# Patient Record
Sex: Female | Born: 1935 | Race: White | Hispanic: No | Smoking: Former smoker
Health system: Southern US, Community
[De-identification: ages and names within clinical notes are randomized; demographics above are authoritative.]

## PROBLEM LIST (undated history)

## (undated) DIAGNOSIS — I82409 Acute embolism and thrombosis of unspecified deep veins of unspecified lower extremity: Secondary | ICD-10-CM

## (undated) DIAGNOSIS — I10 Essential (primary) hypertension: Secondary | ICD-10-CM

## (undated) DIAGNOSIS — J449 Chronic obstructive pulmonary disease, unspecified: Secondary | ICD-10-CM

## (undated) DIAGNOSIS — G2581 Restless legs syndrome: Secondary | ICD-10-CM

## (undated) DIAGNOSIS — G5600 Carpal tunnel syndrome, unspecified upper limb: Secondary | ICD-10-CM

## (undated) DIAGNOSIS — K219 Gastro-esophageal reflux disease without esophagitis: Secondary | ICD-10-CM

## (undated) DIAGNOSIS — E785 Hyperlipidemia, unspecified: Secondary | ICD-10-CM

## (undated) HISTORY — PX: ABDOMINAL HYSTERECTOMY: SHX81

## (undated) HISTORY — PX: GALLBLADDER SURGERY: SHX652

## (undated) HISTORY — DX: Essential (primary) hypertension: I10

## (undated) HISTORY — DX: Hyperlipidemia, unspecified: E78.5

---

## 2005-01-26 ENCOUNTER — Encounter (INDEPENDENT_AMBULATORY_CARE_PROVIDER_SITE_OTHER): Payer: Self-pay | Admitting: Specialist

## 2005-01-26 ENCOUNTER — Ambulatory Visit (HOSPITAL_COMMUNITY): Admission: RE | Admit: 2005-01-26 | Discharge: 2005-01-27 | Payer: Self-pay | Admitting: General Surgery

## 2005-10-06 ENCOUNTER — Ambulatory Visit (HOSPITAL_COMMUNITY): Admission: RE | Admit: 2005-10-06 | Discharge: 2005-10-06 | Payer: Self-pay | Admitting: Family Medicine

## 2006-03-29 ENCOUNTER — Encounter: Admission: RE | Admit: 2006-03-29 | Discharge: 2006-04-18 | Payer: Self-pay | Admitting: *Deleted

## 2006-10-07 ENCOUNTER — Other Ambulatory Visit: Admission: RE | Admit: 2006-10-07 | Discharge: 2006-10-07 | Payer: Self-pay | Admitting: *Deleted

## 2006-10-14 ENCOUNTER — Encounter: Admission: RE | Admit: 2006-10-14 | Discharge: 2006-10-14 | Payer: Self-pay | Admitting: *Deleted

## 2007-08-22 ENCOUNTER — Encounter: Admission: RE | Admit: 2007-08-22 | Discharge: 2007-08-22 | Payer: Self-pay | Admitting: Family Medicine

## 2007-10-31 ENCOUNTER — Encounter: Admission: RE | Admit: 2007-10-31 | Discharge: 2007-10-31 | Payer: Self-pay | Admitting: Family Medicine

## 2007-12-15 ENCOUNTER — Encounter: Admission: RE | Admit: 2007-12-15 | Discharge: 2007-12-15 | Payer: Self-pay | Admitting: Family Medicine

## 2008-02-06 ENCOUNTER — Encounter: Admission: RE | Admit: 2008-02-06 | Discharge: 2008-02-06 | Payer: Self-pay | Admitting: Family Medicine

## 2008-03-28 ENCOUNTER — Encounter: Admission: RE | Admit: 2008-03-28 | Discharge: 2008-03-28 | Payer: Self-pay | Admitting: Family Medicine

## 2008-08-16 ENCOUNTER — Encounter: Admission: RE | Admit: 2008-08-16 | Discharge: 2008-08-16 | Payer: Self-pay | Admitting: Family Medicine

## 2008-12-06 ENCOUNTER — Encounter: Admission: RE | Admit: 2008-12-06 | Discharge: 2008-12-06 | Payer: Self-pay | Admitting: Internal Medicine

## 2009-01-09 ENCOUNTER — Encounter: Admission: RE | Admit: 2009-01-09 | Discharge: 2009-01-09 | Payer: Self-pay | Admitting: Family Medicine

## 2010-01-21 ENCOUNTER — Encounter: Admission: RE | Admit: 2010-01-21 | Discharge: 2010-01-21 | Payer: Self-pay | Admitting: *Deleted

## 2010-03-06 ENCOUNTER — Encounter
Admission: RE | Admit: 2010-03-06 | Discharge: 2010-04-10 | Payer: Self-pay | Source: Home / Self Care | Admitting: Family Medicine

## 2010-09-25 NOTE — Op Note (Signed)
NAMEJODYE, SCALI                   ACCOUNT NO.:  [id number]   MEDICAL RECORD NO.:  [id number]          PATIENT TYPE:  OIB   LOCATION:  1604                         FACILITY:  [facility]   PHYSICIAN:  Anselm Pancoast. Weatherly, M.D.DATE OF BIRTH:  07/02/1921   DATE OF PROCEDURE:  01/27/2005  DATE OF DISCHARGE:                                 OPERATIVE REPORT   PREOPERATIVE DIAGNOSIS:  Chronic cholecystitis.   POSTOPERATIVE DIAGNOSIS:  Chronic cholecystitis with stones and also  intermittent volvulus of her gallbladder.   OPERATIONS:  Laparoscopic cholecystectomy with cholangiogram.   SURGEON:  Anselm Pancoast. Zachery Dakins, M.D.   ASSISTANT:  Gabrielle Dare. Janee Morn, M.D.   ANESTHESIA:  General anesthesia.   HISTORY OF PRESENT ILLNESS:  Cassidy Ellison is an 77 year old female, retired  Diplomatic Services operational officer from [employer], she used to be the Sport and exercise psychologist.  I  saw her in the office recently with the following history. She has had  intermittent episodes of what she describes significant right upper quadrant  pain, something would kind of flop over according to her symptoms and these  will then kind of gradually subside.  She has had a hysterectomy years ago,  and she has had recurrent episodes but over the last couple of weeks the  pain has been much more frequent and the worst attack was approximately two  weeks ago when approximately at 1 a.m. she had significant pain.  She was  seen by the physicians and thought possibly to have a kidney stone.  She has  had a kidney stone in the past but the ureterogram showed no evidence of any  abnormalities, but it did show multiple stones within her gallbladder.  There was no ductal dilatation, and for this reason she was sent to my  office.  I saw on January 19, 2005, and at that time she describes the  pain as king of a bloating, kind of a flopping of something in the right  upper quadrant, and then the symptoms would kind of gradually subside, and I  was  thinking that she was having gallbladder though she describes this as  kind of a flopping over, twisting sensation.  Preoperatively her liver  function studies were normal, and I scheduled her for surgery and they had  her scheduled for today.  She has had significant problems with nausea but  she has not actually lost any significant amount of weight.  Her laboratory  studies showed white count of 7700.  Her hematocrit was 39, and her liver  function studies were normal.  The patient was taken to the operative suite.  She was scheduled earlier today because of acute appendicitis so I postponed  her surgery slightly.   DESCRIPTION OF PROCEDURE:  She was given 3 g of Unasyn approximately two  hours earlier when she was originally in the OR schedule time.  She has PAS  stockings, position OR table. Induction general anesthesia and the abdomen  prepped with Betadine surgical scrub solution and draped in a sterile  manner.  A small incision was made below the umbilicus and sharp  dissection  down through the skin and subcutaneous tissue.  She is a little thicker than  she actually grossly appears, and the fascia was picked up between two  Kochers and a small opening made.  I used a Tresa Endo go into the peritoneal  cavity.  The Hassan cannula was introduced after a pursestring suture was  placed.  On looking up in the upper abdomen, there was  real white area of  the liver and you really could not see the gallbladder, but the omentum was  adherent over where the gallbladder should be.  There were adhesions in the  right lateral area where she had previous surgery, and I first placed the  upper 10 mm trocar in the subxiphoid area and then we placed one 5 mm  trocar.  Then with these I dropped down the omentum that was very adherent  to the right upper quadrant area so we could get the lateral 5 mL port in.  We then basically were kind of lifting up the liver edge looking for the  gallbladder and  could see this large, fluid-filled something with the  omentum adherent to it, but it did not look like was actually attached to  the liver edge.  Then after were stripping the omentum off of this, we saw  that this definitely is the gallbladder, but appears that actually she has  been getting kind of a volvulus of the gallbladder where it is extremely  distended and is kind of stretched out over the cystic duct and everything.  After this was all straightened out so we could see the anatomy, we could  see the cystic duct and encompassed it with a right angle and then placed a  clip across the distal portion junction of the gallbladder and then a Cook  catheter introduced,  x-ray obtained.  It had a good prompt fill in the  extrahepatic biliary system and good flow into the duodenum.  The catheters  were then removed, and the cystic duct was doubly clipped proximally and  then divided, and then the cystic artery was likewise doubly clipped  proximally, singly distally divided.  It looked like a little mesentery to  the gallbladder.  This was all kind of carefully dissected free, anything  that looked like it could have blood vessels and these were clipped, and  then it was freed up completely, freeing the gallbladder.  The gallbladder  was placed in the EndoCatch bag and then we brought the gallbladder out  through the fascia at the umbilicus.  I then reinserted the Hasson cannula,  and we thoroughly irrigated, aspirated and checked it.  There was no  evidence of any bleeding.  The irrigating fluid was aspirated, and then we  switched the camera to the upper 10 mL port and took two figure-of-eight  sutures in the fascia at the umbilicus, and then anesthetized fascia with  Marcaine.  The 5 mm ports were withdrawn under direct vision, and then the  subcutaneous wounds were closed with 4-0 Vicryl and Benzoin and Steri-Strips  on skin.  We had released the carbon dioxide and withdrew the camera  and withdrew the upper 10 liters port and direct vision.  The patient tolerated  procedure nicely, and we kept her overnight and then released in the a.m.  Hopefully she will have no further episodes of this pain which I think was  probably more of a volvulus type thing of the gallbladder than actually the  typical biliary  colic symptoms.           ______________________________  Anselm Pancoast. Zachery Dakins, M.D.     WJW/MEDQ  D:  01/26/2005  T:  01/27/2005  Job:  [id number]   cc:   Clent Jacks, M.D.  [facility]

## 2011-01-15 ENCOUNTER — Other Ambulatory Visit: Payer: Self-pay | Admitting: Family Medicine

## 2011-01-15 DIAGNOSIS — Z1231 Encounter for screening mammogram for malignant neoplasm of breast: Secondary | ICD-10-CM

## 2011-01-26 ENCOUNTER — Ambulatory Visit: Payer: Self-pay

## 2011-02-01 ENCOUNTER — Ambulatory Visit
Admission: RE | Admit: 2011-02-01 | Discharge: 2011-02-01 | Disposition: A | Discharge status: Home / Self Care | Payer: Medicare Other | Source: Ambulatory Visit | Attending: Family Medicine | Admitting: Family Medicine

## 2011-02-01 DIAGNOSIS — Z1231 Encounter for screening mammogram for malignant neoplasm of breast: Secondary | ICD-10-CM

## 2011-05-26 DIAGNOSIS — E559 Vitamin D deficiency, unspecified: Secondary | ICD-10-CM | POA: Diagnosis not present

## 2011-05-26 DIAGNOSIS — K589 Irritable bowel syndrome without diarrhea: Secondary | ICD-10-CM | POA: Diagnosis not present

## 2011-05-26 DIAGNOSIS — E782 Mixed hyperlipidemia: Secondary | ICD-10-CM | POA: Diagnosis not present

## 2011-05-26 DIAGNOSIS — I10 Essential (primary) hypertension: Secondary | ICD-10-CM | POA: Diagnosis not present

## 2011-05-26 DIAGNOSIS — Z79899 Other long term (current) drug therapy: Secondary | ICD-10-CM | POA: Diagnosis not present

## 2011-05-26 DIAGNOSIS — K219 Gastro-esophageal reflux disease without esophagitis: Secondary | ICD-10-CM | POA: Diagnosis not present

## 2011-06-30 DIAGNOSIS — Q828 Other specified congenital malformations of skin: Secondary | ICD-10-CM | POA: Diagnosis not present

## 2011-06-30 DIAGNOSIS — B351 Tinea unguium: Secondary | ICD-10-CM | POA: Diagnosis not present

## 2011-06-30 DIAGNOSIS — M79609 Pain in unspecified limb: Secondary | ICD-10-CM | POA: Diagnosis not present

## 2011-09-07 DIAGNOSIS — B351 Tinea unguium: Secondary | ICD-10-CM | POA: Diagnosis not present

## 2011-09-07 DIAGNOSIS — M79609 Pain in unspecified limb: Secondary | ICD-10-CM | POA: Diagnosis not present

## 2011-09-30 DIAGNOSIS — H01029 Squamous blepharitis unspecified eye, unspecified eyelid: Secondary | ICD-10-CM | POA: Diagnosis not present

## 2011-11-24 DIAGNOSIS — I10 Essential (primary) hypertension: Secondary | ICD-10-CM | POA: Diagnosis not present

## 2011-11-24 DIAGNOSIS — R197 Diarrhea, unspecified: Secondary | ICD-10-CM | POA: Diagnosis not present

## 2011-11-24 DIAGNOSIS — E782 Mixed hyperlipidemia: Secondary | ICD-10-CM | POA: Diagnosis not present

## 2011-11-24 DIAGNOSIS — R319 Hematuria, unspecified: Secondary | ICD-10-CM | POA: Diagnosis not present

## 2011-12-02 DIAGNOSIS — B351 Tinea unguium: Secondary | ICD-10-CM | POA: Diagnosis not present

## 2011-12-02 DIAGNOSIS — M79609 Pain in unspecified limb: Secondary | ICD-10-CM | POA: Diagnosis not present

## 2011-12-28 DIAGNOSIS — T8189XA Other complications of procedures, not elsewhere classified, initial encounter: Secondary | ICD-10-CM | POA: Diagnosis not present

## 2012-02-09 ENCOUNTER — Other Ambulatory Visit (HOSPITAL_COMMUNITY): Payer: Self-pay | Admitting: Family Medicine

## 2012-02-09 DIAGNOSIS — Z1231 Encounter for screening mammogram for malignant neoplasm of breast: Secondary | ICD-10-CM

## 2012-02-22 ENCOUNTER — Ambulatory Visit (HOSPITAL_COMMUNITY): Payer: Medicare Other

## 2012-03-08 ENCOUNTER — Ambulatory Visit (HOSPITAL_COMMUNITY)
Admission: RE | Admit: 2012-03-08 | Discharge: 2012-03-08 | Disposition: A | Discharge status: Home / Self Care | Payer: Medicare Other | Source: Ambulatory Visit | Attending: Family Medicine | Admitting: Family Medicine

## 2012-03-08 DIAGNOSIS — Z1231 Encounter for screening mammogram for malignant neoplasm of breast: Secondary | ICD-10-CM | POA: Insufficient documentation

## 2012-03-17 DIAGNOSIS — B351 Tinea unguium: Secondary | ICD-10-CM | POA: Diagnosis not present

## 2012-03-17 DIAGNOSIS — M79609 Pain in unspecified limb: Secondary | ICD-10-CM | POA: Diagnosis not present

## 2012-03-21 DIAGNOSIS — Z23 Encounter for immunization: Secondary | ICD-10-CM | POA: Diagnosis not present

## 2012-04-03 DIAGNOSIS — L408 Other psoriasis: Secondary | ICD-10-CM | POA: Diagnosis not present

## 2012-04-03 DIAGNOSIS — L821 Other seborrheic keratosis: Secondary | ICD-10-CM | POA: Diagnosis not present

## 2012-04-25 DIAGNOSIS — Z79899 Other long term (current) drug therapy: Secondary | ICD-10-CM | POA: Diagnosis not present

## 2012-04-25 DIAGNOSIS — I10 Essential (primary) hypertension: Secondary | ICD-10-CM | POA: Diagnosis not present

## 2012-04-25 DIAGNOSIS — E559 Vitamin D deficiency, unspecified: Secondary | ICD-10-CM | POA: Diagnosis not present

## 2012-04-25 DIAGNOSIS — L659 Nonscarring hair loss, unspecified: Secondary | ICD-10-CM | POA: Diagnosis not present

## 2012-04-25 DIAGNOSIS — K589 Irritable bowel syndrome without diarrhea: Secondary | ICD-10-CM | POA: Diagnosis not present

## 2012-04-25 DIAGNOSIS — Z23 Encounter for immunization: Secondary | ICD-10-CM | POA: Diagnosis not present

## 2012-04-25 DIAGNOSIS — E785 Hyperlipidemia, unspecified: Secondary | ICD-10-CM | POA: Diagnosis not present

## 2012-08-09 DIAGNOSIS — M79609 Pain in unspecified limb: Secondary | ICD-10-CM | POA: Diagnosis not present

## 2012-08-09 DIAGNOSIS — B351 Tinea unguium: Secondary | ICD-10-CM | POA: Diagnosis not present

## 2012-10-04 DIAGNOSIS — T8189XA Other complications of procedures, not elsewhere classified, initial encounter: Secondary | ICD-10-CM | POA: Diagnosis not present

## 2012-10-04 DIAGNOSIS — E559 Vitamin D deficiency, unspecified: Secondary | ICD-10-CM | POA: Diagnosis not present

## 2012-10-04 DIAGNOSIS — G459 Transient cerebral ischemic attack, unspecified: Secondary | ICD-10-CM | POA: Diagnosis not present

## 2012-10-04 DIAGNOSIS — E782 Mixed hyperlipidemia: Secondary | ICD-10-CM | POA: Diagnosis not present

## 2012-10-04 DIAGNOSIS — I10 Essential (primary) hypertension: Secondary | ICD-10-CM | POA: Diagnosis not present

## 2012-10-04 DIAGNOSIS — Z79899 Other long term (current) drug therapy: Secondary | ICD-10-CM | POA: Diagnosis not present

## 2012-10-04 DIAGNOSIS — R82998 Other abnormal findings in urine: Secondary | ICD-10-CM | POA: Diagnosis not present

## 2012-10-09 DIAGNOSIS — I789 Disease of capillaries, unspecified: Secondary | ICD-10-CM | POA: Diagnosis not present

## 2012-10-09 DIAGNOSIS — L408 Other psoriasis: Secondary | ICD-10-CM | POA: Diagnosis not present

## 2012-10-09 DIAGNOSIS — L821 Other seborrheic keratosis: Secondary | ICD-10-CM | POA: Diagnosis not present

## 2012-10-09 DIAGNOSIS — L820 Inflamed seborrheic keratosis: Secondary | ICD-10-CM | POA: Diagnosis not present

## 2012-10-17 DIAGNOSIS — N390 Urinary tract infection, site not specified: Secondary | ICD-10-CM | POA: Diagnosis not present

## 2012-11-02 DIAGNOSIS — H04129 Dry eye syndrome of unspecified lacrimal gland: Secondary | ICD-10-CM | POA: Diagnosis not present

## 2012-11-22 DIAGNOSIS — M79609 Pain in unspecified limb: Secondary | ICD-10-CM | POA: Diagnosis not present

## 2012-11-22 DIAGNOSIS — B351 Tinea unguium: Secondary | ICD-10-CM | POA: Diagnosis not present

## 2013-02-07 ENCOUNTER — Other Ambulatory Visit: Payer: Self-pay | Admitting: Family Medicine

## 2013-02-07 DIAGNOSIS — Z1231 Encounter for screening mammogram for malignant neoplasm of breast: Secondary | ICD-10-CM

## 2013-02-07 DIAGNOSIS — E782 Mixed hyperlipidemia: Secondary | ICD-10-CM | POA: Diagnosis not present

## 2013-02-07 DIAGNOSIS — Z23 Encounter for immunization: Secondary | ICD-10-CM | POA: Diagnosis not present

## 2013-02-07 DIAGNOSIS — Z79899 Other long term (current) drug therapy: Secondary | ICD-10-CM | POA: Diagnosis not present

## 2013-02-07 DIAGNOSIS — M8580 Other specified disorders of bone density and structure, unspecified site: Secondary | ICD-10-CM

## 2013-02-07 DIAGNOSIS — I10 Essential (primary) hypertension: Secondary | ICD-10-CM | POA: Diagnosis not present

## 2013-02-07 DIAGNOSIS — M79609 Pain in unspecified limb: Secondary | ICD-10-CM | POA: Diagnosis not present

## 2013-02-16 DIAGNOSIS — M205X9 Other deformities of toe(s) (acquired), unspecified foot: Secondary | ICD-10-CM | POA: Diagnosis not present

## 2013-03-02 ENCOUNTER — Encounter: Payer: Self-pay | Admitting: Podiatrist

## 2013-03-02 ENCOUNTER — Ambulatory Visit (INDEPENDENT_AMBULATORY_CARE_PROVIDER_SITE_OTHER): Payer: Medicare Other | Admitting: Podiatrist

## 2013-03-02 VITALS — BP 107/63 | HR 69 | Resp 12 | Ht 65.0 in | Wt 140.0 lb

## 2013-03-02 DIAGNOSIS — M2040 Other hammer toe(s) (acquired), unspecified foot: Secondary | ICD-10-CM

## 2013-03-02 DIAGNOSIS — M79609 Pain in unspecified limb: Secondary | ICD-10-CM

## 2013-03-02 DIAGNOSIS — R52 Pain, unspecified: Secondary | ICD-10-CM

## 2013-03-02 DIAGNOSIS — B351 Tinea unguium: Secondary | ICD-10-CM | POA: Diagnosis not present

## 2013-03-02 DIAGNOSIS — M2042 Other hammer toe(s) (acquired), left foot: Secondary | ICD-10-CM

## 2013-03-02 NOTE — Progress Notes (Signed)
HPI:  Patient presents today for follow up of foot and nail care. Denies any new complaints today.  Objective:  Patients chart is reviewed.  Neurovascular status unchanged.  Patients nails are thickened, discolored, distrophic, friable and brittle with yellow-brown discoloration. Patient subjectively relates they are painful with shoes and with ambulation.both feet.  Patient has rigidly contracted hammertoe 2nd digit with bunion deformity causing hallux to underlap the 2nd toe  Assessment:  Symptomatic onychomycosis, hammertoe 2nd right   Plan:  Discussed treatment options and alternatives.  Discussed surgery for the second toe on the left foot. In the past we did discuss amputation of the second toe as to completely realign the toe would require a large surgery versus a simple amputation. Also discussed her trying to get some shoes with some flexible,cushioning material on the upper and recommended that she see the folks at the shoe market to see if they can find her shoes are comfortable. May discuss surgical options at next visit if she is interested   The symptomatic toenails were debrided through manual an mechanical means without complication.  Return appointment recommended at routine intervals of 3 months  Marlowe Aschoff DPM

## 2013-03-02 NOTE — Patient Instructions (Signed)
GENERAL FOOT HEALTH INFORMATION:  Moisturize your feet regularly with a cream based lotion such as Cetaphil (Cream) or Eucerin (Cream)- usually available in a tub or crock type of container.  Avoid applying the cream to the toe interspaces themselves to reduce the risk of a fungal infection between the toes.  After showering or bathing be sure to dry well between your toes.    Wear a good fitting shoe-  Running shoe brands I recommend are Brooks, New Balance and Asiics.  Always have a shoe fit specialist help you choose your shoes as there are many "varieties" of shoes and they can find you the best fit.  [employer] sports/ [employer] ([place], [place], [place]), [employer], [employer] ([place]) have trained staff to help you in this process.  The Shoe Market in [place] has a great selection of euro-comfort casual shoes with good comfort and support as well.  Avoid prolonged use of thin ballet type flats, or flip flops.  Everyday use of these shoes can actually cause foot problems or injuries.  Watch your toenails for any signs of infection including drainage, pus redness or swelling along the sides of the toenails.  Soak in epsom salt water and use antibiotic ointment (OTC) if you notice this start to occur.  If the redness does not resolve within 2-3 days, call for an appointment to be seen.  

## 2013-03-22 ENCOUNTER — Ambulatory Visit
Admission: RE | Admit: 2013-03-22 | Discharge: 2013-03-22 | Disposition: A | Discharge status: Home / Self Care | Payer: Medicare Other | Source: Ambulatory Visit | Attending: Family Medicine | Admitting: Family Medicine

## 2013-03-22 DIAGNOSIS — M8580 Other specified disorders of bone density and structure, unspecified site: Secondary | ICD-10-CM

## 2013-03-22 DIAGNOSIS — Z1231 Encounter for screening mammogram for malignant neoplasm of breast: Secondary | ICD-10-CM | POA: Diagnosis not present

## 2013-03-22 DIAGNOSIS — M899 Disorder of bone, unspecified: Secondary | ICD-10-CM | POA: Diagnosis not present

## 2013-05-11 DIAGNOSIS — I10 Essential (primary) hypertension: Secondary | ICD-10-CM | POA: Diagnosis not present

## 2013-05-11 DIAGNOSIS — M2550 Pain in unspecified joint: Secondary | ICD-10-CM | POA: Diagnosis not present

## 2013-05-11 DIAGNOSIS — Z79899 Other long term (current) drug therapy: Secondary | ICD-10-CM | POA: Diagnosis not present

## 2013-05-11 DIAGNOSIS — E782 Mixed hyperlipidemia: Secondary | ICD-10-CM | POA: Diagnosis not present

## 2013-05-11 DIAGNOSIS — M899 Disorder of bone, unspecified: Secondary | ICD-10-CM | POA: Diagnosis not present

## 2013-05-11 DIAGNOSIS — M949 Disorder of cartilage, unspecified: Secondary | ICD-10-CM | POA: Diagnosis not present

## 2013-05-11 DIAGNOSIS — E559 Vitamin D deficiency, unspecified: Secondary | ICD-10-CM | POA: Diagnosis not present

## 2013-06-06 ENCOUNTER — Ambulatory Visit (INDEPENDENT_AMBULATORY_CARE_PROVIDER_SITE_OTHER): Payer: Medicare Other | Admitting: Podiatrist

## 2013-06-06 ENCOUNTER — Encounter: Payer: Self-pay | Admitting: Podiatrist

## 2013-06-06 VITALS — BP 103/57 | HR 76 | Resp 16

## 2013-06-06 DIAGNOSIS — M79609 Pain in unspecified limb: Secondary | ICD-10-CM

## 2013-06-06 DIAGNOSIS — B351 Tinea unguium: Secondary | ICD-10-CM | POA: Diagnosis not present

## 2013-06-06 NOTE — Patient Instructions (Signed)
[employer] -- the website and catalog with lots of good foot care products and shoes-- look for shoes with mesh on the upper for the left 2nd tod

## 2013-06-09 NOTE — Progress Notes (Signed)
HPI: Cassidy Ellison presents today for follow up of foot and nail care. Denies any new complaints today.   Objective: Patients chart is reviewed. Neurovascular status unchanged- pedal pulses are palbable at 2/4 dp and pt bilateral.  Patients nails are thickened, discolored, distrophic, friable and brittle with yellow-brown discoloration. Patient subjectively relates they are painful with shoes and with ambulation.both feet. Patient has rigidly contracted hammertoe 2nd digit with bunion deformity causing hallux to underlap the 2nd toe which continues to be painful   Assessment: Symptomatic onychomycosis, hammertoe 2nd right, severe clinical bunion deformity right.  Plan: Discussed treatment options and alternatives. The symptomatic toenails were debrided through manual an mechanical means without complication. Return appointment recommended at routine intervals of 3 months Reiterated the need for cushioning and accomdative shoes for her bunion and 2nd toe.  At this time these issues are stable.

## 2013-08-22 DIAGNOSIS — G479 Sleep disorder, unspecified: Secondary | ICD-10-CM | POA: Diagnosis not present

## 2013-08-22 DIAGNOSIS — I10 Essential (primary) hypertension: Secondary | ICD-10-CM | POA: Diagnosis not present

## 2013-08-22 DIAGNOSIS — R32 Unspecified urinary incontinence: Secondary | ICD-10-CM | POA: Diagnosis not present

## 2013-08-22 DIAGNOSIS — E782 Mixed hyperlipidemia: Secondary | ICD-10-CM | POA: Diagnosis not present

## 2013-08-22 DIAGNOSIS — Z79899 Other long term (current) drug therapy: Secondary | ICD-10-CM | POA: Diagnosis not present

## 2013-08-22 DIAGNOSIS — G459 Transient cerebral ischemic attack, unspecified: Secondary | ICD-10-CM | POA: Diagnosis not present

## 2013-08-22 DIAGNOSIS — N390 Urinary tract infection, site not specified: Secondary | ICD-10-CM | POA: Diagnosis not present

## 2013-09-04 DIAGNOSIS — N390 Urinary tract infection, site not specified: Secondary | ICD-10-CM | POA: Diagnosis not present

## 2013-09-06 ENCOUNTER — Ambulatory Visit: Payer: Medicare Other | Admitting: Podiatrist

## 2013-09-14 ENCOUNTER — Ambulatory Visit (INDEPENDENT_AMBULATORY_CARE_PROVIDER_SITE_OTHER): Payer: Medicare Other | Admitting: Podiatrist

## 2013-09-14 ENCOUNTER — Encounter: Payer: Self-pay | Admitting: Podiatrist

## 2013-09-14 VITALS — BP 132/67 | HR 66 | Resp 18

## 2013-09-14 DIAGNOSIS — B351 Tinea unguium: Secondary | ICD-10-CM | POA: Diagnosis not present

## 2013-09-14 DIAGNOSIS — M79609 Pain in unspecified limb: Secondary | ICD-10-CM | POA: Diagnosis not present

## 2013-09-14 NOTE — Progress Notes (Signed)
I am here to get my toenails trimmed up and I need my calluses trimmed  HPI:  Patient presents today for follow up of foot and nail care. Denies any new complaints today.  Objective:  Patients chart is reviewed.  Vascular status reveals pedal pulses noted at 2 out of 4 dp and pt bilateral .  Neurological sensation is Normal to [employer] monofilament bilateral.  Patients nails are thickened, discolored, distrophic, friable and brittle with yellow-brown discoloration. Patient subjectively relates they are painful with shoes and with ambulation of bilateral feet.  Assessment:  Symptomatic onychomycosis  Plan:  Discussed treatment options and alternatives.  The symptomatic toenails were debrided through manual an mechanical means without complication.  Return appointment recommended at routine intervals of 3 months    Trudie Buckler, DPM

## 2013-09-21 DIAGNOSIS — N390 Urinary tract infection, site not specified: Secondary | ICD-10-CM | POA: Diagnosis not present

## 2013-12-13 ENCOUNTER — Ambulatory Visit (INDEPENDENT_AMBULATORY_CARE_PROVIDER_SITE_OTHER): Payer: Medicare Other | Admitting: Podiatrist

## 2013-12-13 DIAGNOSIS — M79609 Pain in unspecified limb: Secondary | ICD-10-CM | POA: Diagnosis not present

## 2013-12-13 DIAGNOSIS — B351 Tinea unguium: Secondary | ICD-10-CM | POA: Diagnosis not present

## 2013-12-13 DIAGNOSIS — M79673 Pain in unspecified foot: Secondary | ICD-10-CM

## 2013-12-13 NOTE — Progress Notes (Signed)

## 2014-01-04 DIAGNOSIS — I10 Essential (primary) hypertension: Secondary | ICD-10-CM | POA: Diagnosis not present

## 2014-01-04 DIAGNOSIS — R350 Frequency of micturition: Secondary | ICD-10-CM | POA: Diagnosis not present

## 2014-01-04 DIAGNOSIS — E559 Vitamin D deficiency, unspecified: Secondary | ICD-10-CM | POA: Diagnosis not present

## 2014-01-04 DIAGNOSIS — E785 Hyperlipidemia, unspecified: Secondary | ICD-10-CM | POA: Diagnosis not present

## 2014-01-04 DIAGNOSIS — Z8673 Personal history of transient ischemic attack (TIA), and cerebral infarction without residual deficits: Secondary | ICD-10-CM | POA: Diagnosis not present

## 2014-01-04 DIAGNOSIS — K219 Gastro-esophageal reflux disease without esophagitis: Secondary | ICD-10-CM | POA: Diagnosis not present

## 2014-01-04 DIAGNOSIS — M19049 Primary osteoarthritis, unspecified hand: Secondary | ICD-10-CM | POA: Diagnosis not present

## 2014-01-04 DIAGNOSIS — R209 Unspecified disturbances of skin sensation: Secondary | ICD-10-CM | POA: Diagnosis not present

## 2014-01-21 ENCOUNTER — Other Ambulatory Visit: Payer: Self-pay | Admitting: Dermatology

## 2014-01-21 DIAGNOSIS — C44319 Basal cell carcinoma of skin of other parts of face: Secondary | ICD-10-CM | POA: Diagnosis not present

## 2014-01-21 DIAGNOSIS — L408 Other psoriasis: Secondary | ICD-10-CM | POA: Diagnosis not present

## 2014-01-21 DIAGNOSIS — L218 Other seborrheic dermatitis: Secondary | ICD-10-CM | POA: Diagnosis not present

## 2014-01-21 DIAGNOSIS — L821 Other seborrheic keratosis: Secondary | ICD-10-CM | POA: Diagnosis not present

## 2014-01-21 DIAGNOSIS — L570 Actinic keratosis: Secondary | ICD-10-CM | POA: Diagnosis not present

## 2014-01-21 DIAGNOSIS — D485 Neoplasm of uncertain behavior of skin: Secondary | ICD-10-CM | POA: Diagnosis not present

## 2014-01-30 DIAGNOSIS — H43819 Vitreous degeneration, unspecified eye: Secondary | ICD-10-CM | POA: Diagnosis not present

## 2014-01-30 DIAGNOSIS — Z961 Presence of intraocular lens: Secondary | ICD-10-CM | POA: Diagnosis not present

## 2014-01-30 DIAGNOSIS — H00029 Hordeolum internum unspecified eye, unspecified eyelid: Secondary | ICD-10-CM | POA: Diagnosis not present

## 2014-02-14 ENCOUNTER — Ambulatory Visit: Payer: Medicare Other | Admitting: Podiatrist

## 2014-02-22 ENCOUNTER — Ambulatory Visit (INDEPENDENT_AMBULATORY_CARE_PROVIDER_SITE_OTHER): Payer: Medicare Other | Admitting: Podiatrist

## 2014-02-22 DIAGNOSIS — B351 Tinea unguium: Secondary | ICD-10-CM | POA: Diagnosis not present

## 2014-02-22 DIAGNOSIS — M79676 Pain in unspecified toe(s): Secondary | ICD-10-CM

## 2014-02-25 NOTE — Progress Notes (Signed)
HPI:  Patient presents today for follow up of foot and nail care. Denies any new complaints today.  Objective:  Patients chart is reviewed.  Vascular status reveals pedal pulses noted at 2 out of 4 dp and pt bilateral .  Neurological sensation is intact to [employer] monofilament bilateral.  Patients nails are thickened, discolored, distrophic, friable and brittle with yellow-brown discoloration. Patient subjectively relates they are painful with shoes and with ambulation of bilateral feet.  Assessment:  Symptomatic onychomycosis  Plan:  Discussed treatment options and alternatives.  The symptomatic toenails were debrided through manual an mechanical means without complication.  Return appointment recommended at routine intervals of 3 months

## 2014-03-29 DIAGNOSIS — Z23 Encounter for immunization: Secondary | ICD-10-CM | POA: Diagnosis not present

## 2014-04-11 ENCOUNTER — Other Ambulatory Visit: Payer: Self-pay

## 2014-04-11 DIAGNOSIS — Z1231 Encounter for screening mammogram for malignant neoplasm of breast: Secondary | ICD-10-CM

## 2014-04-25 ENCOUNTER — Ambulatory Visit
Admission: RE | Admit: 2014-04-25 | Discharge: 2014-04-25 | Disposition: A | Discharge status: Home / Self Care | Payer: Medicare Other | Source: Ambulatory Visit

## 2014-04-25 DIAGNOSIS — Z1231 Encounter for screening mammogram for malignant neoplasm of breast: Secondary | ICD-10-CM

## 2014-04-26 ENCOUNTER — Ambulatory Visit: Payer: Medicare Other | Admitting: Podiatrist

## 2014-05-15 ENCOUNTER — Ambulatory Visit (INDEPENDENT_AMBULATORY_CARE_PROVIDER_SITE_OTHER): Payer: Medicare Other | Admitting: Podiatrist

## 2014-05-15 ENCOUNTER — Encounter: Payer: Self-pay | Admitting: Podiatrist

## 2014-05-15 DIAGNOSIS — B351 Tinea unguium: Secondary | ICD-10-CM

## 2014-05-15 DIAGNOSIS — M79676 Pain in unspecified toe(s): Secondary | ICD-10-CM | POA: Diagnosis not present

## 2014-05-15 NOTE — Progress Notes (Signed)
HPI:  Patient presents today for follow up of foot and nail care. Denies any new complaints today.  Objective:  Patients chart is reviewed.  Vascular status reveals pedal pulses noted at 2 out of 4 dp and pt bilateral .  Neurological sensation is intact to [employer] monofilament bilateral.  Patients nails are thickened, discolored, distrophic, friable and brittle with yellow-brown discoloration. Patient subjectively relates they are painful with shoes and with ambulation of bilateral feet.  Assessment:  Symptomatic onychomycosis  Plan:  Discussed treatment options and alternatives.  The symptomatic toenails were debrided through manual an mechanical means without complication.  Return appointment recommended at routine intervals of 3 months

## 2014-07-09 DIAGNOSIS — E782 Mixed hyperlipidemia: Secondary | ICD-10-CM | POA: Diagnosis not present

## 2014-07-09 DIAGNOSIS — Z79899 Other long term (current) drug therapy: Secondary | ICD-10-CM | POA: Diagnosis not present

## 2014-07-09 DIAGNOSIS — Z8673 Personal history of transient ischemic attack (TIA), and cerebral infarction without residual deficits: Secondary | ICD-10-CM | POA: Diagnosis not present

## 2014-07-09 DIAGNOSIS — R200 Anesthesia of skin: Secondary | ICD-10-CM | POA: Diagnosis not present

## 2014-07-09 DIAGNOSIS — M25511 Pain in right shoulder: Secondary | ICD-10-CM | POA: Diagnosis not present

## 2014-07-09 DIAGNOSIS — I10 Essential (primary) hypertension: Secondary | ICD-10-CM | POA: Diagnosis not present

## 2014-07-09 DIAGNOSIS — M79642 Pain in left hand: Secondary | ICD-10-CM | POA: Diagnosis not present

## 2014-07-09 DIAGNOSIS — K219 Gastro-esophageal reflux disease without esophagitis: Secondary | ICD-10-CM | POA: Diagnosis not present

## 2014-07-10 ENCOUNTER — Other Ambulatory Visit: Payer: Self-pay | Admitting: Family Medicine

## 2014-07-10 DIAGNOSIS — E785 Hyperlipidemia, unspecified: Secondary | ICD-10-CM

## 2014-07-10 DIAGNOSIS — Z8673 Personal history of transient ischemic attack (TIA), and cerebral infarction without residual deficits: Secondary | ICD-10-CM

## 2014-07-12 ENCOUNTER — Ambulatory Visit
Admission: RE | Admit: 2014-07-12 | Discharge: 2014-07-12 | Disposition: A | Discharge status: Home / Self Care | Payer: Medicare Other | Source: Ambulatory Visit | Attending: Family Medicine | Admitting: Family Medicine

## 2014-07-12 DIAGNOSIS — I6523 Occlusion and stenosis of bilateral carotid arteries: Secondary | ICD-10-CM | POA: Diagnosis not present

## 2014-07-12 DIAGNOSIS — E785 Hyperlipidemia, unspecified: Secondary | ICD-10-CM

## 2014-07-12 DIAGNOSIS — Z8673 Personal history of transient ischemic attack (TIA), and cerebral infarction without residual deficits: Secondary | ICD-10-CM

## 2014-07-17 ENCOUNTER — Ambulatory Visit (INDEPENDENT_AMBULATORY_CARE_PROVIDER_SITE_OTHER): Payer: Medicare Other | Admitting: Podiatrist

## 2014-07-17 ENCOUNTER — Other Ambulatory Visit: Payer: Self-pay | Admitting: Family Medicine

## 2014-07-17 DIAGNOSIS — E041 Nontoxic single thyroid nodule: Secondary | ICD-10-CM

## 2014-07-17 DIAGNOSIS — M19042 Primary osteoarthritis, left hand: Secondary | ICD-10-CM | POA: Diagnosis not present

## 2014-07-17 DIAGNOSIS — B351 Tinea unguium: Secondary | ICD-10-CM

## 2014-07-17 DIAGNOSIS — G5602 Carpal tunnel syndrome, left upper limb: Secondary | ICD-10-CM | POA: Diagnosis not present

## 2014-07-17 DIAGNOSIS — M2042 Other hammer toe(s) (acquired), left foot: Secondary | ICD-10-CM

## 2014-07-17 DIAGNOSIS — M1812 Unilateral primary osteoarthritis of first carpometacarpal joint, left hand: Secondary | ICD-10-CM | POA: Diagnosis not present

## 2014-07-17 DIAGNOSIS — M79676 Pain in unspecified toe(s): Secondary | ICD-10-CM

## 2014-07-17 NOTE — Progress Notes (Signed)
HPI:  Patient presents today for follow up of foot and nail care. Denies any new complaints today.  Objective:  Patients chart is reviewed.  Vascular status reveals pedal pulses noted at 2 out of 4 dp and pt bilateral .  Neurological sensation is intact to [employer] monofilament bilateral.  Patients nails are thickened, discolored, distrophic, friable and brittle with yellow-brown discoloration. Patient subjectively relates they are painful with shoes and with ambulation of bilateral feet.  Assessment:  Symptomatic onychomycosis  Plan:  Discussed treatment options and alternatives.  The symptomatic toenails were debrided through manual an mechanical means without complication.  Return appointment recommended at routine intervals of 3 months

## 2014-07-22 ENCOUNTER — Ambulatory Visit
Admission: RE | Admit: 2014-07-22 | Discharge: 2014-07-22 | Disposition: A | Discharge status: Home / Self Care | Payer: Medicare Other | Source: Ambulatory Visit | Attending: Family Medicine | Admitting: Family Medicine

## 2014-07-22 DIAGNOSIS — E042 Nontoxic multinodular goiter: Secondary | ICD-10-CM | POA: Diagnosis not present

## 2014-07-22 DIAGNOSIS — E041 Nontoxic single thyroid nodule: Secondary | ICD-10-CM

## 2014-07-23 DIAGNOSIS — Z79899 Other long term (current) drug therapy: Secondary | ICD-10-CM | POA: Diagnosis not present

## 2014-07-23 DIAGNOSIS — Z8673 Personal history of transient ischemic attack (TIA), and cerebral infarction without residual deficits: Secondary | ICD-10-CM | POA: Diagnosis not present

## 2014-07-23 DIAGNOSIS — E782 Mixed hyperlipidemia: Secondary | ICD-10-CM | POA: Diagnosis not present

## 2014-07-23 DIAGNOSIS — M79642 Pain in left hand: Secondary | ICD-10-CM | POA: Diagnosis not present

## 2014-07-31 DIAGNOSIS — G5601 Carpal tunnel syndrome, right upper limb: Secondary | ICD-10-CM | POA: Diagnosis not present

## 2014-07-31 DIAGNOSIS — G5602 Carpal tunnel syndrome, left upper limb: Secondary | ICD-10-CM | POA: Diagnosis not present

## 2014-07-31 DIAGNOSIS — M1811 Unilateral primary osteoarthritis of first carpometacarpal joint, right hand: Secondary | ICD-10-CM | POA: Diagnosis not present

## 2014-07-31 DIAGNOSIS — S46011A Strain of muscle(s) and tendon(s) of the rotator cuff of right shoulder, initial encounter: Secondary | ICD-10-CM | POA: Diagnosis not present

## 2014-07-31 DIAGNOSIS — M1812 Unilateral primary osteoarthritis of first carpometacarpal joint, left hand: Secondary | ICD-10-CM | POA: Diagnosis not present

## 2014-08-07 DIAGNOSIS — M7501 Adhesive capsulitis of right shoulder: Secondary | ICD-10-CM | POA: Diagnosis not present

## 2014-08-07 DIAGNOSIS — M79621 Pain in right upper arm: Secondary | ICD-10-CM | POA: Diagnosis not present

## 2014-08-07 DIAGNOSIS — M25611 Stiffness of right shoulder, not elsewhere classified: Secondary | ICD-10-CM | POA: Diagnosis not present

## 2014-08-07 DIAGNOSIS — M25511 Pain in right shoulder: Secondary | ICD-10-CM | POA: Diagnosis not present

## 2014-08-14 DIAGNOSIS — M79621 Pain in right upper arm: Secondary | ICD-10-CM | POA: Diagnosis not present

## 2014-08-14 DIAGNOSIS — M7501 Adhesive capsulitis of right shoulder: Secondary | ICD-10-CM | POA: Diagnosis not present

## 2014-08-14 DIAGNOSIS — M25611 Stiffness of right shoulder, not elsewhere classified: Secondary | ICD-10-CM | POA: Diagnosis not present

## 2014-08-14 DIAGNOSIS — M25511 Pain in right shoulder: Secondary | ICD-10-CM | POA: Diagnosis not present

## 2014-08-21 DIAGNOSIS — M25511 Pain in right shoulder: Secondary | ICD-10-CM | POA: Diagnosis not present

## 2014-08-21 DIAGNOSIS — M79621 Pain in right upper arm: Secondary | ICD-10-CM | POA: Diagnosis not present

## 2014-08-21 DIAGNOSIS — M25611 Stiffness of right shoulder, not elsewhere classified: Secondary | ICD-10-CM | POA: Diagnosis not present

## 2014-08-21 DIAGNOSIS — M7501 Adhesive capsulitis of right shoulder: Secondary | ICD-10-CM | POA: Diagnosis not present

## 2014-09-04 DIAGNOSIS — M79621 Pain in right upper arm: Secondary | ICD-10-CM | POA: Diagnosis not present

## 2014-09-04 DIAGNOSIS — M25611 Stiffness of right shoulder, not elsewhere classified: Secondary | ICD-10-CM | POA: Diagnosis not present

## 2014-09-04 DIAGNOSIS — M25511 Pain in right shoulder: Secondary | ICD-10-CM | POA: Diagnosis not present

## 2014-09-04 DIAGNOSIS — M7501 Adhesive capsulitis of right shoulder: Secondary | ICD-10-CM | POA: Diagnosis not present

## 2014-09-11 DIAGNOSIS — M79621 Pain in right upper arm: Secondary | ICD-10-CM | POA: Diagnosis not present

## 2014-09-11 DIAGNOSIS — M25511 Pain in right shoulder: Secondary | ICD-10-CM | POA: Diagnosis not present

## 2014-09-11 DIAGNOSIS — M7501 Adhesive capsulitis of right shoulder: Secondary | ICD-10-CM | POA: Diagnosis not present

## 2014-09-11 DIAGNOSIS — M25611 Stiffness of right shoulder, not elsewhere classified: Secondary | ICD-10-CM | POA: Diagnosis not present

## 2014-09-18 DIAGNOSIS — M7501 Adhesive capsulitis of right shoulder: Secondary | ICD-10-CM | POA: Diagnosis not present

## 2014-09-18 DIAGNOSIS — M79621 Pain in right upper arm: Secondary | ICD-10-CM | POA: Diagnosis not present

## 2014-09-18 DIAGNOSIS — M25511 Pain in right shoulder: Secondary | ICD-10-CM | POA: Diagnosis not present

## 2014-09-18 DIAGNOSIS — M25611 Stiffness of right shoulder, not elsewhere classified: Secondary | ICD-10-CM | POA: Diagnosis not present

## 2014-09-19 ENCOUNTER — Ambulatory Visit: Payer: Medicare Other

## 2014-09-19 DIAGNOSIS — M79676 Pain in unspecified toe(s): Secondary | ICD-10-CM

## 2014-09-19 DIAGNOSIS — B351 Tinea unguium: Secondary | ICD-10-CM

## 2014-09-27 DIAGNOSIS — G5601 Carpal tunnel syndrome, right upper limb: Secondary | ICD-10-CM | POA: Diagnosis not present

## 2014-09-27 DIAGNOSIS — G5602 Carpal tunnel syndrome, left upper limb: Secondary | ICD-10-CM | POA: Diagnosis not present

## 2014-10-18 ENCOUNTER — Other Ambulatory Visit: Payer: Self-pay | Admitting: Orthopedic Surgery

## 2014-10-29 ENCOUNTER — Encounter (HOSPITAL_BASED_OUTPATIENT_CLINIC_OR_DEPARTMENT_OTHER): Payer: Self-pay | Admitting: *Deleted

## 2014-10-30 ENCOUNTER — Encounter (HOSPITAL_BASED_OUTPATIENT_CLINIC_OR_DEPARTMENT_OTHER)
Admission: RE | Admit: 2014-10-30 | Discharge: 2014-10-30 | Disposition: A | Discharge status: Home / Self Care | Payer: Medicare Other | Source: Ambulatory Visit | Attending: Orthopedic Surgery | Admitting: Orthopedic Surgery

## 2014-10-30 ENCOUNTER — Other Ambulatory Visit: Payer: Self-pay

## 2014-10-30 DIAGNOSIS — I10 Essential (primary) hypertension: Secondary | ICD-10-CM | POA: Diagnosis not present

## 2014-10-30 DIAGNOSIS — G5602 Carpal tunnel syndrome, left upper limb: Secondary | ICD-10-CM | POA: Diagnosis present

## 2014-10-30 DIAGNOSIS — Z87891 Personal history of nicotine dependence: Secondary | ICD-10-CM | POA: Diagnosis not present

## 2014-10-30 DIAGNOSIS — Z79899 Other long term (current) drug therapy: Secondary | ICD-10-CM | POA: Diagnosis not present

## 2014-10-30 DIAGNOSIS — Z7982 Long term (current) use of aspirin: Secondary | ICD-10-CM | POA: Diagnosis not present

## 2014-10-30 DIAGNOSIS — K219 Gastro-esophageal reflux disease without esophagitis: Secondary | ICD-10-CM | POA: Diagnosis not present

## 2014-10-30 LAB — BASIC METABOLIC PANEL
ANION GAP: 8 (ref 5–15)
BUN: 16 mg/dL (ref 6–20)
CALCIUM: 10 mg/dL (ref 8.9–10.3)
CO2: 27 mmol/L (ref 22–32)
Chloride: 97 mmol/L — ABNORMAL LOW (ref 101–111)
Creatinine, Ser: 0.88 mg/dL (ref 0.44–1.00)
GFR calc non Af Amer: 56 mL/min — ABNORMAL LOW (ref >60)
Glucose, Bld: 100 mg/dL — ABNORMAL HIGH (ref 65–99)
Potassium: 5.3 mmol/L — ABNORMAL HIGH (ref 3.5–5.1)
SODIUM: 132 mmol/L — AB (ref 135–145)

## 2014-10-31 ENCOUNTER — Encounter (HOSPITAL_BASED_OUTPATIENT_CLINIC_OR_DEPARTMENT_OTHER): Payer: Self-pay

## 2014-10-31 ENCOUNTER — Ambulatory Visit (HOSPITAL_BASED_OUTPATIENT_CLINIC_OR_DEPARTMENT_OTHER): Payer: Medicare Other | Admitting: Anesthesiology

## 2014-10-31 ENCOUNTER — Encounter (HOSPITAL_BASED_OUTPATIENT_CLINIC_OR_DEPARTMENT_OTHER)
Admission: RE | Disposition: A | Discharge status: Home / Self Care | Payer: Self-pay | Source: Ambulatory Visit | Attending: Orthopedic Surgery

## 2014-10-31 ENCOUNTER — Ambulatory Visit (HOSPITAL_BASED_OUTPATIENT_CLINIC_OR_DEPARTMENT_OTHER)
Admission: RE | Admit: 2014-10-31 | Discharge: 2014-10-31 | Disposition: A | Discharge status: Home / Self Care | Payer: Medicare Other | Source: Ambulatory Visit | Attending: Orthopedic Surgery | Admitting: Orthopedic Surgery

## 2014-10-31 DIAGNOSIS — Z7982 Long term (current) use of aspirin: Secondary | ICD-10-CM | POA: Insufficient documentation

## 2014-10-31 DIAGNOSIS — I10 Essential (primary) hypertension: Secondary | ICD-10-CM | POA: Insufficient documentation

## 2014-10-31 DIAGNOSIS — K219 Gastro-esophageal reflux disease without esophagitis: Secondary | ICD-10-CM | POA: Insufficient documentation

## 2014-10-31 DIAGNOSIS — G5602 Carpal tunnel syndrome, left upper limb: Secondary | ICD-10-CM | POA: Diagnosis not present

## 2014-10-31 DIAGNOSIS — Z87891 Personal history of nicotine dependence: Secondary | ICD-10-CM | POA: Insufficient documentation

## 2014-10-31 DIAGNOSIS — Z79899 Other long term (current) drug therapy: Secondary | ICD-10-CM | POA: Insufficient documentation

## 2014-10-31 HISTORY — DX: Gastro-esophageal reflux disease without esophagitis: K21.9

## 2014-10-31 HISTORY — DX: Carpal tunnel syndrome, unspecified upper limb: G56.00

## 2014-10-31 HISTORY — PX: CARPAL TUNNEL RELEASE: SHX101

## 2014-10-31 SURGERY — CARPAL TUNNEL RELEASE
Anesthesia: General | Site: Wrist | Laterality: Left

## 2014-10-31 MED ORDER — CEFAZOLIN SODIUM-DEXTROSE 2-3 GM-% IV SOLR: 2.0000 g | INTRAVENOUS | Status: DC

## 2014-10-31 MED ORDER — ONDANSETRON HCL 4 MG/2ML IJ SOLN
INTRAMUSCULAR | Status: DC | PRN
  Administered 2014-10-31: 4 mg via INTRAVENOUS

## 2014-10-31 MED ORDER — PROPOFOL 10 MG/ML IV BOLUS
INTRAVENOUS | Status: DC | PRN
  Administered 2014-10-31 (×3): 20 mg via INTRAVENOUS

## 2014-10-31 MED ORDER — MIDAZOLAM HCL 2 MG/2ML IJ SOLN
INTRAMUSCULAR | Status: AC
  Filled 2014-10-31: qty 2

## 2014-10-31 MED ORDER — FENTANYL CITRATE (PF) 100 MCG/2ML IJ SOLN
INTRAMUSCULAR | Status: DC | PRN
  Administered 2014-10-31: 100 ug via INTRAVENOUS

## 2014-10-31 MED ORDER — LIDOCAINE HCL (PF) 0.5 % IJ SOLN
INTRAMUSCULAR | Status: DC | PRN
  Administered 2014-10-31: 30 mL via INTRAVENOUS

## 2014-10-31 MED ORDER — SCOPOLAMINE 1 MG/3DAYS TD PT72: 1.0000 | MEDICATED_PATCH | Freq: Once | TRANSDERMAL | Status: DC | PRN

## 2014-10-31 MED ORDER — MIDAZOLAM HCL 2 MG/2ML IJ SOLN: 1.0000 mg | INTRAMUSCULAR | Status: DC | PRN

## 2014-10-31 MED ORDER — BUPIVACAINE HCL (PF) 0.25 % IJ SOLN
INTRAMUSCULAR | Status: DC | PRN
  Administered 2014-10-31: 10 mL

## 2014-10-31 MED ORDER — FENTANYL CITRATE (PF) 100 MCG/2ML IJ SOLN: 50.0000 ug | INTRAMUSCULAR | Status: DC | PRN

## 2014-10-31 MED ORDER — HYDROCODONE-ACETAMINOPHEN 5-325 MG PO TABS: 1.0000 | ORAL_TABLET | Freq: Four times a day (QID) | ORAL | Status: DC | PRN

## 2014-10-31 MED ORDER — CEFAZOLIN SODIUM-DEXTROSE 2-3 GM-% IV SOLR
INTRAVENOUS | Status: AC
  Filled 2014-10-31: qty 50

## 2014-10-31 MED ORDER — BUPIVACAINE HCL (PF) 0.25 % IJ SOLN
INTRAMUSCULAR | Status: AC
  Filled 2014-10-31: qty 30

## 2014-10-31 MED ORDER — LACTATED RINGERS IV SOLN
INTRAVENOUS | Status: DC | PRN
  Administered 2014-10-31: 09:00:00 via INTRAVENOUS

## 2014-10-31 MED ORDER — FENTANYL CITRATE (PF) 100 MCG/2ML IJ SOLN
INTRAMUSCULAR | Status: AC
  Filled 2014-10-31: qty 2

## 2014-10-31 MED ORDER — CHLORHEXIDINE GLUCONATE 4 % EX LIQD: 60.0000 mL | Freq: Once | CUTANEOUS | Status: DC

## 2014-10-31 MED ORDER — LACTATED RINGERS IV SOLN: INTRAVENOUS | Status: DC

## 2014-10-31 MED ORDER — ONDANSETRON HCL 4 MG/2ML IJ SOLN: 4.0000 mg | Freq: Once | INTRAMUSCULAR | Status: DC | PRN

## 2014-10-31 MED ORDER — FENTANYL CITRATE (PF) 100 MCG/2ML IJ SOLN: 25.0000 ug | INTRAMUSCULAR | Status: DC | PRN

## 2014-10-31 MED ORDER — GLYCOPYRROLATE 0.2 MG/ML IJ SOLN: 0.2000 mg | Freq: Once | INTRAMUSCULAR | Status: DC | PRN

## 2014-10-31 SURGICAL SUPPLY — 39 items
BANDAGE ELASTIC 3 VELCRO ST LF (GAUZE/BANDAGES/DRESSINGS) ×3 IMPLANT
BLADE MINI RND TIP GREEN BEAV (BLADE) IMPLANT
BLADE SURG 15 STRL LF DISP TIS (BLADE) ×2 IMPLANT
BLADE SURG 15 STRL SS (BLADE) ×4
BNDG ESMARK 4X9 LF (GAUZE/BANDAGES/DRESSINGS) IMPLANT
BNDG GAUZE ELAST 4 BULKY (GAUZE/BANDAGES/DRESSINGS) ×3 IMPLANT
CHLORAPREP W/TINT 26ML (MISCELLANEOUS) ×3 IMPLANT
CORDS BIPOLAR (ELECTRODE) ×3 IMPLANT
COVER BACK TABLE 60X90IN (DRAPES) ×3 IMPLANT
COVER MAYO STAND STRL (DRAPES) ×3 IMPLANT
CUFF TOURNIQUET SINGLE 18IN (TOURNIQUET CUFF) ×3 IMPLANT
DRAPE EXTREMITY T 121X128X90 (DRAPE) ×3 IMPLANT
DRAPE SURG 17X23 STRL (DRAPES) ×3 IMPLANT
DRSG PAD ABDOMINAL 8X10 ST (GAUZE/BANDAGES/DRESSINGS) ×3 IMPLANT
GAUZE SPONGE 4X4 12PLY STRL (GAUZE/BANDAGES/DRESSINGS) ×3 IMPLANT
GAUZE XEROFORM 1X8 LF (GAUZE/BANDAGES/DRESSINGS) ×3 IMPLANT
GLOVE BIO SURGEON STRL SZ7.5 (GLOVE) ×3 IMPLANT
GLOVE BIO SURGEON STRL SZ8 (GLOVE) ×3 IMPLANT
GLOVE BIOGEL PI IND STRL 7.0 (GLOVE) ×1 IMPLANT
GLOVE BIOGEL PI IND STRL 7.5 (GLOVE) ×1 IMPLANT
GLOVE BIOGEL PI IND STRL 8 (GLOVE) ×2 IMPLANT
GLOVE BIOGEL PI INDICATOR 7.0 (GLOVE) ×2
GLOVE BIOGEL PI INDICATOR 7.5 (GLOVE) ×2
GLOVE BIOGEL PI INDICATOR 8 (GLOVE) ×4
GLOVE SURG SS PI 7.5 STRL IVOR (GLOVE) ×3 IMPLANT
GOWN STRL REUS W/ TWL LRG LVL3 (GOWN DISPOSABLE) ×1 IMPLANT
GOWN STRL REUS W/TWL LRG LVL3 (GOWN DISPOSABLE) ×2
GOWN STRL REUS W/TWL XL LVL3 (GOWN DISPOSABLE) ×3 IMPLANT
NEEDLE HYPO 25X1 1.5 SAFETY (NEEDLE) IMPLANT
NS IRRIG 1000ML POUR BTL (IV SOLUTION) ×3 IMPLANT
PACK BASIN DAY SURGERY FS (CUSTOM PROCEDURE TRAY) ×3 IMPLANT
PADDING CAST ABS 4INX4YD NS (CAST SUPPLIES) ×2
PADDING CAST ABS COTTON 4X4 ST (CAST SUPPLIES) ×1 IMPLANT
STOCKINETTE 4X48 STRL (DRAPES) ×3 IMPLANT
SUT ETHILON 4 0 PS 2 18 (SUTURE) ×3 IMPLANT
SYR BULB 3OZ (MISCELLANEOUS) ×3 IMPLANT
SYR CONTROL 10ML LL (SYRINGE) IMPLANT
TOWEL OR 17X24 6PK STRL BLUE (TOWEL DISPOSABLE) ×6 IMPLANT
UNDERPAD 30X30 (UNDERPADS AND DIAPERS) ×3 IMPLANT

## 2014-10-31 NOTE — Discharge Instructions (Addendum)
[facility] Instructions Hand Surgery  Wound Care: Keep your hand elevated above the level of your heart.  Do not allow it to dangle by your side.  Keep the dressing dry and do not remove it unless your doctor advises you to do so.  He will usually change it at the time of your post-op visit.  Moving your fingers is advised to stimulate circulation but will depend on the site of your surgery.  If you have a splint applied, your doctor will advise you regarding movement.  Activity: Do not drive or operate machinery today.  Rest today and then you may return to your normal activity and work as indicated by your physician.  Diet:  Drink liquids today or eat a light diet.  You may resume a regular diet tomorrow.    General expectations: Pain for two to three days. Fingers may become slightly swollen.  Call your doctor if any of the following occur: Severe pain not relieved by pain medication. Elevated temperature. Dressing soaked with blood. Inability to move fingers. White or bluish color to fingers.    Post Anesthesia Home Care Instructions  Activity: Get plenty of rest for the remainder of the day. A responsible adult should stay with you for 24 hours following the procedure.  For the next 24 hours, DO NOT: -Drive a car -Paediatric nurse -Drink alcoholic beverages -Take any medication unless instructed by your physician -Make any legal decisions or sign important papers.  Meals: Start with liquid foods such as gelatin or soup. Progress to regular foods as tolerated. Avoid greasy, spicy, heavy foods. If nausea and/or vomiting occur, drink only clear liquids until the nausea and/or vomiting subsides. Call your physician if vomiting continues.  Special Instructions/Symptoms: Your throat may feel dry or sore from the anesthesia or the breathing tube placed in your throat during surgery. If this causes discomfort, gargle with warm salt water. The discomfort should disappear within  24 hours.  If you had a scopolamine patch placed behind your ear for the management of post- operative nausea and/or vomiting:  1. The medication in the patch is effective for 72 hours, after which it should be removed.  Wrap patch in a tissue and discard in the trash. Wash hands thoroughly with soap and water. 2. You may remove the patch earlier than 72 hours if you experience unpleasant side effects which may include dry mouth, dizziness or visual disturbances. 3. Avoid touching the patch. Wash your hands with soap and water after contact with the patch.         HAND SURGERY    HOME CARE INSTRUCTIONS    The following instructions have been prepared to help you care for yourself upon your return home today.  Wound Care:  Keep your hand elevated above the level of your heart. Do not allow it to dangle by your side. Keep the dressing dry and do not remove it unless your doctor advises you to do so. He will usually change it at the time of you post-op visit. Moving your fingers is advised to stimulate circulation but will depend on the site of your surgery. Of course, if you have a splint applied your doctor will advise you about movement.  Activity:  Do not drive or operate machinery today. Rest today and then you may return to your normal activity and work as indicated by your physician.  Diet: Drink liquids today or eat a light diet. You may resume a regular diet tomorrow.  General  expectations: Pain for two or three days. Fingers may become slightly swollen.   Unexpected Observations- Call your doctor if any of these occur: Severe pain not relieved by pain medication. Elevated temperature. Dressing soaked with blood. Inability to move fingers. White or bluish color to fingers.

## 2014-10-31 NOTE — Anesthesia Postprocedure Evaluation (Signed)
  Anesthesia Post-op Note  Patient: Cassidy Ellison  Procedure(s) Performed: Procedure(s): LEFT CARPAL TUNNEL RELEASE (Left)  Patient Location: PACU  Anesthesia Type:Bier block  Level of Consciousness: awake, alert  and oriented  Airway and Oxygen Therapy: Patient Spontanous Breathing and Patient connected to nasal cannula oxygen  Post-op Pain: none  Post-op Assessment: Post-op Vital signs reviewed, Patient's Cardiovascular Status Stable, Respiratory Function Stable, Patent Airway and Pain level controlled              Post-op Vital Signs: stable  Last Vitals:  Filed Vitals:   10/31/14 1000  BP: 122/68  Pulse: 63  Temp:   Resp: 16    Complications: No apparent anesthesia complications

## 2014-10-31 NOTE — Transfer of Care (Signed)
Immediate Anesthesia Transfer of Care Note  Patient: Cassidy Ellison  Procedure(s) Performed: Procedure(s): LEFT CARPAL TUNNEL RELEASE (Left)  Patient Location: PACU  Anesthesia Type:MAC and Bier block  Level of Consciousness: awake, alert  and oriented  Airway & Oxygen Therapy: Patient Spontanous Breathing and Patient connected to face mask oxygen  Post-op Assessment: Report given to RN and Post -op Vital signs reviewed and stable  Post vital signs: Reviewed and stable  Last Vitals:  Filed Vitals:   10/31/14 0737  BP: 141/59  Pulse: 65  Temp: 36.6 C  Resp: 18    Complications: No apparent anesthesia complications

## 2014-10-31 NOTE — H&P (Signed)
Cassidy Ellison is an 81 y.o. female.   Chief Complaint: left carpal tunnel syndrome HPI: 81 yo rhd female with 1 year of numbness and pain in left hand.  Nocturnal symptoms that wake her.  Positive nerve conduction studies.  She wishes to have a carpal tunnel release.    Past Medical History  Diagnosis Date  . Hypertension   . GERD (gastroesophageal reflux disease)   . Carpal tunnel syndrome     left    Past Surgical History  Procedure Laterality Date  . Abdominal hysterectomy    . Gallbladder surgery      History reviewed. No pertinent family history. Social History:  reports that she has quit smoking. She does not have any smokeless tobacco history on file. She reports that she does not drink alcohol or use illicit drugs.  Allergies: No Known Allergies  Medications Prior to Admission  Medication Sig Dispense Refill  . amLODipine (NORVASC) 10 MG tablet Take 10 mg by mouth daily.    Marland Kitchen aspirin 81 MG tablet Take 81 mg by mouth daily.    . Calcium Carbonate-Vit D-Min (CALCIUM 1200 PO) Take 1,200 mg by mouth daily.    . Cholecalciferol (VITAMIN D) 2000 UNITS CAPS Take 2,000 Units by mouth daily.    . fish oil-omega-3 fatty acids 1000 MG capsule Take 2,000 mg by mouth daily.    Marland Kitchen losartan (COZAAR) 100 MG tablet Take 100 mg by mouth daily.    . metoprolol (LOPRESSOR) 100 MG tablet Take 100 mg by mouth daily.    Marland Kitchen omeprazole (PRILOSEC) 20 MG capsule Take 20 mg by mouth daily.    . ONE DAILY MULTIPLE VITAMIN PO Take by mouth.    . RED YEAST RICE EXTRACT PO Take 1,200 mg by mouth daily.    . vitamin C (ASCORBIC ACID) 500 MG tablet Take 500 mg by mouth daily.      Results for orders placed or performed during the hospital encounter of 10/31/14 (from the past 48 hour(s))  Basic metabolic panel     Status: Abnormal   Collection Time: 10/30/14  9:15 AM  Result Value Ref Range   Sodium 132 (L) 135 - 145 mmol/L   Potassium 5.3 (H) 3.5 - 5.1 mmol/L   Chloride 97 (L) 101 - 111 mmol/L   CO2  27 22 - 32 mmol/L   Glucose, Bld 100 (H) 65 - 99 mg/dL   BUN 16 6 - 20 mg/dL   Creatinine, Ser 0.88 0.44 - 1.00 mg/dL   Calcium 10.0 8.9 - 10.3 mg/dL   GFR calc non Af Amer 56 (L) >60 mL/min   GFR calc Af Amer >60 >60 mL/min    Comment: (NOTE) The eGFR has been calculated using the CKD EPI equation. This calculation has not been validated in all clinical situations. eGFR's persistently <60 mL/min signify possible Chronic Kidney Disease.    Anion gap 8 5 - 15    No results found.   A comprehensive review of systems was negative except for: Eyes: positive for contacts/glasses Hematologic/lymphatic: positive for easy bruising  Blood pressure 141/59, pulse 65, temperature 97.8 F (36.6 C), temperature source Oral, resp. rate 18, height $RemoveBe'5\' 5"'jmvfkSRcy$  (1.651 m), weight 64.592 kg (142 lb 6.4 oz), SpO2 100 %.  General appearance: alert, cooperative and appears stated age Head: Normocephalic, without obvious abnormality, atraumatic Neck: supple, symmetrical, trachea midline Resp: clear to auscultation bilaterally Cardio: regular rate and rhythm GI: non tender Extremities: intact sensation, though different feeling in  median nerve distribution, and capillary refill all digits.  +epl/fpl/tio. Pulses: 2+ and symmetric Skin: Skin color, texture, turgor normal. No rashes or lesions Neurologic: Grossly normal Incision/Wound: none  Assessment/Plan Left carpal tunnel syndrome.  Non operative and operative treatment options were discussed with the patient and patient wishes to proceed with operative treatment. Risks, benefits, and alternatives of surgery were discussed and the patient agrees with the plan of care.   Damieon Armendariz R 10/31/2014, 8:23 AM

## 2014-10-31 NOTE — Anesthesia Preprocedure Evaluation (Signed)
Anesthesia Evaluation  Patient identified by MRN, date of birth, ID band Patient awake    Reviewed: Allergy & Precautions, NPO status , Patient's Chart, lab work & pertinent test results  Airway Mallampati: II  TM Distance: >3 FB Neck ROM: Full    Dental  (+) Teeth Intact, Dental Advisory Given   Pulmonary former smoker,  breath sounds clear to auscultation        Cardiovascular hypertension, Rhythm:Regular Rate:Normal     Neuro/Psych    GI/Hepatic   Endo/Other    Renal/GU      Musculoskeletal   Abdominal   Peds  Hematology   Anesthesia Other Findings   Reproductive/Obstetrics                             Anesthesia Physical Anesthesia Plan  ASA: III  Anesthesia Plan: MAC and Bier Block   Post-op Pain Management:    Induction: Intravenous  Airway Management Planned: Natural Airway and Simple Face Mask  Additional Equipment:   Intra-op Plan:   Post-operative Plan:   Informed Consent: I have reviewed the patients History and Physical, chart, labs and discussed the procedure including the risks, benefits and alternatives for the proposed anesthesia with the patient or authorized representative who has indicated his/her understanding and acceptance.     Plan Discussed with: CRNA and Anesthesiologist  Anesthesia Plan Comments:         Anesthesia Quick Evaluation

## 2014-10-31 NOTE — Op Note (Signed)
Cassidy Ellison, Cassidy Ellison                   ACCOUNT NO.:  [id number]  MEDICAL RECORD NO.:  [id number]  LOCATION:                                 FACILITY:  PHYSICIAN:  Leanora Cover, MD        DATE OF BIRTH:  [birth year]  DATE OF PROCEDURE:  10/31/2014 DATE OF DISCHARGE:                              OPERATIVE REPORT   PREOPERATIVE DIAGNOSIS:  Left carpal tunnel syndrome.  POSTOPERATIVE DIAGNOSIS:  Left carpal tunnel syndrome.  PROCEDURE:  Left carpal tunnel release.  SURGEON:  Leanora Cover, MD  ASSISTANT:  None.  ANESTHESIA:  Bier block with sedation.  IV FLUIDS:  Per anesthesia flow sheet.  ESTIMATED BLOOD LOSS:  Minimal.  COMPLICATIONS:  None.  SPECIMENS:  None.  TOURNIQUET TIME:  22 minutes.  DISPOSITION:  Stable to PACU.  INDICATIONS:  Cassidy Ellison is a 81 year old female, with pins and needles sensation and pain in the left hand.  It is bothersome to her.  She wakes up at night from it.  She has positive nerve conduction studies. She wished to have a carpal tunnel release.  Risks, benefits, and alternatives of surgery were discussed including risk of blood loss; infection; damage to nerves, vessels, tendons, ligaments, bone; failure of surgery; need for additional surgery; complications with wound healing; continued pain; continued carpal tunnel syndrome; and damage to motor branch.  She voiced understanding of these risks and elected to proceed.  OPERATIVE COURSE:  After being identified preoperatively by myself, the patient and I agreed upon procedure and site of procedure.  Surgical site was marked.  The risks, benefits, and alternatives of surgery were reviewed and she wished to proceed.  Surgical consent had been signed. She was given IV Ancef as preoperative antibiotic prophylaxis.  She was transferred to the operating room and placed on the operating room table in supine position with left upper extremity on an arm board.  Bier block anesthesia was induced by  anesthesiologist.  The left upper extremity was prepped and draped in normal sterile orthopedic fashion. Surgical pause was performed between surgeons, anesthesia, operating staff, and all were in agreement as to procedure and site of procedure. Tourniquet at the proximal aspect of the forearm had been inflated for the Bier block.  Incision was made over the transverse carpal ligament and carried into subcutaneous tissues by spreading technique.  Bipolar electrocautery was used to obtain hemostasis.  The transverse carpal ligament was sharply incised.  It was incised distally first.  Care was taken to ensure complete decompression distally.  It was then incised proximally.  Scissors were used to split the distal aspect of the volar antebrachial fascia.  Finger was placed into the wound to ensure complete decompression, which was the case.  The nerve was examined.  It was flattened and the motor branch was identified and was intact.  The wound was copiously irrigated with sterile saline and was closed with 4- 0 nylon in a horizontal mattress fashion.  It was injected with 9 mL of 0.25% plain Marcaine to aid in postoperative analgesia.  It was then dressed with sterile Xeroform, 4x4s, ABD, and wrapped with Kerlix and Ace bandage.  The tourniquet was deflated at 22 minutes.  The fingertips were pink with brisk capillary refill after deflation of tourniquet. Operative drapes were broken down.  The patient was awoken from anesthesia safely.  She was transferred back to stretcher and taken to PACU in stable condition.  I will see her back in the office in 1 week for postoperative followup.  We will give her Norco 5/325 one p.o. q.6 h. p.r.n. pain, dispensed #30.     Leanora Cover, MD     KK/MEDQ  D:  10/31/2014  T:  10/31/2014  Job:  [id number]

## 2014-10-31 NOTE — Op Note (Signed)
[id number] 

## 2014-10-31 NOTE — Brief Op Note (Signed)
10/31/2014  9:23 AM  PATIENT:  Cassidy Ellison  81 y.o. female  PRE-OPERATIVE DIAGNOSIS:  left carpal tunnel syndrome  POST-OPERATIVE DIAGNOSIS:  left carpal tunnel syndrome  PROCEDURE:  Procedure(s): LEFT CARPAL TUNNEL RELEASE (Left)  SURGEON:  Surgeon(s) and Role:    * Leanora Cover, MD - Primary  PHYSICIAN ASSISTANT:   ASSISTANTS: none   ANESTHESIA:   Bier block with sedatin  EBL:  Total I/O In: 600 [I.V.:600] Out: -   BLOOD ADMINISTERED:none  DRAINS: none   LOCAL MEDICATIONS USED:  MARCAINE     SPECIMEN:  No Specimen  DISPOSITION OF SPECIMEN:  N/A  COUNTS:  YES  TOURNIQUET:   Total Tourniquet Time Documented: Forearm (Left) - 22 minutes Total: Forearm (Left) - 22 minutes   DICTATION: .Other Dictation: Dictation Number [id number]  PLAN OF CARE: Discharge to home after PACU  PATIENT DISPOSITION:  PACU - hemodynamically stable.

## 2014-11-01 ENCOUNTER — Encounter (HOSPITAL_BASED_OUTPATIENT_CLINIC_OR_DEPARTMENT_OTHER): Payer: Self-pay | Admitting: Orthopedic Surgery

## 2014-12-05 NOTE — Progress Notes (Signed)
HPI:  Patient presents today for follow up of foot and nail care. Denies any new complaints today.  Objective:  Patients chart is reviewed.  Vascular status reveals pedal pulses noted at 2 out of 4 dp and pt bilateral .  Neurological sensation is intact to [employer] monofilament bilateral.  Patients nails are thickened, discolored, distrophic, friable and brittle with yellow-brown discoloration. Patient subjectively relates they are painful with shoes and with ambulation of bilateral feet.  Assessment:  Symptomatic onychomycosis  Plan:  Discussed treatment options and alternatives.  The symptomatic toenails were debrided through manual an mechanical means without complication.  Return appointment recommended at routine intervals of 3 months       

## 2014-12-09 DIAGNOSIS — E785 Hyperlipidemia, unspecified: Secondary | ICD-10-CM | POA: Diagnosis not present

## 2014-12-09 DIAGNOSIS — K5900 Constipation, unspecified: Secondary | ICD-10-CM | POA: Diagnosis not present

## 2014-12-09 DIAGNOSIS — R8290 Unspecified abnormal findings in urine: Secondary | ICD-10-CM | POA: Diagnosis not present

## 2014-12-09 DIAGNOSIS — Z79899 Other long term (current) drug therapy: Secondary | ICD-10-CM | POA: Diagnosis not present

## 2014-12-09 DIAGNOSIS — I10 Essential (primary) hypertension: Secondary | ICD-10-CM | POA: Diagnosis not present

## 2014-12-09 DIAGNOSIS — M859 Disorder of bone density and structure, unspecified: Secondary | ICD-10-CM | POA: Diagnosis not present

## 2014-12-17 ENCOUNTER — Encounter: Payer: Self-pay | Admitting: Podiatry

## 2014-12-17 ENCOUNTER — Ambulatory Visit (INDEPENDENT_AMBULATORY_CARE_PROVIDER_SITE_OTHER): Payer: Medicare Other | Admitting: Podiatry

## 2014-12-17 DIAGNOSIS — M79676 Pain in unspecified toe(s): Secondary | ICD-10-CM

## 2014-12-17 DIAGNOSIS — B351 Tinea unguium: Secondary | ICD-10-CM | POA: Diagnosis not present

## 2014-12-17 NOTE — Progress Notes (Signed)
HPI:  Patient presents today for follow up of foot and nail care. Denies any new complaints today.  Objective:  Patients chart is reviewed.  Vascular status reveals pedal pulses noted at 2 out of 4 dp and pt bilateral .  Neurological sensation is intact to [employer] monofilament bilateral.  Patients nails are thickened, discolored, distrophic, friable and brittle with yellow-brown discoloration. Patient subjectively relates they are painful with shoes and with ambulation of bilateral feet. HAV 1st MPJ with overlapping second toe left foot.  Assessment:  Symptomatic onychomycosis  Plan:  Discussed treatment options and alternatives.  The symptomatic toenails were debrided through manual an mechanical means without complication.  Return appointment recommended at routine intervals of 3 months

## 2014-12-18 DIAGNOSIS — M19042 Primary osteoarthritis, left hand: Secondary | ICD-10-CM | POA: Diagnosis not present

## 2014-12-18 DIAGNOSIS — G5602 Carpal tunnel syndrome, left upper limb: Secondary | ICD-10-CM | POA: Diagnosis not present

## 2015-02-19 DIAGNOSIS — Z111 Encounter for screening for respiratory tuberculosis: Secondary | ICD-10-CM | POA: Diagnosis not present

## 2015-02-19 DIAGNOSIS — R0789 Other chest pain: Secondary | ICD-10-CM | POA: Diagnosis not present

## 2015-02-19 DIAGNOSIS — L409 Psoriasis, unspecified: Secondary | ICD-10-CM | POA: Diagnosis not present

## 2015-02-19 DIAGNOSIS — Z23 Encounter for immunization: Secondary | ICD-10-CM | POA: Diagnosis not present

## 2015-02-19 DIAGNOSIS — L03116 Cellulitis of left lower limb: Secondary | ICD-10-CM | POA: Diagnosis not present

## 2015-02-21 DIAGNOSIS — L03116 Cellulitis of left lower limb: Secondary | ICD-10-CM | POA: Diagnosis not present

## 2015-02-24 DIAGNOSIS — L03116 Cellulitis of left lower limb: Secondary | ICD-10-CM | POA: Diagnosis not present

## 2015-02-28 DIAGNOSIS — L03116 Cellulitis of left lower limb: Secondary | ICD-10-CM | POA: Diagnosis not present

## 2015-04-01 ENCOUNTER — Encounter: Payer: Self-pay | Admitting: Podiatry

## 2015-04-01 ENCOUNTER — Ambulatory Visit (INDEPENDENT_AMBULATORY_CARE_PROVIDER_SITE_OTHER): Payer: Medicare Other | Admitting: Podiatry

## 2015-04-01 DIAGNOSIS — B351 Tinea unguium: Secondary | ICD-10-CM | POA: Diagnosis not present

## 2015-04-01 DIAGNOSIS — M79676 Pain in unspecified toe(s): Secondary | ICD-10-CM

## 2015-04-01 DIAGNOSIS — M2042 Other hammer toe(s) (acquired), left foot: Secondary | ICD-10-CM

## 2015-04-01 DIAGNOSIS — L84 Corns and callosities: Secondary | ICD-10-CM | POA: Diagnosis not present

## 2015-04-01 NOTE — Progress Notes (Signed)
Patient ID: Cassidy Ellison, female   DOB: [birth year], 81 y.o.   MRN: [id number] Complaint:  Visit Type: Patient returns to my office for continued preventative foot care services. Complaint: Patient states" my nails have grown long and thick and become painful to walk and wear shoes". The patient presents for preventative foot care services. No changes to ROS.  Callus under the balls of both feet.  Podiatric Exam: Vascular: dorsalis pedis and posterior tibial pulses are palpable bilateral. Capillary return is immediate. Temperature gradient is WNL. Skin turgor WNL  Sensorium: Normal Semmes Weinstein monofilament test. Normal tactile sensation bilaterally. Nail Exam: Pt has thick disfigured discolored nails with subungual debris noted bilateral entire nail hallux through fifth toenails Ulcer Exam: There is no evidence of ulcer or pre-ulcerative changes or infection. Orthopedic Exam: Muscle tone and strength are WNL. No limitations in general ROM. No crepitus or effusions noted. Foot type and digits show no abnormalities. Bony prominences are unremarkable. Skin: No Porokeratosis. No infection or ulcers.  Callus sub1 right foot.  Pinch callus hallux B/L.  Diagnosis:  Onychomycosis, , Pain in right toe, pain in left toes  Treatment & Plan Procedures and Treatment: Consent by patient was obtained for treatment procedures. The patient understood the discussion of treatment and procedures well. All questions were answered thoroughly reviewed. Debridement of mycotic and hypertrophic toenails, 1 through 5 bilateral and clearing of subungual debris. No ulceration, no infection noted.  Debride callus B/L. Return Visit-Office Procedure: Patient instructed to return to the office for a follow up visit 3 months for continued evaluation and treatment.    Gardiner Barefoot DPM

## 2015-04-11 DIAGNOSIS — Z79899 Other long term (current) drug therapy: Secondary | ICD-10-CM | POA: Diagnosis not present

## 2015-04-11 DIAGNOSIS — Z8673 Personal history of transient ischemic attack (TIA), and cerebral infarction without residual deficits: Secondary | ICD-10-CM | POA: Diagnosis not present

## 2015-04-11 DIAGNOSIS — E785 Hyperlipidemia, unspecified: Secondary | ICD-10-CM | POA: Diagnosis not present

## 2015-04-11 DIAGNOSIS — I10 Essential (primary) hypertension: Secondary | ICD-10-CM | POA: Diagnosis not present

## 2015-04-11 DIAGNOSIS — R7301 Impaired fasting glucose: Secondary | ICD-10-CM | POA: Diagnosis not present

## 2015-04-11 DIAGNOSIS — E559 Vitamin D deficiency, unspecified: Secondary | ICD-10-CM | POA: Diagnosis not present

## 2015-04-11 DIAGNOSIS — E871 Hypo-osmolality and hyponatremia: Secondary | ICD-10-CM | POA: Diagnosis not present

## 2015-04-11 DIAGNOSIS — K5900 Constipation, unspecified: Secondary | ICD-10-CM | POA: Diagnosis not present

## 2015-04-11 DIAGNOSIS — M8588 Other specified disorders of bone density and structure, other site: Secondary | ICD-10-CM | POA: Diagnosis not present

## 2015-04-30 DIAGNOSIS — H04123 Dry eye syndrome of bilateral lacrimal glands: Secondary | ICD-10-CM | POA: Diagnosis not present

## 2015-04-30 DIAGNOSIS — H524 Presbyopia: Secondary | ICD-10-CM | POA: Diagnosis not present

## 2015-05-11 ENCOUNTER — Inpatient Hospital Stay (HOSPITAL_COMMUNITY)
Admission: EM | Admit: 2015-05-11 | Discharge: 2015-05-13 | DRG: 392 | Disposition: A | Discharge status: Home Health | Payer: Medicare Other | Attending: Internal Medicine | Admitting: Internal Medicine

## 2015-05-11 ENCOUNTER — Emergency Department (HOSPITAL_COMMUNITY): Payer: Medicare Other

## 2015-05-11 ENCOUNTER — Encounter (HOSPITAL_COMMUNITY): Payer: Self-pay

## 2015-05-11 DIAGNOSIS — N390 Urinary tract infection, site not specified: Secondary | ICD-10-CM | POA: Diagnosis not present

## 2015-05-11 DIAGNOSIS — L409 Psoriasis, unspecified: Secondary | ICD-10-CM | POA: Diagnosis present

## 2015-05-11 DIAGNOSIS — Z888 Allergy status to other drugs, medicaments and biological substances status: Secondary | ICD-10-CM

## 2015-05-11 DIAGNOSIS — I10 Essential (primary) hypertension: Secondary | ICD-10-CM | POA: Diagnosis present

## 2015-05-11 DIAGNOSIS — E860 Dehydration: Secondary | ICD-10-CM | POA: Diagnosis present

## 2015-05-11 DIAGNOSIS — J209 Acute bronchitis, unspecified: Secondary | ICD-10-CM | POA: Diagnosis present

## 2015-05-11 DIAGNOSIS — A084 Viral intestinal infection, unspecified: Secondary | ICD-10-CM | POA: Diagnosis not present

## 2015-05-11 DIAGNOSIS — E871 Hypo-osmolality and hyponatremia: Secondary | ICD-10-CM | POA: Insufficient documentation

## 2015-05-11 DIAGNOSIS — K219 Gastro-esophageal reflux disease without esophagitis: Secondary | ICD-10-CM | POA: Diagnosis present

## 2015-05-11 DIAGNOSIS — Z881 Allergy status to other antibiotic agents status: Secondary | ICD-10-CM

## 2015-05-11 DIAGNOSIS — Z7982 Long term (current) use of aspirin: Secondary | ICD-10-CM

## 2015-05-11 DIAGNOSIS — R061 Stridor: Secondary | ICD-10-CM | POA: Insufficient documentation

## 2015-05-11 DIAGNOSIS — J069 Acute upper respiratory infection, unspecified: Secondary | ICD-10-CM | POA: Diagnosis present

## 2015-05-11 DIAGNOSIS — D696 Thrombocytopenia, unspecified: Secondary | ICD-10-CM | POA: Diagnosis not present

## 2015-05-11 DIAGNOSIS — Z79899 Other long term (current) drug therapy: Secondary | ICD-10-CM

## 2015-05-11 DIAGNOSIS — E876 Hypokalemia: Secondary | ICD-10-CM | POA: Diagnosis present

## 2015-05-11 DIAGNOSIS — R062 Wheezing: Secondary | ICD-10-CM | POA: Diagnosis not present

## 2015-05-11 DIAGNOSIS — G5602 Carpal tunnel syndrome, left upper limb: Secondary | ICD-10-CM | POA: Diagnosis present

## 2015-05-11 DIAGNOSIS — D72819 Decreased white blood cell count, unspecified: Secondary | ICD-10-CM | POA: Diagnosis present

## 2015-05-11 DIAGNOSIS — G2581 Restless legs syndrome: Secondary | ICD-10-CM | POA: Diagnosis present

## 2015-05-11 DIAGNOSIS — R531 Weakness: Secondary | ICD-10-CM

## 2015-05-11 DIAGNOSIS — Z87891 Personal history of nicotine dependence: Secondary | ICD-10-CM

## 2015-05-11 DIAGNOSIS — R069 Unspecified abnormalities of breathing: Secondary | ICD-10-CM | POA: Diagnosis not present

## 2015-05-11 HISTORY — DX: Acute embolism and thrombosis of unspecified deep veins of unspecified lower extremity: I82.409

## 2015-05-11 LAB — COMPREHENSIVE METABOLIC PANEL
ALT: 18 U/L (ref 14–54)
ANION GAP: 10 (ref 5–15)
AST: 30 U/L (ref 15–41)
Albumin: 3.7 g/dL (ref 3.5–5.0)
Alkaline Phosphatase: 65 U/L (ref 38–126)
BILIRUBIN TOTAL: 0.6 mg/dL (ref 0.3–1.2)
BUN: 10 mg/dL (ref 6–20)
CO2: 22 mmol/L (ref 22–32)
Calcium: 8.6 mg/dL — ABNORMAL LOW (ref 8.9–10.3)
Chloride: 87 mmol/L — ABNORMAL LOW (ref 101–111)
Creatinine, Ser: 0.64 mg/dL (ref 0.44–1.00)
GFR calc Af Amer: >60 mL/min (ref >60)
Glucose, Bld: 129 mg/dL — ABNORMAL HIGH (ref 65–99)
Potassium: 3.5 mmol/L (ref 3.5–5.1)
Sodium: 119 mmol/L — CL (ref 135–145)
TOTAL PROTEIN: 6.8 g/dL (ref 6.5–8.1)

## 2015-05-11 LAB — URINALYSIS, ROUTINE W REFLEX MICROSCOPIC
Bilirubin Urine: NEGATIVE
Glucose, UA: NEGATIVE mg/dL
KETONES UR: NEGATIVE mg/dL
NITRITE: NEGATIVE
Protein, ur: NEGATIVE mg/dL
Specific Gravity, Urine: 1.007 (ref 1.005–1.030)
pH: 6.5 (ref 5.0–8.0)

## 2015-05-11 LAB — LACTIC ACID, PLASMA
LACTIC ACID, VENOUS: 1.8 mmol/L (ref 0.5–2.0)
Lactic Acid, Venous: 2.4 mmol/L (ref 0.5–2.0)

## 2015-05-11 LAB — CBC WITH DIFFERENTIAL/PLATELET
Basophils Absolute: 0 10*3/uL (ref 0.0–0.1)
Basophils Relative: 0 %
Eosinophils Absolute: 0 10*3/uL (ref 0.0–0.7)
Eosinophils Relative: 0 %
HEMATOCRIT: 37.3 % (ref 36.0–46.0)
Hemoglobin: 13.2 g/dL (ref 12.0–15.0)
LYMPHS ABS: 1.5 10*3/uL (ref 0.7–4.0)
LYMPHS PCT: 34 %
MCH: 28.4 pg (ref 26.0–34.0)
MCHC: 35.4 g/dL (ref 30.0–36.0)
MCV: 80.4 fL (ref 78.0–100.0)
MONO ABS: 0.4 10*3/uL (ref 0.1–1.0)
MONOS PCT: 8 %
Neutro Abs: 2.6 10*3/uL (ref 1.7–7.7)
Neutrophils Relative %: 58 %
Platelets: 120 10*3/uL — ABNORMAL LOW (ref 150–400)
RBC: 4.64 MIL/uL (ref 3.87–5.11)
RDW: 12.2 % (ref 11.5–15.5)
WBC: 4.5 10*3/uL (ref 4.0–10.5)

## 2015-05-11 LAB — URINE MICROSCOPIC-ADD ON
RBC / HPF: NONE SEEN RBC/hpf (ref 0–5)
Squamous Epithelial / LPF: NONE SEEN

## 2015-05-11 LAB — MAGNESIUM: MAGNESIUM: 1.4 mg/dL — AB (ref 1.7–2.4)

## 2015-05-11 LAB — PHOSPHORUS: Phosphorus: 2.4 mg/dL — ABNORMAL LOW (ref 2.5–4.6)

## 2015-05-11 MED ORDER — SODIUM CHLORIDE 0.9 % IJ SOLN
3.0000 mL | Freq: Two times a day (BID) | INTRAMUSCULAR | Status: DC
  Administered 2015-05-13: 3 mL via INTRAVENOUS

## 2015-05-11 MED ORDER — LEVOFLOXACIN IN D5W 250 MG/50ML IV SOLN
250.0000 mg | Freq: Once | INTRAVENOUS | Status: AC
  Administered 2015-05-11: 250 mg via INTRAVENOUS
  Filled 2015-05-11: qty 50

## 2015-05-11 MED ORDER — ONDANSETRON HCL 4 MG PO TABS: 4.0000 mg | ORAL_TABLET | Freq: Four times a day (QID) | ORAL | Status: DC | PRN

## 2015-05-11 MED ORDER — METOPROLOL SUCCINATE ER 100 MG PO TB24
100.0000 mg | ORAL_TABLET | Freq: Every day | ORAL | Status: DC
Start: 2015-05-11 — End: 2015-05-13
  Administered 2015-05-11 – 2015-05-13 (×3): 100 mg via ORAL
  Filled 2015-05-11 (×3): qty 1

## 2015-05-11 MED ORDER — LEVOFLOXACIN 250 MG PO TABS
250.0000 mg | ORAL_TABLET | Freq: Every day | ORAL | Status: DC
  Administered 2015-05-12 – 2015-05-13 (×2): 250 mg via ORAL
  Filled 2015-05-11 (×2): qty 1

## 2015-05-11 MED ORDER — PANTOPRAZOLE SODIUM 40 MG PO TBEC
40.0000 mg | DELAYED_RELEASE_TABLET | Freq: Every day | ORAL | Status: DC
  Administered 2015-05-12 – 2015-05-13 (×2): 40 mg via ORAL
  Filled 2015-05-11 (×2): qty 1

## 2015-05-11 MED ORDER — VITAMIN C 500 MG PO TABS
500.0000 mg | ORAL_TABLET | Freq: Every day | ORAL | Status: DC
  Administered 2015-05-12 – 2015-05-13 (×2): 500 mg via ORAL
  Filled 2015-05-11 (×2): qty 1

## 2015-05-11 MED ORDER — GUAIFENESIN-DM 100-10 MG/5ML PO SYRP
5.0000 mL | ORAL_SOLUTION | ORAL | Status: DC | PRN
  Administered 2015-05-12: 5 mL via ORAL
  Filled 2015-05-11: qty 10

## 2015-05-11 MED ORDER — POLYETHYLENE GLYCOL 3350 17 G PO PACK: 17.0000 g | PACK | Freq: Every day | ORAL | Status: DC | PRN

## 2015-05-11 MED ORDER — SODIUM CHLORIDE 0.9 % IV BOLUS (SEPSIS)
500.0000 mL | Freq: Once | INTRAVENOUS | Status: AC
  Administered 2015-05-11: 500 mL via INTRAVENOUS

## 2015-05-11 MED ORDER — POTASSIUM CHLORIDE IN NACL 20-0.9 MEQ/L-% IV SOLN
INTRAVENOUS | Status: DC
  Administered 2015-05-11 – 2015-05-12 (×2): via INTRAVENOUS
  Filled 2015-05-11 (×4): qty 1000

## 2015-05-11 MED ORDER — IPRATROPIUM-ALBUTEROL 0.5-2.5 (3) MG/3ML IN SOLN
3.0000 mL | Freq: Four times a day (QID) | RESPIRATORY_TRACT | Status: DC
  Administered 2015-05-11 – 2015-05-12 (×3): 3 mL via RESPIRATORY_TRACT
  Filled 2015-05-11 (×2): qty 3

## 2015-05-11 MED ORDER — ASPIRIN EC 81 MG PO TBEC
81.0000 mg | DELAYED_RELEASE_TABLET | Freq: Every day | ORAL | Status: DC
Start: 2015-05-12 — End: 2015-05-13
  Administered 2015-05-12 – 2015-05-13 (×2): 81 mg via ORAL
  Filled 2015-05-11 (×2): qty 1

## 2015-05-11 MED ORDER — LOSARTAN POTASSIUM 50 MG PO TABS
100.0000 mg | ORAL_TABLET | Freq: Every day | ORAL | Status: DC
  Administered 2015-05-12 – 2015-05-13 (×2): 100 mg via ORAL
  Filled 2015-05-11 (×2): qty 2

## 2015-05-11 MED ORDER — ONDANSETRON HCL 4 MG/2ML IJ SOLN: 4.0000 mg | Freq: Four times a day (QID) | INTRAMUSCULAR | Status: DC | PRN

## 2015-05-11 MED ORDER — SODIUM CHLORIDE 0.9 % IV SOLN
Freq: Once | INTRAVENOUS | Status: AC
  Administered 2015-05-11: 20:00:00 via INTRAVENOUS

## 2015-05-11 MED ORDER — MAGNESIUM SULFATE 2 GM/50ML IV SOLN
2.0000 g | Freq: Once | INTRAVENOUS | Status: AC
  Administered 2015-05-11: 2 g via INTRAVENOUS
  Filled 2015-05-11: qty 50

## 2015-05-11 MED ORDER — AMLODIPINE BESYLATE 10 MG PO TABS
10.0000 mg | ORAL_TABLET | Freq: Every day | ORAL | Status: DC
  Administered 2015-05-12: 10 mg via ORAL
  Filled 2015-05-11 (×4): qty 1

## 2015-05-11 MED ORDER — IBUPROFEN 200 MG PO TABS: 400.0000 mg | ORAL_TABLET | Freq: Four times a day (QID) | ORAL | Status: DC | PRN

## 2015-05-11 MED ORDER — IPRATROPIUM-ALBUTEROL 0.5-2.5 (3) MG/3ML IN SOLN
RESPIRATORY_TRACT | Status: AC
  Filled 2015-05-11: qty 3

## 2015-05-11 MED ORDER — OMEGA-3-ACID ETHYL ESTERS 1 G PO CAPS
2000.0000 mg | ORAL_CAPSULE | Freq: Every day | ORAL | Status: DC
Start: 2015-05-12 — End: 2015-05-13
  Administered 2015-05-12 – 2015-05-13 (×2): 2000 mg via ORAL
  Filled 2015-05-11 (×2): qty 2

## 2015-05-11 MED ORDER — K PHOS MONO-SOD PHOS DI & MONO 155-852-130 MG PO TABS
500.0000 mg | ORAL_TABLET | Freq: Once | ORAL | Status: AC
  Administered 2015-05-11: 500 mg via ORAL
  Filled 2015-05-11: qty 2

## 2015-05-11 NOTE — ED Notes (Signed)
RN DRAWING LABS FROM IV

## 2015-05-11 NOTE — ED Provider Notes (Addendum)
CSN: [id number]     Arrival date & time 05/11/15  1458 History   First MD Initiated Contact with Patient 05/11/15 1504     Chief Complaint  Patient presents with  . Weakness    SINCE YESTERDAY W/ NVD     (Consider location/radiation/quality/duration/timing/severity/associated sxs/prior Treatment) Patient is a 82 y.o. female presenting with weakness.  Weakness This is a new problem. The current episode started 2 days ago. The problem occurs constantly. The problem has been gradually worsening. Pertinent negatives include no chest pain, no abdominal pain and no shortness of breath. Nothing aggravates the symptoms. Nothing relieves the symptoms. She has tried nothing for the symptoms. The treatment provided no relief.   82 yo F w/ a week of respiratory symptoms that have improved but now with weakness after a day of vomiting and diarrhea. No fever, abdominal pain or other associated symptoms. No exacerbating or relieiving factors. EMS states she had orthostatic changes and thus brought here on a nebulized albuterol treatment.    Past Medical History  Diagnosis Date  . Hypertension   . GERD (gastroesophageal reflux disease)   . Carpal tunnel syndrome     left  . DVT (deep venous thrombosis) [facility])    Past Surgical History  Procedure Laterality Date  . Abdominal hysterectomy    . Gallbladder surgery    . Carpal tunnel release Left 10/31/2014    Procedure: LEFT CARPAL TUNNEL RELEASE;  Surgeon: Leanora Cover, MD;  Location: [facility];  Service: Orthopedics;  Laterality: Left;   History reviewed. No pertinent family history. Social History  Substance Use Topics  . Smoking status: Former Research scientist (life sciences)  . Smokeless tobacco: None     Comment: quit 20years ago  . Alcohol Use: No   OB History    No data available     Review of Systems  Constitutional: Positive for fever and activity change.  Respiratory: Positive for cough. Negative for shortness of breath.   Cardiovascular:  Negative for chest pain and leg swelling.  Gastrointestinal: Positive for nausea, vomiting and diarrhea. Negative for abdominal pain.  Genitourinary: Negative for decreased urine volume.  Musculoskeletal: Negative for neck pain and neck stiffness.  Skin: Negative for rash and wound.  Neurological: Positive for weakness.  All other systems reviewed and are negative.     Allergies  Adhesive; Chlorthalidone; Hctz; Keflex; and Oxsoralen  Home Medications   Prior to Admission medications   Medication Sig Start Date End Date Taking? Authorizing Provider  amLODipine (NORVASC) 10 MG tablet Take 10 mg by mouth daily.   Yes Historical Provider, MD  aspirin 81 MG tablet Take 81 mg by mouth daily.   Yes Historical Provider, MD  Calcium Carbonate-Vit D-Min (CALCIUM 1200 PO) Take 1,200 mg by mouth daily.   Yes Historical Provider, MD  Cholecalciferol (VITAMIN D-3) 1000 units CAPS Take 1,000 Units by mouth daily.   Yes Historical Provider, MD  Dextromethorphan-Guaifenesin 5-100 MG/5ML LIQD Take 5 mLs by mouth every 4 (four) hours as needed (cough).   Yes Historical Provider, MD  fish oil-omega-3 fatty acids 1000 MG capsule Take 2,000 mg by mouth daily.   Yes Historical Provider, MD  ibuprofen (ADVIL,MOTRIN) 200 MG tablet Take 400 mg by mouth every 6 (six) hours as needed for headache or moderate pain.   Yes Historical Provider, MD  Ketotifen Fumarate (ITCHY EYE DROPS OP) Apply 2 drops to eye 2 (two) times daily as needed (itching eye).   Yes Historical Provider, MD  losartan (COZAAR)  100 MG tablet Take 100 mg by mouth daily.   Yes Historical Provider, MD  omeprazole (PRILOSEC) 20 MG capsule Take 20 mg by mouth daily.   Yes Historical Provider, MD  ONE DAILY MULTIPLE VITAMIN PO Take by mouth.   Yes Historical Provider, MD  polyethylene glycol (MIRALAX / GLYCOLAX) packet Take 17 g by mouth daily as needed for mild constipation.   Yes Historical Provider, MD  RED YEAST RICE EXTRACT PO Take 1,200 mg by  mouth daily.   Yes Historical Provider, MD  TOPROL XL 200 MG 24 hr tablet Take 100 mg by mouth daily.  10/23/14  Yes Historical Provider, MD  triamcinolone cream (KENALOG) 0.1 % Apply 1 application topically 2 (two) times daily as needed (itch).  02/19/15  Yes Historical Provider, MD  vitamin C (ASCORBIC ACID) 500 MG tablet Take 500 mg by mouth daily.   Yes Historical Provider, MD   BP 132/67 mmHg  Pulse 80  Temp(Src) 98.1 F (36.7 C) (Oral)  Resp 20  Ht '5\' 5"'$  (1.651 m)  Wt 137 lb 12.6 oz (62.5 kg)  BMI 22.93 kg/m2  SpO2 98% Physical Exam  Constitutional: She is oriented to person, place, and time. She appears well-developed and well-nourished.  HENT:  Head: Normocephalic and atraumatic.  Neck: Normal range of motion.  Cardiovascular: Normal rate and regular rhythm.   Pulmonary/Chest: No stridor. No respiratory distress. She has wheezes (upper lobes).  Abdominal: Soft. She exhibits no distension. There is no tenderness.  Musculoskeletal: Normal range of motion. She exhibits no edema or tenderness.  Neurological: She is alert and oriented to person, place, and time. No cranial nerve deficit.  Skin: Skin is warm and dry. No rash noted. No erythema.  Nursing note and vitals reviewed.   ED Course  Procedures (including critical care time)  CRITICAL CARE Performed by: Merrily Pew   Total critical care time: 35 minutes Critical care time was exclusive of separately billable procedures and treating other patients. Critical care was necessary to treat or prevent imminent or life-threatening deterioration. Critical care was time spent personally by me on the following activities: development of treatment plan with patient and/or surrogate as well as nursing, discussions with consultants, evaluation of patient's response to treatment, examination of patient, obtaining history from patient or surrogate, ordering and performing treatments and interventions, ordering and review of  laboratory studies, ordering and review of radiographic studies, pulse oximetry and re-evaluation of patient's condition.   Labs Review Labs Reviewed  CBC WITH DIFFERENTIAL/PLATELET - Abnormal; Notable for the following:    Platelets 120 (*)    All other components within normal limits  COMPREHENSIVE METABOLIC PANEL - Abnormal; Notable for the following:    Sodium 119 (*)    Chloride 87 (*)    Glucose, Bld 129 (*)    Calcium 8.6 (*)    All other components within normal limits  LACTIC ACID, PLASMA - Abnormal; Notable for the following:    Lactic Acid, Venous 2.4 (*)    All other components within normal limits  URINALYSIS, ROUTINE W REFLEX MICROSCOPIC (NOT AT [facility]) - Abnormal; Notable for the following:    APPearance HAZY (*)    Hgb urine dipstick TRACE (*)    Leukocytes, UA SMALL (*)    All other components within normal limits  URINE MICROSCOPIC-ADD ON - Abnormal; Notable for the following:    Bacteria, UA MANY (*)    All other components within normal limits  MAGNESIUM - Abnormal; Notable for the  following:    Magnesium 1.4 (*)    All other components within normal limits  PHOSPHORUS - Abnormal; Notable for the following:    Phosphorus 2.4 (*)    All other components within normal limits  LACTIC ACID, PLASMA  COMPREHENSIVE METABOLIC PANEL  CBC WITH DIFFERENTIAL/PLATELET  LACTIC ACID, PLASMA    Imaging Review Dg Chest 2 View  05/11/2015  CLINICAL DATA:  Wheezing. EXAM: CHEST  2 VIEW COMPARISON:  08/22/2007 FINDINGS: Heart size and pulmonary vascularity are normal. No infiltrates or effusions. The lungs are slightly hyperinflated. Diffuse degenerative changes in the thoracic spine. IMPRESSION: No active cardiopulmonary disease. Electronically Signed   By: Lorriane Shire M.D.   On: 05/11/2015 16:22   I have personally reviewed and evaluated these images and lab results as part of my medical decision-making.   EKG Interpretation   Date/Time:  Sunday May 11 2015  15:21:00 EST Ventricular Rate:  80 PR Interval:  178 QRS Duration: 97 QT Interval:  380 QTC Calculation: 438 R Axis:   -3 Text Interpretation:  Sinus rhythm Ventricular premature complex Low  voltage, precordial leads Nonspecific T abnormalities, lateral leads  Similar to June 2016 Confirmed by [facility] MD, Corene Cornea [phone]) on 05/11/2015  4:15:03 PM      MDM   Final diagnoses:  Hyponatremia  Weakness    82 yo F here with weakness likely secondary to hyponatremia of uncertain etiology. She had been recently ill with URI. Has had 1 day of vomiting and diarrhea. Certainly repletion began ED however she has significant deficit so will likely need a prolonged infusion.    Merrily Pew, MD 05/11/15 2346  Merrily Pew, MD 05/11/15 [phone]

## 2015-05-11 NOTE — ED Notes (Signed)
Bedside commode at bedside, pt at Xray

## 2015-05-11 NOTE — ED Notes (Signed)
REPORT WILL GIVEN ON THE FLOOR 1412-1. AAOX3. PT IN NO APPARENT DISTRESS OR PAIN. FAMILY AT THE BEDSIDE. IVF NS INFUSING W/O PAIN OR SWELLING. THE OPPORTUNITY TO ASK QUESTIONS WAS PROVIDED.

## 2015-05-11 NOTE — ED Notes (Signed)
PT RECEIVED VIA EMS C/O WEAKNESS W/ N/V/D SINCE YESTERDAY. PT STATES SHE HAD A URI OVER THE CHRISTMAS HOLIDAY. UPON EMS ARRIVAL PT'S BP WAS 120/76 HR 60, UPON STANDING 90/50 HR 70 WITH INABILITY TO WALK. PT ALSO DEVELOPED WHEEZING. PT ARRIVED RECEIVING A RESP TX, AS WELL AS SOLUMEDROL '125MG'$  IV AND ZOFRAN '4MG'$  IV. PT DENIES ANY PAIN AT THIS TIME.

## 2015-05-11 NOTE — H&P (Signed)
[facility] History and Physical  Cassidy Ellison VVO:[id number] DOB: [birth year] DOA: 05/11/2015  Referring physician: Merrily Pew, MD PCP: Antony Blackbird, MD   Chief Complaint: Weakness.  HPI: Cassidy Ellison is a 82 y.o. female with a past medical history of hypertension, mild psoriasis, GERD, DVT, carpal tunnel syndrome (left), who was brought via EMS to the hospital due to generalized weakness, nausea with one episode of vomiting today, diarrhea since yesterday. When EMS arrived on scene, her initial BP was 120/76 mm/Hg, HR of 60 bpm, but had orthostatic changes when standing, 90/50 mm/Hg/, HR of 70 bpm. She was unable to walk today due to weakness. She denied abdominal pain, melena or hematochezia. She denies chest pain, palpitations, diaphoresis, pitting edema of the lower extremities, PND or orthopnea. She complains of mild dyspnea and palpitations.  Per patient, she started having URI symptoms over the Christmas weekend. She had wheezing initially when seen by EMS and received bronchodilators and prednisone 125 mg IV push on route to the emergency department. She has a 30 pack year history of smoking, but quit smoking over 20 years ago. She denies ever having to use inhalers or being treated with nebulizations. He still complains of having mild nasal congestion, cough, mostly nonproductive and mild dyspnea.    When seen in the ED, the patient was in no acute distress. She stated she was feeling better after IV fluids and bronchodilators have been given.  Review of Systems:  Constitutional:  Positive for chills, fatigue.  No weight loss, night sweats, Fevers, HEENT:  Positive nasal congestion.  No headaches, Difficulty swallowing,Tooth/dental problems,Sore throat,  No sneezing, itching, ear ache, post nasal drip,  Cardio-vascular:  As above mentioned. GI:  As above mentioned.  Resp:  Positive mild dyspnea, cough, mostly nonproductive. Positive wheezing, no hemoptysis. Skin:    History of mild psoriasis. GU:  no dysuria, change in color of urine, no urgency or frequency. No flank pain.  Musculoskeletal:  No joint pain or swelling. No decreased range of motion. No back pain.  Psych:  No change in mood or affect. No depression or anxiety. No memory loss.   Past Medical History  Diagnosis Date  . Hypertension   . GERD (gastroesophageal reflux disease)   . Carpal tunnel syndrome     left  . DVT (deep venous thrombosis) [facility])    Past Surgical History  Procedure Laterality Date  . Abdominal hysterectomy    . Gallbladder surgery    . Carpal tunnel release Left 10/31/2014    Procedure: LEFT CARPAL TUNNEL RELEASE;  Surgeon: Leanora Cover, MD;  Location: [facility];  Service: Orthopedics;  Laterality: Left;   Social History:  reports that she has quit smoking. She does not have any smokeless tobacco history on file. She reports that she does not drink alcohol or use illicit drugs.  Allergies  Allergen Reactions  . Chlorthalidone Other (See Comments)    creatinine  increases  . Hctz [Hydrochlorothiazide] Other (See Comments)    CREATNINE INCREASES  . Keflex [Cephalexin]   . Oxsoralen [Methoxsalen]     History reviewed. No pertinent family history.   Prior to Admission medications   Medication Sig Start Date End Date Taking? Authorizing Provider  amLODipine (NORVASC) 10 MG tablet Take 10 mg by mouth daily.   Yes Historical Provider, MD  aspirin 81 MG tablet Take 81 mg by mouth daily.   Yes Historical Provider, MD  Calcium Carbonate-Vit D-Min (CALCIUM 1200 PO) Take 1,200 mg  by mouth daily.   Yes Historical Provider, MD  Cholecalciferol (VITAMIN D-3) 1000 units CAPS Take 1,000 Units by mouth daily.   Yes Historical Provider, MD  Dextromethorphan-Guaifenesin 5-100 MG/5ML LIQD Take 5 mLs by mouth every 4 (four) hours as needed (cough).   Yes Historical Provider, MD  fish oil-omega-3 fatty acids 1000 MG capsule Take 2,000 mg by mouth daily.   Yes  Historical Provider, MD  ibuprofen (ADVIL,MOTRIN) 200 MG tablet Take 400 mg by mouth every 6 (six) hours as needed for headache or moderate pain.   Yes Historical Provider, MD  Ketotifen Fumarate (ITCHY EYE DROPS OP) Apply 2 drops to eye 2 (two) times daily as needed (itching eye).   Yes Historical Provider, MD  losartan (COZAAR) 100 MG tablet Take 100 mg by mouth daily.   Yes Historical Provider, MD  omeprazole (PRILOSEC) 20 MG capsule Take 20 mg by mouth daily.   Yes Historical Provider, MD  ONE DAILY MULTIPLE VITAMIN PO Take by mouth.   Yes Historical Provider, MD  polyethylene glycol (MIRALAX / GLYCOLAX) packet Take 17 g by mouth daily as needed for mild constipation.   Yes Historical Provider, MD  RED YEAST RICE EXTRACT PO Take 1,200 mg by mouth daily.   Yes Historical Provider, MD  TOPROL XL 200 MG 24 hr tablet Take 100 mg by mouth daily.  10/23/14  Yes Historical Provider, MD  triamcinolone cream (KENALOG) 0.1 % Apply 1 application topically 2 (two) times daily as needed (itch).  02/19/15  Yes Historical Provider, MD  vitamin C (ASCORBIC ACID) 500 MG tablet Take 500 mg by mouth daily.   Yes Historical Provider, MD   Physical Exam: Filed Vitals:   05/11/15 1513 05/11/15 1518 05/11/15 1541 05/11/15 1732  BP: 119/61   119/62  Pulse: 75   90  Temp:   97.5 F (36.4 C)   TempSrc:   Axillary   Resp:    23  Height:   '5\' 5"'$  (1.651 m)   Weight:   58.968 kg (130 lb)   SpO2: 100% 99%  92%    Wt Readings from Last 3 Encounters:  05/11/15 58.968 kg (130 lb)  10/31/14 64.592 kg (142 lb 6.4 oz)  03/02/13 63.504 kg (140 lb)    General:  Appears calm and comfortable Eyes: PERRL, normal lids, irises & conjunctiva ENT: grossly normal hearing, lips and oral mucosa are mildly dry.  Neck: no LAD, masses or thyromegaly Cardiovascular: RRR, no m/r/g. No LE edema. Telemetry: SR, no arrhythmias  Respiratory: Bilateral wheezing. No accessory muscle use. Abdomen: soft, ntnd Skin: no rash or  induration seen on limited exam Musculoskeletal: grossly normal tone BUE/BLE Psychiatric: grossly normal mood and affect, speech fluent and appropriate Neurologic: grossly non-focal.          Labs on Admission:  Basic Metabolic Panel:  Recent Labs Lab 05/11/15 1532  NA 119*  K 3.5  CL 87*  CO2 22  GLUCOSE 129*  BUN 10  CREATININE 0.64  CALCIUM 8.6*   Liver Function Tests:  Recent Labs Lab 05/11/15 1532  AST 30  ALT 18  ALKPHOS 65  BILITOT 0.6  PROT 6.8  ALBUMIN 3.7   CBC:  Recent Labs Lab 05/11/15 1532  WBC 4.5  NEUTROABS 2.6  HGB 13.2  HCT 37.3  MCV 80.4  PLT 120*    Radiological Exams on Admission: Dg Chest 2 View  05/11/2015  CLINICAL DATA:  Wheezing. EXAM: CHEST  2 VIEW COMPARISON:  08/22/2007 FINDINGS: Heart  size and pulmonary vascularity are normal. No infiltrates or effusions. The lungs are slightly hyperinflated. Diffuse degenerative changes in the thoracic spine. IMPRESSION: No active cardiopulmonary disease. Electronically Signed   By: Lorriane Shire M.D.   On: 05/11/2015 16:22    EKG: Independently reviewed. Vent. rate 80 BPM PR interval 178 ms QRS duration 97 ms QT/QTc 380/438 ms P-R-T axes 80 -3 91 Sinus rhythm Ventricular premature complex Low voltage, precordial leads Nonspecific T abnormalities, lateral leads No significant changes since June 2016  Assessment/Plan Principal Problem:   Acute hyponatremia Previous sodium level was 132 mmol/L. Likely secondary to GI loses. Continue gentle IV hydration and check electrolytes in the morning.  Active Problems:   Dehydration Continue gentle IV hydration. Gentle potassium supplementation with fluids. Repeat electrolytes and renal function in AM.    UTI (lower urinary tract infection) Start levaquin 250 mg IVP x1, then 250 mg po daily.    Acute bronchitis with bronchospasm. Oxygen supplementation as needed. Bronchodilators as needed.    Hypertension Continue amlodipine 10 mg  po daily Continue losartan 100 mg po daily. Continue metoprolol succinate 100 mg po daily. Monitor blood pressure.     Code Status: Full code. DVT Prophylaxis: her daughter was present in the room.  Family Communication:  Disposition Plan: Admit for IV hydration, electrolyte replacement, symptoms treatment.   Time spent: Over 70 minutes were used during the process of this admission.  Reubin Milan [facility] Pager [phone].

## 2015-05-12 DIAGNOSIS — D72819 Decreased white blood cell count, unspecified: Secondary | ICD-10-CM | POA: Diagnosis present

## 2015-05-12 DIAGNOSIS — J209 Acute bronchitis, unspecified: Secondary | ICD-10-CM

## 2015-05-12 DIAGNOSIS — N390 Urinary tract infection, site not specified: Secondary | ICD-10-CM | POA: Diagnosis not present

## 2015-05-12 DIAGNOSIS — I10 Essential (primary) hypertension: Secondary | ICD-10-CM | POA: Diagnosis present

## 2015-05-12 DIAGNOSIS — D696 Thrombocytopenia, unspecified: Secondary | ICD-10-CM | POA: Diagnosis present

## 2015-05-12 DIAGNOSIS — E860 Dehydration: Secondary | ICD-10-CM

## 2015-05-12 DIAGNOSIS — R531 Weakness: Secondary | ICD-10-CM | POA: Diagnosis not present

## 2015-05-12 DIAGNOSIS — G2581 Restless legs syndrome: Secondary | ICD-10-CM | POA: Diagnosis present

## 2015-05-12 DIAGNOSIS — Z881 Allergy status to other antibiotic agents status: Secondary | ICD-10-CM | POA: Diagnosis not present

## 2015-05-12 DIAGNOSIS — Z7982 Long term (current) use of aspirin: Secondary | ICD-10-CM | POA: Diagnosis not present

## 2015-05-12 DIAGNOSIS — E876 Hypokalemia: Secondary | ICD-10-CM | POA: Diagnosis present

## 2015-05-12 DIAGNOSIS — J069 Acute upper respiratory infection, unspecified: Secondary | ICD-10-CM | POA: Diagnosis present

## 2015-05-12 DIAGNOSIS — Z888 Allergy status to other drugs, medicaments and biological substances status: Secondary | ICD-10-CM | POA: Diagnosis not present

## 2015-05-12 DIAGNOSIS — L409 Psoriasis, unspecified: Secondary | ICD-10-CM | POA: Diagnosis present

## 2015-05-12 DIAGNOSIS — E871 Hypo-osmolality and hyponatremia: Secondary | ICD-10-CM

## 2015-05-12 DIAGNOSIS — Z87891 Personal history of nicotine dependence: Secondary | ICD-10-CM | POA: Diagnosis not present

## 2015-05-12 DIAGNOSIS — G5602 Carpal tunnel syndrome, left upper limb: Secondary | ICD-10-CM | POA: Diagnosis present

## 2015-05-12 DIAGNOSIS — A084 Viral intestinal infection, unspecified: Secondary | ICD-10-CM | POA: Diagnosis present

## 2015-05-12 DIAGNOSIS — K219 Gastro-esophageal reflux disease without esophagitis: Secondary | ICD-10-CM | POA: Diagnosis present

## 2015-05-12 DIAGNOSIS — Z79899 Other long term (current) drug therapy: Secondary | ICD-10-CM | POA: Diagnosis not present

## 2015-05-12 LAB — COMPREHENSIVE METABOLIC PANEL
ALBUMIN: 3.2 g/dL — AB (ref 3.5–5.0)
ALT: 15 U/L (ref 14–54)
ANION GAP: 9 (ref 5–15)
AST: 30 U/L (ref 15–41)
Alkaline Phosphatase: 55 U/L (ref 38–126)
BUN: 8 mg/dL (ref 6–20)
CHLORIDE: 95 mmol/L — AB (ref 101–111)
CO2: 23 mmol/L (ref 22–32)
Calcium: 8.4 mg/dL — ABNORMAL LOW (ref 8.9–10.3)
Creatinine, Ser: 0.7 mg/dL (ref 0.44–1.00)
GFR calc Af Amer: >60 mL/min (ref >60)
GFR calc non Af Amer: >60 mL/min (ref >60)
GLUCOSE: 163 mg/dL — AB (ref 65–99)
POTASSIUM: 3.9 mmol/L (ref 3.5–5.1)
SODIUM: 127 mmol/L — AB (ref 135–145)
Total Bilirubin: 0.4 mg/dL (ref 0.3–1.2)
Total Protein: 6.2 g/dL — ABNORMAL LOW (ref 6.5–8.1)

## 2015-05-12 LAB — CBC WITH DIFFERENTIAL/PLATELET
BASOS ABS: 0 10*3/uL (ref 0.0–0.1)
Basophils Relative: 1 %
Eosinophils Absolute: 0 10*3/uL (ref 0.0–0.7)
Eosinophils Relative: 0 %
HEMATOCRIT: 33.8 % — AB (ref 36.0–46.0)
Hemoglobin: 11.8 g/dL — ABNORMAL LOW (ref 12.0–15.0)
LYMPHS ABS: 0.5 10*3/uL — AB (ref 0.7–4.0)
LYMPHS PCT: 30 %
MCH: 27.9 pg (ref 26.0–34.0)
MCHC: 34.9 g/dL (ref 30.0–36.0)
MCV: 79.9 fL (ref 78.0–100.0)
MONO ABS: 0.1 10*3/uL (ref 0.1–1.0)
MONOS PCT: 8 %
NEUTROS ABS: 1.1 10*3/uL — AB (ref 1.7–7.7)
Neutrophils Relative %: 61 %
Platelets: 122 10*3/uL — ABNORMAL LOW (ref 150–400)
RBC: 4.23 MIL/uL (ref 3.87–5.11)
RDW: 12.2 % (ref 11.5–15.5)
WBC: 1.7 10*3/uL — ABNORMAL LOW (ref 4.0–10.5)

## 2015-05-12 LAB — LACTIC ACID, PLASMA: Lactic Acid, Venous: 1.5 mmol/L (ref 0.5–2.0)

## 2015-05-12 MED ORDER — IPRATROPIUM-ALBUTEROL 0.5-2.5 (3) MG/3ML IN SOLN: 3.0000 mL | Freq: Four times a day (QID) | RESPIRATORY_TRACT | Status: DC | PRN

## 2015-05-12 MED ORDER — ROPINIROLE HCL 0.25 MG PO TABS
0.2500 mg | ORAL_TABLET | Freq: Every day | ORAL | Status: DC
  Administered 2015-05-12: 0.25 mg via ORAL
  Filled 2015-05-12: qty 1

## 2015-05-12 NOTE — Progress Notes (Signed)
[facility] PROGRESS NOTE  Cassidy Ellison VFI:[id number] DOB: [birth year] DOA: 05/11/2015 PCP: Antony Blackbird, MD  Assessment/Plan: 1. N/V/Diarrhea -improving, likely gastroenteritis/viral -supportive care, IVF  2. Hyponatremia -due to dehydration -improving, continue IVF today  3. UTI -continue levaquin, FU cultures  4. Hypokalemia -replaced  5. HTN -continue amlodipine, losartan, metoprolol  6. Leukopenia -baseline unknown, could be due to viral illness or could have MDS, requested labs from PCP's office -CBC in am  DVT proph: SCds due to thrombocytopenia  Code Status: Full Code Family Communication: son at bedside Disposition Plan: home tomorrow if stable   HPI/Subjective: Feels better  Objective: Filed Vitals:   05/12/15 0551 05/12/15 0930  BP: 149/69   Pulse: 79   Temp: 97.8 F (36.6 C)   Resp: 19 18    Intake/Output Summary (Last 24 hours) at 05/12/15 1403 Last data filed at 05/12/15 1000  Gross per 24 hour  Intake    715 ml  Output   2050 ml  Net  -1335 ml   Filed Weights   05/11/15 1541 05/11/15 2030  Weight: 58.968 kg (130 lb) 62.5 kg (137 lb 12.6 oz)    Exam:   General:  AAOx3  Cardiovascular: S1S2/RRR  Respiratory:CTAB  Abdomen: soft, Nt, BS present  Musculoskeletal: no edema c/c   Data Reviewed: Basic Metabolic Panel:  Recent Labs Lab 05/11/15 1532 05/12/15 0512  NA 119* 127*  K 3.5 3.9  CL 87* 95*  CO2 22 23  GLUCOSE 129* 163*  BUN 10 8  CREATININE 0.64 0.70  CALCIUM 8.6* 8.4*  MG 1.4*  --   PHOS 2.4*  --    Liver Function Tests:  Recent Labs Lab 05/11/15 1532 05/12/15 0512  AST 30 30  ALT 18 15  ALKPHOS 65 55  BILITOT 0.6 0.4  PROT 6.8 6.2*  ALBUMIN 3.7 3.2*   No results for input(s): LIPASE, AMYLASE in the last 168 hours. No results for input(s): AMMONIA in the last 168 hours. CBC:  Recent Labs Lab 05/11/15 1532 05/12/15 0512  WBC 4.5 1.7*  NEUTROABS 2.6 1.1*  HGB 13.2 11.8*  HCT 37.3 33.8*   MCV 80.4 79.9  PLT 120* 122*   Cardiac Enzymes: No results for input(s): CKTOTAL, CKMB, CKMBINDEX, TROPONINI in the last 168 hours. BNP (last 3 results) No results for input(s): BNP in the last 8760 hours.  ProBNP (last 3 results) No results for input(s): PROBNP in the last 8760 hours.  CBG: No results for input(s): GLUCAP in the last 168 hours.  No results found for this or any previous visit (from the past 240 hour(s)).   Studies: Dg Chest 2 View  05/11/2015  CLINICAL DATA:  Wheezing. EXAM: CHEST  2 VIEW COMPARISON:  08/22/2007 FINDINGS: Heart size and pulmonary vascularity are normal. No infiltrates or effusions. The lungs are slightly hyperinflated. Diffuse degenerative changes in the thoracic spine. IMPRESSION: No active cardiopulmonary disease. Electronically Signed   By: Lorriane Shire M.D.   On: 05/11/2015 16:22    Scheduled Meds: . amLODipine  10 mg Oral Daily  . aspirin EC  81 mg Oral Daily  . levofloxacin  250 mg Oral Daily  . losartan  100 mg Oral Daily  . metoprolol  100 mg Oral Daily  . omega-3 acid ethyl esters  2,000 mg Oral Daily  . pantoprazole  40 mg Oral Daily  . sodium chloride  3 mL Intravenous Q12H  . vitamin C  500 mg Oral Daily   Continuous Infusions: .  0.9 % NaCl with KCl 20 mEq / L 75 mL/hr at 05/12/15 1107   Antibiotics Given (last 72 hours)    Date/Time Action Medication Dose Rate   05/11/15 2128 Given   Levofloxacin (LEVAQUIN) IVPB 250 mg 250 mg 50 mL/hr   05/12/15 2583 Given   levofloxacin (LEVAQUIN) tablet 250 mg 250 mg       Principal Problem:   Acute hyponatremia Active Problems:   Dehydration   UTI (lower urinary tract infection)   Acute bronchitis with bronchospasm   Hypertension    Time spent: 48mn    Clista Rainford  [facility] Pager [phone] If 7PM-7AM, please contact night-coverage at [email or web address], password [email or web address] System1/06/2015, 2:03 PM

## 2015-05-12 NOTE — Evaluation (Addendum)
Physical Therapy Evaluation Patient Details Name: Cassidy Ellison MRN: [id number] DOB: 09/05/1921 Today's Date: 05/12/2015   History of Present Illness  Cassidy Ellison is a 82 y.o. female with a past medical history of hypertension, mild psoriasis, GERD, DVT, carpal tunnel syndrome (left), who was brought via EMS 05/10/14 to the hospital due to generalized weakness, nausea with one episode of vomiting ,diarrhea. Had orthostatic changes when standing when EMS arrived= 90/50 mm/Hg/, HR of 70 bpm. She was unable to walk due to weakness  Clinical Impression  Pt admitted with above diagnosis. Pt currently with functional limitations due to the deficits listed below (see PT Problem List).  Pt will benefit from skilled PT to increase their independence and safety with mobility to allow discharge to the venue listed below.   The patient is unsteady with gait, is able to steady self with holding onto rail. Recommend close supervision if return to apartment and HHPT> Will assess patient gait with 4 wheeled RW.     Follow Up Recommendations Home health PT;Supervision/Assistance - 24 hour    Equipment Recommendations   (4 wheeled RW)    Recommendations for Other Services OT consult     Precautions / Restrictions Precautions Precautions: Fall Precaution Comments: reports x 2 in recent past.      Mobility  Bed Mobility Overal bed mobility: Needs Assistance Bed Mobility: Supine to Sit     Supine to sit: Min assist     General bed mobility comments: min assist for trunk to sitting upright.  Transfers Overall transfer level: Needs assistance Equipment used: 1 person hand held assist Transfers: Sit to/from Stand Sit to Stand: Min assist         General transfer comment: steady assist  to stand and balance, arms high guard.  Ambulation/Gait Ambulation/Gait assistance: Min assist Ambulation Distance (Feet): 200 Feet Assistive device: 1 person hand held assist Gait Pattern/deviations:  Step-through pattern;Drifts right/left;Staggering right;Staggering left   Gait velocity interpretation: Below normal speed for age/gender General Gait Details: unsteady gait, reaches for wall, rail, objects for balance when not holding with 1 extremety  Stairs            Wheelchair Mobility    Modified Rankin (Stroke Patients Only)       Balance Overall balance assessment: Needs assistance;History of Falls Sitting-balance support: Feet supported;No upper extremity supported Sitting balance-Leahy Scale: Good     Standing balance support: During functional activity;No upper extremity supported Standing balance-Leahy Scale: Poor                               Pertinent Vitals/Pain Pain Assessment: No/denies pain    Home Living Family/patient expects to be discharged to:: Private residence Living Arrangements: Alone Available Help at Discharge: Family Type of Home: Apartment Home Access: Level entry     Home Layout: One level Home Equipment: None      Prior Function Level of Independence: Independent         Comments: was driving until recent illness     Hand Dominance        Extremity/Trunk Assessment   Upper Extremity Assessment: Defer to OT evaluation           Lower Extremity Assessment: Generalized weakness      Cervical / Trunk Assessment: Normal  Communication   Communication: HOH  Cognition Arousal/Alertness: Awake/alert Behavior During Therapy: WFL for tasks assessed/performed Overall Cognitive Status: Within Functional Limits for tasks  assessed                      General Comments      Exercises        Assessment/Plan    PT Assessment Patient needs continued PT services  PT Diagnosis Difficulty walking;Generalized weakness   PT Problem List Decreased strength;Decreased activity tolerance;Decreased balance;Decreased mobility;Decreased safety awareness  PT Treatment Interventions DME instruction;Gait  training;Functional mobility training;Therapeutic activities;Therapeutic exercise;Patient/family education   PT Goals (Current goals can be found in the Care Plan section) Acute Rehab PT Goals Patient Stated Goal: to get back to independence PT Goal Formulation: With patient/family Time For Goal Achievement: 05/26/15 Potential to Achieve Goals: Good    Frequency Min 3X/week   Barriers to discharge Decreased caregiver support      Co-evaluation               End of Session Equipment Utilized During Treatment: Gait belt Activity Tolerance: Patient tolerated treatment well Patient left: in chair;with call bell/phone within reach;with chair alarm set;with family/visitor present Nurse Communication: Mobility status    Functional Assessment Tool Used: clinical judgement Functional Limitation: Mobility: Walking and moving around Mobility: Walking and Moving Around Current Status [phone]): At least 1 percent but less than 20 percent impaired, limited or restricted Mobility: Walking and Moving Around Goal Status [id number]): 0 percent impaired, limited or restricted    Time: 4166-0630 PT Time Calculation (min) (ACUTE ONLY): 25 min   Charges:   PT Evaluation $Initial PT Evaluation Tier I: 1 Procedure     PT G Codes:   PT G-Codes **NOT FOR INPATIENT CLASS** Functional Assessment Tool Used: clinical judgement Functional Limitation: Mobility: Walking and moving around Mobility: Walking and Moving Around Current Status (Z6010): At least 1 percent but less than 20 percent impaired, limited or restricted Mobility: Walking and Moving Around Goal Status [phone]): 0 percent impaired, limited or restricted    Cassidy Ellison 05/12/2015, 12:55 PM Cassidy Ellison PT [phone]

## 2015-05-12 NOTE — Evaluation (Signed)
Occupational Therapy Evaluation Patient Details Name: Cassidy Ellison MRN: [id number] DOB: 1922/02/21 Today's Date: 05/12/2015    History of Present Illness Cassidy Ellison is a 82 y.o. female with a past medical history of hypertension, mild psoriasis, GERD, DVT, carpal tunnel syndrome (left), who was brought via EMS 05/10/14 to the hospital due to generalized weakness, nausea with one episode of vomiting & diarrhea. Had orthostatic changes when standing.   She was unable to walk on day of admission due to weakness   Clinical Impression   Pt was admitted for the above.  She was independent with adls prior to admission.  She will benefit from skilled OT to increase safety and independence with adls, especially with modification to minimize dropping things due to carpal tunnel.  Goals in acute are for supervision level.    Follow Up Recommendations  Home health OT    Equipment Recommendations   (HHOT to further assess shower)    Recommendations for Other Services       Precautions / Restrictions Precautions Precautions: Fall Precaution Comments: reports x 2 in recent past. Restrictions Weight Bearing Restrictions: No      Mobility Bed Mobility Overal bed mobility: Needs Assistance Bed Mobility: Supine to Sit     Supine to sit: Supervision Sit to supine: Supervision     Transfers Overall transfer level: Needs assistance Equipment used: Rolling walker (2 wheeled);4-wheeled walker Transfers: Sit to/from Stand Sit to Stand: Min guard         General transfer comment: cues for UE placement and brakes on 4 wheel walker    Balance Overall balance assessment: Needs assistance;History of Falls Sitting-balance support: Feet supported;No upper extremity supported Sitting balance-Leahy Scale: Good     Standing balance support: During functional activity;No upper extremity supported Standing balance-Leahy Scale: Poor                              ADL Overall ADL's :  Needs assistance/impaired                         Toilet Transfer: Min guard;Ambulation;Comfort height toilet (4 wheel walker)             General ADL Comments: pt needs occasional assistance with ADLs due to dropping items (set up to min A).  Provided built up foam for utensils and toothbrush.  She can cross legs for ADLs.  Pt used 4 wheel walker and RW during OT session.  Pt has a cut out in shower to step in and grab bars but no seat.  Educated to have staff assist her at first, if possible, and to sit on commode to wash feet.  She has a pan of water she uses to soak feet from commode.  Provided therapy ball to squeeze to increase bil hand strength.  Pt may be able to work with soft putty--she does have arthritic changes in her hands     Vision     Perception     Praxis      Pertinent Vitals/Pain Pain Assessment: No/denies pain     Hand Dominance     Extremity/Trunk Assessment Upper Extremity Assessment Upper Extremity Assessment: RUE deficits/detail;LUE deficits/detail RUE Deficits / Details: strength grossly 4/5 however drops things due to Carpal Tunnel; difficulty manipulating LUE Deficits / Details: s/p CTR.  No longer numb but decreased strength/manipulation 4-/5 distally      Cervical / Trunk  Assessment Cervical / Trunk Assessment: Normal   Communication Communication Communication: HOH   Cognition Arousal/Alertness: Awake/alert Behavior During Therapy: WFL for tasks assessed/performed Overall Cognitive Status: Within Functional Limits for tasks assessed                     General Comments       Exercises       Shoulder Instructions      Home Living Family/patient expects to be discharged to:: Private residence Living Arrangements: Alone Available Help at Discharge: Family Type of Home: Apartment Home Access: Level entry     Home Layout: One level     Bathroom Shower/Tub: Walk-in shower         Home Equipment: None    Additional Comments: Pt lives at [facility], independent living since Nov 2016      Prior Functioning/Environment Level of Independence: Independent        Comments: was driving until recent illness    OT Diagnosis: Generalized weakness   OT Problem List: Decreased strength;Decreased activity tolerance;Impaired balance (sitting and/or standing);Decreased knowledge of use of DME or AE;Pain;Decreased coordination   OT Treatment/Interventions: Self-care/ADL training;DME and/or AE instruction;Therapeutic activities;Balance training;Patient/family education    OT Goals(Current goals can be found in the care plan section) Acute Rehab OT Goals Patient Stated Goal: to get back to independence OT Goal Formulation: With patient Time For Goal Achievement: 05/26/15 Potential to Achieve Goals: Good ADL Goals Pt Will Transfer to Toilet: with supervision;ambulating;grab bars (comfort height) Pt Will Perform Toileting - Clothing Manipulation and hygiene: with supervision;sit to/from stand Additional ADL Goal #1: Pt will gather clothes at supervision level using AD Additional ADL Goal #2: Pt will perform level one putty strengthening program with supervision/written HEP  OT Frequency: Min 2X/week   Barriers to D/C:            Co-evaluation              End of Session    Activity Tolerance: Patient tolerated treatment well Patient left: in bed;with call bell/phone within reach;with bed alarm set;with family/visitor present   Time: 0786-7544 OT Time Calculation (min): 33 min Charges:  OT General Charges $OT Visit: 1 Procedure OT Evaluation $Initial OT Evaluation Tier I: 1 Procedure G-Codes: OT G-codes **NOT FOR INPATIENT CLASS** Functional Assessment Tool Used: clinical observation and judgment Functional Limitation: Self care Self Care Current Status (B2010): At least 20 percent but less than 40 percent impaired, limited or restricted Self Care Goal Status (O7121): At least  1 percent but less than 20 percent impaired, limited or restricted  [facility] 05/12/2015, 4:26 PM  Lesle Chris, OTR/L [phone] 05/12/2015

## 2015-05-12 NOTE — Progress Notes (Signed)
Per MD, patient does not need to be placed on neutropenic precautions at this time.

## 2015-05-12 NOTE — Progress Notes (Signed)
CSW received call from PT, Santiago Glad that patient & daughter would like to discuss discharge options. CSW explained to patient that currently she is here under observation status and Medicare would not cover for SNF stay. Patient to return to [facility] with home health PT/OT and rollator. RNCM, Kathy aware.   No further CSW needs identified - CSW signing off.   Raynaldo Opitz, [facility] Clinical Social Worker cell #: [phone]

## 2015-05-12 NOTE — Progress Notes (Signed)
This RN is taking over nursing care of the patient and agrees with the previous RN's assessment. Will continue to monitor.

## 2015-05-13 DIAGNOSIS — R061 Stridor: Secondary | ICD-10-CM | POA: Insufficient documentation

## 2015-05-13 DIAGNOSIS — E871 Hypo-osmolality and hyponatremia: Secondary | ICD-10-CM | POA: Insufficient documentation

## 2015-05-13 DIAGNOSIS — R531 Weakness: Secondary | ICD-10-CM

## 2015-05-13 LAB — CBC WITH DIFFERENTIAL/PLATELET
BASOS ABS: 0 10*3/uL (ref 0.0–0.1)
Basophils Relative: 0 %
Eosinophils Absolute: 0 10*3/uL (ref 0.0–0.7)
Eosinophils Relative: 0 %
HEMATOCRIT: 38.7 % (ref 36.0–46.0)
HEMOGLOBIN: 13 g/dL (ref 12.0–15.0)
LYMPHS PCT: 27 %
Lymphs Abs: 1.4 10*3/uL (ref 0.7–4.0)
MCH: 28.1 pg (ref 26.0–34.0)
MCHC: 33.6 g/dL (ref 30.0–36.0)
MCV: 83.8 fL (ref 78.0–100.0)
MONO ABS: 0.5 10*3/uL (ref 0.1–1.0)
MONOS PCT: 10 %
NEUTROS ABS: 3.2 10*3/uL (ref 1.7–7.7)
NEUTROS PCT: 63 %
Platelets: 153 10*3/uL (ref 150–400)
RBC: 4.62 MIL/uL (ref 3.87–5.11)
RDW: 12.8 % (ref 11.5–15.5)
WBC: 5.1 10*3/uL (ref 4.0–10.5)

## 2015-05-13 LAB — BASIC METABOLIC PANEL
ANION GAP: 6 (ref 5–15)
BUN: 11 mg/dL (ref 6–20)
CO2: 25 mmol/L (ref 22–32)
Calcium: 8.8 mg/dL — ABNORMAL LOW (ref 8.9–10.3)
Chloride: 101 mmol/L (ref 101–111)
Creatinine, Ser: 0.78 mg/dL (ref 0.44–1.00)
GFR calc non Af Amer: >60 mL/min (ref >60)
GLUCOSE: 103 mg/dL — AB (ref 65–99)
POTASSIUM: 3.8 mmol/L (ref 3.5–5.1)
Sodium: 132 mmol/L — ABNORMAL LOW (ref 135–145)

## 2015-05-13 MED ORDER — LEVOFLOXACIN 250 MG PO TABS: 250.0000 mg | ORAL_TABLET | Freq: Every day | ORAL | Status: DC

## 2015-05-13 MED ORDER — ROPINIROLE HCL 0.25 MG PO TABS: 0.2500 mg | ORAL_TABLET | Freq: Every day | ORAL | Status: DC

## 2015-05-13 MED ORDER — ONDANSETRON 4 MG PO TBDP: 4.0000 mg | ORAL_TABLET | Freq: Three times a day (TID) | ORAL | Status: DC | PRN

## 2015-05-13 NOTE — Discharge Summary (Addendum)
Physician Discharge Summary  Cassidy Ellison IRW:[id number] DOB: 07/21/1921 DOA: 05/11/2015  PCP: Antony Blackbird, MD  Admit date: 05/11/2015 Discharge date: 05/13/2015  Time spent: 45 minutes  Recommendations for Outpatient Follow-up:  1. PCP Dr.Fulp in 1 week   Discharge Diagnoses:  Principal Problem:   Acute hyponatremia   Viral gastroenteritis   Dehydration   UTI (lower urinary tract infection)   URI   Hypertension   Hyponatremia   Weakness   Discharge Condition: stable  Diet recommendation: heart healthy  Filed Weights   05/11/15 1541 05/11/15 2030  Weight: 58.968 kg (130 lb) 62.5 kg (137 lb 12.6 oz)    History of present illness:  Chief Complaint: Weakness.  HPI: Cassidy Ellison is a 82 y.o. female with a past medical history of hypertension, mild psoriasis, GERD, DVT, carpal tunnel syndrome (left), who was brought via EMS to the hospital due to generalized weakness, nausea with one episode of vomiting 1/1, diarrhea since 12/31. When EMS arrived on scene, her initial BP was 120/76 mm/Hg, HR of 60 bpm, but had orthostatic changes when standing, 90/50 mm/Hg/, HR of 70 bpm. She was unable to walk due to weakness. She denied abdominal pain, melena or hematochezia.  Per patient, she started having URI symptoms over the Christmas weekend. She had wheezing initially when seen by EMS and received bronchodilators and prednisone 125 mg IV push on route to the emergency department. She has a 30 pack year history of smoking, but quit smoking over 20 years ago  Hospital Course:  1. N/V/Diarrhea -improving, likely gastroenteritis/viral -treated with supportive care, IVF, anti-emetics  2. Hyponatremia -due to dehydration -improved, Na 119 on admission and 132 at discharge today  3. UTI -continue levaquin, Urine cultures pending at discharge  4. Hypokalemia -replaced  5. HTN -continue amlodipine, losartan, metoprolol  6. Restless legs syndrome -started on low dose  requip   Discharge Exam: Filed Vitals:   05/12/15 2031 05/13/15 0419  BP: 127/71 114/66  Pulse: 70 74  Temp: 98.1 F (36.7 C) 97.9 F (36.6 C)  Resp: 18 16    General: AAOx3 Cardiovascular: S1S2/RRR Respiratory: CTAB  Discharge Instructions   Discharge Instructions    Diet - low sodium heart healthy    Complete by:  As directed      Increase activity slowly    Complete by:  As directed           Current Discharge Medication List    START taking these medications   Details  levofloxacin (LEVAQUIN) 250 MG tablet Take 1 tablet (250 mg total) by mouth daily. For 2days Qty: 2 tablet, Refills: 0    ondansetron (ZOFRAN ODT) 4 MG disintegrating tablet Take 1 tablet (4 mg total) by mouth every 8 (eight) hours as needed for nausea or vomiting. Qty: 20 tablet, Refills: 0    rOPINIRole (REQUIP) 0.25 MG tablet Take 1 tablet (0.25 mg total) by mouth at bedtime. Qty: 30 tablet, Refills: 0      CONTINUE these medications which have NOT CHANGED   Details  amLODipine (NORVASC) 10 MG tablet Take 10 mg by mouth daily.    aspirin 81 MG tablet Take 81 mg by mouth daily.    Calcium Carbonate-Vit D-Min (CALCIUM 1200 PO) Take 1,200 mg by mouth daily.    Cholecalciferol (VITAMIN D-3) 1000 units CAPS Take 1,000 Units by mouth daily.    Dextromethorphan-Guaifenesin 5-100 MG/5ML LIQD Take 5 mLs by mouth every 4 (four) hours as needed (cough).  fish oil-omega-3 fatty acids 1000 MG capsule Take 2,000 mg by mouth daily.    Ketotifen Fumarate (ITCHY EYE DROPS OP) Apply 2 drops to eye 2 (two) times daily as needed (itching eye).    losartan (COZAAR) 100 MG tablet Take 100 mg by mouth daily.    omeprazole (PRILOSEC) 20 MG capsule Take 20 mg by mouth daily.    ONE DAILY MULTIPLE VITAMIN PO Take by mouth.    polyethylene glycol (MIRALAX / GLYCOLAX) packet Take 17 g by mouth daily as needed for mild constipation.    RED YEAST RICE EXTRACT PO Take 1,200 mg by mouth daily.    TOPROL XL  200 MG 24 hr tablet Take 100 mg by mouth daily.     triamcinolone cream (KENALOG) 0.1 % Apply 1 application topically 2 (two) times daily as needed (itch).  Refills: 1    vitamin C (ASCORBIC ACID) 500 MG tablet Take 500 mg by mouth daily.      STOP taking these medications     ibuprofen (ADVIL,MOTRIN) 200 MG tablet        Allergies  Allergen Reactions  . Adhesive [Tape] Itching  . Chlorthalidone Other (See Comments)    creatinine  increases  . Hctz [Hydrochlorothiazide] Other (See Comments)    CREATNINE INCREASES  . Keflex [Cephalexin]   . Oxsoralen [Methoxsalen]    Follow-up Information    Follow up with FULP, CAMMIE, MD. Schedule an appointment as soon as possible for a visit in 1 week.   Specialty:  Family Medicine   Contact information:   [address] [place] [address] [phone]        The results of significant diagnostics from this hospitalization (including imaging, microbiology, ancillary and laboratory) are listed below for reference.    Significant Diagnostic Studies: Dg Chest 2 View  05/11/2015  CLINICAL DATA:  Wheezing. EXAM: CHEST  2 VIEW COMPARISON:  08/22/2007 FINDINGS: Heart size and pulmonary vascularity are normal. No infiltrates or effusions. The lungs are slightly hyperinflated. Diffuse degenerative changes in the thoracic spine. IMPRESSION: No active cardiopulmonary disease. Electronically Signed   By: Lorriane Shire M.D.   On: 05/11/2015 16:22    Microbiology: No results found for this or any previous visit (from the past 240 hour(s)).   Labs: Basic Metabolic Panel:  Recent Labs Lab 05/11/15 1532 05/12/15 0512 05/13/15 0511  NA 119* 127* 132*  K 3.5 3.9 3.8  CL 87* 95* 101  CO2 '22 23 25  '$ GLUCOSE 129* 163* 103*  BUN '10 8 11  '$ CREATININE 0.64 0.70 0.78  CALCIUM 8.6* 8.4* 8.8*  MG 1.4*  --   --   PHOS 2.4*  --   --    Liver Function Tests:  Recent Labs Lab 05/11/15 1532 05/12/15 0512  AST 30 30  ALT 18 15  ALKPHOS 65  55  BILITOT 0.6 0.4  PROT 6.8 6.2*  ALBUMIN 3.7 3.2*   No results for input(s): LIPASE, AMYLASE in the last 168 hours. No results for input(s): AMMONIA in the last 168 hours. CBC:  Recent Labs Lab 05/11/15 1532 05/12/15 0512 05/13/15 0511  WBC 4.5 1.7* 5.1  NEUTROABS 2.6 1.1* 3.2  HGB 13.2 11.8* 13.0  HCT 37.3 33.8* 38.7  MCV 80.4 79.9 83.8  PLT 120* 122* 153   Cardiac Enzymes: No results for input(s): CKTOTAL, CKMB, CKMBINDEX, TROPONINI in the last 168 hours. BNP: BNP (last 3 results) No results for input(s): BNP in the last 8760 hours.  ProBNP (  last 3 results) No results for input(s): PROBNP in the last 8760 hours.  CBG: No results for input(s): GLUCAP in the last 168 hours.     SignedDomenic Polite MD  FACP  [facility] 05/13/2015, 8:00 AM

## 2015-05-13 NOTE — Progress Notes (Signed)
Physical Therapy Treatment Patient Details Name: Cassidy Ellison MRN: [id number] DOB: Dec 14, 1921 Today's Date: 05/13/2015    History of Present Illness Cassidy Ellison is a 82 y.o. female with a past medical history of hypertension, mild psoriasis, GERD, DVT, carpal tunnel syndrome (left), who was brought via EMS 05/10/14 to the hospital due to generalized weakness, nausea with one episode of vomiting & diarrhea. Had orthostatic changes when standing.   She was unable to walk on day of admission due to weakness    PT Comments    Patient is ambulating much stronger, manages 4 wheeled RW. Recommend HHPT safety eval and strengthening.   Follow Up Recommendations  Home health PT;Supervision/Assistance - 24 hour     Equipment Recommendations   (has  4 wheeled)    Recommendations for Other Services       Precautions / Restrictions Precautions Precautions: Fall Precaution Comments: reports x 2 in recent past.    Mobility  Bed Mobility                  Transfers   Equipment used: Rolling walker (2 wheeled);4-wheeled walker Transfers: Sit to/from Stand Sit to Stand: Supervision         General transfer comment: cues for UE placement and brakes on 4 wheel walker  Ambulation/Gait Ambulation/Gait assistance: Supervision Ambulation Distance (Feet): 400 Feet Assistive device: 4-wheeled walker Gait Pattern/deviations: Step-through pattern     General Gait Details: supervision, cues for barakes, acues for safety to [facility] around obstacles, ambulated in room without the RW, fgait staediness improved.   Stairs            Wheelchair Mobility    Modified Rankin (Stroke Patients Only)       Balance           Standing balance support: During functional activity;No upper extremity supported Standing balance-Leahy Scale: Fair                      Cognition Arousal/Alertness: Awake/alert                          Exercises      General  Comments        Pertinent Vitals/Pain Pain Assessment: No/denies pain    Home Living Family/patient expects to be discharged to:: Private residence ([place]) Living Arrangements: Alone Available Help at Discharge: Family;Available PRN/intermittently Type of Home: Apartment Home Access: Level entry            Prior Function            PT Goals (current goals can now be found in the care plan section) Progress towards PT goals: Progressing toward goals    Frequency  Min 3X/week    PT Plan Current plan remains appropriate    Co-evaluation             End of Session   Activity Tolerance: Patient tolerated treatment well Patient left: in chair;with call bell/phone within reach     Time: 1220-1238 PT Time Calculation (min) (ACUTE ONLY): 18 min  Charges:  $Gait Training: 8-22 mins                    G Codes:      Marcelino Freestone PT [phone]  05/13/2015, 1:30 PM

## 2015-05-13 NOTE — Care Management Note (Signed)
Case Management Note  Patient Details  Name: Cassidy Ellison MRN: [id number] Date of Birth: [birth year]  Subjective/Objective:  HHPT/OT ordered. Facility uses [facility] faxed w/confirmation to [phone]                  Action/Plan:d/c back to [place].   Expected Discharge Date:                 Expected Discharge Plan:  [place]  In-House Referral:     Discharge planning Services     Post Acute Care Choice:    Choice offered to:     DME Arranged:    DME Agency:     HH Arranged:  PT, OT HH Agency:   [facility])  Status of Service:  Completed, signed off  Medicare Important Message Given:    Date Medicare IM Given:    Medicare IM give by:    Date Additional Medicare IM Given:    Additional Medicare Important Message give by:     If discussed at [place] of Stay Meetings, dates discussed:    Additional Comments:  Dessa Phi, RN 05/13/2015, 2:54 PM

## 2015-05-13 NOTE — Progress Notes (Addendum)
Occupational Therapy Treatment Patient Details Name: Cassidy Ellison MRN: [id number] DOB: 10/13/1921 Today's Date: 05/13/2015    History of present illness Cassidy Ellison is a 82 y.o. female with a past medical history of hypertension, mild psoriasis, GERD, DVT, carpal tunnel syndrome (left), who was brought via EMS 05/10/14 to the hospital due to generalized weakness, nausea with one episode of vomiting & diarrhea. Had orthostatic changes when standing.   She was unable to walk on day of admission due to weakness   OT comments  Patient progressing towards OT goals. Patient will continue to benefit from [facility] to increase overall independence and safety in her apartment. Pt did require verbal cueing for safety with hand placement and locking of rollator.    Follow Up Recommendations  Home health OT;Supervision - Intermittent    Equipment Recommendations  Tub/shower seat with back (HHOT to confirm this, family plans to purchase on own)    Recommendations for Other Services  None at this time   Precautions / Restrictions Precautions Precautions: Fall Precaution Comments: reports x 2 in recent past. Restrictions Weight Bearing Restrictions: No    Mobility Bed Mobility General bed mobility comments: Pt found seated in recliner upon OT entering/exiting room   Transfers Overall transfer level: Needs assistance Equipment used: 4-wheeled walker Transfers: Sit to/from Stand Sit to Stand: Supervision General transfer comment: cues for UE placement and brakes on 4 wheel walker    Balance Overall balance assessment: Needs assistance;History of Falls Sitting-balance support: No upper extremity supported;Feet supported Sitting balance-Leahy Scale: Good     Standing balance support: Bilateral upper extremity supported;During functional activity Standing balance-Leahy Scale: Fair Standing balance comment: minor LOB in BR that therapist assisted with re-correcting    ADL Overall ADL's : Needs  assistance/impaired General ADL Comments: Pt found seated in recliner with two family members present. Pt eager to d/c back to her apartment today. Pt reports staff calls to check on her, but they will be doing more than that once she returns. Discussed safety with rollator (importance of locking prior to sitting and standing and importance of reaching back for chair or whatever she's sitting on, not holding onto rollator when sitting or standing). Discussed shower stall transfer as son had a question regarding this. Discussed HHOT assisting with most appropriate shower chair. Showed son and daughter my recommended shower seat with back. Had patient practice stepping over shower stall transfer and patient with minor LOB, therapist there to assist with re-correcting. Discussed importance of someone being present for shower initially and working with OT regarding this. Discussed energy conservation of sitting prn on rollator for ADL tasks and in shower for safety.      Cognition   Behavior During Therapy: WFL for tasks assessed/performed Overall Cognitive Status: Within Functional Limits for tasks assessed                Pertinent Vitals/ Pain       Pain Assessment: No/denies pain  Home Living Family/patient expects to be discharged to:: Private residence ([place]) Living Arrangements: Alone Available Help at Discharge: Family;Available PRN/intermittently Type of Home: Apartment Home Access: Level entry          Frequency Min 2X/week     Progress Toward Goals  OT Goals(current goals can now be found in the care plan section)  Progress towards OT goals: Progressing toward goals  Acute Rehab OT Goals Patient Stated Goal: go home today  Plan Discharge plan remains appropriate    End of  Session Equipment Utilized During Treatment: Rolling walker   Activity Tolerance Patient tolerated treatment well   Patient Left in chair;with call bell/phone within reach;with family/visitor  present    Time: 2883-3744 OT Time Calculation (min): 12 min  Charges: OT Evaluation $OT Eval Low Complexity: 1 Procedure OT Treatments $Self Care/Home Management : 8-22 mins  Chrys Racer , MS, OTR/L, CLT Pager: [phone]  05/13/2015, 3:08 PM

## 2015-05-15 ENCOUNTER — Encounter (HOSPITAL_COMMUNITY): Payer: Self-pay

## 2015-05-15 ENCOUNTER — Inpatient Hospital Stay (HOSPITAL_COMMUNITY)
Admission: EM | Admit: 2015-05-15 | Discharge: 2015-05-24 | DRG: 643 | Disposition: A | Discharge status: Skilled Nursing Facility | Payer: Medicare Other | Attending: Internal Medicine | Admitting: Internal Medicine

## 2015-05-15 ENCOUNTER — Emergency Department (HOSPITAL_COMMUNITY): Payer: Medicare Other

## 2015-05-15 DIAGNOSIS — I10 Essential (primary) hypertension: Secondary | ICD-10-CM | POA: Diagnosis not present

## 2015-05-15 DIAGNOSIS — Z881 Allergy status to other antibiotic agents status: Secondary | ICD-10-CM | POA: Diagnosis not present

## 2015-05-15 DIAGNOSIS — E871 Hypo-osmolality and hyponatremia: Secondary | ICD-10-CM | POA: Diagnosis not present

## 2015-05-15 DIAGNOSIS — J209 Acute bronchitis, unspecified: Secondary | ICD-10-CM | POA: Diagnosis present

## 2015-05-15 DIAGNOSIS — R079 Chest pain, unspecified: Secondary | ICD-10-CM | POA: Diagnosis not present

## 2015-05-15 DIAGNOSIS — J1100 Influenza due to unidentified influenza virus with unspecified type of pneumonia: Secondary | ICD-10-CM | POA: Diagnosis present

## 2015-05-15 DIAGNOSIS — Z79899 Other long term (current) drug therapy: Secondary | ICD-10-CM | POA: Diagnosis not present

## 2015-05-15 DIAGNOSIS — R1310 Dysphagia, unspecified: Secondary | ICD-10-CM | POA: Diagnosis not present

## 2015-05-15 DIAGNOSIS — E876 Hypokalemia: Secondary | ICD-10-CM | POA: Diagnosis present

## 2015-05-15 DIAGNOSIS — R531 Weakness: Secondary | ICD-10-CM | POA: Diagnosis not present

## 2015-05-15 DIAGNOSIS — E222 Syndrome of inappropriate secretion of antidiuretic hormone: Secondary | ICD-10-CM | POA: Diagnosis not present

## 2015-05-15 DIAGNOSIS — R938 Abnormal findings on diagnostic imaging of other specified body structures: Secondary | ICD-10-CM | POA: Diagnosis not present

## 2015-05-15 DIAGNOSIS — R911 Solitary pulmonary nodule: Secondary | ICD-10-CM | POA: Diagnosis not present

## 2015-05-15 DIAGNOSIS — G9349 Other encephalopathy: Secondary | ICD-10-CM | POA: Diagnosis not present

## 2015-05-15 DIAGNOSIS — C3490 Malignant neoplasm of unspecified part of unspecified bronchus or lung: Secondary | ICD-10-CM | POA: Diagnosis present

## 2015-05-15 DIAGNOSIS — R059 Cough, unspecified: Secondary | ICD-10-CM | POA: Diagnosis present

## 2015-05-15 DIAGNOSIS — G2581 Restless legs syndrome: Secondary | ICD-10-CM | POA: Diagnosis present

## 2015-05-15 DIAGNOSIS — Z9109 Other allergy status, other than to drugs and biological substances: Secondary | ICD-10-CM | POA: Diagnosis not present

## 2015-05-15 DIAGNOSIS — R197 Diarrhea, unspecified: Secondary | ICD-10-CM | POA: Diagnosis present

## 2015-05-15 DIAGNOSIS — N390 Urinary tract infection, site not specified: Secondary | ICD-10-CM

## 2015-05-15 DIAGNOSIS — R29898 Other symptoms and signs involving the musculoskeletal system: Secondary | ICD-10-CM | POA: Diagnosis not present

## 2015-05-15 DIAGNOSIS — J439 Emphysema, unspecified: Secondary | ICD-10-CM | POA: Diagnosis present

## 2015-05-15 DIAGNOSIS — G9340 Encephalopathy, unspecified: Secondary | ICD-10-CM | POA: Diagnosis not present

## 2015-05-15 DIAGNOSIS — R5383 Other fatigue: Secondary | ICD-10-CM | POA: Diagnosis not present

## 2015-05-15 DIAGNOSIS — J1008 Influenza due to other identified influenza virus with other specified pneumonia: Secondary | ICD-10-CM | POA: Diagnosis not present

## 2015-05-15 DIAGNOSIS — Z888 Allergy status to other drugs, medicaments and biological substances status: Secondary | ICD-10-CM

## 2015-05-15 DIAGNOSIS — R05 Cough: Secondary | ICD-10-CM | POA: Diagnosis present

## 2015-05-15 DIAGNOSIS — R410 Disorientation, unspecified: Secondary | ICD-10-CM | POA: Diagnosis not present

## 2015-05-15 DIAGNOSIS — K219 Gastro-esophageal reflux disease without esophagitis: Secondary | ICD-10-CM | POA: Diagnosis present

## 2015-05-15 DIAGNOSIS — J09X1 Influenza due to identified novel influenza A virus with pneumonia: Secondary | ICD-10-CM | POA: Diagnosis present

## 2015-05-15 DIAGNOSIS — K2970 Gastritis, unspecified, without bleeding: Secondary | ICD-10-CM | POA: Diagnosis not present

## 2015-05-15 DIAGNOSIS — R2681 Unsteadiness on feet: Secondary | ICD-10-CM | POA: Diagnosis not present

## 2015-05-15 DIAGNOSIS — R110 Nausea: Secondary | ICD-10-CM | POA: Diagnosis not present

## 2015-05-15 DIAGNOSIS — Z87440 Personal history of urinary (tract) infections: Secondary | ICD-10-CM

## 2015-05-15 DIAGNOSIS — Z7982 Long term (current) use of aspirin: Secondary | ICD-10-CM | POA: Diagnosis not present

## 2015-05-15 DIAGNOSIS — R061 Stridor: Secondary | ICD-10-CM

## 2015-05-15 DIAGNOSIS — G43109 Migraine with aura, not intractable, without status migrainosus: Secondary | ICD-10-CM | POA: Diagnosis present

## 2015-05-15 DIAGNOSIS — R8299 Other abnormal findings in urine: Secondary | ICD-10-CM | POA: Diagnosis not present

## 2015-05-15 DIAGNOSIS — Z87891 Personal history of nicotine dependence: Secondary | ICD-10-CM | POA: Diagnosis not present

## 2015-05-15 DIAGNOSIS — M6281 Muscle weakness (generalized): Secondary | ICD-10-CM | POA: Diagnosis not present

## 2015-05-15 HISTORY — DX: Restless legs syndrome: G25.81

## 2015-05-15 LAB — CBC WITH DIFFERENTIAL/PLATELET
BASOS ABS: 0 10*3/uL (ref 0.0–0.1)
Basophils Relative: 0 %
EOS PCT: 1 %
Eosinophils Absolute: 0 10*3/uL (ref 0.0–0.7)
HCT: 37.7 % (ref 36.0–46.0)
Hemoglobin: 13.7 g/dL (ref 12.0–15.0)
LYMPHS PCT: 19 %
Lymphs Abs: 1.1 10*3/uL (ref 0.7–4.0)
MCH: 28.5 pg (ref 26.0–34.0)
MCHC: 36.3 g/dL — AB (ref 30.0–36.0)
MCV: 78.5 fL (ref 78.0–100.0)
MONO ABS: 0.4 10*3/uL (ref 0.1–1.0)
MONOS PCT: 7 %
NEUTROS ABS: 4 10*3/uL (ref 1.7–7.7)
NEUTROS PCT: 73 %
PLATELETS: 170 10*3/uL (ref 150–400)
RBC: 4.8 MIL/uL (ref 3.87–5.11)
RDW: 11.9 % (ref 11.5–15.5)
WBC: 5.5 10*3/uL (ref 4.0–10.5)

## 2015-05-15 LAB — COMPREHENSIVE METABOLIC PANEL
ALBUMIN: 3.5 g/dL (ref 3.5–5.0)
ALK PHOS: 68 U/L (ref 38–126)
ALT: 19 U/L (ref 14–54)
ANION GAP: 9 (ref 5–15)
AST: 27 U/L (ref 15–41)
BUN: 13 mg/dL (ref 6–20)
CALCIUM: 8.7 mg/dL — AB (ref 8.9–10.3)
CHLORIDE: 79 mmol/L — AB (ref 101–111)
CO2: 25 mmol/L (ref 22–32)
Creatinine, Ser: 0.58 mg/dL (ref 0.44–1.00)
GFR calc non Af Amer: >60 mL/min (ref >60)
GLUCOSE: 120 mg/dL — AB (ref 65–99)
POTASSIUM: 3.5 mmol/L (ref 3.5–5.1)
SODIUM: 113 mmol/L — AB (ref 135–145)
Total Bilirubin: 1.1 mg/dL (ref 0.3–1.2)
Total Protein: 6.8 g/dL (ref 6.5–8.1)

## 2015-05-15 LAB — TROPONIN I: Troponin I: <0.03 ng/mL (ref <0.031)

## 2015-05-15 MED ORDER — SODIUM CHLORIDE 0.9 % IV SOLN
INTRAVENOUS | Status: DC
  Administered 2015-05-15 – 2015-05-16 (×2): via INTRAVENOUS

## 2015-05-15 MED ORDER — METHYLPREDNISOLONE SODIUM SUCC 125 MG IJ SOLR: 60.0000 mg | Freq: Every day | INTRAMUSCULAR | Status: DC

## 2015-05-15 MED ORDER — IPRATROPIUM-ALBUTEROL 0.5-2.5 (3) MG/3ML IN SOLN
3.0000 mL | RESPIRATORY_TRACT | Status: DC
  Administered 2015-05-16 (×5): 3 mL via RESPIRATORY_TRACT
  Filled 2015-05-15 (×6): qty 3

## 2015-05-15 MED ORDER — SODIUM CHLORIDE 0.9 % IV SOLN: Freq: Once | INTRAVENOUS | Status: DC

## 2015-05-15 MED ORDER — ADULT MULTIVITAMIN W/MINERALS CH
1.0000 | ORAL_TABLET | Freq: Every day | ORAL | Status: DC
  Administered 2015-05-16 – 2015-05-24 (×9): 1 via ORAL
  Filled 2015-05-15 (×9): qty 1

## 2015-05-15 MED ORDER — AMLODIPINE BESYLATE 10 MG PO TABS
10.0000 mg | ORAL_TABLET | Freq: Every day | ORAL | Status: DC
  Administered 2015-05-16 – 2015-05-24 (×9): 10 mg via ORAL
  Filled 2015-05-15 (×9): qty 1

## 2015-05-15 MED ORDER — ADULT MULTIVITAMIN LIQUID CH: 5.0000 mL | Freq: Every day | ORAL | Status: DC

## 2015-05-15 MED ORDER — ALBUTEROL SULFATE (2.5 MG/3ML) 0.083% IN NEBU: 2.5000 mg | INHALATION_SOLUTION | RESPIRATORY_TRACT | Status: DC | PRN

## 2015-05-15 MED ORDER — SODIUM CHLORIDE 0.9 % IJ SOLN
3.0000 mL | Freq: Two times a day (BID) | INTRAMUSCULAR | Status: DC
  Administered 2015-05-15 – 2015-05-23 (×12): 3 mL via INTRAVENOUS

## 2015-05-15 MED ORDER — VITAMIN D 1000 UNITS PO TABS
1000.0000 [IU] | ORAL_TABLET | Freq: Every day | ORAL | Status: DC
  Administered 2015-05-16 – 2015-05-24 (×9): 1000 [IU] via ORAL
  Filled 2015-05-15 (×9): qty 1

## 2015-05-15 MED ORDER — OMEGA-3 FATTY ACIDS 1000 MG PO CAPS: 2000.0000 mg | ORAL_CAPSULE | Freq: Every day | ORAL | Status: DC

## 2015-05-15 MED ORDER — ACETAMINOPHEN 325 MG PO TABS: 650.0000 mg | ORAL_TABLET | Freq: Four times a day (QID) | ORAL | Status: DC | PRN

## 2015-05-15 MED ORDER — LOSARTAN POTASSIUM 50 MG PO TABS
100.0000 mg | ORAL_TABLET | Freq: Every day | ORAL | Status: DC
  Administered 2015-05-16: 100 mg via ORAL
  Filled 2015-05-15: qty 2

## 2015-05-15 MED ORDER — ACETAMINOPHEN 650 MG RE SUPP: 650.0000 mg | Freq: Four times a day (QID) | RECTAL | Status: DC | PRN

## 2015-05-15 MED ORDER — METHYLPREDNISOLONE SODIUM SUCC 125 MG IJ SOLR
60.0000 mg | INTRAMUSCULAR | Status: DC
  Administered 2015-05-15 – 2015-05-16 (×2): 60 mg via INTRAVENOUS
  Filled 2015-05-15 (×2): qty 2

## 2015-05-15 MED ORDER — IPRATROPIUM-ALBUTEROL 0.5-2.5 (3) MG/3ML IN SOLN
3.0000 mL | Freq: Once | RESPIRATORY_TRACT | Status: AC
  Administered 2015-05-15: 3 mL via RESPIRATORY_TRACT
  Filled 2015-05-15: qty 3

## 2015-05-15 MED ORDER — ROPINIROLE HCL 0.25 MG PO TABS
0.2500 mg | ORAL_TABLET | Freq: Every day | ORAL | Status: DC
  Administered 2015-05-15 – 2015-05-18 (×4): 0.25 mg via ORAL
  Filled 2015-05-15 (×5): qty 1

## 2015-05-15 MED ORDER — CALCIUM 1200 1200-1000 MG-UNIT PO CHEW: 1.0000 | CHEWABLE_TABLET | Freq: Every day | ORAL | Status: DC

## 2015-05-15 MED ORDER — IPRATROPIUM-ALBUTEROL 0.5-2.5 (3) MG/3ML IN SOLN
RESPIRATORY_TRACT | Status: AC
  Filled 2015-05-15: qty 3

## 2015-05-15 MED ORDER — PANTOPRAZOLE SODIUM 40 MG PO TBEC
40.0000 mg | DELAYED_RELEASE_TABLET | Freq: Every day | ORAL | Status: DC
  Administered 2015-05-15 – 2015-05-24 (×10): 40 mg via ORAL
  Filled 2015-05-15 (×10): qty 1

## 2015-05-15 MED ORDER — PREDNISONE 20 MG PO TABS: 40.0000 mg | ORAL_TABLET | Freq: Every day | ORAL | Status: DC

## 2015-05-15 MED ORDER — CALCIUM CARBONATE-VITAMIN D 500-200 MG-UNIT PO TABS
1.0000 | ORAL_TABLET | Freq: Every day | ORAL | Status: DC
  Administered 2015-05-16 – 2015-05-24 (×9): 1 via ORAL
  Filled 2015-05-15 (×9): qty 1

## 2015-05-15 MED ORDER — METOPROLOL SUCCINATE ER 100 MG PO TB24
100.0000 mg | ORAL_TABLET | Freq: Every day | ORAL | Status: DC
  Administered 2015-05-16 – 2015-05-24 (×9): 100 mg via ORAL
  Filled 2015-05-15 (×9): qty 1

## 2015-05-15 MED ORDER — RED YEAST RICE EXTRACT 600 MG PO TABS: 1200.0000 mg | ORAL_TABLET | Freq: Every day | ORAL | Status: DC

## 2015-05-15 MED ORDER — ASPIRIN 81 MG PO CHEW
81.0000 mg | CHEWABLE_TABLET | Freq: Every day | ORAL | Status: DC
  Administered 2015-05-16 – 2015-05-24 (×9): 81 mg via ORAL
  Filled 2015-05-15 (×9): qty 1

## 2015-05-15 MED ORDER — VITAMIN C 500 MG PO TABS
500.0000 mg | ORAL_TABLET | Freq: Every day | ORAL | Status: DC
  Administered 2015-05-16 – 2015-05-24 (×9): 500 mg via ORAL
  Filled 2015-05-15 (×9): qty 1

## 2015-05-15 MED ORDER — IOHEXOL 350 MG/ML SOLN
100.0000 mL | Freq: Once | INTRAVENOUS | Status: AC | PRN
  Administered 2015-05-15: 100 mL via INTRAVENOUS

## 2015-05-15 MED ORDER — ONDANSETRON HCL 4 MG PO TABS: 4.0000 mg | ORAL_TABLET | Freq: Four times a day (QID) | ORAL | Status: DC | PRN

## 2015-05-15 MED ORDER — ONDANSETRON HCL 4 MG/2ML IJ SOLN
4.0000 mg | Freq: Four times a day (QID) | INTRAMUSCULAR | Status: DC | PRN
  Administered 2015-05-16: 4 mg via INTRAVENOUS
  Filled 2015-05-15: qty 2

## 2015-05-15 MED ORDER — HEPARIN SODIUM (PORCINE) 5000 UNIT/ML IJ SOLN
5000.0000 [IU] | Freq: Three times a day (TID) | INTRAMUSCULAR | Status: DC
  Administered 2015-05-15 – 2015-05-24 (×24): 5000 [IU] via SUBCUTANEOUS
  Filled 2015-05-15 (×23): qty 1

## 2015-05-15 MED ORDER — TRIAMCINOLONE ACETONIDE 0.1 % EX CREA
1.0000 "application " | TOPICAL_CREAM | Freq: Two times a day (BID) | CUTANEOUS | Status: DC | PRN
  Filled 2015-05-15: qty 15

## 2015-05-15 MED ORDER — OMEGA-3-ACID ETHYL ESTERS 1 G PO CAPS
2.0000 g | ORAL_CAPSULE | Freq: Every day | ORAL | Status: DC
Start: 2015-05-16 — End: 2015-05-24
  Administered 2015-05-16 – 2015-05-24 (×9): 2 g via ORAL
  Filled 2015-05-15 (×10): qty 2

## 2015-05-15 MED ORDER — DM-GUAIFENESIN ER 30-600 MG PO TB12
1.0000 | ORAL_TABLET | Freq: Two times a day (BID) | ORAL | Status: DC
  Administered 2015-05-15 – 2015-05-19 (×8): 1 via ORAL
  Filled 2015-05-15 (×8): qty 1

## 2015-05-15 NOTE — ED Provider Notes (Signed)
CSN: [id number]     Arrival date & time 05/15/15  1649 History   First MD Initiated Contact with Patient 05/15/15 1654     Chief Complaint  Patient presents with  . Weakness  . Altered Mental Status     (Consider location/radiation/quality/duration/timing/severity/associated sxs/prior Treatment) HPI  Recently seen here and admitted for hyponatremia was discharged after normalizing. At that time thought to be secondary to recent GI illness. However last couple days patient has had worsening weakness again also some nausea. No other symptoms really. Altered mental status is just more sleepy. This is similar to her previous episode. No exacerbating or relieving factors.  Past Medical History  Diagnosis Date  . Hypertension   . GERD (gastroesophageal reflux disease)   . Carpal tunnel syndrome     left  . DVT (deep venous thrombosis) ([place])   . RLS (restless legs syndrome)    Past Surgical History  Procedure Laterality Date  . Abdominal hysterectomy    . Gallbladder surgery    . Carpal tunnel release Left 10/31/2014    Procedure: LEFT CARPAL TUNNEL RELEASE;  Surgeon: Leanora Cover, MD;  Location: [place];  Service: Orthopedics;  Laterality: Left;   No family history on file. Social History  Substance Use Topics  . Smoking status: Former Research scientist (life sciences)  . Smokeless tobacco: None     Comment: quit 20years ago  . Alcohol Use: No   OB History    No data available     Review of Systems  Constitutional: Negative for fever, chills and fatigue.  Eyes: Negative for pain.  Respiratory: Negative for cough and shortness of breath.   Neurological: Positive for weakness and light-headedness.  All other systems reviewed and are negative.     Allergies  Adhesive; Chlorthalidone; Hctz; Keflex; and Oxsoralen  Home Medications   Prior to Admission medications   Medication Sig Start Date End Date Taking? Authorizing Provider  amLODipine (NORVASC) 10 MG tablet Take 10 mg by  mouth daily.   Yes Historical Provider, MD  aspirin 81 MG tablet Take 81 mg by mouth daily.   Yes Historical Provider, MD  Calcium Carbonate-Vit D-Min (CALCIUM 1200 PO) Take 1,200 mg by mouth daily.   Yes Historical Provider, MD  Cholecalciferol (VITAMIN D-3) 1000 units CAPS Take 1,000 Units by mouth daily.   Yes Historical Provider, MD  Dextromethorphan-Guaifenesin 5-100 MG/5ML LIQD Take 5 mLs by mouth every 4 (four) hours as needed (cough).   Yes Historical Provider, MD  fish oil-omega-3 fatty acids 1000 MG capsule Take 2,000 mg by mouth daily.   Yes Historical Provider, MD  Ketotifen Fumarate (ITCHY EYE DROPS OP) Apply 2 drops to eye 2 (two) times daily as needed (itching eye).   Yes Historical Provider, MD  losartan (COZAAR) 100 MG tablet Take 100 mg by mouth daily.   Yes Historical Provider, MD  omeprazole (PRILOSEC) 20 MG capsule Take 20 mg by mouth daily.   Yes Historical Provider, MD  ondansetron (ZOFRAN ODT) 4 MG disintegrating tablet Take 1 tablet (4 mg total) by mouth every 8 (eight) hours as needed for nausea or vomiting. 05/13/15  Yes Domenic Polite, MD  ONE DAILY MULTIPLE VITAMIN PO Take by mouth.   Yes Historical Provider, MD  polyethylene glycol (MIRALAX / GLYCOLAX) packet Take 17 g by mouth daily as needed for mild constipation.   Yes Historical Provider, MD  RED YEAST RICE EXTRACT PO Take 1,200 mg by mouth daily.   Yes Historical Provider, MD  rOPINIRole (  REQUIP) 0.25 MG tablet Take 1 tablet (0.25 mg total) by mouth at bedtime. 05/13/15  Yes Domenic Polite, MD  TOPROL XL 200 MG 24 hr tablet Take 100 mg by mouth daily.  10/23/14  Yes Historical Provider, MD  triamcinolone cream (KENALOG) 0.1 % Apply 1 application topically 2 (two) times daily as needed (itch).  02/19/15  Yes Historical Provider, MD  vitamin C (ASCORBIC ACID) 500 MG tablet Take 500 mg by mouth daily.   Yes Historical Provider, MD  levofloxacin (LEVAQUIN) 250 MG tablet Take 1 tablet (250 mg total) by mouth daily. For  2days Patient not taking: Reported on 05/15/2015 05/13/15   Domenic Polite, MD   BP 127/67 mmHg  Pulse 74  Temp(Src) 97.5 F (36.4 C) (Oral)  Resp 18  Ht '5\' 5"'$  (1.651 m)  Wt 135 lb 5.8 oz (61.4 kg)  BMI 22.53 kg/m2  SpO2 94% Physical Exam  Constitutional: She is oriented to person, place, and time. She appears well-developed and well-nourished.  HENT:  Head: Normocephalic and atraumatic.  Neck: Normal range of motion.  Cardiovascular: Normal rate and regular rhythm.   Pulmonary/Chest: No stridor. No respiratory distress.  Abdominal: She exhibits no distension.  Neurological: She is alert and oriented to person, place, and time. No cranial nerve deficit. Coordination normal.  No altered mental status, able to give full seemingly accurate history.  Face is symmetric, EOM's intact, pupils equal and reactive, vision intact, tongue and uvula midline without deviation Upper and Lower extremity motor 5/5, intact pain perception in distal extremities, 2+ reflexes in biceps, patella and achilles tendons. Finger to nose normal, heel to shin normal.   Nursing note and vitals reviewed.   ED Course  Procedures (including critical care time)  CRITICAL CARE Performed by: Merrily Pew   Total critical care time: 35 minutes  Critical care time was exclusive of separately billable procedures and treating other patients.  Critical care was necessary to treat or prevent imminent or life-threatening deterioration.  Critical care was time spent personally by me on the following activities: development of treatment plan with patient and/or surrogate as well as nursing, discussions with consultants, evaluation of patient's response to treatment, examination of patient, obtaining history from patient or surrogate, ordering and performing treatments and interventions, ordering and review of laboratory studies, ordering and review of radiographic studies, pulse oximetry and re-evaluation of patient's  condition.   Labs Review Labs Reviewed  COMPREHENSIVE METABOLIC PANEL - Abnormal; Notable for the following:    Sodium 113 (*)    Chloride 79 (*)    Glucose, Bld 120 (*)    Calcium 8.7 (*)    All other components within normal limits  CBC WITH DIFFERENTIAL/PLATELET - Abnormal; Notable for the following:    MCHC 36.3 (*)    All other components within normal limits  BASIC METABOLIC PANEL - Abnormal; Notable for the following:    Sodium 117 (*)    Potassium 3.2 (*)    Chloride 84 (*)    Glucose, Bld 139 (*)    Calcium 8.4 (*)    All other components within normal limits  BASIC METABOLIC PANEL - Abnormal; Notable for the following:    Sodium 117 (*)    Potassium 3.2 (*)    Chloride 85 (*)    Glucose, Bld 147 (*)    Calcium 8.3 (*)    All other components within normal limits  CBC - Abnormal; Notable for the following:    WBC 3.4 (*)  HCT 35.9 (*)    MCHC 36.2 (*)    All other components within normal limits  BASIC METABOLIC PANEL - Abnormal; Notable for the following:    Sodium 117 (*)    Potassium 3.0 (*)    Chloride 83 (*)    Glucose, Bld 117 (*)    Calcium 8.6 (*)    All other components within normal limits  OSMOLALITY - Abnormal; Notable for the following:    Osmolality 241 (*)    All other components within normal limits  BASIC METABOLIC PANEL - Abnormal; Notable for the following:    Sodium 117 (*)    Potassium 3.3 (*)    Chloride 85 (*)    Glucose, Bld 165 (*)    Calcium 8.8 (*)    All other components within normal limits  BASIC METABOLIC PANEL - Abnormal; Notable for the following:    Sodium 112 (*)    Potassium 3.4 (*)    Chloride 82 (*)    CO2 21 (*)    Glucose, Bld 128 (*)    Calcium 8.4 (*)    All other components within normal limits  OSMOLALITY, URINE - Abnormal; Notable for the following:    Osmolality, Ur 283 (*)    All other components within normal limits  CULTURE, EXPECTORATED SPUTUM-ASSESSMENT  RESPIRATORY VIRUS PANEL  TROPONIN I   TSH  SODIUM, URINE, RANDOM  MAGNESIUM  PHOSPHORUS  PROTIME-INR  CORTISOL-AM, BLOOD  CEA  URINALYSIS, ROUTINE W REFLEX MICROSCOPIC (NOT AT [facility])  CBC WITH DIFFERENTIAL/PLATELET  COMPREHENSIVE METABOLIC PANEL  MAGNESIUM  PHOSPHORUS    Imaging Review Dg Chest 2 View  05/15/2015  CLINICAL DATA:  Increasing weakness, confusion and anorexia for 5 days, diarrhea, and productive cough for 1 day, recent admission for dehydration, hyponatremia, and UTI, history hypertension, former smoker EXAM: CHEST  2 VIEW COMPARISON:  05/11/2015 FINDINGS: Upper normal heart size. Calcified tortuous aorta. Mediastinal contours and pulmonary vascularity normal. Lungs mildly hyperinflated but clear. No pleural effusion or pneumothorax. Bones demineralized. IMPRESSION: Mild hyperinflation without acute infiltrate. Electronically Signed   By: Lavonia Dana M.D.   On: 05/15/2015 18:00   Ct Angio Chest Pe W/cm &/or Wo Cm  05/15/2015  CLINICAL DATA:  Increasing weakness, confusion and anorexia for 5 days, diarrhea and productive cough beginning today, recent dehydration, hyponatremia and UTI, hypertension, question pulmonary embolism EXAM: CT ANGIOGRAPHY CHEST WITH CONTRAST TECHNIQUE: Multidetector CT imaging of the chest was performed using the standard protocol during bolus administration of intravenous contrast. Multiplanar CT image reconstructions and MIPs were obtained to evaluate the vascular anatomy. CONTRAST:  157m OMNIPAQUE IOHEXOL 350 MG/ML SOLN IV COMPARISON:  None. FINDINGS: Scattered atherosclerotic calcifications aorta, coronary arteries and proximal great vessels. Aorta normal caliber without aneurysm or dissection. Minimal pericardial effusion. Pulmonary arteries well opacified and patent. No evidence of pulmonary embolism. No thoracic adenopathy. Slight thickening in the adrenal glands without discrete mass. Remaining visualized upper abdomen unremarkable. Bones demineralized with scattered degenerative disc  disease changes of thoracic spine. Peripheral interstitial thickening question fibrosis in RIGHT lower lobe. Central peribronchial thickening and minimal upper lobe emphysematous changes. Small focus of ground-glass opacity in the RIGHT upper lobe 10 mm diameter image 16. 2 mm RIGHT upper lobe subpleural nodule image 27. Remaining lungs clear. No pleural effusion or pneumothorax. Review of the MIP images confirms the above findings. IMPRESSION: No evidence of pulmonary embolism. Scattered atherosclerotic disease. Minimal pericardial fluid. Bronchitic and minimal emphysematous changes. Small focus of ground-glass opacity in RIGHT  upper lobe 10 mm diameter ; initial follow-up by chest CT without contrast is recommended in 3 months to confirm persistence, if clinically indicated. This recommendation follows the consensus statement: Recommendations for the Management of Subsolid Pulmonary Nodules Detected at CT: A Statement from the [place] as published in Radiology 2013; 266:304-317. Electronically Signed   By: Lavonia Dana M.D.   On: 05/15/2015 19:55   Mr Jeri Cos WU Contrast  05/16/2015  CLINICAL DATA:  Hyponatremia. Increasing confusion and weakness. Anorexia. Diarrhea and productive cough. EXAM: MRI HEAD WITHOUT AND WITH CONTRAST TECHNIQUE: Multiplanar, multiecho pulse sequences of the brain and surrounding structures were obtained without and with intravenous contrast. CONTRAST:  53m MULTIHANCE GADOBENATE DIMEGLUMINE 529 MG/ML IV SOLN COMPARISON:  None. FINDINGS: There is no evidence of acute infarct, intracranial hemorrhage, mass, midline shift, or extra-axial fluid collection. There is moderate cerebral atrophy. Small foci of T2 hyperintensity scattered throughout the cerebral white matter bilaterally are nonspecific but compatible with mild chronic small vessel ischemic disease. Minimal T2 hyperintensity in the pons likely reflects chronic small vessel ischemia, without evidence of osmotic  demyelination. No abnormal enhancement is identified. Prior bilateral cataract extraction is noted. No significant inflammatory disease is seen in the paranasal sinuses or mastoid air cells. The Major intracranial vascular flow voids are preserved. IMPRESSION: 1. No acute intracranial abnormality. 2. Mild chronic small vessel ischemic disease and moderate cerebral atrophy. Electronically Signed   By: ALogan BoresM.D.   On: 05/16/2015 15:27   Ct Abdomen Pelvis W Contrast  05/16/2015  CLINICAL DATA:  Hospitalized January 1st through May 13, 2015 for urinary tract infection and viral gastritis, persistent hyponatremia EXAM: CT ABDOMEN AND PELVIS WITH CONTRAST TECHNIQUE: Multidetector CT imaging of the abdomen and pelvis was performed using the standard protocol following bolus administration of intravenous contrast. CONTRAST:  1010mOMNIPAQUE IOHEXOL 300 MG/ML  SOLN COMPARISON:  None. FINDINGS: Lower chest:  No acute findings Hepatobiliary: Status post cholecystectomy. Mild intrahepatic biliary dilatation and mild to moderate extrahepatic biliary dilatation likely related to prior cholecystectomy. Pancreas: Negative Spleen: Negative Adrenals/Urinary Tract: 19 mm right adrenal nodule. 13 mm left adrenal nodule. Calcification of inferior aspect of left adrenal gland. Sub cm low-attenuation round and oval lesions involving both kidneys too small to characterize, most consistent with cysts. Minimal perinephric fluid/ inflammation on the left possibly related to pyelonephritis as stated in clinical indication. No hydronephrosis. Bladder contains relatively hyper attenuating fluid, likely residual excreted contrast from May 15, 2015 CT thorax examination. Stomach/Bowel: Stomach appear normal. Small bowel appears normal. There is moderate diverticulosis of the sigmoid colon. There is trace fluid in the inferior pelvis in the region of the sigmoid colon but there is no evidence of inflammatory change. There is mild  diverticulosis of the descending colon as well. Vascular/Lymphatic: Moderate atherosclerotic aortoiliac calcification Reproductive: Reproductive organs not identified presumably surgically absent. Other: As stated above, trace free fluid in the dependent inferior pelvis. Musculoskeletal: No acute musculoskeletal findings IMPRESSION: No specific acute findings. Minimal perinephric fluid or inflammation on the left could reflect recent pyelonephritis. Mild biliary dilatation likely related to status post cholecystectomy. Sigmoid diverticulosis without specific evidence of diverticulitis. Trace free fluid in the inferior pelvis is nonspecific. Electronically Signed   By: RaSkipper Cliche.D.   On: 05/16/2015 18:45   I have personally reviewed and evaluated these images and lab results as part of my medical decision-making.   EKG Interpretation   Date/Time:  Thursday May 15 2015 18:19:34 EST Ventricular Rate:  67 PR Interval:  182 QRS Duration: 99 QT Interval:  426 QTC Calculation: 450 R Axis:   -10 Text Interpretation:  Sinus rhythm Nonspecific T abnormalities, lateral  leads Confirmed by [facility] MD, Corene Cornea [phone]) on 05/15/2015 7:01:51 PM      MDM   Final diagnoses:  RLS (restless legs syndrome)  Hyponatremia    82 year old female found to be with symptomatic hyponatremia lower than previously. No recent GI illnesses or other causes this time.. Concern for possible renal or central causes. We'll admit to medicine for further evaluation. Started on 75cc/hr normal saline infusion here.    Merrily Pew, MD 05/16/15 2340

## 2015-05-15 NOTE — ED Notes (Signed)
Pt c/o increasing weakness, confusion, and lack of appetite x 5 days and diarrhea and productive cough x 1 starting today.  Denies pain.  Pt was recently admitted for dehydration, hyponatremia, and UTI.  Pt completed Levaquin this morning.  Daughter sts she was discharged x 2 days ago.  Recent bronchitis.  Pt was seen at PCP and sts "urine looks better, but they wanted to a chest x-ray and labs."  [facility] had a 3 hour wait, so they came here.

## 2015-05-15 NOTE — Progress Notes (Signed)
PHARMACIST - PHYSICIAN ORDER COMMUNICATION  CONCERNING: P&T Medication Policy on Herbal Medications  DESCRIPTION:  This patient's order for:  Red Yeast Rice has been noted.  This product(s) is classified as an "herbal" or natural product. Due to a lack of definitive safety studies or FDA approval, nonstandard manufacturing practices, plus the potential risk of unknown drug-drug interactions while on inpatient medications, the Pharmacy and Therapeutics Committee does not permit the use of "herbal" or natural products of this type within [facility].   ACTION TAKEN: The pharmacy department is unable to verify this order at this time and your patient has been informed of this safety policy. Please reevaluate patient's clinical condition at discharge and address if the herbal or natural product(s) should be resumed at that time.  Romeo Rabon, PharmD, pager [phone]. 05/15/2015,8:14 PM.

## 2015-05-15 NOTE — H&P (Addendum)
[facility] History and Physical  AMEN DARGIS TDV:[id number] DOB: 08-12-1921 DOA: 05/15/2015  Referring physician: ED physician PCP: Antony Blackbird, MD  Specialists:   Chief Complaint: Generalized weakness, cough, nausea, poor oral intake and mild confusion  HPI: Cassidy Ellison is a 82 y.o. female with PMH of hypertension, GERD, DVT, carpel tunnel syndrome, RLS, who presents with generalized weakness, nausea, poor oral  intake, and mild confusion.  Patient was recently hospitalized from 1/1-05/13/15, and was treated for UTI, hyponatremia and viral gastritis. Her sodium was 119 on previous admission, which improved to 132 at discharge. Patient was treated with Levaquin with resolution of her UTI symptoms. She had urinalysis in PCPs office which was negative yesterday. Per patient's daughter, after patient went home, she continued to have generalized weakness, poor oral intake, nausea, but no vomiting and mild confusion. Her diarrhea has almost resolved, she had very minimal amount of loose stool earlier today, currently no diarrhea. No abdominal pain. Patient still has cough with small amount of clear phlegm production. Patient has mild shortness of breath, but no chest pain, fever or chills. She has wheezing per daughter. Patient does not have unilateral weakness, hematuria, hematochezia, rashes, vitamin [place] hearing loss.  In ED, patient was found to have WBC 5.5, temperature normal, no tachycardia, no tachypnea, sodium 113 which was 132 and 05/12/15, renal function okay, chest x-ray has no infiltration, negative troponin. Patient's admitted to inpatient for further eval and treatment.  EKG: Independently reviewed. QTC 450. T-wave flattening diffusely.   Where does patient live?   At home    Can patient participate in ADLs?  None   Review of Systems:   General: no fevers, chills, no changes in body weight, has poor appetite, has fatigue HEENT: no blurry vision, hearing changes or sore  throat Pulm: has dyspnea, coughing, wheezing CV: no chest pain, palpitations Abd: has nausea, no vomiting, no abdominal pain, diarrhea, constipation GU: no dysuria, burning on urination, increased urinary frequency, hematuria  Ext: no leg edema Neuro: no unilateral weakness, numbness, or tingling, no vision change or hearing loss Skin: no rash MSK: No muscle spasm, no deformity, no limitation of range of movement in spin Heme: No easy bruising.  Travel history: No recent long distant travel.  Allergy:  Allergies  Allergen Reactions  . Adhesive [Tape] Itching  . Chlorthalidone Other (See Comments)    creatinine  increases  . Hctz [Hydrochlorothiazide] Other (See Comments)    CREATNINE INCREASES  . Keflex [Cephalexin]   . Oxsoralen [Methoxsalen]     Past Medical History  Diagnosis Date  . Hypertension   . GERD (gastroesophageal reflux disease)   . Carpal tunnel syndrome     left  . DVT (deep venous thrombosis) ([place])   . RLS (restless legs syndrome)     Past Surgical History  Procedure Laterality Date  . Abdominal hysterectomy    . Gallbladder surgery    . Carpal tunnel release Left 10/31/2014    Procedure: LEFT CARPAL TUNNEL RELEASE;  Surgeon: Leanora Cover, MD;  Location: [facility];  Service: Orthopedics;  Laterality: Left;    Social History:  reports that she has quit smoking. She does not have any smokeless tobacco history on file. She reports that she does not drink alcohol or use illicit drugs.  Family History: No family history on file.   Prior to Admission medications   Medication Sig Start Date End Date Taking? Authorizing Provider  amLODipine (NORVASC) 10 MG tablet Take 10 mg  by mouth daily.   Yes Historical Provider, MD  aspirin 81 MG tablet Take 81 mg by mouth daily.   Yes Historical Provider, MD  Calcium Carbonate-Vit D-Min (CALCIUM 1200 PO) Take 1,200 mg by mouth daily.   Yes Historical Provider, MD  Cholecalciferol (VITAMIN D-3) 1000 units  CAPS Take 1,000 Units by mouth daily.   Yes Historical Provider, MD  Dextromethorphan-Guaifenesin 5-100 MG/5ML LIQD Take 5 mLs by mouth every 4 (four) hours as needed (cough).   Yes Historical Provider, MD  fish oil-omega-3 fatty acids 1000 MG capsule Take 2,000 mg by mouth daily.   Yes Historical Provider, MD  Ketotifen Fumarate (ITCHY EYE DROPS OP) Apply 2 drops to eye 2 (two) times daily as needed (itching eye).   Yes Historical Provider, MD  losartan (COZAAR) 100 MG tablet Take 100 mg by mouth daily.   Yes Historical Provider, MD  omeprazole (PRILOSEC) 20 MG capsule Take 20 mg by mouth daily.   Yes Historical Provider, MD  ondansetron (ZOFRAN ODT) 4 MG disintegrating tablet Take 1 tablet (4 mg total) by mouth every 8 (eight) hours as needed for nausea or vomiting. 05/13/15  Yes Domenic Polite, MD  ONE DAILY MULTIPLE VITAMIN PO Take by mouth.   Yes Historical Provider, MD  polyethylene glycol (MIRALAX / GLYCOLAX) packet Take 17 g by mouth daily as needed for mild constipation.   Yes Historical Provider, MD  RED YEAST RICE EXTRACT PO Take 1,200 mg by mouth daily.   Yes Historical Provider, MD  rOPINIRole (REQUIP) 0.25 MG tablet Take 1 tablet (0.25 mg total) by mouth at bedtime. 05/13/15  Yes Domenic Polite, MD  TOPROL XL 200 MG 24 hr tablet Take 100 mg by mouth daily.  10/23/14  Yes Historical Provider, MD  triamcinolone cream (KENALOG) 0.1 % Apply 1 application topically 2 (two) times daily as needed (itch).  02/19/15  Yes Historical Provider, MD  vitamin C (ASCORBIC ACID) 500 MG tablet Take 500 mg by mouth daily.   Yes Historical Provider, MD  levofloxacin (LEVAQUIN) 250 MG tablet Take 1 tablet (250 mg total) by mouth daily. For 2days Patient not taking: Reported on 05/15/2015 05/13/15   Domenic Polite, MD    Physical Exam: Filed Vitals:   05/15/15 1659 05/15/15 1821  BP: 154/79 127/56  Pulse: 67 70  Temp: 97.7 F (36.5 C) 97.6 F (36.4 C)  TempSrc: Oral Temporal  Resp: 20 20  SpO2: 91% 96%    General: Not in acute distress.  HEENT:       Eyes: PERRL, EOMI, no scleral icterus.       ENT: No discharge from the ears and nose, no pharynx injection, no tonsillar enlargement.        Neck: No JVD, no bruit, no mass felt. Heme: No neck lymph node enlargement. Cardiac: S1/S2, RRR, No murmurs, No gallops or rubs. Pulm: Has wheezing bilaterally. No rales or rubs. Abd: Soft, nondistended, nontender, no rebound pain, no organomegaly, BS present. Ext: No pitting leg edema bilaterally. 2+DP/PT pulse bilaterally. Musculoskeletal: No joint deformities, No joint redness or warmth, no limitation of ROM in spin. Skin: No rashes.  Neuro: Alert, very minimally confused, still oriented X3, cranial nerves II-XII grossly intact, moves all extremities, sensation to light touch intact.  Psych: Patient is not psychotic, no suicidal or hemocidal ideation.  Labs on Admission:  Basic Metabolic Panel:  Recent Labs Lab 05/11/15 1532 05/12/15 0512 05/13/15 0511 05/15/15 1815  NA 119* 127* 132* 113*  K 3.5 3.9 3.8  3.5  CL 87* 95* 101 79*  CO2 '22 23 25 25  '$ GLUCOSE 129* 163* 103* 120*  BUN '10 8 11 13  '$ CREATININE 0.64 0.70 0.78 0.58  CALCIUM 8.6* 8.4* 8.8* 8.7*  MG 1.4*  --   --   --   PHOS 2.4*  --   --   --    Liver Function Tests:  Recent Labs Lab 05/11/15 1532 05/12/15 0512 05/15/15 1815  AST '30 30 27  '$ ALT '18 15 19  '$ ALKPHOS 65 55 68  BILITOT 0.6 0.4 1.1  PROT 6.8 6.2* 6.8  ALBUMIN 3.7 3.2* 3.5   No results for input(s): LIPASE, AMYLASE in the last 168 hours. No results for input(s): AMMONIA in the last 168 hours. CBC:  Recent Labs Lab 05/11/15 1532 05/12/15 0512 05/13/15 0511 05/15/15 1815  WBC 4.5 1.7* 5.1 5.5  NEUTROABS 2.6 1.1* 3.2 4.0  HGB 13.2 11.8* 13.0 13.7  HCT 37.3 33.8* 38.7 37.7  MCV 80.4 79.9 83.8 78.5  PLT 120* 122* 153 170   Cardiac Enzymes:  Recent Labs Lab 05/15/15 1815  TROPONINI <0.03    BNP (last 3 results) No results for input(s): BNP in  the last 8760 hours.  ProBNP (last 3 results) No results for input(s): PROBNP in the last 8760 hours.  CBG: No results for input(s): GLUCAP in the last 168 hours.  Radiological Exams on Admission: Dg Chest 2 View  05/15/2015  CLINICAL DATA:  Increasing weakness, confusion and anorexia for 5 days, diarrhea, and productive cough for 1 day, recent admission for dehydration, hyponatremia, and UTI, history hypertension, former smoker EXAM: CHEST  2 VIEW COMPARISON:  05/11/2015 FINDINGS: Upper normal heart size. Calcified tortuous aorta. Mediastinal contours and pulmonary vascularity normal. Lungs mildly hyperinflated but clear. No pleural effusion or pneumothorax. Bones demineralized. IMPRESSION: Mild hyperinflation without acute infiltrate. Electronically Signed   By: Lavonia Dana M.D.   On: 05/15/2015 18:00   Ct Angio Chest Pe W/cm &/or Wo Cm  05/15/2015  CLINICAL DATA:  Increasing weakness, confusion and anorexia for 5 days, diarrhea and productive cough beginning today, recent dehydration, hyponatremia and UTI, hypertension, question pulmonary embolism EXAM: CT ANGIOGRAPHY CHEST WITH CONTRAST TECHNIQUE: Multidetector CT imaging of the chest was performed using the standard protocol during bolus administration of intravenous contrast. Multiplanar CT image reconstructions and MIPs were obtained to evaluate the vascular anatomy. CONTRAST:  124m OMNIPAQUE IOHEXOL 350 MG/ML SOLN IV COMPARISON:  None. FINDINGS: Scattered atherosclerotic calcifications aorta, coronary arteries and proximal great vessels. Aorta normal caliber without aneurysm or dissection. Minimal pericardial effusion. Pulmonary arteries well opacified and patent. No evidence of pulmonary embolism. No thoracic adenopathy. Slight thickening in the adrenal glands without discrete mass. Remaining visualized upper abdomen unremarkable. Bones demineralized with scattered degenerative disc disease changes of thoracic spine. Peripheral interstitial  thickening question fibrosis in RIGHT lower lobe. Central peribronchial thickening and minimal upper lobe emphysematous changes. Small focus of ground-glass opacity in the RIGHT upper lobe 10 mm diameter image 16. 2 mm RIGHT upper lobe subpleural nodule image 27. Remaining lungs clear. No pleural effusion or pneumothorax. Review of the MIP images confirms the above findings. IMPRESSION: No evidence of pulmonary embolism. Scattered atherosclerotic disease. Minimal pericardial fluid. Bronchitic and minimal emphysematous changes. Small focus of ground-glass opacity in RIGHT upper lobe 10 mm diameter ; initial follow-up by chest CT without contrast is recommended in 3 months to confirm persistence, if clinically indicated. This recommendation follows the consensus statement: Recommendations for the Management of Subsolid  Pulmonary Nodules Detected at CT: A Statement from the [place] as published in Radiology 2013; 266:304-317. Electronically Signed   By: Lavonia Dana M.D.   On: 05/15/2015 19:55    Assessment/Plan Principal Problem:   Hyponatremia Active Problems:   UTI (lower urinary tract infection)   Acute bronchitis with bronchospasm   Hypertension   Weakness   Essential hypertension   GERD (gastroesophageal reflux disease)   Cough   RLS (restless legs syndrome)  Hyponatremia: Patient has recurrent hyponatremia. In previous admission her sodium was 119 on admission, which responded to IV normal saline treatment, and improved to 132 at discharge. Today her sodium is 113. This is most likely due to poor oral intake, however patient has continuation of respiratory symptoms, need to rule out a possibility of pulmonary malignancy-induced SIADH. Patient is an minimally confused, but oriented 3. Hemodynamically stable.  - will admit to tele bed - Frequent and neuro check - IV normal saline, 100 mL per hour - BMP every 4 hours to monitoring sodium level, and void over correction - random  cortisol level.  - TSH.  - Urine osmolality and serum osmolality; and urine sodium level - f/u CTA of chest which was ordered by EDP  Acute encephalopathy: Patient has very mild confusion, still oriented 3. Most likely due to multifactorial etiology, including hyponatremia, bronchitis, deconditioning and delirium -Treat an underlying issues -Frequent neuro check -PT OT  Acute bronchitis with bronchospasm: Patient completed course of Levaquin which was for UTI. She still has wheezing, cough and mild shortness of breath. Patient is not septic. Hemodynamically stable. -Nebulizers: scheduled Duoneb and prn albuterol -Solu-Medrol 60 mg IV daily  -Mucinex for cough  -sputum culture, respiratory virus panel  HTN -continue amlodipine, losartan, metoprolol  Restless legs syndrome: started on low dose requip in previous admission with significant improvement of symptoms. -Continue Requip  GERD: -Protonix  DVT ppx: SQ Heparin     Code Status: Full code Family Communication:   Yes, patient's daughter at bed side Disposition Plan: Admit to inpatient   Date of Service 05/15/2015    Ivor Costa [facility] Pager [phone]  If 7PM-7AM, please contact night-coverage [email or web address] Password [email or web address] 05/15/2015, 8:12 PM

## 2015-05-15 NOTE — ED Notes (Signed)
Pt gone to Xray 

## 2015-05-15 NOTE — ED Notes (Signed)
Bed: WA04 Expected date:  Expected time:  Means of arrival:  Comments: hold

## 2015-05-16 ENCOUNTER — Inpatient Hospital Stay (HOSPITAL_COMMUNITY): Payer: Medicare Other

## 2015-05-16 ENCOUNTER — Ambulatory Visit (HOSPITAL_COMMUNITY): Payer: Medicare Other

## 2015-05-16 DIAGNOSIS — R938 Abnormal findings on diagnostic imaging of other specified body structures: Secondary | ICD-10-CM

## 2015-05-16 DIAGNOSIS — R079 Chest pain, unspecified: Secondary | ICD-10-CM

## 2015-05-16 DIAGNOSIS — E871 Hypo-osmolality and hyponatremia: Secondary | ICD-10-CM

## 2015-05-16 DIAGNOSIS — R911 Solitary pulmonary nodule: Secondary | ICD-10-CM

## 2015-05-16 LAB — OSMOLALITY, URINE: Osmolality, Ur: 283 mOsm/kg — ABNORMAL LOW (ref 300–900)

## 2015-05-16 LAB — BASIC METABOLIC PANEL
ANION GAP: 10 (ref 5–15)
ANION GAP: 10 (ref 5–15)
ANION GAP: 9 (ref 5–15)
Anion gap: 10 (ref 5–15)
Anion gap: 9 (ref 5–15)
BUN: 11 mg/dL (ref 6–20)
BUN: 10 mg/dL (ref 6–20)
BUN: 10 mg/dL (ref 6–20)
BUN: 11 mg/dL (ref 6–20)
BUN: 11 mg/dL (ref 6–20)
CALCIUM: 8.3 mg/dL — AB (ref 8.9–10.3)
CALCIUM: 8.4 mg/dL — AB (ref 8.9–10.3)
CALCIUM: 8.6 mg/dL — AB (ref 8.9–10.3)
CHLORIDE: 85 mmol/L — AB (ref 101–111)
CHLORIDE: 85 mmol/L — AB (ref 101–111)
CHLORIDE: 82 mmol/L — AB (ref 101–111)
CO2: 21 mmol/L — AB (ref 22–32)
CO2: 22 mmol/L (ref 22–32)
CO2: 23 mmol/L (ref 22–32)
CO2: 23 mmol/L (ref 22–32)
CO2: 24 mmol/L (ref 22–32)
CREATININE: 0.64 mg/dL (ref 0.44–1.00)
CREATININE: 0.61 mg/dL (ref 0.44–1.00)
Calcium: 8.8 mg/dL — ABNORMAL LOW (ref 8.9–10.3)
Calcium: 8.4 mg/dL — ABNORMAL LOW (ref 8.9–10.3)
Chloride: 83 mmol/L — ABNORMAL LOW (ref 101–111)
Chloride: 84 mmol/L — ABNORMAL LOW (ref 101–111)
Creatinine, Ser: 0.58 mg/dL (ref 0.44–1.00)
Creatinine, Ser: 0.62 mg/dL (ref 0.44–1.00)
Creatinine, Ser: 0.81 mg/dL (ref 0.44–1.00)
GFR calc Af Amer: >60 mL/min (ref >60)
GFR calc Af Amer: >60 mL/min (ref >60)
GFR calc non Af Amer: >60 mL/min (ref >60)
GFR calc non Af Amer: >60 mL/min (ref >60)
GFR calc non Af Amer: >60 mL/min (ref >60)
GFR calc non Af Amer: >60 mL/min (ref >60)
GFR calc non Af Amer: >60 mL/min (ref >60)
GLUCOSE: 139 mg/dL — AB (ref 65–99)
GLUCOSE: 117 mg/dL — AB (ref 65–99)
Glucose, Bld: 147 mg/dL — ABNORMAL HIGH (ref 65–99)
Glucose, Bld: 165 mg/dL — ABNORMAL HIGH (ref 65–99)
Glucose, Bld: 128 mg/dL — ABNORMAL HIGH (ref 65–99)
POTASSIUM: 3 mmol/L — AB (ref 3.5–5.1)
Potassium: 3.2 mmol/L — ABNORMAL LOW (ref 3.5–5.1)
Potassium: 3.2 mmol/L — ABNORMAL LOW (ref 3.5–5.1)
Potassium: 3.3 mmol/L — ABNORMAL LOW (ref 3.5–5.1)
Potassium: 3.4 mmol/L — ABNORMAL LOW (ref 3.5–5.1)
SODIUM: 117 mmol/L — AB (ref 135–145)
Sodium: 112 mmol/L — CL (ref 135–145)
Sodium: 117 mmol/L — CL (ref 135–145)
Sodium: 117 mmol/L — CL (ref 135–145)
Sodium: 117 mmol/L — CL (ref 135–145)

## 2015-05-16 LAB — PROTIME-INR
INR: 1.11 (ref 0.00–1.49)
PROTHROMBIN TIME: 14.5 s (ref 11.6–15.2)

## 2015-05-16 LAB — CBC
HCT: 35.9 % — ABNORMAL LOW (ref 36.0–46.0)
HEMOGLOBIN: 13 g/dL (ref 12.0–15.0)
MCH: 28.4 pg (ref 26.0–34.0)
MCHC: 36.2 g/dL — ABNORMAL HIGH (ref 30.0–36.0)
MCV: 78.6 fL (ref 78.0–100.0)
PLATELETS: 173 10*3/uL (ref 150–400)
RBC: 4.57 MIL/uL (ref 3.87–5.11)
RDW: 12.1 % (ref 11.5–15.5)
WBC: 3.4 10*3/uL — AB (ref 4.0–10.5)

## 2015-05-16 LAB — TSH: TSH: 0.92 u[IU]/mL (ref 0.350–4.500)

## 2015-05-16 LAB — PHOSPHORUS: Phosphorus: 2.8 mg/dL (ref 2.5–4.6)

## 2015-05-16 LAB — MAGNESIUM: Magnesium: 1.7 mg/dL (ref 1.7–2.4)

## 2015-05-16 LAB — OSMOLALITY: Osmolality: 241 mOsm/kg — CL (ref 275–295)

## 2015-05-16 LAB — CORTISOL-AM, BLOOD: Cortisol - AM: 13.1 ug/dL (ref 6.7–22.6)

## 2015-05-16 LAB — SODIUM, URINE, RANDOM: Sodium, Ur: 36 mmol/L

## 2015-05-16 MED ORDER — PIPERACILLIN-TAZOBACTAM 3.375 G IVPB: 3.3750 g | Freq: Three times a day (TID) | INTRAVENOUS | Status: DC

## 2015-05-16 MED ORDER — GADOBENATE DIMEGLUMINE 529 MG/ML IV SOLN
10.0000 mL | Freq: Once | INTRAVENOUS | Status: AC | PRN
  Administered 2015-05-16: 10 mL via INTRAVENOUS

## 2015-05-16 MED ORDER — DOXYCYCLINE HYCLATE 100 MG PO TABS
100.0000 mg | ORAL_TABLET | Freq: Two times a day (BID) | ORAL | Status: DC
  Administered 2015-05-16 – 2015-05-17 (×2): 100 mg via ORAL
  Filled 2015-05-16 (×3): qty 1

## 2015-05-16 MED ORDER — POTASSIUM CHLORIDE CRYS ER 20 MEQ PO TBCR
30.0000 meq | EXTENDED_RELEASE_TABLET | Freq: Once | ORAL | Status: AC
  Administered 2015-05-16: 30 meq via ORAL
  Filled 2015-05-16: qty 1

## 2015-05-16 MED ORDER — IPRATROPIUM-ALBUTEROL 0.5-2.5 (3) MG/3ML IN SOLN
3.0000 mL | Freq: Two times a day (BID) | RESPIRATORY_TRACT | Status: DC
  Administered 2015-05-17 – 2015-05-18 (×3): 3 mL via RESPIRATORY_TRACT
  Filled 2015-05-16 (×4): qty 3

## 2015-05-16 MED ORDER — IOHEXOL 300 MG/ML  SOLN
25.0000 mL | INTRAMUSCULAR | Status: AC
  Administered 2015-05-16 (×2): 25 mL via ORAL

## 2015-05-16 MED ORDER — IOHEXOL 300 MG/ML  SOLN
100.0000 mL | Freq: Once | INTRAMUSCULAR | Status: AC | PRN
  Administered 2015-05-16: 100 mL via INTRAVENOUS

## 2015-05-16 MED ORDER — ENSURE ENLIVE PO LIQD
237.0000 mL | Freq: Two times a day (BID) | ORAL | Status: DC
  Administered 2015-05-17 – 2015-05-22 (×11): 237 mL via ORAL

## 2015-05-16 NOTE — Progress Notes (Signed)
[facility] PROGRESS NOTE  Cassidy Ellison [id number]:[id number] DOB: [birth year] DOA: 05/15/2015 PCP: Antony Blackbird, MD  Summary 05/16/15: Pleasant 82 year old female with recurrent hyponatremia raising possibility of SIADH. Also has abnormal CT chest-?infection/?neoplasticprocess.Will obtain brain mri/2D echo/ct abdomen/pelvis, consult pulmonary/nephrology, give Doxycycline for now, and restrict fluid intake. Give regular diet. Will defer hyponatremia management to nephrology. Discussed care pan with daughter at bedside. Plan Hyponatremia/Acute bronchitis with bronchospasm/Acute encephalopathy/Weakness/Cough  Brain mri/2D echo/Ct abdomen pelvis  D/c NS  Fluid restriction  Regular diet  Doxycycline  Consult pulmonary/Nephrology Essential hypertension/GERD (gastroesophageal reflux disease)/RLS (restless legs syndrome)  Continue current management  Agree with holding Losartan in view of poor oral intake Code Status: Full Code Family Communication: Daughter at bedside Disposition Plan: ?Home eventually   Consultants:  Nephrology  Pulmonary  Procedures:    Antibiotics:  Doxycycline  HPI/Subjective: Feels better.  Objective: Filed Vitals:   05/16/15 0500 05/16/15 1953  BP: 149/72 127/67  Pulse: 82 74  Temp: 98.3 F (36.8 C) 97.5 F (36.4 C)  Resp: 18 18    Intake/Output Summary (Last 24 hours) at 05/16/15 2348 Last data filed at 05/16/15 1301  Gross per 24 hour  Intake    360 ml  Output    550 ml  Net   -190 ml   Filed Weights   05/15/15 2035  Weight: 61.4 kg (135 lb 5.8 oz)    Exam:   General:  Comfortable at rest.  Cardiovascular: S1-S2 normal. No murmurs. Pulse regular.  Respiratory: Good air entry bilaterally. No rhonchi or rales.  Abdomen: Soft and nontender. Normal bowel sounds. No organomegaly.  Musculoskeletal: No pedal edema   Neurological: Intact  Data Reviewed: Basic Metabolic Panel:  Recent Labs Lab 05/11/15 1532  05/15/15 2345  05/16/15 0403 05/16/15 0745 05/16/15 1205 05/16/15 1614  NA 119*  < > 117* 117* 117* 117* 112*  K 3.5  < > 3.0* 3.2* 3.2* 3.3* 3.4*  CL 87*  < > 83* 84* 85* 85* 82*  CO2 22  < > '24 23 23 22 '$ 21*  GLUCOSE 129*  < > 117* 139* 147* 165* 128*  BUN 10  < > '11 11 10 11 10  '$ CREATININE 0.64  < > 0.62 0.64 0.61 0.81 0.58  CALCIUM 8.6*  < > 8.6* 8.4* 8.3* 8.8* 8.4*  MG 1.4*  --   --  1.7  --   --   --   PHOS 2.4*  --   --  2.8  --   --   --   < > = values in this interval not displayed. Liver Function Tests:  Recent Labs Lab 05/11/15 1532 05/12/15 0512 05/15/15 1815  AST '30 30 27  '$ ALT '18 15 19  '$ ALKPHOS 65 55 68  BILITOT 0.6 0.4 1.1  PROT 6.8 6.2* 6.8  ALBUMIN 3.7 3.2* 3.5   No results for input(s): LIPASE, AMYLASE in the last 168 hours. No results for input(s): AMMONIA in the last 168 hours. CBC:  Recent Labs Lab 05/11/15 1532 05/12/15 0512 05/13/15 0511 05/15/15 1815 05/16/15 0403  WBC 4.5 1.7* 5.1 5.5 3.4*  NEUTROABS 2.6 1.1* 3.2 4.0  --   HGB 13.2 11.8* 13.0 13.7 13.0  HCT 37.3 33.8* 38.7 37.7 35.9*  MCV 80.4 79.9 83.8 78.5 78.6  PLT 120* 122* 153 170 173   Cardiac Enzymes:  Recent Labs Lab 05/15/15 1815  TROPONINI <0.03   BNP (last 3 results) No results for input(s): BNP in the last 8760  hours.  ProBNP (last 3 results) No results for input(s): PROBNP in the last 8760 hours.  CBG: No results for input(s): GLUCAP in the last 168 hours.  No results found for this or any previous visit (from the past 240 hour(s)).   Studies: Dg Chest 2 View  05/15/2015  CLINICAL DATA:  Increasing weakness, confusion and anorexia for 5 days, diarrhea, and productive cough for 1 day, recent admission for dehydration, hyponatremia, and UTI, history hypertension, former smoker EXAM: CHEST  2 VIEW COMPARISON:  05/11/2015 FINDINGS: Upper normal heart size. Calcified tortuous aorta. Mediastinal contours and pulmonary vascularity normal. Lungs mildly hyperinflated but clear. No pleural  effusion or pneumothorax. Bones demineralized. IMPRESSION: Mild hyperinflation without acute infiltrate. Electronically Signed   By: Lavonia Dana M.D.   On: 05/15/2015 18:00   Ct Angio Chest Pe W/cm &/or Wo Cm  05/15/2015  CLINICAL DATA:  Increasing weakness, confusion and anorexia for 5 days, diarrhea and productive cough beginning today, recent dehydration, hyponatremia and UTI, hypertension, question pulmonary embolism EXAM: CT ANGIOGRAPHY CHEST WITH CONTRAST TECHNIQUE: Multidetector CT imaging of the chest was performed using the standard protocol during bolus administration of intravenous contrast. Multiplanar CT image reconstructions and MIPs were obtained to evaluate the vascular anatomy. CONTRAST:  150m OMNIPAQUE IOHEXOL 350 MG/ML SOLN IV COMPARISON:  None. FINDINGS: Scattered atherosclerotic calcifications aorta, coronary arteries and proximal great vessels. Aorta normal caliber without aneurysm or dissection. Minimal pericardial effusion. Pulmonary arteries well opacified and patent. No evidence of pulmonary embolism. No thoracic adenopathy. Slight thickening in the adrenal glands without discrete mass. Remaining visualized upper abdomen unremarkable. Bones demineralized with scattered degenerative disc disease changes of thoracic spine. Peripheral interstitial thickening question fibrosis in RIGHT lower lobe. Central peribronchial thickening and minimal upper lobe emphysematous changes. Small focus of ground-glass opacity in the RIGHT upper lobe 10 mm diameter image 16. 2 mm RIGHT upper lobe subpleural nodule image 27. Remaining lungs clear. No pleural effusion or pneumothorax. Review of the MIP images confirms the above findings. IMPRESSION: No evidence of pulmonary embolism. Scattered atherosclerotic disease. Minimal pericardial fluid. Bronchitic and minimal emphysematous changes. Small focus of ground-glass opacity in RIGHT upper lobe 10 mm diameter ; initial follow-up by chest CT without contrast is  recommended in 3 months to confirm persistence, if clinically indicated. This recommendation follows the consensus statement: Recommendations for the Management of Subsolid Pulmonary Nodules Detected at CT: A Statement from the [place] published in Radiology 2013; 266:304-317. Electronically Signed   By: MLavonia DanaM.D.   On: 05/15/2015 19:55   Mr BJeri CosWSPContrast  05/16/2015  CLINICAL DATA:  Hyponatremia. Increasing confusion and weakness. Anorexia. Diarrhea and productive cough. EXAM: MRI HEAD WITHOUT AND WITH CONTRAST TECHNIQUE: Multiplanar, multiecho pulse sequences of the brain and surrounding structures were obtained without and with intravenous contrast. CONTRAST:  17mMULTIHANCE GADOBENATE DIMEGLUMINE 529 MG/ML IV SOLN COMPARISON:  None. FINDINGS: There is no evidence of acute infarct, intracranial hemorrhage, mass, midline shift, or extra-axial fluid collection. There is moderate cerebral atrophy. Small foci of T2 hyperintensity scattered throughout the cerebral white matter bilaterally are nonspecific but compatible with mild chronic small vessel ischemic disease. Minimal T2 hyperintensity in the pons likely reflects chronic small vessel ischemia, without evidence of osmotic demyelination. No abnormal enhancement is identified. Prior bilateral cataract extraction is noted. No significant inflammatory disease is seen in the paranasal sinuses or mastoid air cells. The Major intracranial vascular flow voids are preserved. IMPRESSION: 1. No acute intracranial abnormality. 2.  Mild chronic small vessel ischemic disease and moderate cerebral atrophy. Electronically Signed   By: Logan Bores M.D.   On: 05/16/2015 15:27   Ct Abdomen Pelvis W Contrast  05/16/2015  CLINICAL DATA:  Hospitalized January 1st through May 13, 2015 for urinary tract infection and viral gastritis, persistent hyponatremia EXAM: CT ABDOMEN AND PELVIS WITH CONTRAST TECHNIQUE: Multidetector CT imaging of the abdomen  and pelvis was performed using the standard protocol following bolus administration of intravenous contrast. CONTRAST:  12m OMNIPAQUE IOHEXOL 300 MG/ML  SOLN COMPARISON:  None. FINDINGS: Lower chest:  No acute findings Hepatobiliary: Status post cholecystectomy. Mild intrahepatic biliary dilatation and mild to moderate extrahepatic biliary dilatation likely related to prior cholecystectomy. Pancreas: Negative Spleen: Negative Adrenals/Urinary Tract: 19 mm right adrenal nodule. 13 mm left adrenal nodule. Calcification of inferior aspect of left adrenal gland. Sub cm low-attenuation round and oval lesions involving both kidneys too small to characterize, most consistent with cysts. Minimal perinephric fluid/ inflammation on the left possibly related to pyelonephritis as stated in clinical indication. No hydronephrosis. Bladder contains relatively hyper attenuating fluid, likely residual excreted contrast from May 15, 2015 CT thorax examination. Stomach/Bowel: Stomach appear normal. Small bowel appears normal. There is moderate diverticulosis of the sigmoid colon. There is trace fluid in the inferior pelvis in the region of the sigmoid colon but there is no evidence of inflammatory change. There is mild diverticulosis of the descending colon as well. Vascular/Lymphatic: Moderate atherosclerotic aortoiliac calcification Reproductive: Reproductive organs not identified presumably surgically absent. Other: As stated above, trace free fluid in the dependent inferior pelvis. Musculoskeletal: No acute musculoskeletal findings IMPRESSION: No specific acute findings. Minimal perinephric fluid or inflammation on the left could reflect recent pyelonephritis. Mild biliary dilatation likely related to status post cholecystectomy. Sigmoid diverticulosis without specific evidence of diverticulitis. Trace free fluid in the inferior pelvis is nonspecific. Electronically Signed   By: RSkipper ClicheM.D.   On: 05/16/2015 18:45     Scheduled Meds: . amLODipine  10 mg Oral Daily  . aspirin  81 mg Oral Daily  . calcium-vitamin D  1 tablet Oral Daily  . cholecalciferol  1,000 Units Oral Daily  . dextromethorphan-guaiFENesin  1 tablet Oral BID  . doxycycline  100 mg Oral Q12H  . [START ON 05/17/2015] feeding supplement (ENSURE ENLIVE)  237 mL Oral BID BM  . heparin  5,000 Units Subcutaneous 3 times per day  . [START ON 05/17/2015] ipratropium-albuterol  3 mL Nebulization BID  . methylPREDNISolone (SOLU-MEDROL) injection  60 mg Intravenous Q24H  . metoprolol  100 mg Oral Daily  . multivitamin with minerals  1 tablet Oral Daily  . omega-3 acid ethyl esters  2 g Oral Daily  . pantoprazole  40 mg Oral Daily  . rOPINIRole  0.25 mg Oral QHS  . sodium chloride  3 mL Intravenous Q12H  . vitamin C  500 mg Oral Daily   Continuous Infusions:    Time spent: 25 minutes    Rebeca Valdivia  [facility] Pager [phone] If 7PM-7AM, please contact night-coverage at [email or web address], password [email or web address]/10/2015, 11:48 PM  LOS: 1 day

## 2015-05-16 NOTE — Consult Note (Signed)
Cassidy Ellison is an 82 y.o. female referred by Dr Sanjuana Letters   Chief Complaint: Hyponatremia HPI: 82yo WF admitted 05/15/15 for weakness, nausea and poor PO intake.  Was just DC 05/13/15 after admission for "viral gastroenteritis", UTI and hyponatremia.  SNa was 119 on that admission but increased to 132 at DC.  Now SNa 113 on admission yest and increased to 117 today.  SNa was 132 back in 6/16. Cortisol and TSH NL.  CXR unremarkable.  UOsm this admission was 283 and Sosm 241.  PO intake has been poor though she has been taking in some fluids though less than usual.  MRI head unremarkable except for some age related changes.  Echo NL.  Past Medical History  Diagnosis Date  . Hypertension   . GERD (gastroesophageal reflux disease)   . Carpal tunnel syndrome     left  . DVT (deep venous thrombosis) ([place])   . RLS (restless legs syndrome)     Past Surgical History  Procedure Laterality Date  . Abdominal hysterectomy    . Gallbladder surgery    . Carpal tunnel release Left 10/31/2014    Procedure: LEFT CARPAL TUNNEL RELEASE;  Surgeon: Leanora Cover, MD;  Location: [facility];  Service: Orthopedics;  Laterality: Left;    No family history on file. Social History:  reports that she has quit smoking. She does not have any smokeless tobacco history on file. She reports that she does not drink alcohol or use illicit drugs.  Allergies:  Allergies  Allergen Reactions  . Adhesive [Tape] Itching  . Chlorthalidone Other (See Comments)    creatinine  increases  . Hctz [Hydrochlorothiazide] Other (See Comments)    CREATNINE INCREASES  . Keflex [Cephalexin]   . Oxsoralen [Methoxsalen]     Medications Prior to Admission  Medication Sig Dispense Refill  . amLODipine (NORVASC) 10 MG tablet Take 10 mg by mouth daily.    Marland Kitchen aspirin 81 MG tablet Take 81 mg by mouth daily.    . Calcium Carbonate-Vit D-Min (CALCIUM 1200 PO) Take 1,200 mg by mouth daily.    . Cholecalciferol (VITAMIN D-3) 1000 units  CAPS Take 1,000 Units by mouth daily.    Marland Kitchen Dextromethorphan-Guaifenesin 5-100 MG/5ML LIQD Take 5 mLs by mouth every 4 (four) hours as needed (cough).    . fish oil-omega-3 fatty acids 1000 MG capsule Take 2,000 mg by mouth daily.    Marland Kitchen Ketotifen Fumarate (ITCHY EYE DROPS OP) Apply 2 drops to eye 2 (two) times daily as needed (itching eye).    Marland Kitchen losartan (COZAAR) 100 MG tablet Take 100 mg by mouth daily.    Marland Kitchen omeprazole (PRILOSEC) 20 MG capsule Take 20 mg by mouth daily.    . ondansetron (ZOFRAN ODT) 4 MG disintegrating tablet Take 1 tablet (4 mg total) by mouth every 8 (eight) hours as needed for nausea or vomiting. 20 tablet 0  . ONE DAILY MULTIPLE VITAMIN PO Take by mouth.    . polyethylene glycol (MIRALAX / GLYCOLAX) packet Take 17 g by mouth daily as needed for mild constipation.    . RED YEAST RICE EXTRACT PO Take 1,200 mg by mouth daily.    Marland Kitchen rOPINIRole (REQUIP) 0.25 MG tablet Take 1 tablet (0.25 mg total) by mouth at bedtime. 30 tablet 0  . TOPROL XL 200 MG 24 hr tablet Take 100 mg by mouth daily.     Marland Kitchen triamcinolone cream (KENALOG) 0.1 % Apply 1 application topically 2 (two) times daily as needed (  itch).   1  . vitamin C (ASCORBIC ACID) 500 MG tablet Take 500 mg by mouth daily.    Marland Kitchen levofloxacin (LEVAQUIN) 250 MG tablet Take 1 tablet (250 mg total) by mouth daily. For 2days (Patient not taking: Reported on 05/15/2015) 2 tablet 0     Lab Results: UA: from 05/11/15 neg protein, no RBC, 6-30 WBC.  Uosm 283   Recent Labs  05/15/15 1815 05/16/15 0403  WBC 5.5 3.4*  HGB 13.7 13.0  HCT 37.7 35.9*  PLT 170 173   BMET  Recent Labs  05/16/15 0403 05/16/15 0745 05/16/15 1205  NA 117* 117* 117*  K 3.2* 3.2* 3.3*  CL 84* 85* 85*  CO2 '23 23 22  '$ GLUCOSE 139* 147* 165*  BUN '11 10 11  '$ CREATININE 0.64 0.61 0.81  CALCIUM 8.4* 8.3* 8.8*  PHOS 2.8  --   --    LFT  Recent Labs  05/15/15 1815  PROT 6.8  ALBUMIN 3.5  AST 27  ALT 19  ALKPHOS 68  BILITOT 1.1   Dg Chest 2  View  05/15/2015  CLINICAL DATA:  Increasing weakness, confusion and anorexia for 5 days, diarrhea, and productive cough for 1 day, recent admission for dehydration, hyponatremia, and UTI, history hypertension, former smoker EXAM: CHEST  2 VIEW COMPARISON:  05/11/2015 FINDINGS: Upper normal heart size. Calcified tortuous aorta. Mediastinal contours and pulmonary vascularity normal. Lungs mildly hyperinflated but clear. No pleural effusion or pneumothorax. Bones demineralized. IMPRESSION: Mild hyperinflation without acute infiltrate. Electronically Signed   By: Lavonia Dana M.D.   On: 05/15/2015 18:00   Ct Angio Chest Pe W/cm &/or Wo Cm  05/15/2015  CLINICAL DATA:  Increasing weakness, confusion and anorexia for 5 days, diarrhea and productive cough beginning today, recent dehydration, hyponatremia and UTI, hypertension, question pulmonary embolism EXAM: CT ANGIOGRAPHY CHEST WITH CONTRAST TECHNIQUE: Multidetector CT imaging of the chest was performed using the standard protocol during bolus administration of intravenous contrast. Multiplanar CT image reconstructions and MIPs were obtained to evaluate the vascular anatomy. CONTRAST:  143m OMNIPAQUE IOHEXOL 350 MG/ML SOLN IV COMPARISON:  None. FINDINGS: Scattered atherosclerotic calcifications aorta, coronary arteries and proximal great vessels. Aorta normal caliber without aneurysm or dissection. Minimal pericardial effusion. Pulmonary arteries well opacified and patent. No evidence of pulmonary embolism. No thoracic adenopathy. Slight thickening in the adrenal glands without discrete mass. Remaining visualized upper abdomen unremarkable. Bones demineralized with scattered degenerative disc disease changes of thoracic spine. Peripheral interstitial thickening question fibrosis in RIGHT lower lobe. Central peribronchial thickening and minimal upper lobe emphysematous changes. Small focus of ground-glass opacity in the RIGHT upper lobe 10 mm diameter image 16. 2 mm  RIGHT upper lobe subpleural nodule image 27. Remaining lungs clear. No pleural effusion or pneumothorax. Review of the MIP images confirms the above findings. IMPRESSION: No evidence of pulmonary embolism. Scattered atherosclerotic disease. Minimal pericardial fluid. Bronchitic and minimal emphysematous changes. Small focus of ground-glass opacity in RIGHT upper lobe 10 mm diameter ; initial follow-up by chest CT without contrast is recommended in 3 months to confirm persistence, if clinically indicated. This recommendation follows the consensus statement: Recommendations for the Management of Subsolid Pulmonary Nodules Detected at CT: A Statement from the FWilsonas published in Radiology 2013; 266:304-317. Electronically Signed   By: MLavonia DanaM.D.   On: 05/15/2015 19:55   Mr BJeri CosWJQContrast  05/16/2015  CLINICAL DATA:  Hyponatremia. Increasing confusion and weakness. Anorexia. Diarrhea and productive cough. EXAM: MRI HEAD WITHOUT  AND WITH CONTRAST TECHNIQUE: Multiplanar, multiecho pulse sequences of the brain and surrounding structures were obtained without and with intravenous contrast. CONTRAST:  40m MULTIHANCE GADOBENATE DIMEGLUMINE 529 MG/ML IV SOLN COMPARISON:  None. FINDINGS: There is no evidence of acute infarct, intracranial hemorrhage, mass, midline shift, or extra-axial fluid collection. There is moderate cerebral atrophy. Small foci of T2 hyperintensity scattered throughout the cerebral white matter bilaterally are nonspecific but compatible with mild chronic small vessel ischemic disease. Minimal T2 hyperintensity in the pons likely reflects chronic small vessel ischemia, without evidence of osmotic demyelination. No abnormal enhancement is identified. Prior bilateral cataract extraction is noted. No significant inflammatory disease is seen in the paranasal sinuses or mastoid air cells. The Major intracranial vascular flow voids are preserved. IMPRESSION: 1. No acute intracranial  abnormality. 2. Mild chronic small vessel ischemic disease and moderate cerebral atrophy. Electronically Signed   By: ALogan BoresM.D.   On: 05/16/2015 15:27    ROS: No change in vision No SOB No CP No abd pain No neuropathic sxs No dysuria   PHYSICAL EXAM: Blood pressure 149/72, pulse 82, temperature 98.3 F (36.8 C), temperature source Oral, resp. rate 18, height '5\' 5"'$  (1.651 m), weight 61.4 kg (135 lb 5.8 oz), SpO2 96 %. HEENT: PERRLA EOMI NECK:No JVD LUNGS:Scattered insp/exsp wheezes CARDIAC:RRR wo MRG ABD:+ BS NTND No HSM EXT:No CCE NEURO:CNI Ox3 no asterixis  Assessment: 1. Hyponatremia.  Given the fact that her Sna was sl low 6/16 and being 82yo, I suspect she has a reset osmostat but Sna has certainly worsened with this acute illness and her urine and serum OSM suggests SIADH component.  I also think her poor PO intake makes it difficult for her to excrete a dilute urine PLAN: 1. Fluid restrict 2. She needs to eat and I discussed this with her, at least a protein supplement 3. Daily SNa or BID.  I don't think she needs q 4hr labs 4. Given her propensity for poor PO intake, I would hold the losartan for now 5. Will see how she responds to fluid restriction before considering lasix or demeclocycline or AVP receptor blocker 6. Repeat UA as you have done 7. Record I/O   Urbano Milhouse T 05/16/2015, 3:33 PM

## 2015-05-16 NOTE — Progress Notes (Signed)
CRITICAL VALUE ALERT  Critical value received:  Sodium 117  Date of notification:05/16/15  Time of notification:  0049  Critical value read back:yes  Nurse who received alert:Deshante Cassell  MD notified (1st page):  yes  Time of first page: [phone]

## 2015-05-16 NOTE — Progress Notes (Signed)
  Echocardiogram 2D Echocardiogram has been performed.  Cassidy Ellison 05/16/2015, 3:11 PM

## 2015-05-16 NOTE — Progress Notes (Signed)
CRITICAL VALUE ALERT  Critical value received: Na 117  Date of notification:  05/16/15  Time of notification: 0354  Critical value read back yes  Nurse who received alert: Dellie Catholic  MD notified (1st page): yes  Time of first page: [phone]

## 2015-05-16 NOTE — Evaluation (Signed)
Occupational Therapy Evaluation Patient Details Name: Cassidy Ellison MRN: [id number] DOB: [birth year] Today's Date: 05/16/2015    History of Present Illness 82 yo female admitted with hyponatremia. Hx of HTN, DVT, RLS   Clinical Impression   Pt admitted with weakness. Pt currently with functional limitations due to the deficits listed below (see OT Problem List).  Pt will benefit from skilled OT to increase their safety and independence with ADL and functional mobility for ADL to facilitate discharge to venue listed below.      Follow Up Recommendations  Home health OT;Supervision - Intermittent    Equipment Recommendations  Tub/shower seat    Recommendations for Other Services       Precautions / Restrictions Precautions Precautions: Fall;Other (comment) Precaution Comments: droplet Restrictions Weight Bearing Restrictions: No      Mobility Bed Mobility Overal bed mobility: Modified Independent             General bed mobility comments: pt in chair  Transfers Overall transfer level: Needs assistance Equipment used: 4-wheeled walker Transfers: Sit to/from Stand Sit to Stand: Supervision         General transfer comment: for safety, proper use of rollator (locking/unlocking brakes), hand placement    Balance           Standing balance support: During functional activity                                ADL Overall ADL's : Needs assistance/impaired Eating/Feeding: Set up;Sitting   Grooming: Set up;Sitting   Upper Body Bathing: Set up;Sitting   Lower Body Bathing: Minimal assistance;Sit to/from stand   Upper Body Dressing : Set up;Sitting       Toilet Transfer: Minimal assistance;RW;Regular Toilet;Ambulation   Toileting- Clothing Manipulation and Hygiene: Minimal assistance;Sit to/from stand;Cueing for sequencing;Cueing for safety         General ADL Comments: pt will benefit from OT at ILF to A with using rollater and safety in  her apartment               Pertinent Vitals/Pain Pain Assessment: No/denies pain     Hand Dominance     Extremity/Trunk Assessment Upper Extremity Assessment Upper Extremity Assessment: Generalized weakness   Lower Extremity Assessment Lower Extremity Assessment: Generalized weakness   Cervical / Trunk Assessment Cervical / Trunk Assessment: Normal   Communication Communication Communication: HOH   Cognition Arousal/Alertness: Awake/alert Behavior During Therapy: WFL for tasks assessed/performed         Memory: Decreased short-term memory                        Home Living       Type of Home: Independent living facility Home Access: Level entry     Home Layout: One level               Home Equipment: Walker - 4 wheels   Additional Comments: Pt lives at [facility], independent living since Nov 2016      Prior Functioning/Environment Level of Independence: Independent with assistive device(s)        Comments: just recently began using rollator    OT Diagnosis: Generalized weakness   OT Problem List: Decreased strength;Decreased activity tolerance;Decreased cognition;Decreased safety awareness   OT Treatment/Interventions: Self-care/ADL training;DME and/or AE instruction;Therapeutic activities;Balance training;Patient/family education    OT Goals(Current goals can be found in the care plan section) Acute Rehab OT  Goals Patient Stated Goal: back to her apartment OT Goal Formulation: With patient Time For Goal Achievement: 05/30/15 Potential to Achieve Goals: Good  OT Frequency: Min 2X/week              End of Session Equipment Utilized During Treatment: Rolling walker Nurse Communication: Mobility status  Activity Tolerance: Patient tolerated treatment well Patient left: in chair;with call bell/phone within reach;with family/visitor present;with chair alarm set   Time: 6808-8110 OT Time Calculation (min): 18 min Charges:   OT General Charges $OT Visit: 1 Procedure OT Evaluation $OT Eval Low Complexity: 1 Procedure G-Codes:    Payton Mccallum D 06/13/2015, 12:04 PM

## 2015-05-16 NOTE — Progress Notes (Signed)
CRITICAL VALUE ALERT  Critical value received: NA 112  Date of notification:  05/16/2015  Time of notification:  1614  Critical value read back:yes  Nurse who received alert:  Danton Sewer   MD notified (1st page):  yes  Time of first page:  1615( in department)  MD notified (2nd page):  Time of second page:  Responding MD:  Dr. Sanjuana Letters  Time MD responded:  [phone]

## 2015-05-16 NOTE — Evaluation (Signed)
Physical Therapy Evaluation Patient Details Name: Cassidy Ellison MRN: [id number] DOB: January 22, 1922 Today's Date: 05/16/2015   History of Present Illness  82 yo female admitted with hyponatremia. Hx of HTN, DVT, RLS  Clinical Impression  On eval, pt was supervision level for mobility-walked ~350 feet with use of rollator. Pt tolerated activity well. No c/o lightheadedness during session. Recommend HHPT follow up at discharge.     Follow Up Recommendations Home health PT;Supervision - Intermittent    Equipment Recommendations  None recommended by PT    Recommendations for Other Services       Precautions / Restrictions Precautions Precautions: Fall;Other (comment) Precaution Comments: droplet Restrictions Weight Bearing Restrictions: No      Mobility  Bed Mobility Overal bed mobility: Modified Independent                Transfers Overall transfer level: Needs assistance   Transfers: Sit to/from Stand Sit to Stand: Supervision         General transfer comment: for safety, proper use of rollator (locking/unlocking brakes), hand placement  Ambulation/Gait Ambulation/Gait assistance: Supervision Ambulation Distance (Feet): 350 Feet Assistive device: 4-wheeled walker Gait Pattern/deviations: Step-through pattern     General Gait Details: supervision for safety. pt denied lightheadedness. good pace and control  Stairs            Wheelchair Mobility    Modified Rankin (Stroke Patients Only)       Balance           Standing balance support: During functional activity                                 Pertinent Vitals/Pain Pain Assessment: No/denies pain    Home Living       Type of Home: Independent living facility Home Access: Level entry     Home Layout: One level Home Equipment: Walker - 4 wheels Additional Comments: Pt lives at [facility], independent living since Nov 2016    Prior Function Level of Independence:  Independent with assistive device(s)         Comments: just recently began using rollator     Hand Dominance        Extremity/Trunk Assessment   Upper Extremity Assessment: Defer to OT evaluation           Lower Extremity Assessment: Generalized weakness      Cervical / Trunk Assessment: Normal  Communication   Communication: HOH  Cognition Arousal/Alertness: Awake/alert Behavior During Therapy: WFL for tasks assessed/performed         Memory: Decreased short-term memory              General Comments      Exercises        Assessment/Plan    PT Assessment Patient needs continued PT services  PT Diagnosis Generalized weakness;Difficulty walking   PT Problem List Decreased strength;Decreased activity tolerance;Decreased balance;Decreased mobility;Decreased knowledge of use of DME  PT Treatment Interventions DME instruction;Gait training;Functional mobility training;Therapeutic activities;Patient/family education;Balance training;Therapeutic exercise   PT Goals (Current goals can be found in the Care Plan section) Acute Rehab PT Goals Patient Stated Goal: none stated PT Goal Formulation: With patient/family Time For Goal Achievement: 05/30/15 Potential to Achieve Goals: Good    Frequency Min 3X/week   Barriers to discharge        Co-evaluation               End of  Session Equipment Utilized During Treatment: Gait belt Activity Tolerance: Patient tolerated treatment well Patient left: in bed;with call bell/phone within reach;with bed alarm set;with family/visitor present           Time: 7096-2836 PT Time Calculation (min) (ACUTE ONLY): 15 min   Charges:   PT Evaluation $PT Eval Low Complexity: 1 Procedure     PT G Codes:        Weston Anna, MPT Pager: [phone]

## 2015-05-16 NOTE — Progress Notes (Signed)
CRITICAL VALUE ALERT  Critical value received:  Sodium 117  Date of notification:  05/16/15  Time of notification:  1252  Critical value read back:yes  Nurse who received alert: Dellie Catholic  MD notified (1st page):  yes  Time of first page: [phone]

## 2015-05-16 NOTE — Progress Notes (Signed)
ANTIBIOTIC CONSULT NOTE - INITIAL  Pharmacy Consult for Zosyn Indication: HCAP  Allergies  Allergen Reactions  . Adhesive [Tape] Itching  . Chlorthalidone Other (See Comments)    creatinine  increases  . Hctz [Hydrochlorothiazide] Other (See Comments)    CREATNINE INCREASES  . Keflex [Cephalexin]   . Oxsoralen [Methoxsalen]     Patient Measurements: Height: '5\' 5"'$  (165.1 cm) Weight: 135 lb 5.8 oz (61.4 kg) IBW/kg (Calculated) : 57   Vital Signs: Temp: 98.3 F (36.8 C) (01/06 0500) Temp Source: Oral (01/06 0500) BP: 149/72 mmHg (01/06 0500) Pulse Rate: 82 (01/06 0500) Intake/Output from previous day: 01/05 0701 - 01/06 0700 In: -  Out: 850 [Urine:850] Intake/Output from this shift: Total I/O In: 360 [P.O.:360] Out: -   Labs:  Recent Labs  05/15/15 1815  05/16/15 0403 05/16/15 0745 05/16/15 1205  WBC 5.5  --  3.4*  --   --   HGB 13.7  --  13.0  --   --   PLT 170  --  173  --   --   CREATININE 0.58  < > 0.64 0.61 0.81  < > = values in this interval not displayed. Estimated Creatinine Clearance: 40.7 mL/min (by C-G formula based on Cr of 0.81). No results for input(s): VANCOTROUGH, VANCOPEAK, VANCORANDOM, GENTTROUGH, GENTPEAK, GENTRANDOM, TOBRATROUGH, TOBRAPEAK, TOBRARND, AMIKACINPEAK, AMIKACINTROU, AMIKACIN in the last 72 hours.   Microbiology: No results found for this or any previous visit (from the past 720 hour(s)).  Medical History: Past Medical History  Diagnosis Date  . Hypertension   . GERD (gastroesophageal reflux disease)   . Carpal tunnel syndrome     left  . DVT (deep venous thrombosis) ([place])   . RLS (restless legs syndrome)     Medications:  Prescriptions prior to admission  Medication Sig Dispense Refill Last Dose  . amLODipine (NORVASC) 10 MG tablet Take 10 mg by mouth daily.   05/15/2015 at Unknown time  . aspirin 81 MG tablet Take 81 mg by mouth daily.   05/15/2015 at Unknown time  . Calcium Carbonate-Vit D-Min (CALCIUM 1200 PO) Take  1,200 mg by mouth daily.   05/15/2015 at Unknown time  . Cholecalciferol (VITAMIN D-3) 1000 units CAPS Take 1,000 Units by mouth daily.   05/15/2015 at Unknown time  . Dextromethorphan-Guaifenesin 5-100 MG/5ML LIQD Take 5 mLs by mouth every 4 (four) hours as needed (cough).   05/15/2015 at Unknown time  . fish oil-omega-3 fatty acids 1000 MG capsule Take 2,000 mg by mouth daily.   05/15/2015 at Unknown time  . Ketotifen Fumarate (ITCHY EYE DROPS OP) Apply 2 drops to eye 2 (two) times daily as needed (itching eye).   05/15/2015 at Unknown time  . losartan (COZAAR) 100 MG tablet Take 100 mg by mouth daily.   05/15/2015 at Unknown time  . omeprazole (PRILOSEC) 20 MG capsule Take 20 mg by mouth daily.   05/15/2015 at Unknown time  . ondansetron (ZOFRAN ODT) 4 MG disintegrating tablet Take 1 tablet (4 mg total) by mouth every 8 (eight) hours as needed for nausea or vomiting. 20 tablet 0 05/15/2015 at Unknown time  . ONE DAILY MULTIPLE VITAMIN PO Take by mouth.   05/15/2015 at Unknown time  . polyethylene glycol (MIRALAX / GLYCOLAX) packet Take 17 g by mouth daily as needed for mild constipation.   05/15/2015 at Unknown time  . RED YEAST RICE EXTRACT PO Take 1,200 mg by mouth daily.   05/15/2015 at Unknown time  . rOPINIRole (  REQUIP) 0.25 MG tablet Take 1 tablet (0.25 mg total) by mouth at bedtime. 30 tablet 0 05/14/2015 at Unknown time  . TOPROL XL 200 MG 24 hr tablet Take 100 mg by mouth daily.    05/15/2015 at 1000  . triamcinolone cream (KENALOG) 0.1 % Apply 1 application topically 2 (two) times daily as needed (itch).   1 05/15/2015 at Unknown time  . vitamin C (ASCORBIC ACID) 500 MG tablet Take 500 mg by mouth daily.   05/15/2015 at Unknown time  . levofloxacin (LEVAQUIN) 250 MG tablet Take 1 tablet (250 mg total) by mouth daily. For 2days (Patient not taking: Reported on 05/15/2015) 2 tablet 0 Completed Course at Unknown time   Scheduled:  . amLODipine  10 mg Oral Daily  . aspirin  81 mg Oral Daily  . calcium-vitamin D  1  tablet Oral Daily  . cholecalciferol  1,000 Units Oral Daily  . dextromethorphan-guaiFENesin  1 tablet Oral BID  . heparin  5,000 Units Subcutaneous 3 times per day  . ipratropium-albuterol  3 mL Nebulization Q4H  . losartan  100 mg Oral Daily  . methylPREDNISolone (SOLU-MEDROL) injection  60 mg Intravenous Q24H  . metoprolol  100 mg Oral Daily  . multivitamin with minerals  1 tablet Oral Daily  . omega-3 acid ethyl esters  2 g Oral Daily  . pantoprazole  40 mg Oral Daily  . piperacillin-tazobactam (ZOSYN)  IV  3.375 g Intravenous Q8H  . rOPINIRole  0.25 mg Oral QHS  . sodium chloride  3 mL Intravenous Q12H  . vitamin C  500 mg Oral Daily   Infusions:   Assessment: 79 yoF with recent hospitilization from 1/1 to 1/3 with course of Levaquin for UTI.  Now MD ordering Zosyn per Rx for HCAP.   Goal of Therapy:  Treat PNA  Plan:   Zosyn 3.375 Gm IV q8h EI  F/u Scr/cultures as needed  Lawana Pai R 05/16/2015,4:11 PM

## 2015-05-16 NOTE — Consult Note (Signed)
PULMONARY / CRITICAL CARE MEDICINE   Name: Cassidy Ellison MRN: [id number] DOB: December 12, 1921    ADMISSION DATE:  05/15/2015 CONSULTATION DATE:  05/16/2015  REFERRING MD:  Dr. Sanjuana Letters  CHIEF COMPLAINT:  Weakness  HISTORY OF PRESENT ILLNESS:   82 yo female was in hospital earlier this month with N/V/D and hyponatremia.  She was tx for a UTI.  She was d/c on 05/13/15.  She had f/u with PCP and was advised to get lab and CXR.  The family didn't want to wait, so they came to ER for testing.  She was found to have hyponatremia again, and was re-admitted.  She had CT chest and this showed very small area of ground glass opacification in Rt upper lobe, and mild changes of emphysema.  For this PCCM was consulted.  She denies cough, or sputum.  No hx of hemoptysis.  She is not aware of wheezing, but others have told her that she has been wheezing some recently.  She is not having fever.  Abdominal symptoms have improved.  She denies chest pain, or skin rashes.  She quit smoking years ago.  She is from [place].  She denies hx of pneumonia, tuberculosis, asthma, or COPD.  She denies animal/bird exposure.  PAST MEDICAL HISTORY :  She  has a past medical history of Hypertension; GERD (gastroesophageal reflux disease); Carpal tunnel syndrome; DVT (deep venous thrombosis) (HCC); and RLS (restless legs syndrome).  PAST SURGICAL HISTORY: She  has past surgical history that includes Abdominal hysterectomy; Gallbladder surgery; and Carpal tunnel release (Left, 10/31/2014).  Allergies  Allergen Reactions  . Adhesive [Tape] Itching  . Chlorthalidone Other (See Comments)    creatinine  increases  . Hctz [Hydrochlorothiazide] Other (See Comments)    CREATNINE INCREASES  . Keflex [Cephalexin]   . Oxsoralen [Methoxsalen]     No current facility-administered medications on file prior to encounter.   Current Outpatient Prescriptions on File Prior to Encounter  Medication Sig  . amLODipine (NORVASC) 10 MG  tablet Take 10 mg by mouth daily.  Marland Kitchen aspirin 81 MG tablet Take 81 mg by mouth daily.  . Calcium Carbonate-Vit D-Min (CALCIUM 1200 PO) Take 1,200 mg by mouth daily.  . Cholecalciferol (VITAMIN D-3) 1000 units CAPS Take 1,000 Units by mouth daily.  Marland Kitchen Dextromethorphan-Guaifenesin 5-100 MG/5ML LIQD Take 5 mLs by mouth every 4 (four) hours as needed (cough).  . fish oil-omega-3 fatty acids 1000 MG capsule Take 2,000 mg by mouth daily.  Marland Kitchen Ketotifen Fumarate (ITCHY EYE DROPS OP) Apply 2 drops to eye 2 (two) times daily as needed (itching eye).  Marland Kitchen losartan (COZAAR) 100 MG tablet Take 100 mg by mouth daily.  Marland Kitchen omeprazole (PRILOSEC) 20 MG capsule Take 20 mg by mouth daily.  . ondansetron (ZOFRAN ODT) 4 MG disintegrating tablet Take 1 tablet (4 mg total) by mouth every 8 (eight) hours as needed for nausea or vomiting.  . ONE DAILY MULTIPLE VITAMIN PO Take by mouth.  . polyethylene glycol (MIRALAX / GLYCOLAX) packet Take 17 g by mouth daily as needed for mild constipation.  . RED YEAST RICE EXTRACT PO Take 1,200 mg by mouth daily.  Marland Kitchen rOPINIRole (REQUIP) 0.25 MG tablet Take 1 tablet (0.25 mg total) by mouth at bedtime.  . TOPROL XL 200 MG 24 hr tablet Take 100 mg by mouth daily.   Marland Kitchen triamcinolone cream (KENALOG) 0.1 % Apply 1 application topically 2 (two) times daily as needed (itch).   . vitamin C (ASCORBIC ACID) 500  MG tablet Take 500 mg by mouth daily.  Marland Kitchen levofloxacin (LEVAQUIN) 250 MG tablet Take 1 tablet (250 mg total) by mouth daily. For 2days (Patient not taking: Reported on 05/15/2015)    FAMILY HISTORY:  Her family history is negative for scurvy  SOCIAL HISTORY: She  reports that she has quit smoking. She does not have any smokeless tobacco history on file. She reports that she does not drink alcohol or use illicit drugs.  REVIEW OF SYSTEMS:   Negative except above  SUBJECTIVE:  Denies headache  VITAL SIGNS: BP 149/72 mmHg  Pulse 82  Temp(Src) 98.3 F (36.8 C) (Oral)  Resp 18  Ht 5'  5" (1.651 m)  Wt 135 lb 5.8 oz (61.4 kg)  BMI 22.53 kg/m2  SpO2 96%  INTAKE / OUTPUT: I/O last 3 completed shifts: In: -  Out: 850 [Urine:850]  PHYSICAL EXAMINATION: General:  pleasant Neuro:  Alert, normal strength, CN intact, follows commands HEENT:  Pupils reactive, no oral exudate, no LAN Cardiovascular:  Regular, no murmur Lungs:  No wheeze/rales Abdomen:  Soft, non tender Musculoskeletal:  No edema Skin:  No rashes  LABS:  BMET  Recent Labs Lab 05/16/15 0403 05/16/15 0745 05/16/15 1205  NA 117* 117* 117*  K 3.2* 3.2* 3.3*  CL 84* 85* 85*  CO2 '23 23 22  '$ BUN '11 10 11  '$ CREATININE 0.64 0.61 0.81  GLUCOSE 139* 147* 165*    Electrolytes  Recent Labs Lab 05/11/15 1532  05/16/15 0403 05/16/15 0745 05/16/15 1205  CALCIUM 8.6*  < > 8.4* 8.3* 8.8*  MG 1.4*  --  1.7  --   --   PHOS 2.4*  --  2.8  --   --   < > = values in this interval not displayed.  CBC  Recent Labs Lab 05/13/15 0511 05/15/15 1815 05/16/15 0403  WBC 5.1 5.5 3.4*  HGB 13.0 13.7 13.0  HCT 38.7 37.7 35.9*  PLT 153 170 173    Coag's  Recent Labs Lab 05/15/15 2345  INR 1.11    Sepsis Markers  Recent Labs Lab 05/11/15 1532 05/11/15 1803 05/12/15 0512  LATICACIDVEN 1.8 2.4* 1.5    ABG No results for input(s): PHART, PCO2ART, PO2ART in the last 168 hours.  Liver Enzymes  Recent Labs Lab 05/11/15 1532 05/12/15 0512 05/15/15 1815  AST '30 30 27  '$ ALT '18 15 19  '$ ALKPHOS 65 55 68  BILITOT 0.6 0.4 1.1  ALBUMIN 3.7 3.2* 3.5    Cardiac Enzymes  Recent Labs Lab 05/15/15 1815  TROPONINI <0.03    Glucose No results for input(s): GLUCAP in the last 168 hours.  Imaging Dg Chest 2 View  05/15/2015  CLINICAL DATA:  Increasing weakness, confusion and anorexia for 5 days, diarrhea, and productive cough for 1 day, recent admission for dehydration, hyponatremia, and UTI, history hypertension, former smoker EXAM: CHEST  2 VIEW COMPARISON:  05/11/2015 FINDINGS: Upper normal  heart size. Calcified tortuous aorta. Mediastinal contours and pulmonary vascularity normal. Lungs mildly hyperinflated but clear. No pleural effusion or pneumothorax. Bones demineralized. IMPRESSION: Mild hyperinflation without acute infiltrate. Electronically Signed   By: Lavonia Dana M.D.   On: 05/15/2015 18:00   Ct Angio Chest Pe W/cm &/or Wo Cm  05/15/2015  CLINICAL DATA:  Increasing weakness, confusion and anorexia for 5 days, diarrhea and productive cough beginning today, recent dehydration, hyponatremia and UTI, hypertension, question pulmonary embolism EXAM: CT ANGIOGRAPHY CHEST WITH CONTRAST TECHNIQUE: Multidetector CT imaging of the chest was performed using  the standard protocol during bolus administration of intravenous contrast. Multiplanar CT image reconstructions and MIPs were obtained to evaluate the vascular anatomy. CONTRAST:  176m OMNIPAQUE IOHEXOL 350 MG/ML SOLN IV COMPARISON:  None. FINDINGS: Scattered atherosclerotic calcifications aorta, coronary arteries and proximal great vessels. Aorta normal caliber without aneurysm or dissection. Minimal pericardial effusion. Pulmonary arteries well opacified and patent. No evidence of pulmonary embolism. No thoracic adenopathy. Slight thickening in the adrenal glands without discrete mass. Remaining visualized upper abdomen unremarkable. Bones demineralized with scattered degenerative disc disease changes of thoracic spine. Peripheral interstitial thickening question fibrosis in RIGHT lower lobe. Central peribronchial thickening and minimal upper lobe emphysematous changes. Small focus of ground-glass opacity in the RIGHT upper lobe 10 mm diameter image 16. 2 mm RIGHT upper lobe subpleural nodule image 27. Remaining lungs clear. No pleural effusion or pneumothorax. Review of the MIP images confirms the above findings. IMPRESSION: No evidence of pulmonary embolism. Scattered atherosclerotic disease. Minimal pericardial fluid. Bronchitic and minimal  emphysematous changes. Small focus of ground-glass opacity in RIGHT upper lobe 10 mm diameter ; initial follow-up by chest CT without contrast is recommended in 3 months to confirm persistence, if clinically indicated. This recommendation follows the consensus statement: Recommendations for the Management of Subsolid Pulmonary Nodules Detected at CT: A Statement from the [place] published in Radiology 2013; 266:304-317. Electronically Signed   By: MLavonia DanaM.D.   On: 05/15/2015 19:55     STUDIES:  1/05 CT chest >> small area of GGO Rt upper lobe, mild emphysema 1/06 MRI brain >> chronic small vessel ischemic disease, moderate atrophy 1/06 Echo   CULTURES: 1/05 Respiratory viral panel  ANTIBIOTICS:  SIGNIFICANT EVENTS: 1/01 - 1/03 Admit N/V/D, UTI, hyponatremia 1/05 Re-admit 1/06 Nephrology consulted  LINES/TUBES:  DISCUSSION: 82yo female former smoker with incidental finding of small area of ground glass opacification in Rt upper lobe in setting of hyponatremia.  Concern could be that this lesion is malignant, but this is uncertain and could certainly be a benign lesion.  ASSESSMENT / PLAN:  Right upper lung small area of ground glass opacification. Plan: - monitor clinically - defer bronchoscopy or other lung bx attempts for now - consider f/u CT chest in 4 to 6 weeks depending on remainder of her clinical status  Hyponatremia. Plan: - per primary team  Hx of tobacco abuse, wheezing, and emphysema changes on CT chest. Plan: - solumedrol, BDs per primary team   PCCM will f/u on Monday, 05/19/15 >> call if help needed sooner.   VChesley Mires MD LMad[facility] CenterPulmonary/Critical Care 05/16/2015, 4:37 PM Pager:  [phone] 3pm call: [phone]

## 2015-05-16 NOTE — Progress Notes (Signed)
CRITICAL VALUE ALERT  Critical value received:  NA 117  Date of notification:  05/16/2015   Time of notification:  0832  Critical value read back:Yes.    Nurse who received alert:  Danton Sewer  MD notified (1st page):  Yes   Time of first page:  [phone]  MD notified (2nd page):  Time of second page:  Responding MD:  Dr. Sanjuana Letters  Time MD responded:  [phone]

## 2015-05-16 NOTE — Progress Notes (Signed)
Pt from [place] and plan to return.

## 2015-05-17 DIAGNOSIS — J1100 Influenza due to unidentified influenza virus with unspecified type of pneumonia: Secondary | ICD-10-CM | POA: Diagnosis present

## 2015-05-17 LAB — URINALYSIS, ROUTINE W REFLEX MICROSCOPIC
Bilirubin Urine: NEGATIVE
GLUCOSE, UA: NEGATIVE mg/dL
HGB URINE DIPSTICK: NEGATIVE
Ketones, ur: NEGATIVE mg/dL
Leukocytes, UA: NEGATIVE
Nitrite: NEGATIVE
PH: 7 (ref 5.0–8.0)
PROTEIN: NEGATIVE mg/dL
Specific Gravity, Urine: 1.027 (ref 1.005–1.030)

## 2015-05-17 LAB — CBC WITH DIFFERENTIAL/PLATELET
BASOS ABS: 0 10*3/uL (ref 0.0–0.1)
Basophils Relative: 0 %
EOS PCT: 0 %
Eosinophils Absolute: 0 10*3/uL (ref 0.0–0.7)
HCT: 35.2 % — ABNORMAL LOW (ref 36.0–46.0)
Hemoglobin: 13 g/dL (ref 12.0–15.0)
LYMPHS PCT: 28 %
Lymphs Abs: 1.6 10*3/uL (ref 0.7–4.0)
MCH: 28.5 pg (ref 26.0–34.0)
MCHC: 36.9 g/dL — AB (ref 30.0–36.0)
MCV: 77.2 fL — AB (ref 78.0–100.0)
MONO ABS: 0.7 10*3/uL (ref 0.1–1.0)
Monocytes Relative: 12 %
Neutro Abs: 3.4 10*3/uL (ref 1.7–7.7)
Neutrophils Relative %: 59 %
PLATELETS: 207 10*3/uL (ref 150–400)
RBC: 4.56 MIL/uL (ref 3.87–5.11)
RDW: 12 % (ref 11.5–15.5)
WBC: 5.7 10*3/uL (ref 4.0–10.5)

## 2015-05-17 LAB — C DIFFICILE QUICK SCREEN W PCR REFLEX
C DIFFICILE (CDIFF) INTERP: NEGATIVE
C DIFFICILE (CDIFF) TOXIN: NEGATIVE
C Diff antigen: NEGATIVE

## 2015-05-17 LAB — COMPREHENSIVE METABOLIC PANEL
ALK PHOS: 62 U/L (ref 38–126)
ALT: 18 U/L (ref 14–54)
AST: 24 U/L (ref 15–41)
Albumin: 3.4 g/dL — ABNORMAL LOW (ref 3.5–5.0)
Anion gap: 10 (ref 5–15)
BILIRUBIN TOTAL: 1.1 mg/dL (ref 0.3–1.2)
BUN: 9 mg/dL (ref 6–20)
CALCIUM: 8.5 mg/dL — AB (ref 8.9–10.3)
CO2: 25 mmol/L (ref 22–32)
CREATININE: 0.61 mg/dL (ref 0.44–1.00)
Chloride: 81 mmol/L — ABNORMAL LOW (ref 101–111)
GFR calc Af Amer: >60 mL/min (ref >60)
GLUCOSE: 112 mg/dL — AB (ref 65–99)
POTASSIUM: 3 mmol/L — AB (ref 3.5–5.1)
Sodium: 116 mmol/L — CL (ref 135–145)
TOTAL PROTEIN: 6.1 g/dL — AB (ref 6.5–8.1)

## 2015-05-17 LAB — RESPIRATORY VIRUS PANEL
Adenovirus: NEGATIVE
Influenza A: POSITIVE — AB
Influenza B: NEGATIVE
Metapneumovirus: NEGATIVE
PARAINFLUENZA 2 A: NEGATIVE
PARAINFLUENZA 3 A: NEGATIVE
Parainfluenza 1: NEGATIVE
RHINOVIRUS: NEGATIVE
Respiratory Syncytial Virus A: NEGATIVE
Respiratory Syncytial Virus B: NEGATIVE

## 2015-05-17 LAB — PHOSPHORUS: PHOSPHORUS: 1.9 mg/dL — AB (ref 2.5–4.6)

## 2015-05-17 LAB — MAGNESIUM: Magnesium: 1.7 mg/dL (ref 1.7–2.4)

## 2015-05-17 LAB — CEA: CEA: 1.7 ng/mL (ref 0.0–4.7)

## 2015-05-17 MED ORDER — OSELTAMIVIR PHOSPHATE 30 MG PO CAPS
30.0000 mg | ORAL_CAPSULE | Freq: Two times a day (BID) | ORAL | Status: AC
  Administered 2015-05-17 – 2015-05-21 (×10): 30 mg via ORAL
  Filled 2015-05-17 (×10): qty 1

## 2015-05-17 MED ORDER — POTASSIUM CHLORIDE 20 MEQ/15ML (10%) PO SOLN
40.0000 meq | ORAL | Status: AC
  Administered 2015-05-17 (×3): 40 meq via ORAL
  Filled 2015-05-17 (×3): qty 30

## 2015-05-17 MED ORDER — POTASSIUM & SODIUM PHOSPHATES 280-160-250 MG PO PACK
1.0000 | PACK | Freq: Three times a day (TID) | ORAL | Status: AC
Start: 2015-05-17 — End: 2015-05-17
  Administered 2015-05-17 (×3): 1 via ORAL
  Filled 2015-05-17 (×3): qty 1

## 2015-05-17 MED ORDER — MAGNESIUM SULFATE 2 GM/50ML IV SOLN
2.0000 g | Freq: Once | INTRAVENOUS | Status: AC
  Administered 2015-05-17: 2 g via INTRAVENOUS
  Filled 2015-05-17: qty 50

## 2015-05-17 MED ORDER — PREDNISONE 5 MG PO TABS
30.0000 mg | ORAL_TABLET | Freq: Every day | ORAL | Status: DC
  Administered 2015-05-18 – 2015-05-19 (×2): 30 mg via ORAL
  Filled 2015-05-17 (×2): qty 2

## 2015-05-17 NOTE — Progress Notes (Signed)
Daughter stated that patient ate a whole container of french fries. Will continue to monitor patients diet and intake and output closely. Setzer, Marchelle Folks

## 2015-05-17 NOTE — Progress Notes (Signed)
S: No change from yest except she is drinking some ensure O:BP 149/74 mmHg  Pulse 85  Temp(Src) 98.1 F (36.7 C) (Oral)  Resp 17  Ht '5\' 5"'$  (1.651 m)  Wt 61.4 kg (135 lb 5.8 oz)  BMI 22.53 kg/m2  SpO2 92%  Intake/Output Summary (Last 24 hours) at 05/17/15 1136 Last data filed at 05/17/15 0547  Gross per 24 hour  Intake    360 ml  Output    350 ml  Net     10 ml   Weight change:  DUK:GURKY and alert CVS: RRR Resp: rare wheeze anteriorly Abd:+BS NTND  Ext: no edema NEURO:CNI Ox3 No asterixis   . amLODipine  10 mg Oral Daily  . aspirin  81 mg Oral Daily  . calcium-vitamin D  1 tablet Oral Daily  . cholecalciferol  1,000 Units Oral Daily  . dextromethorphan-guaiFENesin  1 tablet Oral BID  . doxycycline  100 mg Oral Q12H  . feeding supplement (ENSURE ENLIVE)  237 mL Oral BID BM  . heparin  5,000 Units Subcutaneous 3 times per day  . ipratropium-albuterol  3 mL Nebulization BID  . magnesium sulfate 1 - 4 g bolus IVPB  2 g Intravenous Once  . methylPREDNISolone (SOLU-MEDROL) injection  60 mg Intravenous Q24H  . metoprolol  100 mg Oral Daily  . multivitamin with minerals  1 tablet Oral Daily  . omega-3 acid ethyl esters  2 g Oral Daily  . pantoprazole  40 mg Oral Daily  . potassium & sodium phosphates  1 packet Oral TID WC & HS  . potassium chloride  40 mEq Oral Q4H  . rOPINIRole  0.25 mg Oral QHS  . sodium chloride  3 mL Intravenous Q12H  . vitamin C  500 mg Oral Daily   Dg Chest 2 View  05/15/2015  CLINICAL DATA:  Increasing weakness, confusion and anorexia for 5 days, diarrhea, and productive cough for 1 day, recent admission for dehydration, hyponatremia, and UTI, history hypertension, former smoker EXAM: CHEST  2 VIEW COMPARISON:  05/11/2015 FINDINGS: Upper normal heart size. Calcified tortuous aorta. Mediastinal contours and pulmonary vascularity normal. Lungs mildly hyperinflated but clear. No pleural effusion or pneumothorax. Bones demineralized. IMPRESSION: Mild  hyperinflation without acute infiltrate. Electronically Signed   By: Lavonia Dana M.D.   On: 05/15/2015 18:00   Ct Angio Chest Pe W/cm &/or Wo Cm  05/15/2015  CLINICAL DATA:  Increasing weakness, confusion and anorexia for 5 days, diarrhea and productive cough beginning today, recent dehydration, hyponatremia and UTI, hypertension, question pulmonary embolism EXAM: CT ANGIOGRAPHY CHEST WITH CONTRAST TECHNIQUE: Multidetector CT imaging of the chest was performed using the standard protocol during bolus administration of intravenous contrast. Multiplanar CT image reconstructions and MIPs were obtained to evaluate the vascular anatomy. CONTRAST:  157m OMNIPAQUE IOHEXOL 350 MG/ML SOLN IV COMPARISON:  None. FINDINGS: Scattered atherosclerotic calcifications aorta, coronary arteries and proximal great vessels. Aorta normal caliber without aneurysm or dissection. Minimal pericardial effusion. Pulmonary arteries well opacified and patent. No evidence of pulmonary embolism. No thoracic adenopathy. Slight thickening in the adrenal glands without discrete mass. Remaining visualized upper abdomen unremarkable. Bones demineralized with scattered degenerative disc disease changes of thoracic spine. Peripheral interstitial thickening question fibrosis in RIGHT lower lobe. Central peribronchial thickening and minimal upper lobe emphysematous changes. Small focus of ground-glass opacity in the RIGHT upper lobe 10 mm diameter image 16. 2 mm RIGHT upper lobe subpleural nodule image 27. Remaining lungs clear. No pleural effusion or pneumothorax. Review of  the MIP images confirms the above findings. IMPRESSION: No evidence of pulmonary embolism. Scattered atherosclerotic disease. Minimal pericardial fluid. Bronchitic and minimal emphysematous changes. Small focus of ground-glass opacity in RIGHT upper lobe 10 mm diameter ; initial follow-up by chest CT without contrast is recommended in 3 months to confirm persistence, if clinically  indicated. This recommendation follows the consensus statement: Recommendations for the Management of Subsolid Pulmonary Nodules Detected at CT: A Statement from the [place] as published in Radiology 2013; 266:304-317. Electronically Signed   By: Lavonia Dana M.D.   On: 05/15/2015 19:55   Mr Jeri Cos NF Contrast  05/16/2015  CLINICAL DATA:  Hyponatremia. Increasing confusion and weakness. Anorexia. Diarrhea and productive cough. EXAM: MRI HEAD WITHOUT AND WITH CONTRAST TECHNIQUE: Multiplanar, multiecho pulse sequences of the brain and surrounding structures were obtained without and with intravenous contrast. CONTRAST:  70m MULTIHANCE GADOBENATE DIMEGLUMINE 529 MG/ML IV SOLN COMPARISON:  None. FINDINGS: There is no evidence of acute infarct, intracranial hemorrhage, mass, midline shift, or extra-axial fluid collection. There is moderate cerebral atrophy. Small foci of T2 hyperintensity scattered throughout the cerebral white matter bilaterally are nonspecific but compatible with mild chronic small vessel ischemic disease. Minimal T2 hyperintensity in the pons likely reflects chronic small vessel ischemia, without evidence of osmotic demyelination. No abnormal enhancement is identified. Prior bilateral cataract extraction is noted. No significant inflammatory disease is seen in the paranasal sinuses or mastoid air cells. The Major intracranial vascular flow voids are preserved. IMPRESSION: 1. No acute intracranial abnormality. 2. Mild chronic small vessel ischemic disease and moderate cerebral atrophy. Electronically Signed   By: ALogan BoresM.D.   On: 05/16/2015 15:27   Ct Abdomen Pelvis W Contrast  05/16/2015  CLINICAL DATA:  Hospitalized January 1st through May 13, 2015 for urinary tract infection and viral gastritis, persistent hyponatremia EXAM: CT ABDOMEN AND PELVIS WITH CONTRAST TECHNIQUE: Multidetector CT imaging of the abdomen and pelvis was performed using the standard protocol following  bolus administration of intravenous contrast. CONTRAST:  1027mOMNIPAQUE IOHEXOL 300 MG/ML  SOLN COMPARISON:  None. FINDINGS: Lower chest:  No acute findings Hepatobiliary: Status post cholecystectomy. Mild intrahepatic biliary dilatation and mild to moderate extrahepatic biliary dilatation likely related to prior cholecystectomy. Pancreas: Negative Spleen: Negative Adrenals/Urinary Tract: 19 mm right adrenal nodule. 13 mm left adrenal nodule. Calcification of inferior aspect of left adrenal gland. Sub cm low-attenuation round and oval lesions involving both kidneys too small to characterize, most consistent with cysts. Minimal perinephric fluid/ inflammation on the left possibly related to pyelonephritis as stated in clinical indication. No hydronephrosis. Bladder contains relatively hyper attenuating fluid, likely residual excreted contrast from May 15, 2015 CT thorax examination. Stomach/Bowel: Stomach appear normal. Small bowel appears normal. There is moderate diverticulosis of the sigmoid colon. There is trace fluid in the inferior pelvis in the region of the sigmoid colon but there is no evidence of inflammatory change. There is mild diverticulosis of the descending colon as well. Vascular/Lymphatic: Moderate atherosclerotic aortoiliac calcification Reproductive: Reproductive organs not identified presumably surgically absent. Other: As stated above, trace free fluid in the dependent inferior pelvis. Musculoskeletal: No acute musculoskeletal findings IMPRESSION: No specific acute findings. Minimal perinephric fluid or inflammation on the left could reflect recent pyelonephritis. Mild biliary dilatation likely related to status post cholecystectomy. Sigmoid diverticulosis without specific evidence of diverticulitis. Trace free fluid in the inferior pelvis is nonspecific. Electronically Signed   By: RaSkipper Cliche.D.   On: 05/16/2015 18:45   BMET  Component Value Date/Time   NA 116* 05/17/2015 0612    K 3.0* 05/17/2015 0612   CL 81* 05/17/2015 0612   CO2 25 05/17/2015 0612   GLUCOSE 112* 05/17/2015 0612   BUN 9 05/17/2015 0612   CREATININE 0.61 05/17/2015 0612   CALCIUM 8.5* 05/17/2015 0612   GFRNONAA >60 05/17/2015 0612   GFRAA >60 05/17/2015 0612   CBC    Component Value Date/Time   WBC 5.7 05/17/2015 0612   RBC 4.56 05/17/2015 0612   HGB 13.0 05/17/2015 0612   HCT 35.2* 05/17/2015 0612   PLT 207 05/17/2015 0612   MCV 77.2* 05/17/2015 0612   MCH 28.5 05/17/2015 0612   MCHC 36.9* 05/17/2015 0612   RDW 12.0 05/17/2015 0612   LYMPHSABS 1.6 05/17/2015 0612   MONOABS 0.7 05/17/2015 0612   EOSABS 0.0 05/17/2015 0612   BASOSABS 0.0 05/17/2015 0612     Assessment:  1. Hyponatremia that I suspect is related underlying reset osmostat compounded by poor po intake making it difficult to excrete free water +/- SIADH 2. Hypokalemia on replacement 3. Hypophosphatemia on replacement Plan: 1. Cont fluid restriction and will ask nurse to put appropriate signs in the room 2. Cont to follow SNa and if not improving significantly in AM then will consider dose of lasix 3. Daughter plans to bring in food that pt prefers more to see if will help   Emmalie Haigh T

## 2015-05-17 NOTE — Progress Notes (Signed)
[facility] PROGRESS NOTE  Cassidy Ellison JEH:[id number] DOB: [birth year] DOA: 05/15/2015 PCP: Antony Blackbird, MD  Summary 05/16/15: Pleasant 82 year old female with recurrent hyponatremia raising possibility of SIADH. Also has abnormal CT chest-?infection/?neoplasticprocess.Will obtain brain mri/2D echo/ct abdomen/pelvis, consult pulmonary/nephrology, give Doxycycline for now, and restrict fluid intake. Give regular diet. Will defer hyponatremia management to nephrology. Discussed care pan with daughter at bedside. 05/17/15: Appreciate nephrology/pulmonary. Patient has influenza type A. This may be responsible for the poor appetite. Brain MRI 2-D echo/CT abdomen and pelvis unrevealing. Patient had diarrhea this afternoon. May be related to antibiotics. We'll therefore discontinue doxycycline, start Tamiflu and continue recommendations from nephrology/pulmonary. Patient says she feels about the same. I discussed care plan with her daughter at the bedside. Plan Hyponatremia/Acute bronchitis with bronchospasm/Acute encephalopathy/Weakness/Cough/influenza type A/diarrhea  Discontinue doxycycline  Tamiflu  Stool studies  Fluid restriction  Follow pulmonary/Nephrology recommendations Essential hypertension/GERD (gastroesophageal reflux disease)/RLS (restless legs syndrome)  Continue current management  Agree with holding Losartan in view of poor oral intake Code Status: Full Code Family Communication: Daughter at bedside Disposition Plan: ?Home eventually   Consultants:  Nephrology  Pulmonary  Procedures:    Antibiotics:  Doxycycline 05/16/2015> 05/17/2015  Tamiflu 05/17/2015>  HPI/Subjective: Feels about the same  Objective: Filed Vitals:   05/17/15 0841 05/17/15 1330  BP:  106/59  Pulse: 85 73  Temp:  98.2 F (36.8 C)  Resp: 17 16    Intake/Output Summary (Last 24 hours) at 05/17/15 1617 Last data filed at 05/17/15 1400  Gross per 24 hour  Intake    410 ml   Output    450 ml  Net    -40 ml   Filed Weights   05/15/15 2035  Weight: 61.4 kg (135 lb 5.8 oz)    Exam:   General:  Comfortable at rest.  Cardiovascular: S1-S2 normal. No murmurs. Pulse regular.  Respiratory: Good air entry bilaterally. No rhonchi or rales.  Abdomen: Soft and nontender. Normal bowel sounds. No organomegaly.  Musculoskeletal: No pedal edema   Neurological: Intact  Data Reviewed: Basic Metabolic Panel:  Recent Labs Lab 05/11/15 1532  05/16/15 0403 05/16/15 0745 05/16/15 1205 05/16/15 1614 05/17/15 0612  NA 119*  < > 117* 117* 117* 112* 116*  K 3.5  < > 3.2* 3.2* 3.3* 3.4* 3.0*  CL 87*  < > 84* 85* 85* 82* 81*  CO2 22  < > '23 23 22 '$ 21* 25  GLUCOSE 129*  < > 139* 147* 165* 128* 112*  BUN 10  < > '11 10 11 10 9  '$ CREATININE 0.64  < > 0.64 0.61 0.81 0.58 0.61  CALCIUM 8.6*  < > 8.4* 8.3* 8.8* 8.4* 8.5*  MG 1.4*  --  1.7  --   --   --  1.7  PHOS 2.4*  --  2.8  --   --   --  1.9*  < > = values in this interval not displayed. Liver Function Tests:  Recent Labs Lab 05/11/15 1532 05/12/15 0512 05/15/15 1815 05/17/15 0612  AST '30 30 27 24  '$ ALT '18 15 19 18  '$ ALKPHOS 65 55 68 62  BILITOT 0.6 0.4 1.1 1.1  PROT 6.8 6.2* 6.8 6.1*  ALBUMIN 3.7 3.2* 3.5 3.4*   No results for input(s): LIPASE, AMYLASE in the last 168 hours. No results for input(s): AMMONIA in the last 168 hours. CBC:  Recent Labs Lab 05/11/15 1532 05/12/15 0512 05/13/15 0511 05/15/15 1815 05/16/15 0403 05/17/15 0612  WBC  4.5 1.7* 5.1 5.5 3.4* 5.7  NEUTROABS 2.6 1.1* 3.2 4.0  --  3.4  HGB 13.2 11.8* 13.0 13.7 13.0 13.0  HCT 37.3 33.8* 38.7 37.7 35.9* 35.2*  MCV 80.4 79.9 83.8 78.5 78.6 77.2*  PLT 120* 122* 153 170 173 207   Cardiac Enzymes:  Recent Labs Lab 05/15/15 1815  TROPONINI <0.03   BNP (last 3 results) No results for input(s): BNP in the last 8760 hours.  ProBNP (last 3 results) No results for input(s): PROBNP in the last 8760 hours.  CBG: No results  for input(s): GLUCAP in the last 168 hours.  Recent Results (from the past 240 hour(s))  Respiratory virus panel     Status: Abnormal   Collection Time: 05/15/15  9:59 PM  Result Value Ref Range Status   Respiratory Syncytial Virus A Negative Negative Final   Respiratory Syncytial Virus B Negative Negative Final   Influenza A Positive (A) Negative Final    Comment: Subtype: H3   Influenza B Negative Negative Final   Parainfluenza 1 Negative Negative Final   Parainfluenza 2 Negative Negative Final   Parainfluenza 3 Negative Negative Final   Metapneumovirus Negative Negative Final   Rhinovirus Negative Negative Final   Adenovirus Negative Negative Final    Comment: (NOTE) Performed At: [facility] [place], [place] [id number] Lindon Romp MD NF:[id number]      Studies: Dg Chest 2 View  05/15/2015  CLINICAL DATA:  Increasing weakness, confusion and anorexia for 5 days, diarrhea, and productive cough for 1 day, recent admission for dehydration, hyponatremia, and UTI, history hypertension, former smoker EXAM: CHEST  2 VIEW COMPARISON:  05/11/2015 FINDINGS: Upper normal heart size. Calcified tortuous aorta. Mediastinal contours and pulmonary vascularity normal. Lungs mildly hyperinflated but clear. No pleural effusion or pneumothorax. Bones demineralized. IMPRESSION: Mild hyperinflation without acute infiltrate. Electronically Signed   By: Lavonia Dana M.D.   On: 05/15/2015 18:00   Ct Angio Chest Pe W/cm &/or Wo Cm  05/15/2015  CLINICAL DATA:  Increasing weakness, confusion and anorexia for 5 days, diarrhea and productive cough beginning today, recent dehydration, hyponatremia and UTI, hypertension, question pulmonary embolism EXAM: CT ANGIOGRAPHY CHEST WITH CONTRAST TECHNIQUE: Multidetector CT imaging of the chest was performed using the standard protocol during bolus administration of intravenous contrast. Multiplanar CT image reconstructions and MIPs were obtained to  evaluate the vascular anatomy. CONTRAST:  190m OMNIPAQUE IOHEXOL 350 MG/ML SOLN IV COMPARISON:  None. FINDINGS: Scattered atherosclerotic calcifications aorta, coronary arteries and proximal great vessels. Aorta normal caliber without aneurysm or dissection. Minimal pericardial effusion. Pulmonary arteries well opacified and patent. No evidence of pulmonary embolism. No thoracic adenopathy. Slight thickening in the adrenal glands without discrete mass. Remaining visualized upper abdomen unremarkable. Bones demineralized with scattered degenerative disc disease changes of thoracic spine. Peripheral interstitial thickening question fibrosis in RIGHT lower lobe. Central peribronchial thickening and minimal upper lobe emphysematous changes. Small focus of ground-glass opacity in the RIGHT upper lobe 10 mm diameter image 16. 2 mm RIGHT upper lobe subpleural nodule image 27. Remaining lungs clear. No pleural effusion or pneumothorax. Review of the MIP images confirms the above findings. IMPRESSION: No evidence of pulmonary embolism. Scattered atherosclerotic disease. Minimal pericardial fluid. Bronchitic and minimal emphysematous changes. Small focus of ground-glass opacity in RIGHT upper lobe 10 mm diameter ; initial follow-up by chest CT without contrast is recommended in 3 months to confirm persistence, if clinically indicated. This recommendation follows the consensus statement: Recommendations for the Management of  Subsolid Pulmonary Nodules Detected at CT: A Statement from the [place] as published in Radiology 2013; 266:304-317. Electronically Signed   By: Lavonia Dana M.D.   On: 05/15/2015 19:55   Mr Jeri Cos YH Contrast  05/16/2015  CLINICAL DATA:  Hyponatremia. Increasing confusion and weakness. Anorexia. Diarrhea and productive cough. EXAM: MRI HEAD WITHOUT AND WITH CONTRAST TECHNIQUE: Multiplanar, multiecho pulse sequences of the brain and surrounding structures were obtained without and with  intravenous contrast. CONTRAST:  36m MULTIHANCE GADOBENATE DIMEGLUMINE 529 MG/ML IV SOLN COMPARISON:  None. FINDINGS: There is no evidence of acute infarct, intracranial hemorrhage, mass, midline shift, or extra-axial fluid collection. There is moderate cerebral atrophy. Small foci of T2 hyperintensity scattered throughout the cerebral white matter bilaterally are nonspecific but compatible with mild chronic small vessel ischemic disease. Minimal T2 hyperintensity in the pons likely reflects chronic small vessel ischemia, without evidence of osmotic demyelination. No abnormal enhancement is identified. Prior bilateral cataract extraction is noted. No significant inflammatory disease is seen in the paranasal sinuses or mastoid air cells. The Major intracranial vascular flow voids are preserved. IMPRESSION: 1. No acute intracranial abnormality. 2. Mild chronic small vessel ischemic disease and moderate cerebral atrophy. Electronically Signed   By: ALogan BoresM.D.   On: 05/16/2015 15:27   Ct Abdomen Pelvis W Contrast  05/16/2015  CLINICAL DATA:  Hospitalized January 1st through May 13, 2015 for urinary tract infection and viral gastritis, persistent hyponatremia EXAM: CT ABDOMEN AND PELVIS WITH CONTRAST TECHNIQUE: Multidetector CT imaging of the abdomen and pelvis was performed using the standard protocol following bolus administration of intravenous contrast. CONTRAST:  1026mOMNIPAQUE IOHEXOL 300 MG/ML  SOLN COMPARISON:  None. FINDINGS: Lower chest:  No acute findings Hepatobiliary: Status post cholecystectomy. Mild intrahepatic biliary dilatation and mild to moderate extrahepatic biliary dilatation likely related to prior cholecystectomy. Pancreas: Negative Spleen: Negative Adrenals/Urinary Tract: 19 mm right adrenal nodule. 13 mm left adrenal nodule. Calcification of inferior aspect of left adrenal gland. Sub cm low-attenuation round and oval lesions involving both kidneys too small to characterize, most  consistent with cysts. Minimal perinephric fluid/ inflammation on the left possibly related to pyelonephritis as stated in clinical indication. No hydronephrosis. Bladder contains relatively hyper attenuating fluid, likely residual excreted contrast from May 15, 2015 CT thorax examination. Stomach/Bowel: Stomach appear normal. Small bowel appears normal. There is moderate diverticulosis of the sigmoid colon. There is trace fluid in the inferior pelvis in the region of the sigmoid colon but there is no evidence of inflammatory change. There is mild diverticulosis of the descending colon as well. Vascular/Lymphatic: Moderate atherosclerotic aortoiliac calcification Reproductive: Reproductive organs not identified presumably surgically absent. Other: As stated above, trace free fluid in the dependent inferior pelvis. Musculoskeletal: No acute musculoskeletal findings IMPRESSION: No specific acute findings. Minimal perinephric fluid or inflammation on the left could reflect recent pyelonephritis. Mild biliary dilatation likely related to status post cholecystectomy. Sigmoid diverticulosis without specific evidence of diverticulitis. Trace free fluid in the inferior pelvis is nonspecific. Electronically Signed   By: RaSkipper Cliche.D.   On: 05/16/2015 18:45    Scheduled Meds: . amLODipine  10 mg Oral Daily  . aspirin  81 mg Oral Daily  . calcium-vitamin D  1 tablet Oral Daily  . cholecalciferol  1,000 Units Oral Daily  . dextromethorphan-guaiFENesin  1 tablet Oral BID  . feeding supplement (ENSURE ENLIVE)  237 mL Oral BID BM  . heparin  5,000 Units Subcutaneous 3 times per day  . ipratropium-albuterol  3 mL Nebulization BID  . metoprolol  100 mg Oral Daily  . multivitamin with minerals  1 tablet Oral Daily  . omega-3 acid ethyl esters  2 g Oral Daily  . oseltamivir  30 mg Oral BID  . pantoprazole  40 mg Oral Daily  . potassium & sodium phosphates  1 packet Oral TID WC & HS  . potassium chloride  40  mEq Oral Q4H  . [START ON 05/18/2015] predniSONE  30 mg Oral Q breakfast  . rOPINIRole  0.25 mg Oral QHS  . sodium chloride  3 mL Intravenous Q12H  . vitamin C  500 mg Oral Daily   Continuous Infusions:    Time spent: 20 minutes    Annalysia Willenbring  [facility] Pager [phone]. If 7PM-7AM, please contact night-coverage at [email or web address], password [facility] 05/17/2015, 4:17 PM  LOS: 2 days

## 2015-05-18 LAB — CBC
HCT: 35.7 % — ABNORMAL LOW (ref 36.0–46.0)
HEMOGLOBIN: 12.7 g/dL (ref 12.0–15.0)
MCH: 28.3 pg (ref 26.0–34.0)
MCHC: 35.6 g/dL (ref 30.0–36.0)
MCV: 79.7 fL (ref 78.0–100.0)
Platelets: 225 10*3/uL (ref 150–400)
RBC: 4.48 MIL/uL (ref 3.87–5.11)
RDW: 12.3 % (ref 11.5–15.5)
WBC: 5.5 10*3/uL (ref 4.0–10.5)

## 2015-05-18 LAB — BASIC METABOLIC PANEL
Anion gap: 8 (ref 5–15)
BUN: 15 mg/dL (ref 6–20)
CHLORIDE: 88 mmol/L — AB (ref 101–111)
CO2: 24 mmol/L (ref 22–32)
CREATININE: 0.71 mg/dL (ref 0.44–1.00)
Calcium: 8.7 mg/dL — ABNORMAL LOW (ref 8.9–10.3)
GFR calc Af Amer: >60 mL/min (ref >60)
GFR calc non Af Amer: >60 mL/min (ref >60)
GLUCOSE: 98 mg/dL (ref 65–99)
Potassium: 4.8 mmol/L (ref 3.5–5.1)
SODIUM: 120 mmol/L — AB (ref 135–145)

## 2015-05-18 LAB — MAGNESIUM: MAGNESIUM: 2.1 mg/dL (ref 1.7–2.4)

## 2015-05-18 LAB — PHOSPHORUS: Phosphorus: 2 mg/dL — ABNORMAL LOW (ref 2.5–4.6)

## 2015-05-18 MED ORDER — POTASSIUM & SODIUM PHOSPHATES 280-160-250 MG PO PACK
1.0000 | PACK | Freq: Three times a day (TID) | ORAL | Status: AC
  Administered 2015-05-18 – 2015-05-19 (×5): 1 via ORAL
  Filled 2015-05-18 (×6): qty 1

## 2015-05-18 MED ORDER — FUROSEMIDE 40 MG PO TABS
40.0000 mg | ORAL_TABLET | Freq: Every day | ORAL | Status: AC
  Administered 2015-05-18: 40 mg via ORAL
  Filled 2015-05-18: qty 1

## 2015-05-18 NOTE — Progress Notes (Signed)
S: Says she is eating better though appetite still poor O:BP 132/59 mmHg  Pulse 82  Temp(Src) 98.1 F (36.7 C) (Oral)  Resp 16  Ht '5\' 5"'$  (1.651 m)  Wt 61.4 kg (135 lb 5.8 oz)  BMI 22.53 kg/m2  SpO2 94%  Intake/Output Summary (Last 24 hours) at 05/18/15 1141 Last data filed at 05/18/15 0502  Gross per 24 hour  Intake   1008 ml  Output    900 ml  Net    108 ml   Weight change:  FAO:ZHYQM and alert CVS: RRR Resp: clear Abd:+BS NTND  Ext: no edema NEURO:CNI Ox3 No asterixis   . amLODipine  10 mg Oral Daily  . aspirin  81 mg Oral Daily  . calcium-vitamin D  1 tablet Oral Daily  . cholecalciferol  1,000 Units Oral Daily  . dextromethorphan-guaiFENesin  1 tablet Oral BID  . feeding supplement (ENSURE ENLIVE)  237 mL Oral BID BM  . heparin  5,000 Units Subcutaneous 3 times per day  . ipratropium-albuterol  3 mL Nebulization BID  . metoprolol  100 mg Oral Daily  . multivitamin with minerals  1 tablet Oral Daily  . omega-3 acid ethyl esters  2 g Oral Daily  . oseltamivir  30 mg Oral BID  . pantoprazole  40 mg Oral Daily  . predniSONE  30 mg Oral Q breakfast  . rOPINIRole  0.25 mg Oral QHS  . sodium chloride  3 mL Intravenous Q12H  . vitamin C  500 mg Oral Daily   Mr Jeri Cos Wo Contrast  05/16/2015  CLINICAL DATA:  Hyponatremia. Increasing confusion and weakness. Anorexia. Diarrhea and productive cough. EXAM: MRI HEAD WITHOUT AND WITH CONTRAST TECHNIQUE: Multiplanar, multiecho pulse sequences of the brain and surrounding structures were obtained without and with intravenous contrast. CONTRAST:  48m MULTIHANCE GADOBENATE DIMEGLUMINE 529 MG/ML IV SOLN COMPARISON:  None. FINDINGS: There is no evidence of acute infarct, intracranial hemorrhage, mass, midline shift, or extra-axial fluid collection. There is moderate cerebral atrophy. Small foci of T2 hyperintensity scattered throughout the cerebral white matter bilaterally are nonspecific but compatible with mild chronic small vessel  ischemic disease. Minimal T2 hyperintensity in the pons likely reflects chronic small vessel ischemia, without evidence of osmotic demyelination. No abnormal enhancement is identified. Prior bilateral cataract extraction is noted. No significant inflammatory disease is seen in the paranasal sinuses or mastoid air cells. The Major intracranial vascular flow voids are preserved. IMPRESSION: 1. No acute intracranial abnormality. 2. Mild chronic small vessel ischemic disease and moderate cerebral atrophy. Electronically Signed   By: ALogan BoresM.D.   On: 05/16/2015 15:27   Ct Abdomen Pelvis W Contrast  05/16/2015  CLINICAL DATA:  Hospitalized January 1st through May 13, 2015 for urinary tract infection and viral gastritis, persistent hyponatremia EXAM: CT ABDOMEN AND PELVIS WITH CONTRAST TECHNIQUE: Multidetector CT imaging of the abdomen and pelvis was performed using the standard protocol following bolus administration of intravenous contrast. CONTRAST:  108mOMNIPAQUE IOHEXOL 300 MG/ML  SOLN COMPARISON:  None. FINDINGS: Lower chest:  No acute findings Hepatobiliary: Status post cholecystectomy. Mild intrahepatic biliary dilatation and mild to moderate extrahepatic biliary dilatation likely related to prior cholecystectomy. Pancreas: Negative Spleen: Negative Adrenals/Urinary Tract: 19 mm right adrenal nodule. 13 mm left adrenal nodule. Calcification of inferior aspect of left adrenal gland. Sub cm low-attenuation round and oval lesions involving both kidneys too small to characterize, most consistent with cysts. Minimal perinephric fluid/ inflammation on the left possibly related to pyelonephritis as  stated in clinical indication. No hydronephrosis. Bladder contains relatively hyper attenuating fluid, likely residual excreted contrast from May 15, 2015 CT thorax examination. Stomach/Bowel: Stomach appear normal. Small bowel appears normal. There is moderate diverticulosis of the sigmoid colon. There is  trace fluid in the inferior pelvis in the region of the sigmoid colon but there is no evidence of inflammatory change. There is mild diverticulosis of the descending colon as well. Vascular/Lymphatic: Moderate atherosclerotic aortoiliac calcification Reproductive: Reproductive organs not identified presumably surgically absent. Other: As stated above, trace free fluid in the dependent inferior pelvis. Musculoskeletal: No acute musculoskeletal findings IMPRESSION: No specific acute findings. Minimal perinephric fluid or inflammation on the left could reflect recent pyelonephritis. Mild biliary dilatation likely related to status post cholecystectomy. Sigmoid diverticulosis without specific evidence of diverticulitis. Trace free fluid in the inferior pelvis is nonspecific. Electronically Signed   By: Skipper Cliche M.D.   On: 05/16/2015 18:45   BMET    Component Value Date/Time   NA 120* 05/18/2015 0548   K 4.8 05/18/2015 0548   CL 88* 05/18/2015 0548   CO2 24 05/18/2015 0548   GLUCOSE 98 05/18/2015 0548   BUN 15 05/18/2015 0548   CREATININE 0.71 05/18/2015 0548   CALCIUM 8.7* 05/18/2015 0548   GFRNONAA >60 05/18/2015 0548   GFRAA >60 05/18/2015 0548   CBC    Component Value Date/Time   WBC 5.5 05/18/2015 0548   RBC 4.48 05/18/2015 0548   HGB 12.7 05/18/2015 0548   HCT 35.7* 05/18/2015 0548   PLT 225 05/18/2015 0548   MCV 79.7 05/18/2015 0548   MCH 28.3 05/18/2015 0548   MCHC 35.6 05/18/2015 0548   RDW 12.3 05/18/2015 0548   LYMPHSABS 1.6 05/17/2015 0612   MONOABS 0.7 05/17/2015 0612   EOSABS 0.0 05/17/2015 0612   BASOSABS 0.0 05/17/2015 0612     Assessment:  1. Hyponatremia that I suspect is related underlying reset osmostat compounded by poor po intake making it difficult to excrete free water +/- SIADH, SNa sl higher 2. Hypokalemia, improved 3. Hypophosphatemia  Plan: 1. Cont fluid restriction  2. Will give dose of lasix today and then prn 3. Cont with PO4  replacement  Deandra Gadson T

## 2015-05-18 NOTE — Progress Notes (Signed)
[facility] PROGRESS NOTE  Cassidy Ellison UMP:[id number] DOB: July 24, 1921 DOA: 05/15/2015 PCP: Antony Blackbird, MD  Summary 05/16/15: Pleasant 82 year old female with recurrent hyponatremia raising possibility of SIADH. Also has abnormal CT chest-?infection/?neoplasticprocess.Will obtain brain mri/2D echo/ct abdomen/pelvis, consult pulmonary/nephrology, give Doxycycline for now, and restrict fluid intake. Give regular diet. Will defer hyponatremia management to nephrology. Discussed care pan with daughter at bedside. 05/17/15: Appreciate nephrology/pulmonary. Patient has influenza type A. This may be responsible for the poor appetite. Brain MRI 2-D echo/CT abdomen and pelvis unrevealing. Patient had diarrhea this afternoon. May be related to antibiotics. We'll therefore discontinue doxycycline, start Tamiflu and continue recommendations from nephrology/pulmonary. Patient says she feels about the same. I discussed care plan with her daughter at the bedside. 05/18/15: [place] neph[facility]. Sodium improved to 120. Patient lethargic. Ate some french fries. Discussed with daughter at bedside. Continue current measures with nephrology direction. If lethargy increasing despite sodium improving, would worry about partially treated infection. In which case, we'll would resume antibiotics. Plan Hyponatremia/Acute bronchitis with bronchospasm/Acute encephalopathy/Weakness/Cough/influenza type A/diarrhea  Continue Tamiflu to complete course  Follow Stool studies  Fluid restriction  Follow pulmonary/Nephrology recommendations  Panculture and start antibiotics if fever or encephalopathy Essential hypertension/GERD (gastroesophageal reflux disease)/RLS (restless legs syndrome)  Continue current management  Agree with holding Losartan in view of poor oral intake Code Status: Full Code Family Communication: Daughter at bedside Disposition Plan: ?Home  eventually   Consultants:  Nephrology  Pulmonary  Procedures:    Antibiotics:  Doxycycline 05/16/2015> 05/17/2015  Tamiflu 05/17/2015>  HPI/Subjective: Feels tired-" malaise".  Objective: Filed Vitals:   05/18/15 0905 05/18/15 1546  BP:  134/77  Pulse: 82 77  Temp:  97.8 F (36.6 C)  Resp: 16 17    Intake/Output Summary (Last 24 hours) at 05/18/15 2031 Last data filed at 05/18/15 1902  Gross per 24 hour  Intake   1380 ml  Output    900 ml  Net    480 ml   Filed Weights   05/15/15 2035  Weight: 61.4 kg (135 lb 5.8 oz)    Exam:   General:  Apathetic.  Cardiovascular: S1-S2 normal. No murmurs. Pulse regular.  Respiratory: Good air entry bilaterally. No rhonchi or rales.  Abdomen: Soft and nontender. Normal bowel sounds. No organomegaly.  Musculoskeletal: No pedal edema   Neurological: Intact  Data Reviewed: Basic Metabolic Panel:  Recent Labs Lab 05/16/15 0403 05/16/15 0745 05/16/15 1205 05/16/15 1614 05/17/15 0612 05/18/15 0548  NA 117* 117* 117* 112* 116* 120*  K 3.2* 3.2* 3.3* 3.4* 3.0* 4.8  CL 84* 85* 85* 82* 81* 88*  CO2 '23 23 22 '$ 21* 25 24  GLUCOSE 139* 147* 165* 128* 112* 98  BUN '11 10 11 10 9 15  '$ CREATININE 0.64 0.61 0.81 0.58 0.61 0.71  CALCIUM 8.4* 8.3* 8.8* 8.4* 8.5* 8.7*  MG 1.7  --   --   --  1.7 2.1  PHOS 2.8  --   --   --  1.9* 2.0*   Liver Function Tests:  Recent Labs Lab 05/12/15 0512 05/15/15 1815 05/17/15 0612  AST '30 27 24  '$ ALT '15 19 18  '$ ALKPHOS 55 68 62  BILITOT 0.4 1.1 1.1  PROT 6.2* 6.8 6.1*  ALBUMIN 3.2* 3.5 3.4*   No results for input(s): LIPASE, AMYLASE in the last 168 hours. No results for input(s): AMMONIA in the last 168 hours. CBC:  Recent Labs Lab 05/12/15 0512 05/13/15 0511 05/15/15 1815 05/16/15 0403 05/17/15 0612 05/18/15  0548  WBC 1.7* 5.1 5.5 3.4* 5.7 5.5  NEUTROABS 1.1* 3.2 4.0  --  3.4  --   HGB 11.8* 13.0 13.7 13.0 13.0 12.7  HCT 33.8* 38.7 37.7 35.9* 35.2* 35.7*  MCV 79.9  83.8 78.5 78.6 77.2* 79.7  PLT 122* 153 170 173 207 225   Cardiac Enzymes:  Recent Labs Lab 05/15/15 1815  TROPONINI <0.03   BNP (last 3 results) No results for input(s): BNP in the last 8760 hours.  ProBNP (last 3 results) No results for input(s): PROBNP in the last 8760 hours.  CBG: No results for input(s): GLUCAP in the last 168 hours.  Recent Results (from the past 240 hour(s))  Respiratory virus panel     Status: Abnormal   Collection Time: 05/15/15  9:59 PM  Result Value Ref Range Status   Respiratory Syncytial Virus A Negative Negative Final   Respiratory Syncytial Virus B Negative Negative Final   Influenza A Positive (A) Negative Final    Comment: Subtype: H3   Influenza B Negative Negative Final   Parainfluenza 1 Negative Negative Final   Parainfluenza 2 Negative Negative Final   Parainfluenza 3 Negative Negative Final   Metapneumovirus Negative Negative Final   Rhinovirus Negative Negative Final   Adenovirus Negative Negative Final    Comment: (NOTE) Performed At: [facility] [place], [place] [id number] Lindon Romp MD IF:[id number]   C difficile quick scan w PCR reflex     Status: None   Collection Time: 05/17/15  2:26 PM  Result Value Ref Range Status   C Diff antigen NEGATIVE NEGATIVE Final   C Diff toxin NEGATIVE NEGATIVE Final   C Diff interpretation Negative for toxigenic C. difficile  Final     Studies: No results found.  Scheduled Meds: . amLODipine  10 mg Oral Daily  . aspirin  81 mg Oral Daily  . calcium-vitamin D  1 tablet Oral Daily  . cholecalciferol  1,000 Units Oral Daily  . dextromethorphan-guaiFENesin  1 tablet Oral BID  . feeding supplement (ENSURE ENLIVE)  237 mL Oral BID BM  . heparin  5,000 Units Subcutaneous 3 times per day  . metoprolol  100 mg Oral Daily  . multivitamin with minerals  1 tablet Oral Daily  . omega-3 acid ethyl esters  2 g Oral Daily  . oseltamivir  30 mg Oral BID  .  pantoprazole  40 mg Oral Daily  . potassium & sodium phosphates  1 packet Oral TID WC & HS  . predniSONE  30 mg Oral Q breakfast  . rOPINIRole  0.25 mg Oral QHS  . sodium chloride  3 mL Intravenous Q12H  . vitamin C  500 mg Oral Daily   Continuous Infusions:    Time spent: 25 minutes    Ahliyah Nienow  [facility] Pager [phone]. If 7PM-7AM, please contact night-coverage at [email or web address], password [facility] 05/18/2015, 8:31 PM  LOS: 3 days

## 2015-05-19 DIAGNOSIS — J209 Acute bronchitis, unspecified: Secondary | ICD-10-CM

## 2015-05-19 LAB — CBC
HEMATOCRIT: 37.1 % (ref 36.0–46.0)
Hemoglobin: 12.9 g/dL (ref 12.0–15.0)
MCH: 27.9 pg (ref 26.0–34.0)
MCHC: 34.8 g/dL (ref 30.0–36.0)
MCV: 80.3 fL (ref 78.0–100.0)
PLATELETS: 239 10*3/uL (ref 150–400)
RBC: 4.62 MIL/uL (ref 3.87–5.11)
RDW: 12.3 % (ref 11.5–15.5)
WBC: 6.9 10*3/uL (ref 4.0–10.5)

## 2015-05-19 LAB — CK: CK TOTAL: 31 U/L — AB (ref 38–234)

## 2015-05-19 LAB — RENAL FUNCTION PANEL
ANION GAP: 6 (ref 5–15)
Albumin: 3.4 g/dL — ABNORMAL LOW (ref 3.5–5.0)
BUN: 17 mg/dL (ref 6–20)
CO2: 27 mmol/L (ref 22–32)
CREATININE: 0.81 mg/dL (ref 0.44–1.00)
Calcium: 8.9 mg/dL (ref 8.9–10.3)
Chloride: 85 mmol/L — ABNORMAL LOW (ref 101–111)
Glucose, Bld: 107 mg/dL — ABNORMAL HIGH (ref 65–99)
Phosphorus: 2.7 mg/dL (ref 2.5–4.6)
Potassium: 3.9 mmol/L (ref 3.5–5.1)
SODIUM: 118 mmol/L — AB (ref 135–145)

## 2015-05-19 LAB — HEPATIC FUNCTION PANEL
ALT: 17 U/L (ref 14–54)
AST: 17 U/L (ref 15–41)
Albumin: 3.4 g/dL — ABNORMAL LOW (ref 3.5–5.0)
Alkaline Phosphatase: 62 U/L (ref 38–126)
BILIRUBIN DIRECT: 0.2 mg/dL (ref 0.1–0.5)
BILIRUBIN INDIRECT: 0.6 mg/dL (ref 0.3–0.9)
BILIRUBIN TOTAL: 0.8 mg/dL (ref 0.3–1.2)
Total Protein: 6.5 g/dL (ref 6.5–8.1)

## 2015-05-19 LAB — CANCER ANTIGEN 19-9: CA 19-9: 13 U/mL (ref 0–35)

## 2015-05-19 LAB — SODIUM: SODIUM: 112 mmol/L — AB (ref 135–145)

## 2015-05-19 MED ORDER — METOCLOPRAMIDE HCL 5 MG/ML IJ SOLN
5.0000 mg | Freq: Once | INTRAMUSCULAR | Status: AC
  Administered 2015-05-19: 5 mg via INTRAVENOUS
  Filled 2015-05-19: qty 2

## 2015-05-19 MED ORDER — TOLVAPTAN 15 MG PO TABS
15.0000 mg | ORAL_TABLET | ORAL | Status: DC
  Administered 2015-05-19: 15 mg via ORAL
  Filled 2015-05-19 (×2): qty 1

## 2015-05-19 MED ORDER — PREDNISONE 20 MG PO TABS
20.0000 mg | ORAL_TABLET | Freq: Every day | ORAL | Status: DC
  Administered 2015-05-20 – 2015-05-21 (×2): 20 mg via ORAL
  Filled 2015-05-19 (×2): qty 1

## 2015-05-19 MED ORDER — DEXTROSE 5 % IV SOLN
1.0000 g | Freq: Two times a day (BID) | INTRAVENOUS | Status: DC
  Administered 2015-05-19 – 2015-05-23 (×8): 1 g via INTRAVENOUS
  Filled 2015-05-19 (×8): qty 1

## 2015-05-19 MED ORDER — VANCOMYCIN HCL IN DEXTROSE 1-5 GM/200ML-% IV SOLN
1000.0000 mg | INTRAVENOUS | Status: DC
  Administered 2015-05-19 – 2015-05-21 (×3): 1000 mg via INTRAVENOUS
  Filled 2015-05-19 (×4): qty 200

## 2015-05-19 MED ORDER — DRONABINOL 2.5 MG PO CAPS
2.5000 mg | ORAL_CAPSULE | Freq: Every day | ORAL | Status: DC
  Administered 2015-05-19 – 2015-05-24 (×6): 2.5 mg via ORAL
  Filled 2015-05-19 (×6): qty 1

## 2015-05-19 NOTE — Progress Notes (Signed)
ANTIBIOTIC CONSULT NOTE - INITIAL  Pharmacy Consult for Vancomycin, Cefepime Indication: r/o HAP  Allergies  Allergen Reactions  . Adhesive [Tape] Itching  . Chlorthalidone Other (See Comments)    creatinine  increases  . Hctz [Hydrochlorothiazide] Other (See Comments)    CREATNINE INCREASES  . Keflex [Cephalexin]   . Oxsoralen [Methoxsalen]     Patient Measurements: Height: '5\' 5"'$  (165.1 cm) Weight: 135 lb 5.8 oz (61.4 kg) IBW/kg (Calculated) : 57  Vital Signs: Temp: 98.2 F (36.8 C) (01/09 1304) Temp Source: Oral (01/09 1304) BP: 124/61 mmHg (01/09 1304) Pulse Rate: 74 (01/09 1304) Intake/Output from previous day: 01/08 0701 - 01/09 0700 In: 1140 [P.O.:1140] Out: 950 [Urine:950] Intake/Output from this shift: Total I/O In: -  Out: 300 [Urine:300]  Labs:  Recent Labs  05/17/15 0612 05/18/15 0548 05/19/15 0513  WBC 5.7 5.5 6.9  HGB 13.0 12.7 12.9  PLT 207 225 239  CREATININE 0.61 0.71 0.81   Estimated Creatinine Clearance: 40.7 mL/min (by C-G formula based on Cr of 0.81). No results for input(s): VANCOTROUGH, VANCOPEAK, VANCORANDOM, GENTTROUGH, GENTPEAK, GENTRANDOM, TOBRATROUGH, TOBRAPEAK, TOBRARND, AMIKACINPEAK, AMIKACINTROU, AMIKACIN in the last 72 hours.   Microbiology: Recent Results (from the past 720 hour(s))  Respiratory virus panel     Status: Abnormal   Collection Time: 05/15/15  9:59 PM  Result Value Ref Range Status   Respiratory Syncytial Virus A Negative Negative Final   Respiratory Syncytial Virus B Negative Negative Final   Influenza A Positive (A) Negative Final    Comment: Subtype: H3   Influenza B Negative Negative Final   Parainfluenza 1 Negative Negative Final   Parainfluenza 2 Negative Negative Final   Parainfluenza 3 Negative Negative Final   Metapneumovirus Negative Negative Final   Rhinovirus Negative Negative Final   Adenovirus Negative Negative Final    Comment: (NOTE) Performed At: [facility] [place], [place] [id number] Lindon Romp MD [id number]   C difficile quick scan w PCR reflex     Status: None   Collection Time: 05/17/15  2:26 PM  Result Value Ref Range Status   C Diff antigen NEGATIVE NEGATIVE Final   C Diff toxin NEGATIVE NEGATIVE Final   C Diff interpretation Negative for toxigenic C. difficile  Final    Medical History: Past Medical History  Diagnosis Date  . Hypertension   . GERD (gastroesophageal reflux disease)   . Carpal tunnel syndrome     left  . DVT (deep venous thrombosis) ([place])   . RLS (restless legs syndrome)     Medications:  Anti-infectives    Start     Dose/Rate Route Frequency Ordered Stop   05/19/15 2000  vancomycin (VANCOCIN) IVPB 1000 mg/200 mL premix     1,000 mg 200 mL/hr over 60 Minutes Intravenous Every 24 hours 05/19/15 1756     05/19/15 1830  ceFEPIme (MAXIPIME) 1 g in dextrose 5 % 50 mL IVPB     1 g 100 mL/hr over 30 Minutes Intravenous Every 12 hours 05/19/15 1756     05/17/15 1400  oseltamivir (TAMIFLU) capsule 30 mg     30 mg Oral 2 times daily 05/17/15 1202 05/22/15 0959   05/16/15 1700  doxycycline (VIBRA-TABS) tablet 100 mg  Status:  Discontinued     100 mg Oral Every 12 hours 05/16/15 1617 05/17/15 1614   05/16/15 1630  piperacillin-tazobactam (ZOSYN) IVPB 3.375 g  Status:  Discontinued     3.375 g 12.5 mL/hr over 240 Minutes Intravenous  Every 8 hours 05/16/15 1609 05/16/15 1625     Assessment: 82 y.o. female w/ PMH of emphysema, HTN, DVT, admitted 05/15/2015 for hyponatremia and weakness.  Found to be positive for influenza; treating with Tamiflu. Small area of ground glass opacification found on CT; not immediately perceived to be infectious. No changes in labs, vitals, or oxygenation status, but d/t increasing lethargy, weakness, and confusion, MD would like pharmacy to dose vancomycin and cefepime for r/o PNA.  1/6 >>doxy po  >> 1/7 1/7 >> Tamiflu >>  (1/12) 1/9 >> vancomycin >> 1/9 >> cefepime >>    1/9  blood: IP C diff: neg Resp virus panel: Influenza A  Afebrile WBC: wnl Renal: SCr slightly higher than baseline; CrCl 41 CG  Goal of Therapy:  Vancomycin trough level 15-20 mcg/ml  Eradication of infection Appropriate antibiotic dosing for indication and renal function  Plan:  Day 1 antibiotics Vancomycin 1000 mg IV q24 hr Measure vancomycin trough levels at steady state as indicated Cefepime 1 g IV q12 hr  Follow clinical course, renal function, culture results as available  Follow for de-escalation of antibiotics and LOT. Would use low threshold to discontinue abx as new symptoms could easily be due to hypernatremia.   Reuel Boom, PharmD, BCPS Pager: [phone] 05/19/2015, 6:08 PM

## 2015-05-19 NOTE — Progress Notes (Signed)
PULMONARY / CRITICAL CARE MEDICINE   Name: Cassidy Ellison MRN: [id number] DOB: [birth year]    ADMISSION DATE:  05/15/2015 CONSULTATION DATE:  05/16/2015  REFERRING MD:  Dr. Sanjuana Letters  CHIEF COMPLAINT:  Weakness  HISTORY OF PRESENT ILLNESS:   82 yo female was in hospital earlier this month with N/V/D and hyponatremia.  She was tx for a UTI.  She was d/c on 05/13/15.  She had f/u with PCP and was advised to get lab and CXR.  The family didn't want to wait, so they came to ER for testing.  She was found to have hyponatremia again, and was re-admitted.  She had CT chest and this showed very small area of ground glass opacification in Rt upper lobe, and mild changes of emphysema.  For this PCCM was consulted.  She denies cough, or sputum.  No hx of hemoptysis.  She is not aware of wheezing, but others have told her that she has been wheezing some recently.  She is not having fever.  Abdominal symptoms have improved.  She denies chest pain, or skin rashes.  She quit smoking years ago.  She is from [place].  She denies hx of pneumonia, tuberculosis, asthma, or COPD.  She denies animal/bird exposure.   SUBJECTIVE:  Daughter Cassidy Ellison at bedside Denies pain or headache C/o gen weakness afebrile  VITAL SIGNS: BP 140/62 mmHg  Pulse 74  Temp(Src) 98.1 F (36.7 C) (Oral)  Resp 18  Ht '5\' 5"'$  (1.651 m)  Wt 61.4 kg (135 lb 5.8 oz)  BMI 22.53 kg/m2  SpO2 96%  INTAKE / OUTPUT: I/O last 3 completed shifts: In: 8185 [P.O.:1740] Out: 6314 [Urine:1650]  PHYSICAL EXAMINATION: General:  pleasant Neuro:  Alert, normal strength, CN intact, follows commands HEENT:  Pupils reactive, no oral exudate, no LAN Cardiovascular:  Regular, no murmur Lungs:  No wheeze/rales Abdomen:  Soft, non tender Musculoskeletal:  No edema Skin:  No rashes  LABS:  BMET  Recent Labs Lab 05/17/15 0612 05/18/15 0548 05/19/15 0513  NA 116* 120* 118*  K 3.0* 4.8 3.9  CL 81* 88* 85*  CO2 '25 24 27  '$ BUN '9 15 17   '$ CREATININE 0.61 0.71 0.81  GLUCOSE 112* 98 107*    Electrolytes  Recent Labs Lab 05/16/15 0403  05/17/15 0612 05/18/15 0548 05/19/15 0513  CALCIUM 8.4*  < > 8.5* 8.7* 8.9  MG 1.7  --  1.7 2.1  --   PHOS 2.8  --  1.9* 2.0* 2.7  < > = values in this interval not displayed.  CBC  Recent Labs Lab 05/17/15 0612 05/18/15 0548 05/19/15 0513  WBC 5.7 5.5 6.9  HGB 13.0 12.7 12.9  HCT 35.2* 35.7* 37.1  PLT 207 225 239    Coag's  Recent Labs Lab 05/15/15 2345  INR 1.11    Sepsis Markers No results for input(s): LATICACIDVEN, PROCALCITON, O2SATVEN in the last 168 hours.  ABG No results for input(s): PHART, PCO2ART, PO2ART in the last 168 hours.  Liver Enzymes  Recent Labs Lab 05/15/15 1815 05/17/15 0612 05/19/15 0513  AST '27 24 17  '$ ALT '19 18 17  '$ ALKPHOS 68 62 62  BILITOT 1.1 1.1 0.8  ALBUMIN 3.5 3.4* 3.4*  3.4*    Cardiac Enzymes  Recent Labs Lab 05/15/15 1815  TROPONINI <0.03    Glucose No results for input(s): GLUCAP in the last 168 hours.  Imaging No results found.   STUDIES:  1/05 CT chest >> small area of GGO Rt  upper lobe, mild emphysema 1/06 MRI brain >> chronic small vessel ischemic disease, moderate atrophy 1/06 Echo >> nml LVEF  CULTURES: 1/05 Respiratory viral panel  ANTIBIOTICS:  SIGNIFICANT EVENTS: 1/01 - 1/03 Admit N/V/D, UTI, hyponatremia 1/05 Re-admit 1/06 Nephrology consulted  LINES/TUBES:  DISCUSSION: 82 yo female former smoker with incidental finding of small area of ground glass opacification in Rt upper lobe in setting of hyponatremia.  Concern could be that this lesion is malignant, but this is uncertain and could certainly be a benign lesion. RVp pos for flu A , strangely no febrile illnes preceding  ASSESSMENT / PLAN:  Right upper lung small area of ground glass opacification. Plan:  - defer bronchoscopy or other lung bx attempts for now - Needs  f/u CT chest in 3-6 months - can be arranged by PCP or  call our office after discharge  Hyponatremia. Plan: - per primary team  Hx of tobacco abuse, wheezing, and emphysema changes on CT chest. Plan: - taper prednisone to off in 1 week  Her main issue is gen weakness which may be related to hyponatremia & influenza  PCCM available as needed     Cassidy Ellison. MD 05/19/2015, 12:47 PM

## 2015-05-19 NOTE — Progress Notes (Signed)
[facility] PROGRESS NOTE  Cassidy Ellison QJF:[id number] DOB: 17-Apr-1922 DOA: 05/15/2015 PCP: Antony Blackbird, MD  Summary 05/16/15: Pleasant 82 year old female with recurrent hyponatremia raising possibility of SIADH. Also has abnormal CT chest-?infection/?neoplasticprocess.Will obtain brain mri/2D echo/ct abdomen/pelvis, consult pulmonary/nephrology, give Doxycycline for now, and restrict fluid intake. Give regular diet. Will defer hyponatremia management to nephrology. Discussed care pan with daughter at bedside. 05/17/15: Appreciate nephrology/pulmonary. Patient has influenza type A. This may be responsible for the poor appetite. Brain MRI 2-D echo/CT abdomen and pelvis unrevealing. Patient had diarrhea this afternoon. May be related to antibiotics. We'll therefore discontinue doxycycline, start Tamiflu and continue recommendations from nephrology/pulmonary. Patient says she feels about the same. I discussed care plan with her daughter at the bedside. 05/18/15: [place] nephrology. Sodium improved to 120. Patient lethargic. Ate some french fries. Discussed with daughter at bedside. Continue current measures with nephrology direction. If lethargy increasing despite sodium improving, would worry about partially treated infection. In which case, we'll would resume antibiotics. 05/19/15: Appreciate pulmonary/nephrology. Patient lethargic. Oral intake remains. Sodium is 118. Not sure why patient not eating-?sequelae of Influenza, ?residual bacterial infection, ?hyponatremia. Stool studies unremarkable. Will give antibiotics for Nosocomial organisms, and try marinol, defer management of hyponatremia to Nephrology. Plan Hyponatremia/Acute bronchitis with bronchospasm/Acute encephalopathy/Weakness/Cough/influenza type A/diarrhea  Continue Tamiflu to complete course  Follow Stool studies  Fluid restriction  Follow pulmonary/Nephrology recommendations  Vanc/Cefepime/Marinol Essential hypertension/GERD  (gastroesophageal reflux disease)/RLS (restless legs syndrome)  Continue current management Code Status: Full Code Family Communication: Daughter at bedside Disposition Plan: ?Home eventually   Consultants:  Nephrology  Pulmonary  Procedures:    Antibiotics:  Doxycycline 05/16/2015> 05/17/2015  Tamiflu 05/17/2015>  Vanc 05/19/15  Cefepime 05/19/15  HPI/Subjective: Feels tired.  Objective: Filed Vitals:   05/19/15 1304 05/19/15 2047  BP: 124/61 135/76  Pulse: 74 84  Temp: 98.2 F (36.8 C) 97.7 F (36.5 C)  Resp: 16 16    Intake/Output Summary (Last 24 hours) at 05/19/15 2328 Last data filed at 05/19/15 1305  Gross per 24 hour  Intake    120 ml  Output    750 ml  Net   -630 ml   Filed Weights   05/15/15 2035  Weight: 61.4 kg (135 lb 5.8 oz)    Exam:   General:  Comfortable at rest.  Cardiovascular: S1-S2 normal. No murmurs. Pulse regular.  Respiratory: Good air entry bilaterally. No rhonchi or rales.  Abdomen: Soft and nontender. Normal bowel sounds. No organomegaly.  Musculoskeletal: No pedal edema   Neurological: Intact  Data Reviewed: Basic Metabolic Panel:  Recent Labs Lab 05/16/15 0403  05/16/15 1205 05/16/15 1614 05/17/15 0612 05/18/15 0548 05/19/15 0513 05/19/15 1855  NA 117*  < > 117* 112* 116* 120* 118* 112*  K 3.2*  < > 3.3* 3.4* 3.0* 4.8 3.9  --   CL 84*  < > 85* 82* 81* 88* 85*  --   CO2 23  < > 22 21* '25 24 27  '$ --   GLUCOSE 139*  < > 165* 128* 112* 98 107*  --   BUN 11  < > '11 10 9 15 17  '$ --   CREATININE 0.64  < > 0.81 0.58 0.61 0.71 0.81  --   CALCIUM 8.4*  < > 8.8* 8.4* 8.5* 8.7* 8.9  --   MG 1.7  --   --   --  1.7 2.1  --   --   PHOS 2.8  --   --   --  1.9* 2.0* 2.7  --   < > = values in this interval not displayed. Liver Function Tests:  Recent Labs Lab 05/15/15 1815 05/17/15 0612 05/19/15 0513  AST '27 24 17  '$ ALT '19 18 17  '$ ALKPHOS 68 62 62  BILITOT 1.1 1.1 0.8  PROT 6.8 6.1* 6.5  ALBUMIN 3.5 3.4* 3.4*   3.4*   No results for input(s): LIPASE, AMYLASE in the last 168 hours. No results for input(s): AMMONIA in the last 168 hours. CBC:  Recent Labs Lab 05/13/15 0511 05/15/15 1815 05/16/15 0403 05/17/15 0612 05/18/15 0548 05/19/15 0513  WBC 5.1 5.5 3.4* 5.7 5.5 6.9  NEUTROABS 3.2 4.0  --  3.4  --   --   HGB 13.0 13.7 13.0 13.0 12.7 12.9  HCT 38.7 37.7 35.9* 35.2* 35.7* 37.1  MCV 83.8 78.5 78.6 77.2* 79.7 80.3  PLT 153 170 173 207 225 239   Cardiac Enzymes:  Recent Labs Lab 05/15/15 1815 05/19/15 1855  CKTOTAL  --  31*  TROPONINI <0.03  --    BNP (last 3 results) No results for input(s): BNP in the last 8760 hours.  ProBNP (last 3 results) No results for input(s): PROBNP in the last 8760 hours.  CBG: No results for input(s): GLUCAP in the last 168 hours.  Recent Results (from the past 240 hour(s))  Respiratory virus panel     Status: Abnormal   Collection Time: 05/15/15  9:59 PM  Result Value Ref Range Status   Respiratory Syncytial Virus A Negative Negative Final   Respiratory Syncytial Virus B Negative Negative Final   Influenza A Positive (A) Negative Final    Comment: Subtype: H3   Influenza B Negative Negative Final   Parainfluenza 1 Negative Negative Final   Parainfluenza 2 Negative Negative Final   Parainfluenza 3 Negative Negative Final   Metapneumovirus Negative Negative Final   Rhinovirus Negative Negative Final   Adenovirus Negative Negative Final    Comment: (NOTE) Performed At: [facility] [place], [place] [id number] Lindon Romp MD [id number]   C difficile quick scan w PCR reflex     Status: None   Collection Time: 05/17/15  2:26 PM  Result Value Ref Range Status   C Diff antigen NEGATIVE NEGATIVE Final   C Diff toxin NEGATIVE NEGATIVE Final   C Diff interpretation Negative for toxigenic C. difficile  Final     Studies: No results found.  Scheduled Meds: . amLODipine  10 mg Oral Daily  . aspirin  81 mg  Oral Daily  . calcium-vitamin D  1 tablet Oral Daily  . ceFEPime (MAXIPIME) IV  1 g Intravenous Q12H  . cholecalciferol  1,000 Units Oral Daily  . dronabinol  2.5 mg Oral Daily  . feeding supplement (ENSURE ENLIVE)  237 mL Oral BID BM  . heparin  5,000 Units Subcutaneous 3 times per day  . metoprolol  100 mg Oral Daily  . multivitamin with minerals  1 tablet Oral Daily  . omega-3 acid ethyl esters  2 g Oral Daily  . oseltamivir  30 mg Oral BID  . pantoprazole  40 mg Oral Daily  . [START ON 05/20/2015] predniSONE  20 mg Oral Q breakfast  . sodium chloride  3 mL Intravenous Q12H  . tolvaptan  15 mg Oral Q24H  . vancomycin  1,000 mg Intravenous Q24H  . vitamin C  500 mg Oral Daily   Continuous Infusions:    Time spent: 25 minutes  Cassidy Ellison  [facility] Pager [phone]. If 7PM-7AM, please contact night-coverage at [email or web address], password [facility] 05/19/2015, 11:28 PM  LOS: 4 days

## 2015-05-19 NOTE — Progress Notes (Addendum)
  [facility] Progress Note   Subjective: serum Na down today to 118.   Filed Vitals:   05/18/15 2037 05/19/15 0507 05/19/15 0938 05/19/15 1304  BP: 126/62 139/64 140/62 124/61  Pulse: 78 76 74 74  Temp: 98.1 F (36.7 C) 98.1 F (36.7 C)  98.2 F (36.8 C)  TempSrc: Oral Oral  Oral  Resp: '18 18  16  '$ Height:      Weight:      SpO2: 98% 96%  94%    Inpatient medications: . amLODipine  10 mg Oral Daily  . aspirin  81 mg Oral Daily  . calcium-vitamin D  1 tablet Oral Daily  . ceFEPime (MAXIPIME) IV  1 g Intravenous Q12H  . cholecalciferol  1,000 Units Oral Daily  . dronabinol  2.5 mg Oral Daily  . feeding supplement (ENSURE ENLIVE)  237 mL Oral BID BM  . heparin  5,000 Units Subcutaneous 3 times per day  . metoprolol  100 mg Oral Daily  . multivitamin with minerals  1 tablet Oral Daily  . omega-3 acid ethyl esters  2 g Oral Daily  . oseltamivir  30 mg Oral BID  . pantoprazole  40 mg Oral Daily  . potassium & sodium phosphates  1 packet Oral TID WC & HS  . [START ON 05/20/2015] predniSONE  20 mg Oral Q breakfast  . sodium chloride  3 mL Intravenous Q12H  . vancomycin  1,000 mg Intravenous Q24H  . vitamin C  500 mg Oral Daily     acetaminophen **OR** acetaminophen, albuterol, ondansetron **OR** ondansetron (ZOFRAN) IV, triamcinolone cream  Exam: Awake but tired and a bit confused No jvd Chest clear bilat RRR no mrg Abd soft no ascites Ext no edema  Neuro Ox 2  Uosm - 283 UNa 36 CT abd ess negative     Assessment: 1 Hyponatremia - reset osmo +/- SIADH +/- low solute. U osm 283.  Na down today, pt more confused. Plan tolvaptan x 1 today. Then salt tabs/ lasix/ fluid restriction after corrected.  Check tsh/ cortisol.  2 Flu A 3 AMS 4 Gen weakness 5 HTN on norvasc  Plan - as above   Kelly Splinter MD [facility] pager [phone]    cell [phone] 05/19/2015, 6:30 PM    Recent Labs Lab 05/17/15 0612 05/18/15 0548 05/19/15 0513   NA 116* 120* 118*  K 3.0* 4.8 3.9  CL 81* 88* 85*  CO2 '25 24 27  '$ GLUCOSE 112* 98 107*  BUN '9 15 17  '$ CREATININE 0.61 0.71 0.81  CALCIUM 8.5* 8.7* 8.9  PHOS 1.9* 2.0* 2.7    Recent Labs Lab 05/15/15 1815 05/17/15 0612 05/19/15 0513  AST '27 24 17  '$ ALT '19 18 17  '$ ALKPHOS 68 62 62  BILITOT 1.1 1.1 0.8  PROT 6.8 6.1* 6.5  ALBUMIN 3.5 3.4* 3.4*  3.4*    Recent Labs Lab 05/13/15 0511 05/15/15 1815  05/17/15 0612 05/18/15 0548 05/19/15 0513  WBC 5.1 5.5  < > 5.7 5.5 6.9  NEUTROABS 3.2 4.0  --  3.4  --   --   HGB 13.0 13.7  < > 13.0 12.7 12.9  HCT 38.7 37.7  < > 35.2* 35.7* 37.1  MCV 83.8 78.5  < > 77.2* 79.7 80.3  PLT 153 170  < > 207 225 239  < > = values in this interval not displayed.

## 2015-05-19 NOTE — Progress Notes (Signed)
CRITICAL VALUE ALERT  Critical value received:  Sodium 112  Date of notification:  05/19/2015  Time of notification:  2018  Critical value read back:Yes.    Nurse who received alert:  J.Foy Mungia RN  MD notified (1st page):  K.Shorr  Time of first page:  2021  MD notified (2nd page):  Time of second page:  Responding MD:   Time MD responded:  2022, no new orders given and will continue to monitor

## 2015-05-19 NOTE — Progress Notes (Signed)
Lab reports sodium is 118 this morning (was 120 yesterday)

## 2015-05-19 NOTE — Progress Notes (Signed)
Occupational Therapy Treatment Patient Details Name: Cassidy Ellison MRN: [id number] DOB: 09-Dec-1921 Today's Date: 05/19/2015    History of present illness 82 yo female admitted with hyponatremia. Hx of HTN, DVT, RLS   OT comments  Pt needed increased A this day. Reported to RN  Follow Up Recommendations  Home health OT;Supervision/Assistance - 24 hour;Other (comment) (may need increase A at ILF or AL?)    Equipment Recommendations  Tub/shower seat       Precautions / Restrictions Precautions Precautions: Fall;Other (comment) Precaution Comments: droplet Restrictions Weight Bearing Restrictions: No       Mobility Bed Mobility Overal bed mobility: Needs Assistance Bed Mobility: Supine to Sit     Supine to sit: Min assist        Transfers Overall transfer level: Needs assistance Equipment used: 4-wheeled walker Transfers: Sit to/from Stand Sit to Stand: Mod assist         General transfer comment: for safety, proper use of rollator (locking/unlocking brakes), hand placement        ADL       Grooming: Minimal assistance;Sitting Grooming Details (indicate cue type and reason): pt needed VC for how to brush teeth in sitting rather than standing.  Reported to RN.  OT does not feel this pt would have ask this question on Friday.                 Toilet Transfer: Moderate assistance;BSC;Cueing for sequencing;Stand-pivot;RW   Toileting- Clothing Manipulation and Hygiene: Moderate assistance;Sit to/from stand;Cueing for sequencing         General ADL Comments: pt not as energetic this day.  Pt needed increased VC and encouragement.  Reported to RN                Cognition   Behavior During Therapy: [facility] for tasks assessed/performed Overall Cognitive Status: No family/caregiver present to determine baseline cognitive functioning                               General Comments      Pertinent Vitals/ Pain       Pain Assessment: No/denies  pain         Frequency Min 2X/week     Progress Toward Goals  OT Goals(current goals can now be found in the care plan section)  Progress towards OT goals: Not progressing toward goals - comment (pt needed increased A this day)     Plan Discharge plan needs to be updated       End of Session Equipment Utilized During Treatment: Rolling walker   Activity Tolerance Patient tolerated treatment well   Patient Left in chair;with call bell/phone within reach;with family/visitor present;with chair alarm set   Nurse Communication Mobility status        Time: 3009-2330 OT Time Calculation (min): 20 min  Charges: OT General Charges $OT Visit: 1 Procedure OT Treatments $Self Care/Home Management : 8-22 mins  Ameisha Mcclellan D 05/19/2015, 11:31 AM

## 2015-05-20 LAB — CBC WITH DIFFERENTIAL/PLATELET
BASOS ABS: 0 10*3/uL (ref 0.0–0.1)
BASOS PCT: 0 %
Eosinophils Absolute: 0 10*3/uL (ref 0.0–0.7)
Eosinophils Relative: 0 %
HEMATOCRIT: 40.2 % (ref 36.0–46.0)
HEMOGLOBIN: 14.5 g/dL (ref 12.0–15.0)
Lymphocytes Relative: 25 %
Lymphs Abs: 2.1 10*3/uL (ref 0.7–4.0)
MCH: 28.8 pg (ref 26.0–34.0)
MCHC: 36.1 g/dL — ABNORMAL HIGH (ref 30.0–36.0)
MCV: 79.8 fL (ref 78.0–100.0)
Monocytes Absolute: 0.7 10*3/uL (ref 0.1–1.0)
Monocytes Relative: 8 %
NEUTROS ABS: 5.5 10*3/uL (ref 1.7–7.7)
NEUTROS PCT: 66 %
Platelets: 289 10*3/uL (ref 150–400)
RBC: 5.04 MIL/uL (ref 3.87–5.11)
RDW: 12.6 % (ref 11.5–15.5)
WBC: 8.3 10*3/uL (ref 4.0–10.5)

## 2015-05-20 LAB — COMPREHENSIVE METABOLIC PANEL
ALBUMIN: 3.8 g/dL (ref 3.5–5.0)
ALK PHOS: 65 U/L (ref 38–126)
ALT: 23 U/L (ref 14–54)
AST: 21 U/L (ref 15–41)
Anion gap: 9 (ref 5–15)
BILIRUBIN TOTAL: 0.8 mg/dL (ref 0.3–1.2)
BUN: 18 mg/dL (ref 6–20)
CALCIUM: 9.6 mg/dL (ref 8.9–10.3)
CO2: 27 mmol/L (ref 22–32)
Chloride: 88 mmol/L — ABNORMAL LOW (ref 101–111)
Creatinine, Ser: 0.92 mg/dL (ref 0.44–1.00)
GFR calc Af Amer: >60 mL/min (ref >60)
GFR calc non Af Amer: 53 mL/min — ABNORMAL LOW (ref >60)
GLUCOSE: 109 mg/dL — AB (ref 65–99)
POTASSIUM: 4.1 mmol/L (ref 3.5–5.1)
SODIUM: 124 mmol/L — AB (ref 135–145)
TOTAL PROTEIN: 7.1 g/dL (ref 6.5–8.1)

## 2015-05-20 LAB — SODIUM: Sodium: 127 mmol/L — ABNORMAL LOW (ref 135–145)

## 2015-05-20 LAB — CORTISOL: Cortisol, Plasma: 11.7 ug/dL

## 2015-05-20 LAB — TSH: TSH: 5.434 u[IU]/mL — ABNORMAL HIGH (ref 0.350–4.500)

## 2015-05-20 NOTE — Progress Notes (Signed)
[facility] Progress Note   Subjective: serum Na up to 124 this am and 127 last draw.   Filed Vitals:   05/19/15 2047 05/20/15 0421 05/20/15 0950 05/20/15 1500  BP: 135/76 128/90 118/66 125/85  Pulse: 84 79 96 85  Temp: 97.7 F (36.5 C) 98.4 F (36.9 C)  98.9 F (37.2 C)  TempSrc: Oral Oral  Oral  Resp: '16 16  18  '$ Height:      Weight:      SpO2: 98% 94%  96%    Inpatient medications: . amLODipine  10 mg Oral Daily  . aspirin  81 mg Oral Daily  . calcium-vitamin D  1 tablet Oral Daily  . ceFEPime (MAXIPIME) IV  1 g Intravenous Q12H  . cholecalciferol  1,000 Units Oral Daily  . dronabinol  2.5 mg Oral Daily  . feeding supplement (ENSURE ENLIVE)  237 mL Oral BID BM  . heparin  5,000 Units Subcutaneous 3 times per day  . metoprolol  100 mg Oral Daily  . multivitamin with minerals  1 tablet Oral Daily  . omega-3 acid ethyl esters  2 g Oral Daily  . oseltamivir  30 mg Oral BID  . pantoprazole  40 mg Oral Daily  . predniSONE  20 mg Oral Q breakfast  . sodium chloride  3 mL Intravenous Q12H  . vancomycin  1,000 mg Intravenous Q24H  . vitamin C  500 mg Oral Daily     acetaminophen **OR** acetaminophen, albuterol, ondansetron **OR** ondansetron (ZOFRAN) IV, triamcinolone cream  Exam: Awake but tired and a bit confused No jvd Chest clear bilat RRR no mrg Abd soft no ascites Ext no edema  Neuro Ox 2  Uosm - 283 UNa 36 CT abd ess negative     Assessment: 1 Hyponatremia - sp tolvaptan has improved.  DC'd tolvaptan.  Continue off of fluid restriction for now as Na rising. TSH and cortisol ok. OK for dc when Na stable. Consider salt tabs as time of dc with fu labs in 1-2 wks by PCP.  2 Flu A 3 AMS 4 Gen weakness 5 HTN on norvasc  Plan - would consider Rx with Na tabs and fluid restriction when Na levels off (NaCl 2 gm bid to start, range 1-3 gm tid (max 9gm/day)). No other suggestions, will sign off.    Kelly Splinter MD [facility] pager [phone]    cell [phone] 05/20/2015, 3:59 PM    Recent Labs Lab 05/17/15 0612 05/18/15 0548 05/19/15 0513 05/19/15 1855 05/20/15 0235 05/20/15 1035  NA 116* 120* 118* 112* 124* 127*  K 3.0* 4.8 3.9  --  4.1  --   CL 81* 88* 85*  --  88*  --   CO2 '25 24 27  '$ --  27  --   GLUCOSE 112* 98 107*  --  109*  --   BUN '9 15 17  '$ --  18  --   CREATININE 0.61 0.71 0.81  --  0.92  --   CALCIUM 8.5* 8.7* 8.9  --  9.6  --   PHOS 1.9* 2.0* 2.7  --   --   --     Recent Labs Lab 05/17/15 0612 05/19/15 0513 05/20/15 0235  AST '24 17 21  '$ ALT '18 17 23  '$ ALKPHOS 62 62 65  BILITOT 1.1 0.8 0.8  PROT 6.1* 6.5 7.1  ALBUMIN 3.4* 3.4*  3.4* 3.8    Recent Labs Lab 05/15/15 1815  05/17/15 0612 05/18/15 0548  05/19/15 0513 05/20/15 0235  WBC 5.5  < > 5.7 5.5 6.9 8.3  NEUTROABS 4.0  --  3.4  --   --  5.5  HGB 13.7  < > 13.0 12.7 12.9 14.5  HCT 37.7  < > 35.2* 35.7* 37.1 40.2  MCV 78.5  < > 77.2* 79.7 80.3 79.8  PLT 170  < > 207 225 239 289  < > = values in this interval not displayed.

## 2015-05-20 NOTE — Care Management Important Message (Signed)
Important Message  Patient Details IM Letter given to Cookie/Case Manager to present to Patient Name: GERALYNN CAPRI MRN: [id number] Date of Birth: Jul 09, 1921   Medicare Important Message Given:  Yes    Camillo Flaming 05/20/2015, 11:21 AMImportant Message  Patient Details  Name: PATRIZIA PAULE MRN: [id number] Date of Birth: 1922-02-26   Medicare Important Message Given:  Yes    Camillo Flaming 05/20/2015, 11:21 AM

## 2015-05-20 NOTE — Progress Notes (Signed)
Correction pt is from [facility] living at [facility] and returning.

## 2015-05-20 NOTE — Progress Notes (Signed)
Physical Therapy Treatment Patient Details Name: Cassidy Ellison MRN: [id number] DOB: 27-Nov-1921 Today's Date: 05/20/2015    History of Present Illness 82 yo female admitted with hyponatremia. Hx of HTN, DVT, RLS    PT Comments    Pt able to ambulate in hall today and decreased confusion compared to yesterday's OT session, but did not do as well as previous PT session. Cues for safety with use of rollator brakes and to make sure she is fully in front of chair/BSC prior to sitting. Pt states she feels she has no energy, but feels better than yesterday.  As long as she continues to make progress, feel she can return to [facility] with HHPT.  May need A for OOB activity until she gets stronger.  Pt states she has paid for extra people to come in and help her, unsure how much they are there for or how much they assist.  Follow Up Recommendations  Home health PT;Supervision for mobility/OOB     Equipment Recommendations  None recommended by PT    Recommendations for Other Services       Precautions / Restrictions Precautions Precautions: Fall;Other (comment) Precaution Comments: droplet Restrictions Weight Bearing Restrictions: No    Mobility  Bed Mobility Overal bed mobility: Needs Assistance Bed Mobility: Supine to Sit     Supine to sit: Supervision;HOB elevated     General bed mobility comments: HOB elevated with rail  Transfers Overall transfer level: Needs assistance Equipment used: 4-wheeled walker Transfers: Sit to/from Stand Sit to Stand: Min guard         General transfer comment: cues for safety, hand placement, feeling back for sitting surface  Ambulation/Gait Ambulation/Gait assistance: Min assist;Min guard Ambulation Distance (Feet): 120 Feet Assistive device: 4-wheeled walker Gait Pattern/deviations: Step-through pattern Gait velocity: decreased   General Gait Details: Pt with a lot of start/stops and not a smooth cadence. Would ambulate 5' then stop,  then 5' again   Stairs            Wheelchair Mobility    Modified Rankin (Stroke Patients Only)       Balance     Sitting balance-Leahy Scale: Good     Standing balance support: During functional activity Standing balance-Leahy Scale: Fair Standing balance comment: Pt able to clean self after toileting                    Cognition Arousal/Alertness: Awake/alert Behavior During Therapy: WFL for tasks assessed/performed Overall Cognitive Status: No family/caregiver present to determine baseline cognitive functioning                      Exercises General Exercises - Lower Extremity Ankle Circles/Pumps: AROM;Both;10 reps;Seated Hip ABduction/ADduction: AROM;Both;10 reps;Seated Straight Leg Raises: Strengthening;Both;10 reps;Seated    General Comments        Pertinent Vitals/Pain Pain Assessment: No/denies pain    Home Living                      Prior Function            PT Goals (current goals can now be found in the care plan section) Acute Rehab PT Goals Patient Stated Goal: back to her apartment PT Goal Formulation: With patient/family Time For Goal Achievement: 05/30/15 Potential to Achieve Goals: Good Progress towards PT goals: Progressing toward goals    Frequency  Min 3X/week    PT Plan Current plan remains appropriate    Co-evaluation  End of Session Equipment Utilized During Treatment: Gait belt Activity Tolerance: Patient tolerated treatment well Patient left: in chair;with call bell/phone within reach;with chair alarm set     Time: 1321-1340 PT Time Calculation (min) (ACUTE ONLY): 19 min  Charges:  $Gait Training: 8-22 mins                    G Codes:      Setsuko Robins LUBECK 05/20/2015, 2:25 PM

## 2015-05-20 NOTE — Care Management Note (Signed)
Case Management Note  Patient Details  Name: Cassidy Ellison MRN: [id number] Date of Birth: [birth year]  Subjective/Objective:     Pt admitted with hyponatremia               Action/Plan:  Pt from [facility] and plan to return with [facility]   Expected Discharge Date:   (unknown)               Expected Discharge Plan:  Assisted Living / Rest Home  In-House Referral:  Clinical Social Work  Discharge planning Services  CM Consult  Post Acute Care Choice:    Choice offered to:  NA  DME Arranged:    DME Agency:  [facility]  HH Arranged:  PT HH Agency:  [facility]  Status of Service:  Completed, signed off  Medicare Important Message Given:  Yes Date Medicare IM Given:    Medicare IM give by:    Date Additional Medicare IM Given:    Additional Medicare Important Message give by:     If discussed at [place] of Stay Meetings, dates discussed:    Additional CommentsPurcell Mouton, RN 05/20/2015, 12:50 PM

## 2015-05-20 NOTE — Consult Note (Signed)
   [facility] Inpatient Consult   05/20/2015  Cassidy Ellison 1922/03/25 [id number]    [facility] Care Management referral received. Went to bedside to speak with patient about [facility] Management program services. She pleasantly declines. Made inpatient RNCM aware.   Marthenia Rolling, MSN-Ed, RN,BSN [facility] Liaison [phone]

## 2015-05-20 NOTE — Progress Notes (Signed)
[facility] PROGRESS NOTE  Cassidy Ellison EXB:[id number] DOB: 04-18-1922 DOA: 05/15/2015 PCP: Antony Blackbird, MD  Summary 05/16/15: Pleasant 82 year old female with recurrent hyponatremia raising possibility of SIADH. Also has abnormal CT chest-?infection/?neoplasticprocess.Will obtain brain mri/2D echo/ct abdomen/pelvis, consult pulmonary/nephrology, give Doxycycline for now, and restrict fluid intake. Give regular diet. Will defer hyponatremia management to nephrology. Discussed care pan with daughter at bedside. 05/17/15: Appreciate nephrology/pulmonary. Patient has influenza type A. This may be responsible for the poor appetite. Brain MRI 2-D echo/CT abdomen and pelvis unrevealing. Patient had diarrhea this afternoon. May be related to antibiotics. We'll therefore discontinue doxycycline, start Tamiflu and continue recommendations from nephrology/pulmonary. Patient says she feels about the same. I discussed care plan with her daughter at the bedside. 05/18/15: [facility]. Sodium improved to 120. Patient lethargic. Ate some french fries. Discussed with daughter at bedside. Continue current measures with nephrology direction. If lethargy increasing despite sodium improving, would worry about partially treated infection. In which case, we'll would resume antibiotics. 05/19/15: Appreciate pulmonary/nephrology. Patient lethargic. Oral intake remains. Sodium is 118. Not sure why patient not eating-?sequelae of Influenza, ?residual bacterial infection, ?hyponatremia. Stool studies unremarkable. Will give antibiotics for Nosocomial organisms, and try marinol, defer management of hyponatremia to Nephrology. 05/20/15: [facility]. Sodium improved to 124. Stool studies unremarkable. Patient has more life today. She said she was soaking wet last night. Will continue current management including 5/cefepime/Marinol/Tamiflu and follow nephrology recommendations. Plan Hyponatremia/Acute bronchitis with  bronchospasm/Acute encephalopathy/Weakness/Cough/influenza type A/diarrhea  Continue Tamiflu to complete course  Fluid restriction  Follow pulmonary/Nephrology recommendations  Vanc/Cefepime/Marinol Essential hypertension/GERD (gastroesophageal reflux disease)/RLS (restless legs syndrome)  Continue current management Code Status: Full Code Family Communication: Daughter at bedside Disposition Plan: ?Home eventually   Consultants:  Nephrology  Pulmonary  Procedures:    Antibiotics:  Doxycycline 05/16/2015> 05/17/2015  Tamiflu 05/17/2015>  Vanc 05/19/15  Cefepime 05/19/15  HPI/Subjective: She thinks she feels a little bit better.  Objective: Filed Vitals:   05/20/15 0421 05/20/15 0950  BP: 128/90 118/66  Pulse: 79 96  Temp: 98.4 F (36.9 C)   Resp: 16     Intake/Output Summary (Last 24 hours) at 05/20/15 1045 Last data filed at 05/20/15 1011  Gross per 24 hour  Intake    660 ml  Output   2120 ml  Net  -1460 ml   Filed Weights   05/15/15 2035  Weight: 61.4 kg (135 lb 5.8 oz)    Exam:   General:  Comfortable at rest.  Cardiovascular: S1-S2 normal. No murmurs. Pulse regular.  Respiratory: Good air entry bilaterally. No rhonchi or rales.  Abdomen: Soft and nontender. Normal bowel sounds. No organomegaly.  Musculoskeletal: No pedal edema   Neurological: Intact  Data Reviewed: Basic Metabolic Panel:  Recent Labs Lab 05/16/15 0403  05/16/15 1614 05/17/15 0612 05/18/15 0548 05/19/15 0513 05/19/15 1855 05/20/15 0235  NA 117*  < > 112* 116* 120* 118* 112* 124*  K 3.2*  < > 3.4* 3.0* 4.8 3.9  --  4.1  CL 84*  < > 82* 81* 88* 85*  --  88*  CO2 23  < > 21* '25 24 27  '$ --  27  GLUCOSE 139*  < > 128* 112* 98 107*  --  109*  BUN 11  < > '10 9 15 17  '$ --  18  CREATININE 0.64  < > 0.58 0.61 0.71 0.81  --  0.92  CALCIUM 8.4*  < > 8.4* 8.5* 8.7* 8.9  --  9.6  MG 1.7  --   --  1.7 2.1  --   --   --   PHOS 2.8  --   --  1.9* 2.0* 2.7  --   --   < > =  values in this interval not displayed. Liver Function Tests:  Recent Labs Lab 05/15/15 1815 05/17/15 0612 05/19/15 0513 05/20/15 0235  AST '27 24 17 21  '$ ALT '19 18 17 23  '$ ALKPHOS 68 62 62 65  BILITOT 1.1 1.1 0.8 0.8  PROT 6.8 6.1* 6.5 7.1  ALBUMIN 3.5 3.4* 3.4*  3.4* 3.8   No results for input(s): LIPASE, AMYLASE in the last 168 hours. No results for input(s): AMMONIA in the last 168 hours. CBC:  Recent Labs Lab 05/15/15 1815 05/16/15 0403 05/17/15 0612 05/18/15 0548 05/19/15 0513 05/20/15 0235  WBC 5.5 3.4* 5.7 5.5 6.9 8.3  NEUTROABS 4.0  --  3.4  --   --  5.5  HGB 13.7 13.0 13.0 12.7 12.9 14.5  HCT 37.7 35.9* 35.2* 35.7* 37.1 40.2  MCV 78.5 78.6 77.2* 79.7 80.3 79.8  PLT 170 173 207 225 239 289   Cardiac Enzymes:  Recent Labs Lab 05/15/15 1815 05/19/15 1855  CKTOTAL  --  31*  TROPONINI <0.03  --    BNP (last 3 results) No results for input(s): BNP in the last 8760 hours.  ProBNP (last 3 results) No results for input(s): PROBNP in the last 8760 hours.  CBG: No results for input(s): GLUCAP in the last 168 hours.  Recent Results (from the past 240 hour(s))  Respiratory virus panel     Status: Abnormal   Collection Time: 05/15/15  9:59 PM  Result Value Ref Range Status   Respiratory Syncytial Virus A Negative Negative Final   Respiratory Syncytial Virus B Negative Negative Final   Influenza A Positive (A) Negative Final    Comment: Subtype: H3   Influenza B Negative Negative Final   Parainfluenza 1 Negative Negative Final   Parainfluenza 2 Negative Negative Final   Parainfluenza 3 Negative Negative Final   Metapneumovirus Negative Negative Final   Rhinovirus Negative Negative Final   Adenovirus Negative Negative Final    Comment: (NOTE) Performed At: [facility] [place], [place] [id number] Lindon Romp MD [id number]   C difficile quick scan w PCR reflex     Status: None   Collection Time: 05/17/15  2:26 PM  Result  Value Ref Range Status   C Diff antigen NEGATIVE NEGATIVE Final   C Diff toxin NEGATIVE NEGATIVE Final   C Diff interpretation Negative for toxigenic C. difficile  Final     Studies: No results found.  Scheduled Meds: . amLODipine  10 mg Oral Daily  . aspirin  81 mg Oral Daily  . calcium-vitamin D  1 tablet Oral Daily  . ceFEPime (MAXIPIME) IV  1 g Intravenous Q12H  . cholecalciferol  1,000 Units Oral Daily  . dronabinol  2.5 mg Oral Daily  . feeding supplement (ENSURE ENLIVE)  237 mL Oral BID BM  . heparin  5,000 Units Subcutaneous 3 times per day  . metoprolol  100 mg Oral Daily  . multivitamin with minerals  1 tablet Oral Daily  . omega-3 acid ethyl esters  2 g Oral Daily  . oseltamivir  30 mg Oral BID  . pantoprazole  40 mg Oral Daily  . predniSONE  20 mg Oral Q breakfast  . sodium chloride  3 mL Intravenous Q12H  .  tolvaptan  15 mg Oral Q24H  . vancomycin  1,000 mg Intravenous Q24H  . vitamin C  500 mg Oral Daily   Continuous Infusions:    Time spent: 20 minutes    Mairyn Lenahan  [facility] Pager [phone]. If 7PM-7AM, please contact night-coverage at [email or web address], password [facility] 05/20/2015, 10:45 AM  LOS: 5 days

## 2015-05-21 DIAGNOSIS — J1100 Influenza due to unidentified influenza virus with unspecified type of pneumonia: Secondary | ICD-10-CM

## 2015-05-21 DIAGNOSIS — I10 Essential (primary) hypertension: Secondary | ICD-10-CM

## 2015-05-21 DIAGNOSIS — G9340 Encephalopathy, unspecified: Secondary | ICD-10-CM

## 2015-05-21 LAB — CBC
HCT: 40.4 % (ref 36.0–46.0)
HEMOGLOBIN: 14.1 g/dL (ref 12.0–15.0)
MCH: 28.3 pg (ref 26.0–34.0)
MCHC: 34.9 g/dL (ref 30.0–36.0)
MCV: 81 fL (ref 78.0–100.0)
PLATELETS: 291 10*3/uL (ref 150–400)
RBC: 4.99 MIL/uL (ref 3.87–5.11)
RDW: 12.9 % (ref 11.5–15.5)
WBC: 9.2 10*3/uL (ref 4.0–10.5)

## 2015-05-21 LAB — COMPREHENSIVE METABOLIC PANEL
ALBUMIN: 3.6 g/dL (ref 3.5–5.0)
ALK PHOS: 64 U/L (ref 38–126)
ALT: 25 U/L (ref 14–54)
ANION GAP: 10 (ref 5–15)
AST: 21 U/L (ref 15–41)
BUN: 20 mg/dL (ref 6–20)
CHLORIDE: 90 mmol/L — AB (ref 101–111)
CO2: 28 mmol/L (ref 22–32)
Calcium: 9.6 mg/dL (ref 8.9–10.3)
Creatinine, Ser: 0.89 mg/dL (ref 0.44–1.00)
GFR calc non Af Amer: 55 mL/min — ABNORMAL LOW (ref >60)
GLUCOSE: 107 mg/dL — AB (ref 65–99)
POTASSIUM: 4.4 mmol/L (ref 3.5–5.1)
SODIUM: 128 mmol/L — AB (ref 135–145)
Total Bilirubin: 1.1 mg/dL (ref 0.3–1.2)
Total Protein: 6.9 g/dL (ref 6.5–8.1)

## 2015-05-21 NOTE — Progress Notes (Signed)
Occupational Therapy Treatment Patient Details Name: Cassidy Ellison MRN: [id number] DOB: Apr 24, 1922 Today's Date: 05/21/2015    History of present illness 82 yo female admitted with hyponatremia. Hx of HTN, DVT, RLS   OT comments  Pt would benefit from SNF rather than DC to ILF unless pt/family wanted to hire A at [place]. Pt needs 24/7 A for safety and fall prevention. Pt min A with toileting.   Follow Up Recommendations  SNF    Equipment Recommendations  None recommended by OT    Recommendations for Other Services      Precautions / Restrictions Precautions Precautions: Fall;Other (comment) Precaution Comments: droplet Restrictions Weight Bearing Restrictions: No       Mobility Bed Mobility               General bed mobility comments: pt in chair  Transfers Overall transfer level: Needs assistance Equipment used: 4-wheeled walker Transfers: Sit to/from Stand;Stand Pivot Transfers Sit to Stand: Min assist Stand pivot transfers: Min assist       General transfer comment: cues for safety, hand placement, feeling back for sitting surface    Balance                                   ADL       Grooming: Set up;Standing                   Toilet Transfer: Minimal assistance;RW;BSC;Stand-pivot;Cueing for sequencing;Cueing for safety   Toileting- Clothing Manipulation and Hygiene: Minimal assistance;Cueing for sequencing;Sit to/from stand;Cueing for safety         General ADL Comments: pt would benefit from SNF prior to returning to ILF unless family wanted to hire A. Pt needs A with all ADL activity at this time and is not safe to be I                    Extremity/Trunk Assessment   Exercises   Shoulder Instructions   General Comments      Pertinent Vitals/ Pain       Pain Assessment: No/denies pain         Progress Toward Goals  OT Goals(current goals can now be found in the care plan section)  Progress towards OT  goals: Progressing toward goals (slowly)     Plan Discharge plan needs to be updated    Co-evaluation                 End of Session     Activity Tolerance Patient limited by fatigue   Patient Left in chair;with call bell/phone within reach;with chair alarm set   Nurse Communication          Time: [phone] OT Time Calculation (min): 14 min  Charges: OT General Charges $OT Visit: 1 Procedure OT Treatments $Self Care/Home Management : 8-22 mins  Jennavieve Arrick D 05/21/2015, 10:15 AM

## 2015-05-21 NOTE — Progress Notes (Signed)
PROGRESS NOTE  Cassidy Ellison CBS:[id number] DOB: 03/31/1922 DOA: 05/15/2015 PCP: Antony Blackbird, MD  HPI: 82 y.o. female with PMH of hypertension, GERD, DVT, carpel tunnel syndrome, RLS, who presents with generalized weakness, nausea, poor oral intake, and mild confusion, admitted on 1/5.   Subjective / 24 H Interval events - confused this morning, doesn't know where she is. At baseline she is driving per her daughter.   Assessment/Plan: Principal Problem:   Hyponatremia Active Problems:   Acute bronchitis with bronchospasm   Hypertension   Weakness   Essential hypertension   GERD (gastroesophageal reflux disease)   Cough   RLS (restless legs syndrome)   Acute encephalopathy   Influenza with pneumonia   Hyponatremia - Na 112 on admission, nephrology consulted, appreciate help, she was on Tolvaptan x 1 dose - slowly improving, Na 128 today  - TSH/cortisol unremarkable - salt tabs on discharge per nephrology   Acute encephalopathy / intermittent confusion - MRI brain negative, TSH unremarkable - has been on prednisone, ?related to that, tapered off and will discontinue today  Acute bronchitis / URI - possible wheezing on admission - Influenza A positive, patient on Tamiflu - due to progressive confusion patient started on broad spectrum antibiotics Vanc/Cefepime on 1/9 - also on steroids for presumed wheezing  Right upper lung small area of ground glass opacification - pulmonary consulted, appreciate input - follow up CT chest 3-6 months  Weakness - PT consult, OT consult. Recommending SNF. SW consult  HTN - continue Norvasc, Metoprolol - good control today    Diet: Diet regular Room service appropriate?: Yes; Fluid consistency:: Thin Fluids: none  DVT Prophylaxis: heparin  Code Status: Full Code Family Communication: d/w daughter bedside  Disposition Plan: home vs SNF when ready   Barriers to discharge: confusion  Consultants:  Nephrology    Procedures:  2D echo Study Conclusions - Left ventricle: The cavity size was normal. Systolic function wasnormal. The estimated ejection fraction was in the range of 60% to 65%. Wall motion was normal; there were no regional wallmotion abnormalities. Left ventricular diastolic functionparameters were normal. Atrial septum: No defect or patent foramen ovale was identified.   Antibiotics Doxycycline 05/16/2015> 05/17/2015 Tamiflu 05/17/2015> Vanc 05/19/15 Cefepime 05/19/15   Studies  No results found.  Objective  Filed Vitals:   05/20/15 2201 05/21/15 0519 05/21/15 0947 05/21/15 1400  BP: 135/65 134/73 109/60 109/63  Pulse: 82 81  76  Temp: 98.2 F (36.8 C) 97.2 F (36.2 C)  97.9 F (36.6 C)  TempSrc: Oral Oral  Oral  Resp: '18 18  20  '$ Height:      Weight:      SpO2: 95% 94%  98%    Intake/Output Summary (Last 24 hours) at 05/21/15 1529 Last data filed at 05/21/15 0954  Gross per 24 hour  Intake    250 ml  Output    950 ml  Net   -700 ml   Filed Weights   05/15/15 2035  Weight: 61.4 kg (135 lb 5.8 oz)    Exam:  GENERAL: NAD, alert to person, thinks is 2018, she is not sure where she is  HEENT: no scleral icterus, PERRL  NECK: supple, no LAD  LUNGS: CTA biL, no wheezing  HEART: RRR without MRG  ABDOMEN: soft, non tender  EXTREMITIES: no clubbing / cyanosis  NEUROLOGIC: non focal   Data Reviewed: Basic Metabolic Panel:  Recent Labs Lab 05/16/15 0403  05/17/15 0612 05/18/15 8466 05/19/15 5993 05/19/15 1855 05/20/15 0235  05/20/15 1035 05/21/15 0538  NA 117*  < > 116* 120* 118* 112* 124* 127* 128*  K 3.2*  < > 3.0* 4.8 3.9  --  4.1  --  4.4  CL 84*  < > 81* 88* 85*  --  88*  --  90*  CO2 23  < > '25 24 27  '$ --  27  --  28  GLUCOSE 139*  < > 112* 98 107*  --  109*  --  107*  BUN 11  < > '9 15 17  '$ --  18  --  20  CREATININE 0.64  < > 0.61 0.71 0.81  --  0.92  --  0.89  CALCIUM 8.4*  < > 8.5* 8.7* 8.9  --  9.6  --  9.6  MG 1.7  --  1.7  2.1  --   --   --   --   --   PHOS 2.8  --  1.9* 2.0* 2.7  --   --   --   --   < > = values in this interval not displayed. Liver Function Tests:  Recent Labs Lab 05/15/15 1815 05/17/15 0612 05/19/15 0513 05/20/15 0235 05/21/15 0538  AST '27 24 17 21 21  '$ ALT '19 18 17 23 25  '$ ALKPHOS 68 62 62 65 64  BILITOT 1.1 1.1 0.8 0.8 1.1  PROT 6.8 6.1* 6.5 7.1 6.9  ALBUMIN 3.5 3.4* 3.4*  3.4* 3.8 3.6   CBC:  Recent Labs Lab 05/15/15 1815  05/17/15 0612 05/18/15 0548 05/19/15 0513 05/20/15 0235 05/21/15 0538  WBC 5.5  < > 5.7 5.5 6.9 8.3 9.2  NEUTROABS 4.0  --  3.4  --   --  5.5  --   HGB 13.7  < > 13.0 12.7 12.9 14.5 14.1  HCT 37.7  < > 35.2* 35.7* 37.1 40.2 40.4  MCV 78.5  < > 77.2* 79.7 80.3 79.8 81.0  PLT 170  < > 207 225 239 289 291  < > = values in this interval not displayed. Cardiac Enzymes:  Recent Labs Lab 05/15/15 1815 05/19/15 1855  CKTOTAL  --  31*  TROPONINI <0.03  --     Recent Results (from the past 240 hour(s))  Respiratory virus panel     Status: Abnormal   Collection Time: 05/15/15  9:59 PM  Result Value Ref Range Status   Respiratory Syncytial Virus A Negative Negative Final   Respiratory Syncytial Virus B Negative Negative Final   Influenza A Positive (A) Negative Final    Comment: Subtype: H3   Influenza B Negative Negative Final   Parainfluenza 1 Negative Negative Final   Parainfluenza 2 Negative Negative Final   Parainfluenza 3 Negative Negative Final   Metapneumovirus Negative Negative Final   Rhinovirus Negative Negative Final   Adenovirus Negative Negative Final    Comment: (NOTE) Performed At: [facility] [place], [place] [id number] Lindon Romp MD [id number]   C difficile quick scan w PCR reflex     Status: None   Collection Time: 05/17/15  2:26 PM  Result Value Ref Range Status   C Diff antigen NEGATIVE NEGATIVE Final   C Diff toxin NEGATIVE NEGATIVE Final   C Diff interpretation Negative for  toxigenic C. difficile  Final  Culture, blood (routine x 2)     Status: None (Preliminary result)   Collection Time: 05/19/15  6:55 PM  Result Value Ref Range Status   Specimen Description BLOOD  RIGHT ARM  Final   Special Requests BOTTLES DRAWN AEROBIC AND ANAEROBIC  10CC  Final   Culture   Final    NO GROWTH 2 DAYS Performed at [facility]    Report Status PENDING  Incomplete  Culture, blood (routine x 2)     Status: None (Preliminary result)   Collection Time: 05/19/15  7:06 PM  Result Value Ref Range Status   Specimen Description BLOOD LEFT ARM  Final   Special Requests BOTTLES DRAWN AEROBIC AND ANAEROBIC  [place]  Final   Culture   Final    NO GROWTH 2 DAYS Performed at [facility]    Report Status PENDING  Incomplete     Scheduled Meds: . amLODipine  10 mg Oral Daily  . aspirin  81 mg Oral Daily  . calcium-vitamin D  1 tablet Oral Daily  . ceFEPime (MAXIPIME) IV  1 g Intravenous Q12H  . cholecalciferol  1,000 Units Oral Daily  . dronabinol  2.5 mg Oral Daily  . feeding supplement (ENSURE ENLIVE)  237 mL Oral BID BM  . heparin  5,000 Units Subcutaneous 3 times per day  . metoprolol  100 mg Oral Daily  . multivitamin with minerals  1 tablet Oral Daily  . omega-3 acid ethyl esters  2 g Oral Daily  . oseltamivir  30 mg Oral BID  . pantoprazole  40 mg Oral Daily  . predniSONE  20 mg Oral Q breakfast  . sodium chloride  3 mL Intravenous Q12H  . vancomycin  1,000 mg Intravenous Q24H  . vitamin C  500 mg Oral Daily   Continuous Infusions:   Marzetta Board, MD [facility] Pager [phone]. If 7 PM - 7 AM, please contact night-coverage at [email or web address], password [facility] 05/21/2015, 3:29 PM  LOS: 6 days

## 2015-05-22 DIAGNOSIS — R531 Weakness: Secondary | ICD-10-CM

## 2015-05-22 LAB — BASIC METABOLIC PANEL
Anion gap: 9 (ref 5–15)
BUN: 26 mg/dL — AB (ref 6–20)
CHLORIDE: 88 mmol/L — AB (ref 101–111)
CO2: 28 mmol/L (ref 22–32)
CREATININE: 0.9 mg/dL (ref 0.44–1.00)
Calcium: 9.3 mg/dL (ref 8.9–10.3)
GFR calc Af Amer: >60 mL/min (ref >60)
GFR, EST NON AFRICAN AMERICAN: 54 mL/min — AB (ref >60)
GLUCOSE: 105 mg/dL — AB (ref 65–99)
POTASSIUM: 4.3 mmol/L (ref 3.5–5.1)
SODIUM: 125 mmol/L — AB (ref 135–145)

## 2015-05-22 MED ORDER — SODIUM CHLORIDE 1 G PO TABS
2.0000 g | ORAL_TABLET | Freq: Three times a day (TID) | ORAL | Status: DC
  Administered 2015-05-22 – 2015-05-24 (×5): 2 g via ORAL
  Filled 2015-05-22 (×6): qty 2

## 2015-05-22 MED ORDER — SODIUM CHLORIDE 1 G PO TABS
2.0000 g | ORAL_TABLET | Freq: Two times a day (BID) | ORAL | Status: DC
  Administered 2015-05-22: 2 g via ORAL

## 2015-05-22 NOTE — Progress Notes (Signed)
Physical Therapy Treatment Patient Details Name: Cassidy Ellison MRN: [id number] DOB: August 18, 1921 Today's Date: 05/22/2015    History of Present Illness 82 yo female admitted with hyponatremia. Hx of HTN, DVT, RLS    PT Comments    Pt participated well with session. A & O x 1 on today. Multimodal cueing needed for safe/proper use of rollator. Overall, pt was Min guard for activity on today-walked ~300 feet. Would recommend 24 hour supervision/assist due to impaired cognition and for safety. If 24 hour not possible at this time, may need to consider SNF placement and possibly reconsider pt residing in [place].   Follow Up Recommendations  Home health PT;Supervision/Assistance - 24 hour (If 24 hour supervision not possible, may need to consider SNF placement)     Equipment Recommendations  None recommended by PT    Recommendations for Other Services       Precautions / Restrictions Precautions Precautions: Fall;Other (comment) Precaution Comments: droplet Restrictions Weight Bearing Restrictions: No    Mobility  Bed Mobility               General bed mobility comments: oob in recliner  Transfers Overall transfer level: Needs assistance Equipment used: 4-wheeled walker Transfers: Sit to/from Stand Sit to Stand: Min guard         General transfer comment: close guard for safety. VCs hand placement, proper use of rollator  Ambulation/Gait Ambulation/Gait assistance: Min guard Ambulation Distance (Feet): 300 Feet Assistive device: 4-wheeled walker Gait Pattern/deviations: Step-through pattern;Decreased stride length;Drifts right/left     General Gait Details: close guard for safety. No LOB with rollator use. Intermittent unsteadiness noted.    Stairs            Wheelchair Mobility    Modified Rankin (Stroke Patients Only)       Balance           Standing balance support: During functional activity Standing balance-Leahy Scale: Fair                       Cognition Arousal/Alertness: Awake/alert Behavior During Therapy: WFL for tasks assessed/performed Overall Cognitive Status: No family/caregiver present to determine baseline cognitive functioning Area of Impairment: Orientation;Problem solving Orientation Level: Disoriented to;Situation;Time;Place   Memory: Decreased short-term memory       Problem Solving: Requires verbal cues      Exercises      General Comments        Pertinent Vitals/Pain Pain Assessment: No/denies pain    Home Living                      Prior Function            PT Goals (current goals can now be found in the care plan section) Progress towards PT goals: Progressing toward goals    Frequency  Min 3X/week    PT Plan Discharge plan needs to be updated    Co-evaluation             End of Session Equipment Utilized During Treatment: Gait belt Activity Tolerance: Patient tolerated treatment well Patient left: in chair;with call bell/phone within reach;with chair alarm set     Time: 1700-1749 PT Time Calculation (min) (ACUTE ONLY): 11 min  Charges:  $Gait Training: 8-22 mins                    G Codes:      Weston Anna, MPT Pager: [phone]

## 2015-05-22 NOTE — Progress Notes (Signed)
PROGRESS NOTE  DANIJELA VESSEY RSW:[id number] DOB: 10/28/1921 DOA: 05/15/2015 PCP: Antony Blackbird, MD  HPI: 82 y.o. female with PMH of hypertension, GERD, DVT, carpel tunnel syndrome, RLS, who presents with generalized weakness, nausea, poor oral intake, and mild confusion, admitted on 1/5.   Subjective / 24 H Interval events - more alert overnight per RN, however still confused for me this morning, didn't know where she is nor the year  Assessment/Plan: Principal Problem:   Hyponatremia Active Problems:   Acute bronchitis with bronchospasm   Hypertension   Weakness   Essential hypertension   GERD (gastroesophageal reflux disease)   Cough   RLS (restless legs syndrome)   Acute encephalopathy   Influenza with pneumonia   Hyponatremia - Na 112 on admission, nephrology consulted, appreciate help, she was on Tolvaptan x 1 dose - slowly improving up until yesterday, 128 however worsening this morning down to 125 - TSH/cortisol unremarkable - discussed with Dr. Jonnie Finner over the phone today, combination SIADH and low solute intake. Will start Salt tabs today and place patient on fluid restriction.  Acute encephalopathy / intermittent confusion - MRI brain negative, TSH unremarkable - has been on prednisone, ?related to that, tapered off and discontinued 1/11 - mental status with slight improvement at least overnight - may need SNF, consulted SW  Acute bronchitis / URI - possible wheezing on admission - Influenza A positive, patient on Tamiflu, finished a 5 day course - due to progressive confusion patient started on broad spectrum antibiotics Vanc/Cefepime on 1/9. D/c Vanc, continue Cefepime.  Right upper lung small area of ground glass opacification - pulmonary consulted, appreciate input - follow up CT chest 3-6 months  Weakness - PT consult, OT consult. Recommending SNF. SW consult  HTN - continue Norvasc, Metoprolol - good control today    Diet: Diet regular Room  service appropriate?: Yes; Fluid consistency:: Thin; Fluid restriction:: 1200 mL Fluid Fluids: none  DVT Prophylaxis: heparin  Code Status: Full Code Family Communication: d/w daughter bedside  Disposition Plan: home vs SNF when ready   Barriers to discharge: confusion  Consultants:  Nephrology   Procedures:  2D echo Study Conclusions - Left ventricle: The cavity size was normal. Systolic function wasnormal. The estimated ejection fraction was in the range of 60% to 65%. Wall motion was normal; there were no regional wallmotion abnormalities. Left ventricular diastolic functionparameters were normal. Atrial septum: No defect or patent foramen ovale was identified.   Antibiotics Doxycycline 05/16/2015> 05/17/2015 Tamiflu 05/17/2015> Vanc 05/19/15 Cefepime 05/19/15   Studies  No results found.  Objective  Filed Vitals:   05/21/15 1400 05/21/15 2137 05/22/15 0354 05/22/15 1039  BP: 109/63 114/60 128/75 115/56  Pulse: 76 73 78 81  Temp: 97.9 F (36.6 C) 97.9 F (36.6 C) 98 F (36.7 C)   TempSrc: Oral Oral Oral   Resp: '20 18 16   '$ Height:      Weight:      SpO2: 98% 97% 95%     Intake/Output Summary (Last 24 hours) at 05/22/15 1321 Last data filed at 05/22/15 1309  Gross per 24 hour  Intake    300 ml  Output    250 ml  Net     50 ml   Filed Weights   05/15/15 2035  Weight: 61.4 kg (135 lb 5.8 oz)    Exam:  GENERAL: NAD, alert to person  HEENT: no scleral icterus, PERRL  NECK: supple, no LAD  LUNGS: CTA biL, no wheezing  HEART: RRR  without MRG  ABDOMEN: soft, non tender  EXTREMITIES: no clubbing / cyanosis  NEUROLOGIC: non focal   Data Reviewed: Basic Metabolic Panel:  Recent Labs Lab 05/16/15 0403  05/17/15 0612 05/18/15 6433 05/19/15 2951 05/19/15 1855 05/20/15 0235 05/20/15 1035 05/21/15 0538 05/22/15 0534  NA 117*  < > 116* 120* 118* 112* 124* 127* 128* 125*  K 3.2*  < > 3.0* 4.8 3.9  --  4.1  --  4.4 4.3  CL 84*  < > 81* 88*  85*  --  88*  --  90* 88*  CO2 23  < > '25 24 27  '$ --  27  --  28 28  GLUCOSE 139*  < > 112* 98 107*  --  109*  --  107* 105*  BUN 11  < > '9 15 17  '$ --  18  --  20 26*  CREATININE 0.64  < > 0.61 0.71 0.81  --  0.92  --  0.89 0.90  CALCIUM 8.4*  < > 8.5* 8.7* 8.9  --  9.6  --  9.6 9.3  MG 1.7  --  1.7 2.1  --   --   --   --   --   --   PHOS 2.8  --  1.9* 2.0* 2.7  --   --   --   --   --   < > = values in this interval not displayed. Liver Function Tests:  Recent Labs Lab 05/15/15 1815 05/17/15 0612 05/19/15 0513 05/20/15 0235 05/21/15 0538  AST '27 24 17 21 21  '$ ALT '19 18 17 23 25  '$ ALKPHOS 68 62 62 65 64  BILITOT 1.1 1.1 0.8 0.8 1.1  PROT 6.8 6.1* 6.5 7.1 6.9  ALBUMIN 3.5 3.4* 3.4*  3.4* 3.8 3.6   CBC:  Recent Labs Lab 05/15/15 1815  05/17/15 0612 05/18/15 0548 05/19/15 0513 05/20/15 0235 05/21/15 0538  WBC 5.5  < > 5.7 5.5 6.9 8.3 9.2  NEUTROABS 4.0  --  3.4  --   --  5.5  --   HGB 13.7  < > 13.0 12.7 12.9 14.5 14.1  HCT 37.7  < > 35.2* 35.7* 37.1 40.2 40.4  MCV 78.5  < > 77.2* 79.7 80.3 79.8 81.0  PLT 170  < > 207 225 239 289 291  < > = values in this interval not displayed. Cardiac Enzymes:  Recent Labs Lab 05/15/15 1815 05/19/15 1855  CKTOTAL  --  31*  TROPONINI <0.03  --     Recent Results (from the past 240 hour(s))  Respiratory virus panel     Status: Abnormal   Collection Time: 05/15/15  9:59 PM  Result Value Ref Range Status   Respiratory Syncytial Virus A Negative Negative Final   Respiratory Syncytial Virus B Negative Negative Final   Influenza A Positive (A) Negative Final    Comment: Subtype: H3   Influenza B Negative Negative Final   Parainfluenza 1 Negative Negative Final   Parainfluenza 2 Negative Negative Final   Parainfluenza 3 Negative Negative Final   Metapneumovirus Negative Negative Final   Rhinovirus Negative Negative Final   Adenovirus Negative Negative Final    Comment: (NOTE) Performed At: [facility] [place], [place] [id number] Lindon Romp MD [id number]   C difficile quick scan w PCR reflex     Status: None   Collection Time: 05/17/15  2:26 PM  Result Value Ref Range Status   C Diff  antigen NEGATIVE NEGATIVE Final   C Diff toxin NEGATIVE NEGATIVE Final   C Diff interpretation Negative for toxigenic C. difficile  Final  Culture, blood (routine x 2)     Status: None (Preliminary result)   Collection Time: 05/19/15  6:55 PM  Result Value Ref Range Status   Specimen Description BLOOD RIGHT ARM  Final   Special Requests BOTTLES DRAWN AEROBIC AND ANAEROBIC  10CC  Final   Culture   Final    NO GROWTH 3 DAYS Performed at [facility]    Report Status PENDING  Incomplete  Culture, blood (routine x 2)     Status: None (Preliminary result)   Collection Time: 05/19/15  7:06 PM  Result Value Ref Range Status   Specimen Description BLOOD LEFT ARM  Final   Special Requests BOTTLES DRAWN AEROBIC AND ANAEROBIC  [place]  Final   Culture   Final    NO GROWTH 3 DAYS Performed at [facility]    Report Status PENDING  Incomplete     Scheduled Meds: . amLODipine  10 mg Oral Daily  . aspirin  81 mg Oral Daily  . calcium-vitamin D  1 tablet Oral Daily  . ceFEPime (MAXIPIME) IV  1 g Intravenous Q12H  . cholecalciferol  1,000 Units Oral Daily  . dronabinol  2.5 mg Oral Daily  . feeding supplement (ENSURE ENLIVE)  237 mL Oral BID BM  . heparin  5,000 Units Subcutaneous 3 times per day  . metoprolol  100 mg Oral Daily  . multivitamin with minerals  1 tablet Oral Daily  . omega-3 acid ethyl esters  2 g Oral Daily  . pantoprazole  40 mg Oral Daily  . sodium chloride  3 mL Intravenous Q12H  . sodium chloride  2 g Oral TID WC  . vitamin C  500 mg Oral Daily   Continuous Infusions:   Marzetta Board, MD [facility] Pager [phone]. If 7 PM - 7 AM, please contact night-coverage at [email or web address], password [facility] 05/22/2015, 1:21 PM  LOS: 7 days

## 2015-05-22 NOTE — Clinical Social Work Note (Signed)
Clinical Social Work Assessment  Patient Details  Name: Cassidy Ellison MRN: [id number] Date of Birth: 03/31/1922  Date of referral:  05/22/15               Reason for consult:  Discharge Planning                Permission sought to share information with:  Family Supports Permission granted to share information::     Name::     Margot Chimes  Agency::     Relationship::  daughter  Contact Information:  [phone]  Housing/Transportation Living arrangements for the past 2 months:  [place] of Information:  Adult Children Patient Interpreter Needed:  None Criminal Activity/Legal Involvement Pertinent to Current Situation/Hospitalization:  No - Comment as needed Significant Relationships:  Adult Children Lives with:  Self Do you feel safe going back to the place where you live?  No Need for family participation in patient care:  Yes (Comment)  Care giving concerns:  Pt admitted from [place] and pt daughter concerned about pt returning to ILF and feels pt will need short term rehab for at least a week to get stronger.   Social Worker assessment / plan:  CSW received referral for New SNF.  PT recommending Home Health PT with 24 hour care and if 24 hour care unavailable recommend [facility] SNF.   Per chart, person oriented to person. CSW spoke with pt daughter, [name] via telephone. Pt daughter reports that pt lives alone at [place] and pt daughter concerned about pt safety at home as pt will not have 24 hour care. Pt daughter wants to explore short term rehab to get pt stronger before returning home. Pt daughter interested in [facility], but agreeable to [facility] search.  CSW completed FL2 and initiated SNF search to [facility].   CSW to follow up with pt daughter re: SNF bed offers.  CSW to continue to follow to provide support and assist with pt disposition needs.   Employment status:  Retired Engineer, technical sales PT Recommendations:  Home with [place], [place], [place] / Referral to community resources:  [place]  Patient/Family's Response to care:  Pt oriented to person only. Pt daughter feels best plan will be for pt to go to short term rehab for atleast one week before returning to [place].   Patient/Family's Understanding of and Emotional Response to Diagnosis, Current Treatment, and Prognosis:  Pt daughter displayed knowledge surrounding pt diagnosis and treatment plan.   Emotional Assessment Appearance:  Appears stated age Attitude/Demeanor/Rapport:   (pt oriented to person only) Affect (typically observed):  Unable to Assess Orientation:  Oriented to Self Alcohol / Substance use:  Not Applicable Psych involvement (Current and /or in the community):  No (Comment)  Discharge Needs  Concerns to be addressed:  Discharge Planning Concerns Readmission within the last 30 days:  No Current discharge risk:  Lives alone Barriers to Discharge:  Continued Medical Work up   Ladell Pier, [place] 05/22/2015, 4:46 PM  [phone]

## 2015-05-22 NOTE — Clinical Social Work Placement (Signed)
   CLINICAL SOCIAL WORK PLACEMENT  NOTE  Date:  05/22/2015  Patient Details  Name: Cassidy Ellison MRN: [id number] Date of Birth: 12-Sep-1921  Clinical Social Work is seeking post-discharge placement for this patient at the [place] level of care (*CSW will initial, date and re-position this form in  chart as items are completed):  Yes   Patient/family provided with [place] Work Department's list of facilities offering this level of care within the geographic area requested by the patient (or if unable, by the patient's family).  Yes   Patient/family informed of their freedom to choose among providers that offer the needed level of care, that participate in Medicare, Medicaid or managed care program needed by the patient, have an available bed and are willing to accept the patient.  Yes   Patient/family informed of [place]'s ownership interest in [facility] and [facility], as well as of the fact that they are under no obligation to receive care at these facilities.  PASRR submitted to EDS on 05/22/15     PASRR number received on 05/22/15     Existing PASRR number confirmed on       FL2 transmitted to all facilities in geographic area requested by pt/family on 05/22/15     FL2 transmitted to all facilities within larger geographic area on       Patient informed that his/her managed care company has contracts with or will negotiate with certain facilities, including the following:            Patient/family informed of bed offers received.  Patient chooses bed at       Physician recommends and patient chooses bed at      Patient to be transferred to   on  .  Patient to be transferred to facility by       Patient family notified on   of transfer.  Name of family member notified:        PHYSICIAN Please sign FL2     Additional Comment:    _______________________________________________ Ladell Pier, LCSW 05/22/2015, 5:14  PM

## 2015-05-22 NOTE — Progress Notes (Signed)
ANTIBIOTIC CONSULT NOTE - Follow-Up  Pharmacy Consult for Cefepime Indication: URI  Allergies  Allergen Reactions  . Adhesive [Tape] Itching  . Chlorthalidone Other (See Comments)    creatinine  increases  . Hctz [Hydrochlorothiazide] Other (See Comments)    CREATNINE INCREASES  . Keflex [Cephalexin]   . Oxsoralen [Methoxsalen]     Patient Measurements: Height: '5\' 5"'$  (165.1 cm) Weight: 135 lb 5.8 oz (61.4 kg) IBW/kg (Calculated) : 57  Vital Signs: Temp: 98 F (36.7 C) (01/12 0354) Temp Source: Oral (01/12 0354) BP: 115/56 mmHg (01/12 1039) Pulse Rate: 81 (01/12 1039) Intake/Output from previous day: 01/11 0701 - 01/12 0700 In: 300 [IV Piggyback:300] Out: 380 [Urine:380] Intake/Output from this shift: Total I/O In: 50 [IV Piggyback:50] Out: 250 [Urine:250]  Labs:  Recent Labs  05/20/15 0235 05/21/15 0538 05/22/15 0534  WBC 8.3 9.2  --   HGB 14.5 14.1  --   PLT 289 291  --   CREATININE 0.92 0.89 0.90   Estimated Creatinine Clearance: 36.6 mL/min (by C-G formula based on Cr of 0.9). No results for input(s): VANCOTROUGH, VANCOPEAK, VANCORANDOM, GENTTROUGH, GENTPEAK, GENTRANDOM, TOBRATROUGH, TOBRAPEAK, TOBRARND, AMIKACINPEAK, AMIKACINTROU, AMIKACIN in the last 72 hours.   Microbiology: Recent Results (from the past 720 hour(s))  Respiratory virus panel     Status: Abnormal   Collection Time: 05/15/15  9:59 PM  Result Value Ref Range Status   Respiratory Syncytial Virus A Negative Negative Final   Respiratory Syncytial Virus B Negative Negative Final   Influenza A Positive (A) Negative Final    Comment: Subtype: H3   Influenza B Negative Negative Final   Parainfluenza 1 Negative Negative Final   Parainfluenza 2 Negative Negative Final   Parainfluenza 3 Negative Negative Final   Metapneumovirus Negative Negative Final   Rhinovirus Negative Negative Final   Adenovirus Negative Negative Final    Comment: (NOTE) Performed At: [facility] [place], [place] [id number] Lindon Romp MD [id number]   C difficile quick scan w PCR reflex     Status: None   Collection Time: 05/17/15  2:26 PM  Result Value Ref Range Status   C Diff antigen NEGATIVE NEGATIVE Final   C Diff toxin NEGATIVE NEGATIVE Final   C Diff interpretation Negative for toxigenic C. difficile  Final  Culture, blood (routine x 2)     Status: None (Preliminary result)   Collection Time: 05/19/15  6:55 PM  Result Value Ref Range Status   Specimen Description BLOOD RIGHT ARM  Final   Special Requests BOTTLES DRAWN AEROBIC AND ANAEROBIC  10CC  Final   Culture   Final    NO GROWTH 3 DAYS Performed at [facility]    Report Status PENDING  Incomplete  Culture, blood (routine x 2)     Status: None (Preliminary result)   Collection Time: 05/19/15  7:06 PM  Result Value Ref Range Status   Specimen Description BLOOD LEFT ARM  Final   Special Requests BOTTLES DRAWN AEROBIC AND ANAEROBIC  [place]  Final   Culture   Final    NO GROWTH 3 DAYS Performed at [facility]    Report Status PENDING  Incomplete    Medical History: Past Medical History  Diagnosis Date  . Hypertension   . GERD (gastroesophageal reflux disease)   . Carpal tunnel syndrome     left  . DVT (deep venous thrombosis) ([place])   . RLS (restless legs syndrome)     Medications:  Anti-infectives    Start     Dose/Rate Route Frequency Ordered Stop   05/19/15 2000  vancomycin (VANCOCIN) IVPB 1000 mg/200 mL premix  Status:  Discontinued     1,000 mg 200 mL/hr over 60 Minutes Intravenous Every 24 hours 05/19/15 1756 05/22/15 1212   05/19/15 1830  ceFEPIme (MAXIPIME) 1 g in dextrose 5 % 50 mL IVPB     1 g 100 mL/hr over 30 Minutes Intravenous Every 12 hours 05/19/15 1756     05/17/15 1400  oseltamivir (TAMIFLU) capsule 30 mg     30 mg Oral 2 times daily 05/17/15 1202 05/21/15 2136   05/16/15 1700  doxycycline (VIBRA-TABS) tablet 100 mg  Status:  Discontinued     100 mg Oral  Every 12 hours 05/16/15 1617 05/17/15 1614   05/16/15 1630  piperacillin-tazobactam (ZOSYN) IVPB 3.375 g  Status:  Discontinued     3.375 g 12.5 mL/hr over 240 Minutes Intravenous Every 8 hours 05/16/15 1609 05/16/15 1625     Assessment: 82 y.o. female w/ PMH of emphysema, HTN, DVT, admitted 05/15/2015 for hyponatremia and weakness.  Found to be positive for influenza; treating with Tamiflu. Small area of ground glass opacification found on CT; not immediately perceived to be infectious. No changes in labs, vitals, or oxygenation status, but d/t increasing lethargy, weakness, and confusion, MD would like pharmacy to dose vancomycin and cefepime for r/o PNA.  1/6 >>doxy po  >> 1/7 1/7 >> Tamiflu >>  1/12 1/9 >> vancomycin >> 1/12 1/9 >> cefepime >>    1/9 blood: NGTD 1/7C diff: neg 1/5 Resp virus panel: Influenza A  Afebrile WBC: wnl Renal: SCr stable,  CrCl 36 CG  Goal of Therapy:  Eradication of infection Appropriate antibiotic dosing for indication and renal function  Plan:  Day 4 antibiotics Continue Cefepime 1 g IV q12 hr  Follow clinical course, renal function, culture results as available   Royetta Asal, PharmD, BCPS Pager [phone] 05/22/2015 3:25 PM

## 2015-05-22 NOTE — NC FL2 (Signed)
MEDICAID FL2 LEVEL OF CARE SCREENING TOOL     IDENTIFICATION  Patient Name: Cassidy Ellison Birthdate: 01-30-1922 Sex: female Admission Date (Current Location): 05/15/2015  [facility] and [place] Number:  Herbalist and Address:  [facility],  [place] [address] [place]      Provider Number: [id number]  Attending Physician Name and Address:  Caren Griffins, MD  Relative Name and Phone Number:       Current Level of Care: Hospital Recommended Level of Care: [place] Prior Approval Number:    Date Approved/Denied:   PASRR Number: [id number]  Discharge Plan: SNF    Current Diagnoses: Patient Active Problem List   Diagnosis Date Noted  . Influenza with pneumonia 05/17/2015  . Essential hypertension 05/15/2015  . GERD (gastroesophageal reflux disease) 05/15/2015  . Cough 05/15/2015  . Acute encephalopathy 05/15/2015  . RLS (restless legs syndrome)   . Hyponatremia   . Weakness   . Dehydration 05/11/2015  . UTI (lower urinary tract infection) 05/11/2015  . Acute bronchitis with bronchospasm 05/11/2015  . Hypertension 05/11/2015    Orientation RESPIRATION BLADDER Height & Weight    Self  Normal Continent '5\' 5"'$  (165.1 cm) 135 lbs.  BEHAVIORAL SYMPTOMS/MOOD NEUROLOGICAL BOWEL NUTRITION STATUS   (no behaviors)  (NONE) Continent Diet (Diet Regular)  AMBULATORY STATUS COMMUNICATION OF NEEDS Skin   Limited Assist Verbally Normal                       Personal Care Assistance Level of Assistance  Bathing, Feeding, Dressing Bathing Assistance: Limited assistance Feeding assistance: Independent Dressing Assistance: Limited assistance     Functional Limitations Info  Sight, Hearing, Speech Sight Info: Adequate Hearing Info: Adequate Speech Info: Adequate    SPECIAL CARE FACTORS FREQUENCY  PT (By licensed PT), OT (By licensed OT)     PT Frequency: 5 x a week OT Frequency: 5 x a week             Contractures Contractures Info: Not present    Additional Factors Info  Code Status, Allergies, Isolation Precautions Code Status Info: FULL code status Allergies Info: Adhesive, Chlorthalidone, Hctz, Keflex, Oxsoralen     Isolation Precautions Info: Droplet precaution: Influenza with pneumonia- pt has completed Tamiflu and no fevers and RN looking into if droplet precautions will be discontinued.      Current Medications (05/22/2015):  This is the current hospital active medication list Current Facility-Administered Medications  Medication Dose Route Frequency Provider Last Rate Last Dose  . acetaminophen (TYLENOL) tablet 650 mg  650 mg Oral Q6H PRN Ivor Costa, MD       Or  . acetaminophen (TYLENOL) suppository 650 mg  650 mg Rectal Q6H PRN Ivor Costa, MD      . albuterol (PROVENTIL) (2.5 MG/3ML) 0.083% nebulizer solution 2.5 mg  2.5 mg Nebulization Q3H PRN Ivor Costa, MD      . amLODipine (NORVASC) tablet 10 mg  10 mg Oral Daily Ivor Costa, MD   10 mg at 05/22/15 1040  . aspirin chewable tablet 81 mg  81 mg Oral Daily Ivor Costa, MD   81 mg at 05/22/15 1043  . calcium-vitamin D (OSCAL WITH D) 500-200 MG-UNIT per tablet 1 tablet  1 tablet Oral Daily Ivor Costa, MD   1 tablet at 05/22/15 1042  . ceFEPIme (MAXIPIME) 1 g in dextrose 5 % 50 mL IVPB  1 g Intravenous Q12H Polly Cobia, [facility]  1 g at 05/22/15 1046  . cholecalciferol (VITAMIN D) tablet 1,000 Units  1,000 Units Oral Daily Ivor Costa, MD   1,000 Units at 05/22/15 1044  . dronabinol (MARINOL) capsule 2.5 mg  2.5 mg Oral Daily Simbiso Ranga, MD   2.5 mg at 05/22/15 1040  . feeding supplement (ENSURE ENLIVE) (ENSURE ENLIVE) liquid 237 mL  237 mL Oral BID BM Fleet Contras, MD   237 mL at 05/22/15 1258  . heparin injection 5,000 Units  5,000 Units Subcutaneous 3 times per day Ivor Costa, MD   5,000 Units at 05/22/15 0518  . metoprolol succinate (TOPROL-XL) 24 hr tablet 100 mg  100 mg Oral Daily Ivor Costa, MD   100 mg at 05/22/15 1044   . multivitamin with minerals tablet 1 tablet  1 tablet Oral Daily Ivor Costa, MD   1 tablet at 05/22/15 1040  . omega-3 acid ethyl esters (LOVAZA) capsule 2 g  2 g Oral Daily Ivor Costa, MD   2 g at 05/22/15 1045  . ondansetron (ZOFRAN) tablet 4 mg  4 mg Oral Q6H PRN Ivor Costa, MD       Or  . ondansetron [facility]) injection 4 mg  4 mg Intravenous Q6H PRN Ivor Costa, MD   4 mg at 05/16/15 1900  . pantoprazole (PROTONIX) EC tablet 40 mg  40 mg Oral Daily Ivor Costa, MD   40 mg at 05/22/15 1045  . sodium chloride 0.9 % injection 3 mL  3 mL Intravenous Q12H Ivor Costa, MD   3 mL at 05/22/15 1039  . sodium chloride tablet 2 g  2 g Oral TID WC Costin Karlyne Greenspan, MD      . triamcinolone cream (KENALOG) 0.1 % 1 application  1 application Topical BID PRN Ivor Costa, MD      . vitamin C (ASCORBIC ACID) tablet 500 mg  500 mg Oral Daily Ivor Costa, MD   500 mg at 05/22/15 1044     Discharge Medications: Please see discharge summary for a list of discharge medications.  Relevant Imaging Results:  Relevant Lab Results:   Additional Information SSN; [id number]  Danell Vazquez, [place], LCSW

## 2015-05-23 LAB — BASIC METABOLIC PANEL
ANION GAP: 8 (ref 5–15)
BUN: 26 mg/dL — ABNORMAL HIGH (ref 6–20)
CALCIUM: 9.2 mg/dL (ref 8.9–10.3)
CO2: 24 mmol/L (ref 22–32)
Chloride: 92 mmol/L — ABNORMAL LOW (ref 101–111)
Creatinine, Ser: 0.95 mg/dL (ref 0.44–1.00)
GFR calc Af Amer: 59 mL/min — ABNORMAL LOW (ref >60)
GFR calc non Af Amer: 51 mL/min — ABNORMAL LOW (ref >60)
GLUCOSE: 104 mg/dL — AB (ref 65–99)
POTASSIUM: 4.6 mmol/L (ref 3.5–5.1)
Sodium: 124 mmol/L — ABNORMAL LOW (ref 135–145)

## 2015-05-23 LAB — SODIUM: Sodium: 125 mmol/L — ABNORMAL LOW (ref 135–145)

## 2015-05-23 MED ORDER — SENNOSIDES-DOCUSATE SODIUM 8.6-50 MG PO TABS
2.0000 | ORAL_TABLET | Freq: Every day | ORAL | Status: DC
  Administered 2015-05-23: 2 via ORAL
  Filled 2015-05-23: qty 2

## 2015-05-23 NOTE — Progress Notes (Addendum)
[facility] Progress Note   Subjective: serum Na 125 yest and 124 today.  Salt tabs started yesterday , now at 2 gm tid.   Filed Vitals:   05/22/15 1530 05/22/15 2148 05/23/15 0518 05/23/15 1512  BP:  114/63 136/74 108/72  Pulse: 67 66 73 77  Temp: 98.2 F (36.8 C) 97.7 F (36.5 C) 97.8 F (36.6 C) 97.2 F (36.2 C)  TempSrc: Oral Oral Oral Oral  Resp: '18 18 18 18  '$ Height:      Weight:      SpO2: 97% 96% 92% 93%    Inpatient medications: . amLODipine  10 mg Oral Daily  . aspirin  81 mg Oral Daily  . calcium-vitamin D  1 tablet Oral Daily  . cholecalciferol  1,000 Units Oral Daily  . dronabinol  2.5 mg Oral Daily  . feeding supplement (ENSURE ENLIVE)  237 mL Oral BID BM  . heparin  5,000 Units Subcutaneous 3 times per day  . metoprolol  100 mg Oral Daily  . multivitamin with minerals  1 tablet Oral Daily  . omega-3 acid ethyl esters  2 g Oral Daily  . pantoprazole  40 mg Oral Daily  . senna-docusate  2 tablet Oral QHS  . sodium chloride  3 mL Intravenous Q12H  . sodium chloride  2 g Oral TID WC  . vitamin C  500 mg Oral Daily     acetaminophen **OR** acetaminophen, albuterol, ondansetron **OR** ondansetron (ZOFRAN) IV, triamcinolone cream  Exam: Awake but tired and a bit confused No jvd Chest clear bilat RRR no mrg Abd soft no ascites Ext no edema  Neuro Ox 2  Uosm - 283 UNa 36 CT abd ess negative     Assessment: 1 Hyponatremia - sp tolvaptan. Low solute and/or SIADH. Would continue salt tabs, poss Na is levelling off now. Will check this evening as well. Fluids restriction is mainstay of therapy and I stressed this to the patient and her family, the less she drinks the quicker she will get out of hospital.  2 Flu A 3 AMS 4 Gen weakness 5 HTN on norvasc  Plan - as above   Kelly Splinter MD [facility] pager [phone]    cell [phone] 05/23/2015, 4:12 PM    Recent Labs Lab 05/17/15 0612 05/18/15 0548 05/19/15 0513   05/21/15 0538 05/22/15 0534 05/23/15 0450  NA 116* 120* 118*  < > 128* 125* 124*  K 3.0* 4.8 3.9  < > 4.4 4.3 4.6  CL 81* 88* 85*  < > 90* 88* 92*  CO2 '25 24 27  '$ < > '28 28 24  '$ GLUCOSE 112* 98 107*  < > 107* 105* 104*  BUN '9 15 17  '$ < > 20 26* 26*  CREATININE 0.61 0.71 0.81  < > 0.89 0.90 0.95  CALCIUM 8.5* 8.7* 8.9  < > 9.6 9.3 9.2  PHOS 1.9* 2.0* 2.7  --   --   --   --   < > = values in this interval not displayed.  Recent Labs Lab 05/19/15 0513 05/20/15 0235 05/21/15 0538  AST '17 21 21  '$ ALT '17 23 25  '$ ALKPHOS 62 65 64  BILITOT 0.8 0.8 1.1  PROT 6.5 7.1 6.9  ALBUMIN 3.4*  3.4* 3.8 3.6    Recent Labs Lab 05/17/15 0612  05/19/15 0513 05/20/15 0235 05/21/15 0538  WBC 5.7  < > 6.9 8.3 9.2  NEUTROABS 3.4  --   --  5.5  --  HGB 13.0  < > 12.9 14.5 14.1  HCT 35.2*  < > 37.1 40.2 40.4  MCV 77.2*  < > 80.3 79.8 81.0  PLT 207  < > 239 289 291  < > = values in this interval not displayed.

## 2015-05-23 NOTE — Progress Notes (Signed)
CSW continuing to follow.   CSW followed up with pt daughter, [name] via telephone to provide SNF bed offers.   Pt daughter first choice will be [facility] and second choice is [facility]. CSW explained to pt daughter that SNF will be dependent on availability as pt not yet medically ready for discharge. Pt daughter expressed understanding. CSW provided update to pt at bedside and left SNF list at bedside.   CSW notified [facility] and [facility] of interest and facilities stated for weekend CSW to contact facilities over the weekend if pt medically ready in order to inquire about availability.  CSW to continue to follow to provide support and assist with pt disposition needs.   Alison Murray, MSW, [place] [facility] [phone]

## 2015-05-23 NOTE — Progress Notes (Signed)
PROGRESS NOTE  Cassidy Ellison GUY:[id number] DOB: March 11, 1922 DOA: 05/15/2015 PCP: Antony Blackbird, MD  HPI: 82 y.o. female with PMH of hypertension, GERD, DVT, carpel tunnel syndrome, RLS, who presents with generalized weakness, nausea, poor oral intake, and mild confusion, admitted on 1/5.   Subjective / 24 H Interval events - confused this morning, doesn't know where she is. At baseline she is driving per her daughter.   Assessment/Plan: Principal Problem:   Hyponatremia Active Problems:   Acute bronchitis with bronchospasm   Hypertension   Weakness   Essential hypertension   GERD (gastroesophageal reflux disease)   Cough   RLS (restless legs syndrome)   Acute encephalopathy   Influenza with pneumonia   Hyponatremia - Na 112 on admission, nephrology consulted, appreciate help, she was on Tolvaptan x 1 dose - slowly improved up to 128 2 days ago, now trending back down 125 >> 124 - TSH/cortisol unremarkable - discussed today with Dr. Jonnie Finner, appreciate input. Salt tabs started yesterday  Acute encephalopathy / intermittent confusion - MRI brain negative, TSH unremarkable - has been on prednisone, ?related to that, tapered off and discontinued - improved  Acute bronchitis / URI - possible wheezing on admission - Influenza A positive, patient on Tamiflu, finished a course - due to progressive confusion patient started on broad spectrum antibiotics Vanc/Cefepime on 1/9 - d/c antibiotics today   Right upper lung small area of ground glass opacification - pulmonary consulted, appreciate input - follow up CT chest 3-6 months  Weakness - PT consult, OT consult. Recommending SNF. SW consult  HTN - continue Norvasc, Metoprolol - good control today @ 136/74, no changes in her regimen    Diet: Diet regular Room service appropriate?: Yes; Fluid consistency:: Thin; Fluid restriction:: 1200 mL Fluid Fluids: none  DVT Prophylaxis: heparin  Code Status: Full Code Family  Communication: d/w daughter over the phone  Disposition Plan: SNF when ready   Barriers to discharge: worsening hyponatremia  Consultants:  Nephrology   Procedures:  2D echo Study Conclusions - Left ventricle: The cavity size was normal. Systolic function wasnormal. The estimated ejection fraction was in the range of 60% to 65%. Wall motion was normal; there were no regional wallmotion abnormalities. Left ventricular diastolic functionparameters were normal. Atrial septum: No defect or patent foramen ovale was identified.   Antibiotics Doxycycline 05/16/2015> 05/17/2015 Tamiflu 05/17/2015 > 1/11 Vanc 05/19/15 >> 1/11 Cefepime 05/19/15 >> 1/13   Studies  No results found.  Objective  Filed Vitals:   05/22/15 1039 05/22/15 1530 05/22/15 2148 05/23/15 0518  BP: 115/56  114/63 136/74  Pulse: 81 67 66 73  Temp:  98.2 F (36.8 C) 97.7 F (36.5 C) 97.8 F (36.6 C)  TempSrc:  Oral Oral Oral  Resp:  '18 18 18  '$ Height:      Weight:      SpO2:  97% 96% 92%    Intake/Output Summary (Last 24 hours) at 05/23/15 1056 Last data filed at 05/23/15 0855  Gross per 24 hour  Intake    480 ml  Output   1800 ml  Net  -1320 ml   Filed Weights   05/15/15 2035  Weight: 61.4 kg (135 lb 5.8 oz)    Exam:  GENERAL: NAD  HEENT: no scleral icterus, PERRL  NECK: supple, no LAD  LUNGS: CTA biL, no wheezing  HEART: RRR without MRG  ABDOMEN: soft, non tender  EXTREMITIES: no clubbing / cyanosis  NEUROLOGIC: non focal   Data Reviewed: Basic Metabolic  Panel:  Recent Labs Lab 05/17/15 0612 05/18/15 0548 05/19/15 0513  05/20/15 0235 05/20/15 1035 05/21/15 0538 05/22/15 0534 05/23/15 0450  NA 116* 120* 118*  < > 124* 127* 128* 125* 124*  K 3.0* 4.8 3.9  --  4.1  --  4.4 4.3 4.6  CL 81* 88* 85*  --  88*  --  90* 88* 92*  CO2 '25 24 27  '$ --  27  --  '28 28 24  '$ GLUCOSE 112* 98 107*  --  109*  --  107* 105* 104*  BUN '9 15 17  '$ --  18  --  20 26* 26*  CREATININE 0.61 0.71  0.81  --  0.92  --  0.89 0.90 0.95  CALCIUM 8.5* 8.7* 8.9  --  9.6  --  9.6 9.3 9.2  MG 1.7 2.1  --   --   --   --   --   --   --   PHOS 1.9* 2.0* 2.7  --   --   --   --   --   --   < > = values in this interval not displayed. Liver Function Tests:  Recent Labs Lab 05/17/15 0612 05/19/15 0513 05/20/15 0235 05/21/15 0538  AST '24 17 21 21  '$ ALT '18 17 23 25  '$ ALKPHOS 62 62 65 64  BILITOT 1.1 0.8 0.8 1.1  PROT 6.1* 6.5 7.1 6.9  ALBUMIN 3.4* 3.4*  3.4* 3.8 3.6   CBC:  Recent Labs Lab 05/17/15 0612 05/18/15 0548 05/19/15 0513 05/20/15 0235 05/21/15 0538  WBC 5.7 5.5 6.9 8.3 9.2  NEUTROABS 3.4  --   --  5.5  --   HGB 13.0 12.7 12.9 14.5 14.1  HCT 35.2* 35.7* 37.1 40.2 40.4  MCV 77.2* 79.7 80.3 79.8 81.0  PLT 207 225 239 289 291   Cardiac Enzymes:  Recent Labs Lab 05/19/15 1855  CKTOTAL 31*    Recent Results (from the past 240 hour(s))  Respiratory virus panel     Status: Abnormal   Collection Time: 05/15/15  9:59 PM  Result Value Ref Range Status   Respiratory Syncytial Virus A Negative Negative Final   Respiratory Syncytial Virus B Negative Negative Final   Influenza A Positive (A) Negative Final    Comment: Subtype: H3   Influenza B Negative Negative Final   Parainfluenza 1 Negative Negative Final   Parainfluenza 2 Negative Negative Final   Parainfluenza 3 Negative Negative Final   Metapneumovirus Negative Negative Final   Rhinovirus Negative Negative Final   Adenovirus Negative Negative Final    Comment: (NOTE) Performed At: [facility] [place], [place] [id number] Lindon Romp MD [id number]   C difficile quick scan w PCR reflex     Status: None   Collection Time: 05/17/15  2:26 PM  Result Value Ref Range Status   C Diff antigen NEGATIVE NEGATIVE Final   C Diff toxin NEGATIVE NEGATIVE Final   C Diff interpretation Negative for toxigenic C. difficile  Final  Culture, blood (routine x 2)     Status: None (Preliminary result)    Collection Time: 05/19/15  6:55 PM  Result Value Ref Range Status   Specimen Description BLOOD RIGHT ARM  Final   Special Requests BOTTLES DRAWN AEROBIC AND ANAEROBIC  10CC  Final   Culture   Final    NO GROWTH 3 DAYS Performed at [facility]    Report Status PENDING  Incomplete  Culture, blood (  routine x 2)     Status: None (Preliminary result)   Collection Time: 05/19/15  7:06 PM  Result Value Ref Range Status   Specimen Description BLOOD LEFT ARM  Final   Special Requests BOTTLES DRAWN AEROBIC AND ANAEROBIC  [place]  Final   Culture   Final    NO GROWTH 3 DAYS Performed at [facility]    Report Status PENDING  Incomplete     Scheduled Meds: . amLODipine  10 mg Oral Daily  . aspirin  81 mg Oral Daily  . calcium-vitamin D  1 tablet Oral Daily  . ceFEPime (MAXIPIME) IV  1 g Intravenous Q12H  . cholecalciferol  1,000 Units Oral Daily  . dronabinol  2.5 mg Oral Daily  . feeding supplement (ENSURE ENLIVE)  237 mL Oral BID BM  . heparin  5,000 Units Subcutaneous 3 times per day  . metoprolol  100 mg Oral Daily  . multivitamin with minerals  1 tablet Oral Daily  . omega-3 acid ethyl esters  2 g Oral Daily  . pantoprazole  40 mg Oral Daily  . sodium chloride  3 mL Intravenous Q12H  . sodium chloride  2 g Oral TID WC  . vitamin C  500 mg Oral Daily   Continuous Infusions:   Marzetta Board, MD [facility] Pager [phone]. If 7 PM - 7 AM, please contact night-coverage at [email or web address], password [email or web address] 05/23/2015, 10:56 AM  LOS: 8 days

## 2015-05-23 NOTE — [place] FL2 (Signed)
[place] MEDICAID FL2 LEVEL OF CARE SCREENING TOOL     IDENTIFICATION  Patient Name: Cassidy Ellison Birthdate: 1921/11/09 Sex: female Admission Date (Current Location): 05/15/2015  [facility] and [place] Number:  Herbalist and Address:  [facility],  [place] [address], [place]      Provider Number: [id number]  Attending Physician Name and Address:  Caren Griffins, MD  Relative Name and Phone Number:       Current Level of Care: Hospital Recommended Level of Care: [place] Prior Approval Number:    Date Approved/Denied:   PASRR Number: [id number]  Discharge Plan: SNF    Current Diagnoses: Patient Active Problem List   Diagnosis Date Noted  . Influenza with pneumonia 05/17/2015  . Essential hypertension 05/15/2015  . GERD (gastroesophageal reflux disease) 05/15/2015  . Cough 05/15/2015  . Acute encephalopathy 05/15/2015  . RLS (restless legs syndrome)   . Hyponatremia   . Weakness   . Dehydration 05/11/2015  . UTI (lower urinary tract infection) 05/11/2015  . Acute bronchitis with bronchospasm 05/11/2015  . Hypertension 05/11/2015    Orientation RESPIRATION BLADDER Height & Weight    Self, Time, Situation, Place  Normal Continent '5\' 5"'$  (165.1 cm) 135 lbs.  BEHAVIORAL SYMPTOMS/MOOD NEUROLOGICAL BOWEL NUTRITION STATUS   (no behaviorws)  (NONE) Continent Diet (Diet Regular)  AMBULATORY STATUS COMMUNICATION OF NEEDS Skin   Limited Assist Verbally Normal                       Personal Care Assistance Level of Assistance  Bathing, Feeding, Dressing Bathing Assistance: Limited assistance Feeding assistance: Independent Dressing Assistance: Limited assistance     Functional Limitations Info  Sight, Hearing, Speech Sight Info: Adequate Hearing Info: Adequate Speech Info: Adequate    SPECIAL CARE FACTORS FREQUENCY  PT (By licensed PT), OT (By licensed OT)     PT Frequency: 5 x a week OT Frequency: 5 x a  week            Contractures Contractures Info: Not present    Additional Factors Info  Isolation Precautions Code Status Info: FULL Code status Allergies Info: Adhesive, Chlorthalidone, Hctz, Keflex, Oxsoralen     Isolation Precautions Info: NO DROPLET PRECAUTIONS IN PLACE AT THIS TIME-DROPLET PRECAUTIONS WERE DISCONTINUED     Current Medications (05/23/2015):  This is the current hospital active medication list Current Facility-Administered Medications  Medication Dose Route Frequency Provider Last Rate Last Dose  . acetaminophen (TYLENOL) tablet 650 mg  650 mg Oral Q6H PRN Ivor Costa, MD       Or  . acetaminophen (TYLENOL) suppository 650 mg  650 mg Rectal Q6H PRN Ivor Costa, MD      . albuterol (PROVENTIL) (2.5 MG/3ML) 0.083% nebulizer solution 2.5 mg  2.5 mg Nebulization Q3H PRN Ivor Costa, MD      . amLODipine (NORVASC) tablet 10 mg  10 mg Oral Daily Ivor Costa, MD   10 mg at 05/23/15 0937  . aspirin chewable tablet 81 mg  81 mg Oral Daily Ivor Costa, MD   81 mg at 05/23/15 0936  . calcium-vitamin D (OSCAL WITH D) 500-200 MG-UNIT per tablet 1 tablet  1 tablet Oral Daily Ivor Costa, MD   1 tablet at 05/23/15 0936  . ceFEPIme (MAXIPIME) 1 g in dextrose 5 % 50 mL IVPB  1 g Intravenous Q12H Polly Cobia, RPH   1 g at 05/23/15 0933  . cholecalciferol (VITAMIN D)  tablet 1,000 Units  1,000 Units Oral Daily Ivor Costa, MD   1,000 Units at 05/23/15 [phone]  . dronabinol (MARINOL) capsule 2.5 mg  2.5 mg Oral Daily Simbiso Ranga, MD   2.5 mg at 05/23/15 0938  . feeding supplement (ENSURE ENLIVE) (ENSURE ENLIVE) liquid 237 mL  237 mL Oral BID BM Fleet Contras, MD   237 mL at 05/22/15 1454  . heparin injection 5,000 Units  5,000 Units Subcutaneous 3 times per day Ivor Costa, MD   5,000 Units at 05/23/15 0518  . metoprolol succinate (TOPROL-XL) 24 hr tablet 100 mg  100 mg Oral Daily Ivor Costa, MD   100 mg at 05/23/15 0936  . multivitamin with minerals tablet 1 tablet  1 tablet Oral Daily Ivor Costa, MD   1 tablet at 05/23/15 0936  . omega-3 acid ethyl esters (LOVAZA) capsule 2 g  2 g Oral Daily Ivor Costa, MD   2 g at 05/23/15 [phone]  . ondansetron (ZOFRAN) tablet 4 mg  4 mg Oral Q6H PRN Ivor Costa, MD       Or  . ondansetron [facility]) injection 4 mg  4 mg Intravenous Q6H PRN Ivor Costa, MD   4 mg at 05/16/15 1900  . pantoprazole (PROTONIX) EC tablet 40 mg  40 mg Oral Daily Ivor Costa, MD   40 mg at 05/23/15 3709  . sodium chloride 0.9 % injection 3 mL  3 mL Intravenous Q12H Ivor Costa, MD   3 mL at 05/22/15 2200  . sodium chloride tablet 2 g  2 g Oral TID WC Caren Griffins, MD   2 g at 05/23/15 6438  . triamcinolone cream (KENALOG) 0.1 % 1 application  1 application Topical BID PRN Ivor Costa, MD      . vitamin C (ASCORBIC ACID) tablet 500 mg  500 mg Oral Daily Ivor Costa, MD   500 mg at 05/23/15 3818     Discharge Medications: Please see discharge summary for a list of discharge medications.  Relevant Imaging Results:  Relevant Lab Results:   Additional Information SSN: [id number]  Rilynne Lonsway A, LCSW

## 2015-05-24 DIAGNOSIS — J1008 Influenza due to other identified influenza virus with other specified pneumonia: Secondary | ICD-10-CM | POA: Diagnosis not present

## 2015-05-24 DIAGNOSIS — I10 Essential (primary) hypertension: Secondary | ICD-10-CM | POA: Diagnosis not present

## 2015-05-24 DIAGNOSIS — J209 Acute bronchitis, unspecified: Secondary | ICD-10-CM | POA: Diagnosis not present

## 2015-05-24 DIAGNOSIS — E871 Hypo-osmolality and hyponatremia: Secondary | ICD-10-CM | POA: Diagnosis not present

## 2015-05-24 DIAGNOSIS — E222 Syndrome of inappropriate secretion of antidiuretic hormone: Secondary | ICD-10-CM | POA: Diagnosis not present

## 2015-05-24 DIAGNOSIS — E876 Hypokalemia: Secondary | ICD-10-CM | POA: Diagnosis not present

## 2015-05-24 DIAGNOSIS — R1310 Dysphagia, unspecified: Secondary | ICD-10-CM | POA: Diagnosis not present

## 2015-05-24 DIAGNOSIS — G9340 Encephalopathy, unspecified: Secondary | ICD-10-CM | POA: Diagnosis not present

## 2015-05-24 DIAGNOSIS — G9349 Other encephalopathy: Secondary | ICD-10-CM | POA: Diagnosis not present

## 2015-05-24 DIAGNOSIS — R2681 Unsteadiness on feet: Secondary | ICD-10-CM | POA: Diagnosis not present

## 2015-05-24 DIAGNOSIS — R29898 Other symptoms and signs involving the musculoskeletal system: Secondary | ICD-10-CM | POA: Diagnosis not present

## 2015-05-24 DIAGNOSIS — R531 Weakness: Secondary | ICD-10-CM | POA: Diagnosis not present

## 2015-05-24 DIAGNOSIS — M6281 Muscle weakness (generalized): Secondary | ICD-10-CM | POA: Diagnosis not present

## 2015-05-24 DIAGNOSIS — K219 Gastro-esophageal reflux disease without esophagitis: Secondary | ICD-10-CM | POA: Diagnosis not present

## 2015-05-24 DIAGNOSIS — J1100 Influenza due to unidentified influenza virus with unspecified type of pneumonia: Secondary | ICD-10-CM | POA: Diagnosis not present

## 2015-05-24 DIAGNOSIS — G2581 Restless legs syndrome: Secondary | ICD-10-CM | POA: Diagnosis not present

## 2015-05-24 LAB — CULTURE, BLOOD (ROUTINE X 2)
CULTURE: NO GROWTH
Culture: NO GROWTH

## 2015-05-24 LAB — BASIC METABOLIC PANEL
ANION GAP: 10 (ref 5–15)
BUN: 25 mg/dL — AB (ref 6–20)
CHLORIDE: 96 mmol/L — AB (ref 101–111)
CO2: 24 mmol/L (ref 22–32)
Calcium: 9 mg/dL (ref 8.9–10.3)
Creatinine, Ser: 0.86 mg/dL (ref 0.44–1.00)
GFR calc Af Amer: >60 mL/min (ref >60)
GFR calc non Af Amer: 57 mL/min — ABNORMAL LOW (ref >60)
GLUCOSE: 100 mg/dL — AB (ref 65–99)
POTASSIUM: 4.2 mmol/L (ref 3.5–5.1)
Sodium: 130 mmol/L — ABNORMAL LOW (ref 135–145)

## 2015-05-24 MED ORDER — SODIUM CHLORIDE 1 G PO TABS: 2.0000 g | ORAL_TABLET | Freq: Two times a day (BID) | ORAL | Status: DC

## 2015-05-24 NOTE — Discharge Summary (Signed)
Physician Discharge Summary  Cassidy Ellison FTD:[id number] DOB: [birth year] DOA: 05/15/2015  PCP: Antony Blackbird, MD  Admit date: 05/15/2015 Discharge date: 05/24/2015  Time spent: > 30 minutes  Recommendations for Outpatient Follow-up:  1. Follow up with Dr. Chapman Fitch in 1 week 2. Recommend fluid restriction 1200 cc / day 3. Repeat sodium levels in 3-4 days   Discharge Diagnoses:  Principal Problem:   Hyponatremia Active Problems:   Acute bronchitis with bronchospasm   Hypertension   Weakness   Essential hypertension   GERD (gastroesophageal reflux disease)   Cough   RLS (restless legs syndrome)   Acute encephalopathy   Influenza with pneumonia  Discharge Condition: stable  Diet recommendation: regular  Filed Weights   05/15/15 2035 05/24/15 0508  Weight: 61.4 kg (135 lb 5.8 oz) 60.8 kg (134 lb 0.6 oz)    History of present illness:  See H&P, Labs, Consult and Test reports for all details in brief, patient is a 82 y.o. female with PMH of hypertension, GERD, DVT, carpel tunnel syndrome, RLS, who presents with generalized weakness, nausea, poor oral intake, and mild confusion, admitted on 1/5.   Hospital Course:  Hyponatremia - Na 112 on admission, nephrology consulted, appreciate help, looks like this is multifactorial due to low solute intake and mild SIADH, with fluid restriction and Na tabs her sodium improved, 130 on discharge. Recommend recheck Na in 3-4 days after discharge at [facility]. TSH / cortisol unremarkable Acute encephalopathy / intermittent confusion - MRI brain negative, TSH unremarkable, has been on prednisone for wheezing, ?related to that, tapered off and discontinued, now mental status improved.  Acute bronchitis / URI - possible wheezing on admission - Influenza A positive, patient on Tamiflu, finished a course while hospitalized. Due to progressive confusion patient started on broad spectrum antibiotics Vanc/Cefepime and finished a course as well. She has been doing  well of antibiotics, afebrile Right upper lung small area of ground glass opacification - pulmonary consulted, appreciate input, follow up CT chest 3-6 months Weakness - SNF on discharge HTN - continue home regimen  Procedures:  None    Consultations:  Nephrology   Discharge Exam: Filed Vitals:   05/23/15 0518 05/23/15 1512 05/23/15 2110 05/24/15 0508  BP: 136/74 108/72 136/58 122/69  Pulse: 73 77 77 77  Temp: 97.8 F (36.6 C) 97.2 F (36.2 C) 97.9 F (36.6 C) 98.7 F (37.1 C)  TempSrc: Oral Oral Oral Oral  Resp: '18 18 18 19  '$ Height:      Weight:    60.8 kg (134 lb 0.6 oz)  SpO2: 92% 93% 98% 99%   General: NAD Cardiovascular: RRR Respiratory: CTA biL  Discharge Instructions Activity:  As tolerated   Get Medicines reviewed and adjusted: Please take all your medications with you for your next visit with your Primary MD  Please request your Primary MD to go over all hospital tests and procedure/radiological results at the follow up, please ask your Primary MD to get all Hospital records sent to his/her office.  If you experience worsening of your admission symptoms, develop shortness of breath, life threatening emergency, suicidal or homicidal thoughts you must seek medical attention immediately by calling 911 or calling your MD immediately if symptoms less severe.  You must read complete instructions/literature along with all the possible adverse reactions/side effects for all the Medicines you take and that have been prescribed to you. Take any new Medicines after you have completely understood and accpet all the possible adverse reactions/side effects.  Do not drive when taking Pain medications.   Do not take more than prescribed Pain, Sleep and Anxiety Medications  Special Instructions: If you have smoked or chewed Tobacco in the last 2 yrs please stop smoking, stop any regular Alcohol and or any Recreational drug use.  Wear Seat belts while  driving.  Please note  You were cared for by a hospitalist during your hospital stay. Once you are discharged, your primary care physician will handle any further medical issues. Please note that NO REFILLS for any discharge medications will be authorized once you are discharged, as it is imperative that you return to your primary care physician (or establish a relationship with a primary care physician if you do not have one) for your aftercare needs so that they can reassess your need for medications and monitor your lab values.    Medication List    STOP taking these medications        levofloxacin 250 MG tablet  Commonly known as:  LEVAQUIN      TAKE these medications        amLODipine 10 MG tablet  Commonly known as:  NORVASC  Take 10 mg by mouth daily.     aspirin 81 MG tablet  Take 81 mg by mouth daily.     CALCIUM 1200 PO  Take 1,200 mg by mouth daily.     Dextromethorphan-Guaifenesin 5-100 MG/5ML Liqd  Take 5 mLs by mouth every 4 (four) hours as needed (cough).     fish oil-omega-3 fatty acids 1000 MG capsule  Take 2,000 mg by mouth daily.     ITCHY EYE DROPS OP  Apply 2 drops to eye 2 (two) times daily as needed (itching eye).     losartan 100 MG tablet  Commonly known as:  COZAAR  Take 100 mg by mouth daily.     omeprazole 20 MG capsule  Commonly known as:  PRILOSEC  Take 20 mg by mouth daily.     ondansetron 4 MG disintegrating tablet  Commonly known as:  ZOFRAN ODT  Take 1 tablet (4 mg total) by mouth every 8 (eight) hours as needed for nausea or vomiting.     ONE DAILY MULTIPLE VITAMIN PO  Take by mouth.     polyethylene glycol packet  Commonly known as:  MIRALAX / GLYCOLAX  Take 17 g by mouth daily as needed for mild constipation.     RED YEAST RICE EXTRACT PO  Take 1,200 mg by mouth daily.     rOPINIRole 0.25 MG tablet  Commonly known as:  REQUIP  Take 1 tablet (0.25 mg total) by mouth at bedtime.     sodium chloride 1 g tablet  Take 2  tablets (2 g total) by mouth 2 (two) times daily with a meal.     TOPROL XL 200 MG 24 hr tablet  Generic drug:  metoprolol  Take 100 mg by mouth daily.     triamcinolone cream 0.1 %  Commonly known as:  KENALOG  Apply 1 application topically 2 (two) times daily as needed (itch).     vitamin C 500 MG tablet  Commonly known as:  ASCORBIC ACID  Take 500 mg by mouth daily.     Vitamin D-3 1000 units Caps  Take 1,000 Units by mouth daily.           Follow-up Information    Follow up with FULP, CAMMIE, MD. Schedule an appointment as soon as possible for a visit in  1 week.   Specialty:  Family Medicine   Contact information:   [address] [place] [address] [phone]       The results of significant diagnostics from this hospitalization (including imaging, microbiology, ancillary and laboratory) are listed below for reference.    Significant Diagnostic Studies: Dg Chest 2 View  05/15/2015  CLINICAL DATA:  Increasing weakness, confusion and anorexia for 5 days, diarrhea, and productive cough for 1 day, recent admission for dehydration, hyponatremia, and UTI, history hypertension, former smoker EXAM: CHEST  2 VIEW COMPARISON:  05/11/2015 FINDINGS: Upper normal heart size. Calcified tortuous aorta. Mediastinal contours and pulmonary vascularity normal. Lungs mildly hyperinflated but clear. No pleural effusion or pneumothorax. Bones demineralized. IMPRESSION: Mild hyperinflation without acute infiltrate. Electronically Signed   By: Lavonia Dana M.D.   On: 05/15/2015 18:00   Dg Chest 2 View  05/11/2015  CLINICAL DATA:  Wheezing. EXAM: CHEST  2 VIEW COMPARISON:  08/22/2007 FINDINGS: Heart size and pulmonary vascularity are normal. No infiltrates or effusions. The lungs are slightly hyperinflated. Diffuse degenerative changes in the thoracic spine. IMPRESSION: No active cardiopulmonary disease. Electronically Signed   By: Lorriane Shire M.D.   On: 05/11/2015 16:22   Ct Angio Chest  Pe W/cm &/or Wo Cm  05/15/2015  CLINICAL DATA:  Increasing weakness, confusion and anorexia for 5 days, diarrhea and productive cough beginning today, recent dehydration, hyponatremia and UTI, hypertension, question pulmonary embolism EXAM: CT ANGIOGRAPHY CHEST WITH CONTRAST TECHNIQUE: Multidetector CT imaging of the chest was performed using the standard protocol during bolus administration of intravenous contrast. Multiplanar CT image reconstructions and MIPs were obtained to evaluate the vascular anatomy. CONTRAST:  124m OMNIPAQUE IOHEXOL 350 MG/ML SOLN IV COMPARISON:  None. FINDINGS: Scattered atherosclerotic calcifications aorta, coronary arteries and proximal great vessels. Aorta normal caliber without aneurysm or dissection. Minimal pericardial effusion. Pulmonary arteries well opacified and patent. No evidence of pulmonary embolism. No thoracic adenopathy. Slight thickening in the adrenal glands without discrete mass. Remaining visualized upper abdomen unremarkable. Bones demineralized with scattered degenerative disc disease changes of thoracic spine. Peripheral interstitial thickening question fibrosis in RIGHT lower lobe. Central peribronchial thickening and minimal upper lobe emphysematous changes. Small focus of ground-glass opacity in the RIGHT upper lobe 10 mm diameter image 16. 2 mm RIGHT upper lobe subpleural nodule image 27. Remaining lungs clear. No pleural effusion or pneumothorax. Review of the MIP images confirms the above findings. IMPRESSION: No evidence of pulmonary embolism. Scattered atherosclerotic disease. Minimal pericardial fluid. Bronchitic and minimal emphysematous changes. Small focus of ground-glass opacity in RIGHT upper lobe 10 mm diameter ; initial follow-up by chest CT without contrast is recommended in 3 months to confirm persistence, if clinically indicated. This recommendation follows the consensus statement: Recommendations for the Management of Subsolid Pulmonary Nodules  Detected at CT: A Statement from the [place] published in Radiology 2013; 266:304-317. Electronically Signed   By: MLavonia DanaM.D.   On: 05/15/2015 19:55   Mr BJeri CosWBJContrast  05/16/2015  CLINICAL DATA:  Hyponatremia. Increasing confusion and weakness. Anorexia. Diarrhea and productive cough. EXAM: MRI HEAD WITHOUT AND WITH CONTRAST TECHNIQUE: Multiplanar, multiecho pulse sequences of the brain and surrounding structures were obtained without and with intravenous contrast. CONTRAST:  168mMULTIHANCE GADOBENATE DIMEGLUMINE 529 MG/ML IV SOLN COMPARISON:  None. FINDINGS: There is no evidence of acute infarct, intracranial hemorrhage, mass, midline shift, or extra-axial fluid collection. There is moderate cerebral atrophy. Small foci of T2 hyperintensity scattered throughout the cerebral  white matter bilaterally are nonspecific but compatible with mild chronic small vessel ischemic disease. Minimal T2 hyperintensity in the pons likely reflects chronic small vessel ischemia, without evidence of osmotic demyelination. No abnormal enhancement is identified. Prior bilateral cataract extraction is noted. No significant inflammatory disease is seen in the paranasal sinuses or mastoid air cells. The Major intracranial vascular flow voids are preserved. IMPRESSION: 1. No acute intracranial abnormality. 2. Mild chronic small vessel ischemic disease and moderate cerebral atrophy. Electronically Signed   By: Logan Bores M.D.   On: 05/16/2015 15:27   Ct Abdomen Pelvis W Contrast  05/16/2015  CLINICAL DATA:  Hospitalized January 1st through May 13, 2015 for urinary tract infection and viral gastritis, persistent hyponatremia EXAM: CT ABDOMEN AND PELVIS WITH CONTRAST TECHNIQUE: Multidetector CT imaging of the abdomen and pelvis was performed using the standard protocol following bolus administration of intravenous contrast. CONTRAST:  120m OMNIPAQUE IOHEXOL 300 MG/ML  SOLN COMPARISON:  None. FINDINGS:  Lower chest:  No acute findings Hepatobiliary: Status post cholecystectomy. Mild intrahepatic biliary dilatation and mild to moderate extrahepatic biliary dilatation likely related to prior cholecystectomy. Pancreas: Negative Spleen: Negative Adrenals/Urinary Tract: 19 mm right adrenal nodule. 13 mm left adrenal nodule. Calcification of inferior aspect of left adrenal gland. Sub cm low-attenuation round and oval lesions involving both kidneys too small to characterize, most consistent with cysts. Minimal perinephric fluid/ inflammation on the left possibly related to pyelonephritis as stated in clinical indication. No hydronephrosis. Bladder contains relatively hyper attenuating fluid, likely residual excreted contrast from May 15, 2015 CT thorax examination. Stomach/Bowel: Stomach appear normal. Small bowel appears normal. There is moderate diverticulosis of the sigmoid colon. There is trace fluid in the inferior pelvis in the region of the sigmoid colon but there is no evidence of inflammatory change. There is mild diverticulosis of the descending colon as well. Vascular/Lymphatic: Moderate atherosclerotic aortoiliac calcification Reproductive: Reproductive organs not identified presumably surgically absent. Other: As stated above, trace free fluid in the dependent inferior pelvis. Musculoskeletal: No acute musculoskeletal findings IMPRESSION: No specific acute findings. Minimal perinephric fluid or inflammation on the left could reflect recent pyelonephritis. Mild biliary dilatation likely related to status post cholecystectomy. Sigmoid diverticulosis without specific evidence of diverticulitis. Trace free fluid in the inferior pelvis is nonspecific. Electronically Signed   By: RSkipper ClicheM.D.   On: 05/16/2015 18:45    Microbiology: Recent Results (from the past 240 hour(s))  Respiratory virus panel     Status: Abnormal   Collection Time: 05/15/15  9:59 PM  Result Value Ref Range Status    Respiratory Syncytial Virus A Negative Negative Final   Respiratory Syncytial Virus B Negative Negative Final   Influenza A Positive (A) Negative Final    Comment: Subtype: H3   Influenza B Negative Negative Final   Parainfluenza 1 Negative Negative Final   Parainfluenza 2 Negative Negative Final   Parainfluenza 3 Negative Negative Final   Metapneumovirus Negative Negative Final   Rhinovirus Negative Negative Final   Adenovirus Negative Negative Final    Comment: (NOTE) Performed At[id number] RompMD PXL:[id number]  C difficile quick scan w PCR reflex     Status: None   Collection Time: 05/17/15  2:26 PM  Result Value Ref Range Status   C Diff antigen NEGATIVE NEGATIVE Final   C Diff toxin NEGATIVE NEGATIVE Final   C Diff interpretation Negative for toxigenic C. difficile  Final  Culture, blood (routine  x 2)     Status: None (Preliminary result)   Collection Time: 05/19/15  6:55 PM  Result Value Ref Range Status   Specimen Description BLOOD RIGHT ARM  Final   Special Requests BOTTLES DRAWN AEROBIC AND ANAEROBIC  10CC  Final   Culture   Final    NO GROWTH 4 DAYS Performed at [facility]    Report Status PENDING  Incomplete  Culture, blood (routine x 2)     Status: None (Preliminary result)   Collection Time: 05/19/15  7:06 PM  Result Value Ref Range Status   Specimen Description BLOOD LEFT ARM  Final   Special Requests BOTTLES DRAWN AEROBIC AND ANAEROBIC  [place]  Final   Culture   Final    NO GROWTH 4 DAYS Performed at [facility]    Report Status PENDING  Incomplete     Labs: Basic Metabolic Panel:  Recent Labs Lab 05/18/15 0548 05/19/15 0513 05/19/15 1855 05/20/15 0235 05/20/15 1035 05/21/15 0538 05/22/15 0534 05/23/15 0450 05/23/15 1630 05/24/15 0553  NA 120* 118* 112* 124* 127* 128* 125* 124* 125* 130*  K 4.8 3.9  --  4.1  --  4.4 4.3 4.6  --  4.2  CL 88* 85*  --  88*  --   90* 88* 92*  --  96*  CO2 24 27  --  27  --  '28 28 24  '$ --  24  GLUCOSE 98 107*  --  109*  --  107* 105* 104*  --  100*  BUN 15 17  --  18  --  20 26* 26*  --  25*  CREATININE 0.71 0.81  --  0.92  --  0.89 0.90 0.95  --  0.86  CALCIUM 8.7* 8.9  --  9.6  --  9.6 9.3 9.2  --  9.0  MG 2.1  --   --   --   --   --   --   --   --   --   PHOS 2.0* 2.7  --   --   --   --   --   --   --   --    Liver Function Tests:  Recent Labs Lab 05/19/15 0513 05/20/15 0235 05/21/15 0538  AST '17 21 21  '$ ALT '17 23 25  '$ ALKPHOS 62 65 64  BILITOT 0.8 0.8 1.1  PROT 6.5 7.1 6.9  ALBUMIN 3.4*  3.4* 3.8 3.6   CBC:  Recent Labs Lab 05/18/15 0548 05/19/15 0513 05/20/15 0235 05/21/15 0538  WBC 5.5 6.9 8.3 9.2  NEUTROABS  --   --  5.5  --   HGB 12.7 12.9 14.5 14.1  HCT 35.7* 37.1 40.2 40.4  MCV 79.7 80.3 79.8 81.0  PLT 225 239 289 291   Cardiac Enzymes:  Recent Labs Lab 05/19/15 1855  CKTOTAL 31*    Signed:  Marzetta Board MD [facility] 05/24/2015, 9:07 AM

## 2015-05-24 NOTE — Discharge Instructions (Signed)
Follow with FULP, CAMMIE, MD in 5-7 days  Please get a complete blood count and chemistry panel checked by your Primary MD at your next visit, and again as instructed by your Primary MD. Please get your medications reviewed and adjusted by your Primary MD.  Please request your Primary MD to go over all Hospital Tests and Procedure/Radiological results at the follow up, please get all Hospital records sent to your Prim MD by signing hospital release before you go home.  If you had Pneumonia of Lung problems at the Hospital: Please get a 2 view Chest X ray done in 6-8 weeks after hospital discharge or sooner if instructed by your Primary MD.  If you have Congestive Heart Failure: Please call your Cardiologist or Primary MD anytime you have any of the following symptoms:  1) 3 pound weight gain in 24 hours or 5 pounds in 1 week  2) shortness of breath, with or without a dry hacking cough  3) swelling in the hands, feet or stomach  4) if you have to sleep on extra pillows at night in order to breathe  Follow cardiac low salt diet and 1.5 lit/day fluid restriction.  If you have diabetes Accuchecks 4 times/day, Once in AM empty stomach and then before each meal. Log in all results and show them to your primary doctor at your next visit. If any glucose reading is under 80 or above 300 call your primary MD immediately.  If you have Seizure/Convulsions/Epilepsy: Please do not drive, operate heavy machinery, participate in activities at heights or participate in high speed sports until you have seen by Primary MD or a Neurologist and advised to do so again.  If you had Gastrointestinal Bleeding: Please ask your Primary MD to check a complete blood count within one week of discharge or at your next visit. Your endoscopic/colonoscopic biopsies that are pending at the time of discharge, will also need to followed by your Primary MD.  Get Medicines reviewed and adjusted. Please take all your  medications with you for your next visit with your Primary MD  Please request your Primary MD to go over all hospital tests and procedure/radiological results at the follow up, please ask your Primary MD to get all Hospital records sent to his/her office.  If you experience worsening of your admission symptoms, develop shortness of breath, life threatening emergency, suicidal or homicidal thoughts you must seek medical attention immediately by calling 911 or calling your MD immediately  if symptoms less severe.  You must read complete instructions/literature along with all the possible adverse reactions/side effects for all the Medicines you take and that have been prescribed to you. Take any new Medicines after you have completely understood and accpet all the possible adverse reactions/side effects.   Do not drive or operate heavy machinery when taking Pain medications.   Do not take more than prescribed Pain, Sleep and Anxiety Medications  Special Instructions: If you have smoked or chewed Tobacco  in the last 2 yrs please stop smoking, stop any regular Alcohol  and or any Recreational drug use.  Wear Seat belts while driving.  Please note You were cared for by a hospitalist during your hospital stay. If you have any questions about your discharge medications or the care you received while you were in the hospital after you are discharged, you can call the unit and asked to speak with the hospitalist on call if the hospitalist that took care of you is not available. Once  you are discharged, your primary care physician will handle any further medical issues. Please note that NO REFILLS for any discharge medications will be authorized once you are discharged, as it is imperative that you return to your primary care physician (or establish a relationship with a primary care physician if you do not have one) for your aftercare needs so that they can reassess your need for medications and monitor your  lab values.  You can reach the hospitalist office at phone [phone] or fax [phone]   If you do not have a primary care physician, you can call [phone] for a physician referral.  Activity: As tolerated with Full fall precautions use walker/cane & assistance as needed  Diet: regular with 1200 cc fluid restriction  Disposition SNF

## 2015-05-24 NOTE — Progress Notes (Signed)
Patient for d/c today to SNF bed at [facility]- Family and pateint  agreeable to this plan- pleased with location and private room-  plan transfer via EMS. Eduard Clos, MSW, [place]

## 2015-05-24 NOTE — Progress Notes (Signed)
Called report to [facility] and talked with michelle lpn

## 2015-05-25 DIAGNOSIS — R531 Weakness: Secondary | ICD-10-CM | POA: Diagnosis not present

## 2015-05-25 DIAGNOSIS — E222 Syndrome of inappropriate secretion of antidiuretic hormone: Secondary | ICD-10-CM | POA: Diagnosis not present

## 2015-05-25 DIAGNOSIS — J209 Acute bronchitis, unspecified: Secondary | ICD-10-CM | POA: Diagnosis not present

## 2015-05-25 DIAGNOSIS — I10 Essential (primary) hypertension: Secondary | ICD-10-CM | POA: Diagnosis not present

## 2015-05-25 DIAGNOSIS — G2581 Restless legs syndrome: Secondary | ICD-10-CM | POA: Diagnosis not present

## 2015-06-01 DIAGNOSIS — E876 Hypokalemia: Secondary | ICD-10-CM | POA: Diagnosis not present

## 2015-06-01 DIAGNOSIS — E222 Syndrome of inappropriate secretion of antidiuretic hormone: Secondary | ICD-10-CM | POA: Diagnosis not present

## 2015-06-10 DIAGNOSIS — I10 Essential (primary) hypertension: Secondary | ICD-10-CM | POA: Diagnosis not present

## 2015-06-10 DIAGNOSIS — R2681 Unsteadiness on feet: Secondary | ICD-10-CM | POA: Diagnosis not present

## 2015-06-10 DIAGNOSIS — R531 Weakness: Secondary | ICD-10-CM | POA: Diagnosis not present

## 2015-06-10 DIAGNOSIS — G2581 Restless legs syndrome: Secondary | ICD-10-CM | POA: Diagnosis not present

## 2015-06-10 DIAGNOSIS — Z87440 Personal history of urinary (tract) infections: Secondary | ICD-10-CM | POA: Diagnosis not present

## 2015-06-10 DIAGNOSIS — Z8701 Personal history of pneumonia (recurrent): Secondary | ICD-10-CM | POA: Diagnosis not present

## 2015-06-13 ENCOUNTER — Emergency Department (HOSPITAL_COMMUNITY): Payer: Medicare Other

## 2015-06-13 ENCOUNTER — Encounter (HOSPITAL_COMMUNITY): Payer: Self-pay | Admitting: Emergency Medicine

## 2015-06-13 ENCOUNTER — Inpatient Hospital Stay (HOSPITAL_COMMUNITY)
Admission: EM | Admit: 2015-06-13 | Discharge: 2015-06-14 | DRG: 069 | Disposition: A | Discharge status: Nursing Home (Includes ICF) | Payer: Medicare Other | Attending: Internal Medicine | Admitting: Internal Medicine

## 2015-06-13 DIAGNOSIS — G459 Transient cerebral ischemic attack, unspecified: Principal | ICD-10-CM | POA: Diagnosis present

## 2015-06-13 DIAGNOSIS — Z86718 Personal history of other venous thrombosis and embolism: Secondary | ICD-10-CM

## 2015-06-13 DIAGNOSIS — I10 Essential (primary) hypertension: Secondary | ICD-10-CM | POA: Diagnosis not present

## 2015-06-13 DIAGNOSIS — Z79899 Other long term (current) drug therapy: Secondary | ICD-10-CM | POA: Diagnosis not present

## 2015-06-13 DIAGNOSIS — K219 Gastro-esophageal reflux disease without esophagitis: Secondary | ICD-10-CM | POA: Diagnosis present

## 2015-06-13 DIAGNOSIS — G2581 Restless legs syndrome: Secondary | ICD-10-CM | POA: Diagnosis not present

## 2015-06-13 DIAGNOSIS — R4182 Altered mental status, unspecified: Secondary | ICD-10-CM | POA: Diagnosis present

## 2015-06-13 DIAGNOSIS — Z7982 Long term (current) use of aspirin: Secondary | ICD-10-CM | POA: Diagnosis not present

## 2015-06-13 DIAGNOSIS — Z66 Do not resuscitate: Secondary | ICD-10-CM | POA: Diagnosis present

## 2015-06-13 DIAGNOSIS — G43109 Migraine with aura, not intractable, without status migrainosus: Secondary | ICD-10-CM | POA: Diagnosis present

## 2015-06-13 DIAGNOSIS — Z8701 Personal history of pneumonia (recurrent): Secondary | ICD-10-CM | POA: Diagnosis not present

## 2015-06-13 DIAGNOSIS — G9340 Encephalopathy, unspecified: Secondary | ICD-10-CM | POA: Diagnosis not present

## 2015-06-13 DIAGNOSIS — R05 Cough: Secondary | ICD-10-CM | POA: Diagnosis not present

## 2015-06-13 DIAGNOSIS — Z87891 Personal history of nicotine dependence: Secondary | ICD-10-CM | POA: Diagnosis not present

## 2015-06-13 DIAGNOSIS — E785 Hyperlipidemia, unspecified: Secondary | ICD-10-CM | POA: Diagnosis not present

## 2015-06-13 DIAGNOSIS — Z87440 Personal history of urinary (tract) infections: Secondary | ICD-10-CM | POA: Diagnosis not present

## 2015-06-13 DIAGNOSIS — R410 Disorientation, unspecified: Secondary | ICD-10-CM | POA: Diagnosis not present

## 2015-06-13 DIAGNOSIS — I639 Cerebral infarction, unspecified: Secondary | ICD-10-CM | POA: Insufficient documentation

## 2015-06-13 DIAGNOSIS — R531 Weakness: Secondary | ICD-10-CM | POA: Diagnosis not present

## 2015-06-13 DIAGNOSIS — R2681 Unsteadiness on feet: Secondary | ICD-10-CM | POA: Diagnosis not present

## 2015-06-13 LAB — I-STAT CHEM 8, ED
BUN: 19 mg/dL (ref 6–20)
CALCIUM ION: 1.25 mmol/L (ref 1.13–1.30)
CHLORIDE: 101 mmol/L (ref 101–111)
Creatinine, Ser: 0.7 mg/dL (ref 0.44–1.00)
Glucose, Bld: 102 mg/dL — ABNORMAL HIGH (ref 65–99)
HEMATOCRIT: 38 % (ref 36.0–46.0)
Hemoglobin: 12.9 g/dL (ref 12.0–15.0)
POTASSIUM: 4.1 mmol/L (ref 3.5–5.1)
SODIUM: 135 mmol/L (ref 135–145)
TCO2: 24 mmol/L (ref 0–100)

## 2015-06-13 LAB — URINALYSIS, ROUTINE W REFLEX MICROSCOPIC
BILIRUBIN URINE: NEGATIVE
Glucose, UA: NEGATIVE mg/dL
Hgb urine dipstick: NEGATIVE
Ketones, ur: NEGATIVE mg/dL
LEUKOCYTES UA: NEGATIVE
NITRITE: NEGATIVE
Protein, ur: NEGATIVE mg/dL
SPECIFIC GRAVITY, URINE: 1.007 (ref 1.005–1.030)
pH: 6.5 (ref 5.0–8.0)

## 2015-06-13 LAB — COMPREHENSIVE METABOLIC PANEL
ALBUMIN: 3.7 g/dL (ref 3.5–5.0)
ALT: 18 U/L (ref 14–54)
ANION GAP: 9 (ref 5–15)
AST: 21 U/L (ref 15–41)
Alkaline Phosphatase: 64 U/L (ref 38–126)
BUN: 16 mg/dL (ref 6–20)
CHLORIDE: 101 mmol/L (ref 101–111)
CO2: 24 mmol/L (ref 22–32)
Calcium: 9.8 mg/dL (ref 8.9–10.3)
Creatinine, Ser: 0.98 mg/dL (ref 0.44–1.00)
GFR calc Af Amer: 57 mL/min — ABNORMAL LOW (ref >60)
GFR calc non Af Amer: 49 mL/min — ABNORMAL LOW (ref >60)
GLUCOSE: 107 mg/dL — AB (ref 65–99)
POTASSIUM: 4.2 mmol/L (ref 3.5–5.1)
SODIUM: 134 mmol/L — AB (ref 135–145)
Total Bilirubin: 0.6 mg/dL (ref 0.3–1.2)
Total Protein: 6.9 g/dL (ref 6.5–8.1)

## 2015-06-13 LAB — CBC
HCT: 35.5 % — ABNORMAL LOW (ref 36.0–46.0)
HEMOGLOBIN: 12.1 g/dL (ref 12.0–15.0)
MCH: 28.3 pg (ref 26.0–34.0)
MCHC: 34.1 g/dL (ref 30.0–36.0)
MCV: 83.1 fL (ref 78.0–100.0)
PLATELETS: 251 10*3/uL (ref 150–400)
RBC: 4.27 MIL/uL (ref 3.87–5.11)
RDW: 13.5 % (ref 11.5–15.5)
WBC: 5.8 10*3/uL (ref 4.0–10.5)

## 2015-06-13 LAB — DIFFERENTIAL
BASOS ABS: 0 10*3/uL (ref 0.0–0.1)
Basophils Relative: 0 %
EOS ABS: 0.4 10*3/uL (ref 0.0–0.7)
EOS PCT: 7 %
LYMPHS PCT: 27 %
Lymphs Abs: 1.5 10*3/uL (ref 0.7–4.0)
Monocytes Absolute: 0.6 10*3/uL (ref 0.1–1.0)
Monocytes Relative: 10 %
NEUTROS PCT: 56 %
Neutro Abs: 3.2 10*3/uL (ref 1.7–7.7)

## 2015-06-13 LAB — PROTIME-INR
INR: 1 (ref 0.00–1.49)
PROTHROMBIN TIME: 13.4 s (ref 11.6–15.2)

## 2015-06-13 LAB — I-STAT TROPONIN, ED: Troponin i, poc: 0.01 ng/mL (ref 0.00–0.08)

## 2015-06-13 LAB — CBG MONITORING, ED: GLUCOSE-CAPILLARY: 95 mg/dL (ref 65–99)

## 2015-06-13 LAB — APTT: APTT: 24 s (ref 24–37)

## 2015-06-13 MED ORDER — BISACODYL 5 MG PO TBEC: 5.0000 mg | DELAYED_RELEASE_TABLET | Freq: Every day | ORAL | Status: DC | PRN

## 2015-06-13 MED ORDER — ACETAMINOPHEN 325 MG PO TABS: 650.0000 mg | ORAL_TABLET | ORAL | Status: DC | PRN

## 2015-06-13 MED ORDER — ROPINIROLE HCL 0.25 MG PO TABS
0.2500 mg | ORAL_TABLET | Freq: Every day | ORAL | Status: DC
  Administered 2015-06-14: 0.25 mg via ORAL
  Filled 2015-06-13 (×2): qty 1

## 2015-06-13 MED ORDER — PANTOPRAZOLE SODIUM 40 MG PO TBEC
40.0000 mg | DELAYED_RELEASE_TABLET | Freq: Every day | ORAL | Status: DC
  Administered 2015-06-14: 40 mg via ORAL
  Filled 2015-06-13: qty 1

## 2015-06-13 MED ORDER — POLYETHYLENE GLYCOL 3350 17 G PO PACK: 17.0000 g | PACK | Freq: Every day | ORAL | Status: DC | PRN

## 2015-06-13 MED ORDER — SODIUM CHLORIDE 0.9 % IV SOLN
INTRAVENOUS | Status: DC
  Administered 2015-06-14: via INTRAVENOUS

## 2015-06-13 MED ORDER — ASPIRIN 300 MG RE SUPP: 300.0000 mg | Freq: Every day | RECTAL | Status: DC

## 2015-06-13 MED ORDER — ASPIRIN 325 MG PO TABS
325.0000 mg | ORAL_TABLET | Freq: Every day | ORAL | Status: DC
  Administered 2015-06-14: 325 mg via ORAL
  Filled 2015-06-13: qty 1

## 2015-06-13 MED ORDER — STROKE: EARLY STAGES OF RECOVERY BOOK
Freq: Once | Status: AC
  Administered 2015-06-14

## 2015-06-13 MED ORDER — METOPROLOL SUCCINATE ER 100 MG PO TB24
100.0000 mg | ORAL_TABLET | Freq: Every day | ORAL | Status: DC
  Administered 2015-06-14: 100 mg via ORAL
  Filled 2015-06-13: qty 1

## 2015-06-13 MED ORDER — ACETAMINOPHEN 650 MG RE SUPP: 650.0000 mg | RECTAL | Status: DC | PRN

## 2015-06-13 NOTE — Progress Notes (Signed)
[facility] hospitalist progress note. Chief complaint. Transfer note. History of present illness. This 82 year old female presented to [facility] long emergency room with sudden onset of confusion while reading. Patient was transferred to [facility] long emergency room and her symptoms resolved after approximately 30 minutes. Case was discussed with neurology who recommended the patient be admitted and transferred to [facility] for TIA workup. Patient is now arrived in transfer and I'm seeing her at bedside to ensure she remains clinically stable and that her orders have transferred here appropriately. Patient has no current medical complaints. Physical exam. Vital signs. Temperature 97.7, pulse 75, respiration 18, blood pressure 135/55. O2 sats 97%. General appearance. Well-developed elderly female who is alert and in no distress. Cardiac. Rate and rhythm regular. Lungs. Breath sounds clear and equal. Abdomen. Soft with positive bowel sounds. Neurologic. Cranial nerves II-12 grossly intact. No unilateral or focal defects. Strength 5/5 and equal in all 4 extremities. Impression/plan. Problem #1. TIA. Patient admitted based on TIA/CVA protocol waiting MRI/MRA. Also ordered carotid Dopplers and echocardiogram. Neurology has already consulted on this patient. Problem #2. Essential hypertension. Hold all home medications except Toprol with permissive hypertension. Problem #3. Acute encephalopathy. Resolved suspect possible TIA. Patient appears clinically stable post transfer. All orders appear to of transferred here appropriately.

## 2015-06-13 NOTE — ED Notes (Signed)
[facility] here to transfer pt to [facility]. 

## 2015-06-13 NOTE — Progress Notes (Signed)
Arrived from [facility]. Pt alert and oriented. Denies any pain. Ambulated to BR with 1 Assist. Safety measure in placed. Oriented to room with verbalized understanding.

## 2015-06-13 NOTE — ED Notes (Signed)
Bed: YM41 Expected date:  Expected time:  Means of arrival:  Comments: ? TIA

## 2015-06-13 NOTE — ED Notes (Signed)
Per family pt onset of confusion while reading; symptoms resolved at present time. Pt a/o x4 and denies pain.

## 2015-06-13 NOTE — Consult Note (Addendum)
Neurology Consultation Reason for Consult: Transient confusion Referring Physician: Thomasene Lot, C  CC: Transient confusion  History is obtained from: Patient, daughter  HPI: Cassidy Ellison is a 82 y.o. female who was in her normal state of health earlier today when she had sudden onset confusion. She was looking at her nook and using it appropriately, then she had sudden onset difficulty using it and difficulty following some written directions. She states that she was looking at the directions, it appeared as if someone for missing when she knew they were written down there. She also had difficulty with completing simple tasks. She was finally able to send a message to her daughter to come over, and on arrival the daughter states that she was confused but already had improved considerably compared to what the patient was describing prior to her arrival. She advised her mother to come into the emergency room and by the time they got to the emergency room she was acting almost her normal self.   LKW: Currently well tpa given?: no, resolve symptoms   ROS: A 14 point ROS was performed and is negative except as noted in the HPI.    Past Medical History  Diagnosis Date  . Hypertension   . GERD (gastroesophageal reflux disease)   . Carpal tunnel syndrome     left  . DVT (deep venous thrombosis) ([place])   . RLS (restless legs syndrome)      Family history: No history of stroke  Social History:  reports that she has quit smoking. She does not have any smokeless tobacco history on file. She reports that she does not drink alcohol or use illicit drugs.   Exam: Current vital signs: BP 105/69 mmHg  Pulse 76  Temp(Src) 98.2 F (36.8 C) (Oral)  Resp 16  Ht '5\' 5"'$  (1.651 m)  Wt 58.514 kg (129 lb)  BMI 21.47 kg/m2  SpO2 96% Vital signs in last 24 hours: Temp:  [98.2 F (36.8 C)] 98.2 F (36.8 C) (02/03 1659) Pulse Rate:  [73-82] 76 (02/03 1806) Resp:  [16-18] 16 (02/03 1806) BP:  (105-156)/(64-69) 105/69 mmHg (02/03 1806) SpO2:  [96 %-98 %] 96 % (02/03 1806) Weight:  [58.514 kg (129 lb)] 58.514 kg (129 lb) (02/03 1618)   Physical Exam  Constitutional: Appears well-developed and well-nourished.  Psych: Affect appropriate to situation Eyes: No scleral injection HENT: No OP obstrucion Head: Normocephalic.  Cardiovascular: Normal rate and regular rhythm.  Respiratory: Effort normal and breath sounds normal to anterior ascultation GI: Soft.  No distension. There is no tenderness.  Skin: WDI  Neuro: Mental Status: Patient is awake, alert, oriented to person, place, month, year, and situation. Patient is able to give a clear and coherent history. No signs of aphasia or neglect Cranial Nerves: II: Visual Fields are full. Pupils are equal, round, and reactive to light.   III,IV, VI: EOMI without ptosis or diploplia.  V: Facial sensation is symmetric to temperature VII: Facial movement is symmetric.  VIII: hearing is intact to voice X: Uvula elevates symmetrically XI: Shoulder shrug is symmetric. XII: tongue is midline without atrophy or fasciculations.  Motor: Tone is normal. Bulk is normal. 5/5 strength was present in all four extremities.  Sensory: Sensation is symmetric to light touch in the arms and legs. Cerebellar: FNF  intact bilaterally         I have reviewed labs in epic and the results pertinent to this consultation are: CMP-unremarkable  I have reviewed the images obtained: CT  head-atrophy  Impression: 82 year old female with transient episode of confusion. The description of not seeing something that was there is no concern for partial field cut during the episode. I wonder about a nondominant posterior TIA. She did not have any loss of consciousness or loss time which I do think makes seizure less likely, though EEG I do feel is appropriate as well.  Recommendations: 1. HgbA1c, fasting lipid panel 2. MRI, MRA  of the brain without  contrast 3. Frequent neuro checks 4. Echocardiogram 5. Carotid dopplers 6. Prophylactic therapy-Antiplatelet med: Aspirin - dose '325mg'$  PO or '300mg'$  PR 7. Risk factor modification 8. Telemetry monitoring 9. PT consult, OT consult, Speech consult 10. EEG 11. please page stroke NP  Or  PA  Or MD  M-F from 8am -4 pm starting 06/13/15 as this patient will be followed by the stroke team at this point.   You can look them up on [email or web address]  Password [email or web address]    Roland Rack, MD [facility] [phone]  If 7pm- 7am, please page neurology on call as listed in [place].

## 2015-06-13 NOTE — ED Notes (Signed)
[facility] was notified of pt's transfer to [facility].

## 2015-06-13 NOTE — H&P (Signed)
PCP:  Antony Blackbird, MD    Referring provider [place]   Chief Complaint:  confusion  HPI: Cassidy Ellison is a 82 y.o. female   has a past medical history of Hypertension; GERD (gastroesophageal reflux disease); Carpal tunnel syndrome; DVT (deep venous thrombosis) (HCC); and RLS (restless legs syndrome).   Presented with sudden onset of confusion while she was reading. Patient currently resides at [facility] independent facility after recent discharge. She was in her room sometime after lunch she suddenly became very confused and was unable to do things  that she usually able to such as use her phone she had difficulty understanding where she was. Patient states she remembers being a bit confused she was trying to set up her "electronic device" at that time.  She was trying to text her family but her thoghts appeared confused.   Daughter is a Marine scientist at [employer] was able to assess her and stated there was no slurred speech at the time. NO weakness on one side, no trouble walking.   Her symptoms resolved 30 min after daughter has arrived  EMS was called patient was transferred to [facility] emergency department.  IN ER: Neurology consult has been called Dr. Katherine Roan have seen the patient and recommends admission to [facility] for TIA workup. UA negative for UTI. CT unremarkable. CXR COPD but not acute findings   Regarding pertinent past history: Patient is been recently admitted for hyponatremia and was discharged on January 14 At that time her sodium was down to 112 on admission today his sodium is him up to 135 during her admission MRI was done which was unremarkable TSH was unremarkable at that time she did test positive for influenza and was titrated to Tamiflu due to generalized weakness she was discharged to nursing home [facility] for 2 weeks and no back to independent living at [facility].   Hospitalist was called for admission for TIA workup  Review of Systems:    Pertinent positives  include: Confusion  Constitutional:  No weight loss, night sweats, Fevers, chills, fatigue, weight loss  HEENT:  No headaches, Difficulty swallowing,Tooth/dental problems,Sore throat,  No sneezing, itching, ear ache, nasal congestion, post nasal drip,  Cardio-vascular:  No chest pain, Orthopnea, PND, anasarca, dizziness, palpitations.no Bilateral lower extremity swelling  GI:  No heartburn, indigestion, abdominal pain, nausea, vomiting, diarrhea, change in bowel habits, loss of appetite, melena, blood in stool, hematemesis Resp:  no shortness of breath at rest. No dyspnea on exertion, No excess mucus, no productive cough, No non-productive cough, No coughing up of blood.No change in color of mucus.No wheezing. Skin:  no rash or lesions. No jaundice GU:  no dysuria, change in color of urine, no urgency or frequency. No straining to urinate.  No flank pain.  Musculoskeletal:  No joint pain or no joint swelling. No decreased range of motion. No back pain.  Psych:  No change in mood or affect. No depression or anxiety. No memory loss.  Neuro: no localizing neurological complaints, no tingling, no weakness, no double vision, no gait abnormality, no slurred speech,   Otherwise ROS are negative except for above, 10 systems were reviewed  Past Medical History: Past Medical History  Diagnosis Date  . Hypertension   . GERD (gastroesophageal reflux disease)   . Carpal tunnel syndrome     left  . DVT (deep venous thrombosis) ([place])   . RLS (restless legs syndrome)    Past Surgical History  Procedure Laterality Date  .  Abdominal hysterectomy    . Gallbladder surgery    . Carpal tunnel release Left 10/31/2014    Procedure: LEFT CARPAL TUNNEL RELEASE;  Surgeon: Leanora Cover, MD;  Location: [place];  Service: Orthopedics;  Laterality: Left;     Medications: Prior to Admission medications   Medication Sig Start Date End Date Taking? Authorizing Provider  amLODipine  (NORVASC) 10 MG tablet Take 10 mg by mouth daily.   Yes Historical Provider, MD  aspirin 81 MG tablet Take 81 mg by mouth daily.   Yes Historical Provider, MD  bisacodyl (DULCOLAX) 5 MG EC tablet Take 5-15 mg by mouth daily as needed for moderate constipation.   Yes Historical Provider, MD  Calcium Carbonate-Vit D-Min (CALCIUM 1200 PO) Take 1,200 mg by mouth daily.   Yes Historical Provider, MD  Cholecalciferol (VITAMIN D-3) 1000 units CAPS Take 1,000 Units by mouth daily.   Yes Historical Provider, MD  Dextromethorphan-Guaifenesin 5-100 MG/5ML LIQD Take 5 mLs by mouth every 4 (four) hours as needed (cough).   Yes Historical Provider, MD  fish oil-omega-3 fatty acids 1000 MG capsule Take 2,000 mg by mouth daily.   Yes Historical Provider, MD  losartan (COZAAR) 100 MG tablet Take 100 mg by mouth daily.   Yes Historical Provider, MD  naproxen sodium (ANAPROX) 220 MG tablet Take 440 mg by mouth daily as needed (pain).   Yes Historical Provider, MD  omeprazole (PRILOSEC) 20 MG capsule Take 20 mg by mouth daily.   Yes Historical Provider, MD  ondansetron (ZOFRAN ODT) 4 MG disintegrating tablet Take 1 tablet (4 mg total) by mouth every 8 (eight) hours as needed for nausea or vomiting. 05/13/15  Yes Domenic Polite, MD  ONE DAILY MULTIPLE VITAMIN PO Take by mouth.   Yes Historical Provider, MD  polyethylene glycol (MIRALAX / GLYCOLAX) packet Take 17 g by mouth daily as needed for mild constipation.   Yes Historical Provider, MD  RED YEAST RICE EXTRACT PO Take 1,200 mg by mouth daily.   Yes Historical Provider, MD  rOPINIRole (REQUIP) 0.25 MG tablet Take 1 tablet (0.25 mg total) by mouth at bedtime. 05/13/15  Yes Domenic Polite, MD  TOPROL XL 200 MG 24 hr tablet Take 100 mg by mouth daily.  10/23/14  Yes Historical Provider, MD  triamcinolone cream (KENALOG) 0.1 % Apply 1 application topically 2 (two) times daily as needed (itch).  02/19/15  Yes Historical Provider, MD  vitamin C (ASCORBIC ACID) 500 MG tablet  Take 500 mg by mouth daily.   Yes Historical Provider, MD  potassium chloride (K-DUR,KLOR-CON) 10 MEQ tablet Take 10 mEq by mouth daily. 06/06/15   Historical Provider, MD  sodium chloride 1 g tablet Take 2 tablets (2 g total) by mouth 2 (two) times daily with a meal. 05/24/15   Costin Karlyne Greenspan, MD    Allergies:   Allergies  Allergen Reactions  . Adhesive [Tape] Itching  . Chlorthalidone Other (See Comments)    creatinine  increases  . Hctz [Hydrochlorothiazide] Other (See Comments)    CREATNINE INCREASES  . Keflex [Cephalexin]   . Oxsoralen [Methoxsalen]     Social History:  Ambulatory   walker     From facility [facility]   reports that she has quit smoking. She does not have any smokeless tobacco history on file. She reports that she does not drink alcohol or use illicit drugs.     Family History: family history includes Cancer in her sister; Diabetes in her brother;  Heart failure in her father and mother; Stroke in her sister.    Physical Exam: Patient Vitals for the past 24 hrs:  BP Temp Temp src Pulse Resp SpO2 Height Weight  06/13/15 1806 105/69 mmHg - - 76 16 96 % - -  06/13/15 1703 128/66 mmHg - - 73 18 96 % - -  06/13/15 1659 - 98.2 F (36.8 C) - - - - - -  06/13/15 1618 156/64 mmHg 98.2 F (36.8 C) Oral 82 18 98 % '5\' 5"'$  (1.651 m) 58.514 kg (129 lb)    1. General:  in No Acute distress 2. Psychological: Alert and   Oriented 3. Head/ENT:    Dry Mucous Membranes                          Head Non traumatic, neck supple                          Normal   Dentition 4. SKIN:  decreased Skin turgor,  Skin clean Dry and intact no rash 5. Heart: Regular rate and rhythm no Murmur, Rub or gallop 6. Lungs: distant no wheezes or crackles   7. Abdomen: Soft, non-tender, Non distended 8. Lower extremities: no clubbing, cyanosis, or edema 9. Neurologically strength 5/5 in all 4 ext, CN 2-12 intact, finger to nose intact 10. MSK: Normal range of  motion  body mass index is 21.47 kg/(m^2).   Labs on Admission:   Results for orders placed or performed during the hospital encounter of 06/13/15 (from the past 24 hour(s))  Protime-INR     Status: None   Collection Time: 06/13/15  4:31 PM  Result Value Ref Range   Prothrombin Time 13.4 11.6 - 15.2 seconds   INR 1.00 0.00 - 1.49  APTT     Status: None   Collection Time: 06/13/15  4:31 PM  Result Value Ref Range   aPTT 24 24 - 37 seconds  CBC     Status: Abnormal   Collection Time: 06/13/15  4:31 PM  Result Value Ref Range   WBC 5.8 4.0 - 10.5 K/uL   RBC 4.27 3.87 - 5.11 MIL/uL   Hemoglobin 12.1 12.0 - 15.0 g/dL   HCT 35.5 (L) 36.0 - 46.0 %   MCV 83.1 78.0 - 100.0 fL   MCH 28.3 26.0 - 34.0 pg   MCHC 34.1 30.0 - 36.0 g/dL   RDW 13.5 11.5 - 15.5 %   Platelets 251 150 - 400 K/uL  Differential     Status: None   Collection Time: 06/13/15  4:31 PM  Result Value Ref Range   Neutrophils Relative % 56 %   Neutro Abs 3.2 1.7 - 7.7 K/uL   Lymphocytes Relative 27 %   Lymphs Abs 1.5 0.7 - 4.0 K/uL   Monocytes Relative 10 %   Monocytes Absolute 0.6 0.1 - 1.0 K/uL   Eosinophils Relative 7 %   Eosinophils Absolute 0.4 0.0 - 0.7 K/uL   Basophils Relative 0 %   Basophils Absolute 0.0 0.0 - 0.1 K/uL  Comprehensive metabolic panel     Status: Abnormal   Collection Time: 06/13/15  4:31 PM  Result Value Ref Range   Sodium 134 (L) 135 - 145 mmol/L   Potassium 4.2 3.5 - 5.1 mmol/L   Chloride 101 101 - 111 mmol/L   CO2 24 22 - 32 mmol/L   Glucose, Bld 107 (H) 65 -  99 mg/dL   BUN 16 6 - 20 mg/dL   Creatinine, Ser 0.98 0.44 - 1.00 mg/dL   Calcium 9.8 8.9 - 10.3 mg/dL   Total Protein 6.9 6.5 - 8.1 g/dL   Albumin 3.7 3.5 - 5.0 g/dL   AST 21 15 - 41 U/L   ALT 18 14 - 54 U/L   Alkaline Phosphatase 64 38 - 126 U/L   Total Bilirubin 0.6 0.3 - 1.2 mg/dL   GFR calc non Af Amer 49 (L) >60 mL/min   GFR calc Af Amer 57 (L) >60 mL/min   Anion gap 9 5 - 15  I-stat troponin, ED (not at [facility],  [facility])     Status: None   Collection Time: 06/13/15  4:46 PM  Result Value Ref Range   Troponin i, poc 0.01 0.00 - 0.08 ng/mL   Comment 3          CBG monitoring, ED     Status: None   Collection Time: 06/13/15  4:48 PM  Result Value Ref Range   Glucose-Capillary 95 65 - 99 mg/dL  I-Stat Chem 8, ED  (not at [facility], [facility])     Status: Abnormal   Collection Time: 06/13/15  4:48 PM  Result Value Ref Range   Sodium 135 135 - 145 mmol/L   Potassium 4.1 3.5 - 5.1 mmol/L   Chloride 101 101 - 111 mmol/L   BUN 19 6 - 20 mg/dL   Creatinine, Ser 0.70 0.44 - 1.00 mg/dL   Glucose, Bld 102 (H) 65 - 99 mg/dL   Calcium, Ion 1.25 1.13 - 1.30 mmol/L   TCO2 24 0 - 100 mmol/L   Hemoglobin 12.9 12.0 - 15.0 g/dL   HCT 38.0 36.0 - 46.0 %  Urinalysis, Routine w reflex microscopic (not at [facility])     Status: None   Collection Time: 06/13/15  5:52 PM  Result Value Ref Range   Color, Urine YELLOW YELLOW   APPearance CLEAR CLEAR   Specific Gravity, Urine 1.007 1.005 - 1.030   pH 6.5 5.0 - 8.0   Glucose, UA NEGATIVE NEGATIVE mg/dL   Hgb urine dipstick NEGATIVE NEGATIVE   Bilirubin Urine NEGATIVE NEGATIVE   Ketones, ur NEGATIVE NEGATIVE mg/dL   Protein, ur NEGATIVE NEGATIVE mg/dL   Nitrite NEGATIVE NEGATIVE   Leukocytes, UA NEGATIVE NEGATIVE    UA   no evidence of UTI  No results found for: HGBA1C  Estimated Creatinine Clearance: 41.2 mL/min (by C-G formula based on Cr of 0.7).  BNP (last 3 results) No results for input(s): PROBNP in the last 8760 hours.  Other results:  I have pearsonaly reviewed this: ECG REPORT  Rate: 80  Rhythm:  Normal sinus rhythm ST&T Change: No ischemic changes low voltage lead QTC 427  Filed Weights   06/13/15 1618  Weight: 58.514 kg (129 lb)     Cultures:    Component Value Date/Time   SDES BLOOD LEFT ARM 05/19/2015 1906   SPECREQUEST BOTTLES DRAWN AEROBIC AND ANAEROBIC  [place] 05/19/2015 1906   CULT  05/19/2015 1906    NO GROWTH 5 DAYS Performed at [place] 05/24/2015 FINAL 05/19/2015 1906     Radiological Exams on Admission: Dg Chest 2 View  06/13/2015  CLINICAL DATA:  Can't onset of confusion while reading, cough a few weeks ago, controlled hypertension, former smoker, GERD EXAM: CHEST  2 VIEW COMPARISON:  05/15/2015 FINDINGS: Normal heart size and pulmonary vascularity. Calcified tortuous thoracic aorta.  Lungs appear hyperinflated but clear. No pleural effusion or pneumothorax. Osseous demineralization with scattered costal cartilaginous calcifications noted. IMPRESSION: Probable COPD changes. No acute abnormalities. Electronically Signed   By: Lavonia Dana M.D.   On: 06/13/2015 17:47   Ct Head Wo Contrast  06/13/2015  CLINICAL DATA:  Patient with confusion.  Results symptoms. EXAM: CT HEAD WITHOUT CONTRAST TECHNIQUE: Contiguous axial images were obtained from the base of the skull through the vertex without intravenous contrast. COMPARISON:  Brain MRI 05/16/2015 FINDINGS: Ventricles and sulci are appropriate for patient's age. Periventricular and subcortical white matter hypodensity compatible with microvascular ischemic changes. No evidence for acute cortically based infarct, intracranial hemorrhage, mass lesion or mass-effect. Orbits are unremarkable. Paranasal sinuses are well aerated. Mastoid air cells are unremarkable. Calvarium is intact. IMPRESSION: No acute intracranial process. Chronic microvascular ischemic changes. Electronically Signed   By: Lovey Newcomer M.D.   On: 06/13/2015 18:19    Chart has been reviewed  Family    at  Bedside   plan of care was discussed with Daughter [name] [phone]  Assessment/Plan  82 yo F with past hx of HTN no prior hx of CVA herewith sudden onset of confusion suspicious for TIA  Present on Admission:  . TIA (transient ischemic attack)  - will admit based on TIA/CVA protocol, await results of MRA/MRI, Carotid Doppler will not repaet Echo since was done less then 1 month ago and  was normal, obtain cardiac enzymes,  ECG,   Lipid panel, TSH. Order PT/OT evaluation. Will make sure patient is on antiplatelet agent.   Neurology consulted.    Transfer to [facility] . Essential hypertension - somewhat soft BP will hold home meds except Toprol, permissive hypertension . Acute encephalopathy - now resolved, suspect possible TIA   Prophylaxis: SCD    CODE STATUS:   DNR/DNI as per patient    Disposition:                           Back to current facility when stable                            Other plan as per orders.  I have spent a total of 56 min on this admission     Janah Mcculloh 06/13/2015, 8:02 PM    [facility]  Pager [phone]   after 2 AM please page floor coverage PA If 7AM-7PM, please contact the day team taking care of the patient  [email or web address]  Password [email or web address]

## 2015-06-13 NOTE — ED Provider Notes (Signed)
CSN: [id number]     Arrival date & time 06/13/15  1617 History   First MD Initiated Contact with Patient 06/13/15 1647     Chief Complaint  Patient presents with  . Altered Mental Status     (Consider location/radiation/quality/duration/timing/severity/associated sxs/prior Treatment) HPI   She is a 82 year old female with history of hypertension hyperlipidemia GERD presenting today with transient period of confusion. Patient was in her normal state of health and then was in her room at her nursing home when she all of a sudden became very confused was unable to have a operate her phone was unsure how to operate it. She was able in touch with her daughter who is a Marine scientist here. Daughter reports no slurred speech. But her mom was having difficulty understanding her surroundings. No weakness at the time.  Past Medical History  Diagnosis Date  . Hypertension   . GERD (gastroesophageal reflux disease)   . Carpal tunnel syndrome     left  . DVT (deep venous thrombosis) ([place])   . RLS (restless legs syndrome)    Past Surgical History  Procedure Laterality Date  . Abdominal hysterectomy    . Gallbladder surgery    . Carpal tunnel release Left 10/31/2014    Procedure: LEFT CARPAL TUNNEL RELEASE;  Surgeon: Leanora Cover, MD;  Location: [place];  Service: Orthopedics;  Laterality: Left;   No family history on file. Social History  Substance Use Topics  . Smoking status: Former Research scientist (life sciences)  . Smokeless tobacco: None     Comment: quit 20years ago  . Alcohol Use: No   OB History    No data available     Review of Systems  Constitutional: Negative for fever and fatigue.  Respiratory: Negative for cough and chest tightness.   Cardiovascular: Negative for chest pain.  Gastrointestinal: Negative for abdominal distention.  Genitourinary: Negative for dysuria and difficulty urinating.  Musculoskeletal: Negative for joint swelling.  Skin: Negative for rash.  Allergic/Immunologic:  Negative for immunocompromised state.  Neurological: Negative for seizures and speech difficulty.  Psychiatric/Behavioral: Positive for confusion. Negative for agitation.      Allergies  Adhesive; Chlorthalidone; Hctz; Keflex; and Oxsoralen  Home Medications   Prior to Admission medications   Medication Sig Start Date End Date Taking? Authorizing Provider  amLODipine (NORVASC) 10 MG tablet Take 10 mg by mouth daily.   Yes Historical Provider, MD  aspirin 81 MG tablet Take 81 mg by mouth daily.   Yes Historical Provider, MD  bisacodyl (DULCOLAX) 5 MG EC tablet Take 5-15 mg by mouth daily as needed for moderate constipation.   Yes Historical Provider, MD  Calcium Carbonate-Vit D-Min (CALCIUM 1200 PO) Take 1,200 mg by mouth daily.   Yes Historical Provider, MD  Cholecalciferol (VITAMIN D-3) 1000 units CAPS Take 1,000 Units by mouth daily.   Yes Historical Provider, MD  Dextromethorphan-Guaifenesin 5-100 MG/5ML LIQD Take 5 mLs by mouth every 4 (four) hours as needed (cough).   Yes Historical Provider, MD  fish oil-omega-3 fatty acids 1000 MG capsule Take 2,000 mg by mouth daily.   Yes Historical Provider, MD  losartan (COZAAR) 100 MG tablet Take 100 mg by mouth daily.   Yes Historical Provider, MD  naproxen sodium (ANAPROX) 220 MG tablet Take 440 mg by mouth daily as needed (pain).   Yes Historical Provider, MD  omeprazole (PRILOSEC) 20 MG capsule Take 20 mg by mouth daily.   Yes Historical Provider, MD  ondansetron (ZOFRAN ODT) 4 MG disintegrating tablet  Take 1 tablet (4 mg total) by mouth every 8 (eight) hours as needed for nausea or vomiting. 05/13/15  Yes Domenic Polite, MD  ONE DAILY MULTIPLE VITAMIN PO Take by mouth.   Yes Historical Provider, MD  polyethylene glycol (MIRALAX / GLYCOLAX) packet Take 17 g by mouth daily as needed for mild constipation.   Yes Historical Provider, MD  RED YEAST RICE EXTRACT PO Take 1,200 mg by mouth daily.   Yes Historical Provider, MD  rOPINIRole (REQUIP)  0.25 MG tablet Take 1 tablet (0.25 mg total) by mouth at bedtime. 05/13/15  Yes Domenic Polite, MD  TOPROL XL 200 MG 24 hr tablet Take 100 mg by mouth daily.  10/23/14  Yes Historical Provider, MD  triamcinolone cream (KENALOG) 0.1 % Apply 1 application topically 2 (two) times daily as needed (itch).  02/19/15  Yes Historical Provider, MD  vitamin C (ASCORBIC ACID) 500 MG tablet Take 500 mg by mouth daily.   Yes Historical Provider, MD  potassium chloride (K-DUR,KLOR-CON) 10 MEQ tablet Take 10 mEq by mouth daily. 06/06/15   Historical Provider, MD  sodium chloride 1 g tablet Take 2 tablets (2 g total) by mouth 2 (two) times daily with a meal. 05/24/15   Costin Karlyne Greenspan, MD   BP 105/69 mmHg  Pulse 76  Temp(Src) 98.2 F (36.8 C) (Oral)  Resp 16  Ht '5\' 5"'$  (1.651 m)  Wt 129 lb (58.514 kg)  BMI 21.47 kg/m2  SpO2 96% Physical Exam  Constitutional: She is oriented to person, place, and time. She appears well-developed and well-nourished.  HENT:  Head: Normocephalic and atraumatic.  Eyes: Conjunctivae are normal. Right eye exhibits no discharge.  Neck: Neck supple.  Cardiovascular: Normal rate, regular rhythm and normal heart sounds.   No murmur heard. Pulmonary/Chest: Effort normal and breath sounds normal. She has no wheezes. She has no rales.  Abdominal: Soft. She exhibits no distension. There is no tenderness.  Musculoskeletal: Normal range of motion. She exhibits no edema.  Neurological: She is oriented to person, place, and time. No cranial nerve deficit.  Normal finger to nose Normal shin to heel. No pronator drift.   Skin: Skin is warm and dry. No rash noted. She is not diaphoretic.  Psychiatric: She has a normal mood and affect. Her behavior is normal.  Nursing note and vitals reviewed.   ED Course  Procedures (including critical care time) Labs Review Labs Reviewed  CBC - Abnormal; Notable for the following:    HCT 35.5 (*)    All other components within normal limits   COMPREHENSIVE METABOLIC PANEL - Abnormal; Notable for the following:    Sodium 134 (*)    Glucose, Bld 107 (*)    GFR calc non Af Amer 49 (*)    GFR calc Af Amer 57 (*)    All other components within normal limits  I-STAT CHEM 8, ED - Abnormal; Notable for the following:    Glucose, Bld 102 (*)    All other components within normal limits  URINE CULTURE  PROTIME-INR  APTT  DIFFERENTIAL  URINALYSIS, ROUTINE W REFLEX MICROSCOPIC (NOT AT [facility])  I-STAT TROPOININ, ED  CBG MONITORING, ED    Imaging Review Dg Chest 2 View  06/13/2015  CLINICAL DATA:  Can't onset of confusion while reading, cough a few weeks ago, controlled hypertension, former smoker, GERD EXAM: CHEST  2 VIEW COMPARISON:  05/15/2015 FINDINGS: Normal heart size and pulmonary vascularity. Calcified tortuous thoracic aorta. Lungs appear hyperinflated but clear. No  pleural effusion or pneumothorax. Osseous demineralization with scattered costal cartilaginous calcifications noted. IMPRESSION: Probable COPD changes. No acute abnormalities. Electronically Signed   By: Lavonia Dana M.D.   On: 06/13/2015 17:47   Ct Head Wo Contrast  06/13/2015  CLINICAL DATA:  Patient with confusion.  Results symptoms. EXAM: CT HEAD WITHOUT CONTRAST TECHNIQUE: Contiguous axial images were obtained from the base of the skull through the vertex without intravenous contrast. COMPARISON:  Brain MRI 05/16/2015 FINDINGS: Ventricles and sulci are appropriate for patient's age. Periventricular and subcortical white matter hypodensity compatible with microvascular ischemic changes. No evidence for acute cortically based infarct, intracranial hemorrhage, mass lesion or mass-effect. Orbits are unremarkable. Paranasal sinuses are well aerated. Mastoid air cells are unremarkable. Calvarium is intact. IMPRESSION: No acute intracranial process. Chronic microvascular ischemic changes. Electronically Signed   By: Lovey Newcomer M.D.   On: 06/13/2015 18:19   I have personally  reviewed and evaluated these images and lab results as part of my medical decision-making.   EKG Interpretation None      MDM   Final diagnoses:  None    Patient 82 year old female with history of hypertension hyperlipidemia presenting today with acute period of change and confusion. Although not typical for stroke, given that she had no weakness or slurred speech, the acute onset and offset is consistent with an ischemic process. Would workup for TIA. We'll get CT brain. We'll discuss with neurology.  7:58 PM Seen by neurology. Duiscussed with hosptilist. Will admit to [facility] on tele.   Kyle Luppino Julio Alm, MD 06/13/15 [phone]

## 2015-06-13 NOTE — ED Notes (Signed)
Pt's contact:  Martha(daughter)--- tel# [phone]

## 2015-06-14 ENCOUNTER — Observation Stay (HOSPITAL_COMMUNITY): Payer: Medicare Other

## 2015-06-14 ENCOUNTER — Observation Stay (HOSPITAL_BASED_OUTPATIENT_CLINIC_OR_DEPARTMENT_OTHER): Payer: Medicare Other

## 2015-06-14 ENCOUNTER — Observation Stay (HOSPITAL_COMMUNITY)
Admit: 2015-06-14 | Discharge: 2015-06-14 | Disposition: A | Discharge status: Home / Self Care | Payer: Medicare Other | Attending: Neurology | Admitting: Neurology

## 2015-06-14 DIAGNOSIS — I10 Essential (primary) hypertension: Secondary | ICD-10-CM | POA: Diagnosis not present

## 2015-06-14 DIAGNOSIS — G459 Transient cerebral ischemic attack, unspecified: Secondary | ICD-10-CM

## 2015-06-14 DIAGNOSIS — E785 Hyperlipidemia, unspecified: Secondary | ICD-10-CM | POA: Insufficient documentation

## 2015-06-14 DIAGNOSIS — Z86718 Personal history of other venous thrombosis and embolism: Secondary | ICD-10-CM | POA: Diagnosis not present

## 2015-06-14 DIAGNOSIS — K219 Gastro-esophageal reflux disease without esophagitis: Secondary | ICD-10-CM | POA: Diagnosis not present

## 2015-06-14 DIAGNOSIS — G9340 Encephalopathy, unspecified: Secondary | ICD-10-CM | POA: Diagnosis not present

## 2015-06-14 DIAGNOSIS — I639 Cerebral infarction, unspecified: Secondary | ICD-10-CM | POA: Insufficient documentation

## 2015-06-14 DIAGNOSIS — R410 Disorientation, unspecified: Secondary | ICD-10-CM | POA: Diagnosis not present

## 2015-06-14 DIAGNOSIS — R4182 Altered mental status, unspecified: Secondary | ICD-10-CM | POA: Diagnosis not present

## 2015-06-14 LAB — LIPID PANEL
CHOL/HDL RATIO: 3.6 ratio
Cholesterol: 189 mg/dL (ref 0–200)
HDL: 53 mg/dL (ref >40)
LDL Cholesterol: 122 mg/dL — ABNORMAL HIGH (ref 0–99)
Triglycerides: 68 mg/dL (ref <150)
VLDL: 14 mg/dL (ref 0–40)

## 2015-06-14 LAB — TROPONIN I

## 2015-06-14 MED ORDER — ATORVASTATIN CALCIUM 20 MG PO TABS: 20.0000 mg | ORAL_TABLET | Freq: Every day | ORAL | Status: DC

## 2015-06-14 MED ORDER — ASPIRIN 325 MG PO TABS: 325.0000 mg | ORAL_TABLET | Freq: Every day | ORAL | Status: DC

## 2015-06-14 MED ORDER — ATORVASTATIN CALCIUM 10 MG PO TABS: 20.0000 mg | ORAL_TABLET | Freq: Every day | ORAL | Status: DC

## 2015-06-14 NOTE — Progress Notes (Signed)
STROKE TEAM PROGRESS NOTE   HISTORY AT TIME OF ADMISSION Cassidy Ellison is a 82 y.o. female who was in her normal state of health earlier today when she had sudden onset confusion. She was looking at her nook and using it appropriately, then she had sudden onset difficulty using it and difficulty following some written directions. She states that she was looking at the directions, it appeared as if someone for missing when she knew they were written down there. She also had difficulty with completing simple tasks. She was finally able to send a message to her daughter to come over, and on arrival the daughter states that she was confused but already had improved considerably compared to what the patient was describing prior to her arrival. She advised her mother to come into the emergency room and by the time they got to the emergency room she was acting almost her normal self.   LKW: Currently well tpa given?: no, resolve symptoms    SUBJECTIVE (INTERVAL HISTORY) Her family is at the bedside.  Overall she feels her condition is completely resolved. I discussed the laboratory and radiographic findings with the patient and her family.  In addition, met with primary team to discuss discharge plan   OBJECTIVE Temp:  [97.7 F (36.5 C)-98.2 F (36.8 C)] 97.9 F (36.6 C) (02/04 0700) Pulse Rate:  [72-82] 76 (02/04 0700) Cardiac Rhythm:  [-] Normal sinus rhythm (02/03 2301) Resp:  [14-18] 16 (02/04 0700) BP: (94-156)/(53-69) 125/62 mmHg (02/04 0700) SpO2:  [90 %-98 %] 97 % (02/04 0700) Weight:  [58.514 kg (129 lb)-59.603 kg (131 lb 6.4 oz)] 59.603 kg (131 lb 6.4 oz) (02/03 2253)  CBC:  Recent Labs Lab 06/13/15 1631 06/13/15 1648  WBC 5.8  --   NEUTROABS 3.2  --   HGB 12.1 12.9  HCT 35.5* 38.0  MCV 83.1  --   PLT 251  --     Basic Metabolic Panel:  Recent Labs Lab 06/13/15 1631 06/13/15 1648  NA 134* 135  K 4.2 4.1  CL 101 101  CO2 24  --   GLUCOSE 107* 102*  BUN 16 19   CREATININE 0.98 0.70  CALCIUM 9.8  --     Lipid Panel:    Component Value Date/Time   CHOL 189 06/14/2015 0556   TRIG 68 06/14/2015 0556   HDL 53 06/14/2015 0556   CHOLHDL 3.6 06/14/2015 0556   VLDL 14 06/14/2015 0556   LDLCALC 122* 06/14/2015 0556   HgbA1c: No results found for: HGBA1C Urine Drug Screen: No results found for: LABOPIA, COCAINSCRNUR, LABBENZ, AMPHETMU, THCU, LABBARB    IMAGING  Dg Chest 2 View 06/13/2015   Probable COPD changes. No acute abnormalities.  Ct Head Wo Contrast 06/13/2015   No acute intracranial process. Chronic microvascular ischemic changes.   Mr Jodene Nam Head/brain Wo Cm 06/14/2015    MRI HEAD:  Punctate focus of acute ischemia RIGHT cerebral peduncle versus artifact. Otherwise stable appearance of the brain from May 16, 2015 including involutional changes and mild to moderate chronic small vessel ischemic disease.   MRA HEAD:  No acute large vessel occlusion or high-grade stenosis. 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    PHYSICAL EXAM General - Well nourished, well developed, in NAD   Cardiovascular - Regular rate and rhythm Pulmonary: CTA Abdomen: NT, ND, normal bowel sounds Extremities: No C/C/E  Neurological Exam Mental Status: Normal Orientation:  Oriented to person, place and time Speech:  Fluent; no dysarthria  Cranial Nerves:  PERRL; EOMI; visual fields full, face grossly symmetric, hearing grossly decreased (patient denies history of hearing challenges); shrug symmetric and tongue midline  Motor Exam:  Tone:  Within normal limits; Strength: 5/5 throughout  Sensory: Intact to light touch throughout  Coordination:  Intact finger to nose  Gait: Deferred   ASSESSMENT/PLAN Cassidy Ellison is a 82 y.o. female with history of hypertension, gastroesophageal reflux disease, as DVT, and remote tobacco history presenting with transient confusion. She did not receive IV t-PA due to resolution of  deficits.   Probable Stroke:  Non-dominant RIGHT cerebral peduncle infarct secondary to small vessel disease.  Resultant  Confusion using phone, following written PT instructions; these symptoms are not anatomically localized to the cerebral peduncle.  I explained this to the family.  It is certainly possible that patient had an embolus that showered smaaler embolic material to present as transient symptoms   MRI - Punctate focus of acute ischemia RIGHT cerebral peduncle versus artifact.   MRA - no large vessel occlusion or significant stenosis.  Carotid Doppler - preliminary negative  2D Echo - 05/16/2015 - EF 60-65%. No cardiac source of emboli identified.  LDL - 122; will recommend statin  HgbA1c pending  VTE prophylaxis - SCDs  Diet Heart Room service appropriate?: Yes; Fluid consistency:: Thin  aspirin 81 mg daily prior to admission, now on aspirin 325 mg daily  Patient counseled to be compliant with her antithrombotic medications  Ongoing aggressive stroke risk factor management  Therapy recommendations: Pending   Disposition: Pending  Hypertension  Blood pressure tends to run low.       Permissive hypertension (OK if < 220/120) but gradually normalize in 5-7 days  Hyperlipidemia  Home meds:  No lipid lowering medications prior to admission  LDL 122, goal < 70  Lipitor 20 mg daily added  Continue statin at discharge  Other Stroke Risk Factors  Advanced age  Cigarette smoker, quit smoking.   Other Active Problems  Two small aneurysms by MRA (80m)  Hospital day # 1  [name] [facility] Pager ([phone]/08/2015, 9:33 AM   ATTENDING NOTE: Patient was seen and examined by me personally. Documentation reflects findings. The laboratory and radiographic studies reviewed by me. ROS completed by me personally and pertinent positives fully documented  Condition:stable  Assessment and plan completed by me personally and  fully documented above. Plans/Recommendations include:     Patient should be evaluated by PT/OT prior to discharged and cleared to return home to therapies already established  Despite advanced age; would start low dose statin  Recommend high dose ASA for now given reports of fall risk, would not recommend stronger antiplatelet management at this time   SIGNED BY: Dr. CElissa Hefty   To contact Stroke Continuity provider, please refer to [email or web address] After hours, contact General Neurology

## 2015-06-14 NOTE — Progress Notes (Signed)
EEG Completed; Results Pending  

## 2015-06-14 NOTE — Progress Notes (Signed)
pt d/c to home by car with family. Assessment stable. Prescription given. RN contacted CM to ask about setting up home health. CM stated that since pt is from Ind living facility that the pt needs to contact her home health agency that was providing therapy prior to admission and let them know she was home. Daughter stated she would take care of it.Marland Kitchen

## 2015-06-14 NOTE — Progress Notes (Signed)
VASCULAR LAB PRELIMINARY  PRELIMINARY  PRELIMINARY  PRELIMINARY  Carotid duplex completed.    Preliminary report:  1-39% ICA plaquing.  Vertebral artery flow is antegrade.   Izekiel Flegel, RVT 06/14/2015, 10:38 AM

## 2015-06-14 NOTE — Evaluation (Signed)
Occupational Therapy Evaluation Patient Details Name: Cassidy Ellison MRN: [id number] DOB: [birth year] Today's Date: 06/14/2015    History of Present Illness Patient is a 82 y/o female with hx of HTN, DVT, RLS presents with episode of confusion at home. Symptoms have resolved. MRI and CT unremarkable. Probable TIA.    Clinical Impression   Pt seen for above.  Currently, she requires mod I for ADLs, but does demonstrate impaired [place] due to h/o bil. Carpal tunnel and impacts her ability to manipulate small items, medications, etc.   Feel she would benefit from [facility] to address coordination deficits and assist with modifications in home environment.  All further OT needs to be deferred to [facility], acute OT will sign off at this time.     Follow Up Recommendations  Home health OT;Supervision - Intermittent    Equipment Recommendations  None recommended by OT    Recommendations for Other Services       Precautions / Restrictions Precautions Precautions: Fall      Mobility Bed Mobility                  Transfers Overall transfer level: Modified independent                    Balance Overall balance assessment: History of Falls                                          ADL Overall ADL's : Modified independent                                       General ADL Comments: Pt appears to be at baseline, but does demonstrate impaired [facility]      Vision Vision Assessment?: No apparent visual deficits   Perception Perception Perception Tested?: Yes   Praxis Praxis Praxis tested?: Within functional limits    Pertinent Vitals/Pain Pain Assessment: No/denies pain     Hand Dominance Right   Extremity/Trunk Assessment Upper Extremity Assessment Upper Extremity Assessment: RUE deficits/detail;LUE deficits/detail RUE Deficits / Details: Pt with h/o carpal tunnel and is s/p release on Lt.  She demonstrates impaired [place] bil. hands.  She  requires use of built up handles for utensils and reports difficulty manipulating small objects, pills, etc.  RUE Coordination: decreased fine motor LUE Deficits / Details: Pt with h/o carpal tunnel and is s/p release on Lt.  She demonstrates impaired [place] bil. hands.  She requires use of built up handles for utensils and reports difficulty manipulating small objects, pills, etc.  LUE Coordination: decreased fine motor   Lower Extremity Assessment Lower Extremity Assessment: Overall WFL for tasks assessed   Cervical / Trunk Assessment Cervical / Trunk Assessment: Normal   Communication Communication Communication: No difficulties   Cognition Arousal/Alertness: Awake/alert Behavior During Therapy: WFL for tasks assessed/performed Overall Cognitive Status: Within Functional Limits for tasks assessed                     General Comments       Exercises       Shoulder Instructions      Home Living Family/patient expects to be discharged to:: Private residence Living Arrangements: Alone Available Help at Discharge: Family;Available PRN/intermittently Type of Home: Independent living facility Home Access: Level entry;Elevator  Home Layout: One level     Bathroom Shower/Tub: Tub/shower unit (walk in tub ) Shower/tub characteristics: Curtain Bathroom Toilet: Handicapped height Bathroom Accessibility: Yes How Accessible: Accessible via walker Home Equipment: Walker - 4 wheels;Grab bars - toilet;Grab bars - tub/shower;Shower seat   Additional Comments: Pt lives at [facility] independent [facility]       Prior Functioning/Environment Level of Independence: Independent with assistive device(s)        Comments: Walks to dining hall. Does exercise classes and has been doing HHPT.    OT Diagnosis: Generalized weakness   OT Problem List: Decreased coordination   OT Treatment/Interventions: Therapeutic exercise;Therapeutic activities;Patient/family education    OT  Goals(Current goals can be found in the care plan section) Acute Rehab OT Goals Patient Stated Goal: to go home  OT Goal Formulation: All assessment and education complete, DC therapy  OT Frequency:     Barriers to D/C:            Co-evaluation              End of Session Equipment Utilized During Treatment: Rolling walker Nurse Communication: Mobility status  Activity Tolerance: Patient tolerated treatment well Patient left: in chair;with call bell/phone within reach;with family/visitor present   Time: 8347-5830 OT Time Calculation (min): 28 min Charges:  OT General Charges $OT Visit: 1 Procedure OT Evaluation $OT Eval Moderate Complexity: 1 Procedure OT Treatments $Therapeutic Activity: 8-22 mins G-Codes:    Dewie Ahart M 06/25/2015, 2:20 PM

## 2015-06-14 NOTE — Progress Notes (Deleted)
OT Cancellation Note  Patient Details Name: Cassidy Ellison MRN: [id number] DOB: [birth year]   Cancelled Treatment:    Reason Eval/Treat Not Completed: OT screened, no needs identified, will sign off. Pt functioning at baseline according to PT note and performing transfer and ambulation at mod I level. Please re-consult if needs or situation changes.  Redmond Baseman, OTR/L Pager: [phone] 06/14/2015, 1:09 PM

## 2015-06-14 NOTE — Evaluation (Signed)
Physical Therapy Evaluation Patient Details Name: ODIE RAUEN MRN: [id number] DOB: [birth year] Today's Date: 06/14/2015   History of Present Illness  Patient is a 82 y/o female with hx of HTN, DVT, RLS presents with episode of confusion at home. Symptoms have resolved. MRI and CT unremarkable. Probable TIA.   Clinical Impression  Patient reports feeling back to baseline and that all symptoms have resolved. Tolerated ambulation with supervision-Mod I with use of RW for safety. Recommend continuing HHPT for higher level balance activities. Encourage daily ambulation while in hospital to maintain strength/mobility. Functioning close to baseline. Pt does not require further skilled therapy services while in hospital. Discharge from therapy.    Follow Up Recommendations Home health PT;Supervision - Intermittent    Equipment Recommendations  None recommended by PT    Recommendations for Other Services       Precautions / Restrictions Precautions Precautions: Fall Restrictions Weight Bearing Restrictions: No      Mobility  Bed Mobility Overal bed mobility: Needs Assistance Bed Mobility: Supine to Sit     Supine to sit: Modified independent (Device/Increase time);HOB elevated        Transfers Overall transfer level: Needs assistance Equipment used: Rolling walker (2 wheeled) Transfers: Sit to/from Stand Sit to Stand: Modified independent (Device/Increase time)         General transfer comment: Stood with and without RW. Steady. No assist needed. Stood from [employer], from toilet x1. Transferred to chair post ambulation bout.  Ambulation/Gait Ambulation/Gait assistance: Modified independent (Device/Increase time);Supervision Ambulation Distance (Feet): 350 Feet Assistive device: Rolling walker (2 wheeled) Gait Pattern/deviations: Step-through pattern;Decreased stride length;Drifts right/left   Gait velocity interpretation: at or above normal speed for age/gender General Gait  Details: Supervision initially progressing to MOd I with RW. Ambulating within room with IV pole. No LOB.  Stairs            Wheelchair Mobility    Modified Rankin (Stroke Patients Only) Modified Rankin (Stroke Patients Only) Pre-Morbid Rankin Score: Slight disability Modified Rankin: Slight disability     Balance Overall balance assessment: Needs assistance Sitting-balance support: Feet supported;No upper extremity supported Sitting balance-Leahy Scale: Good     Standing balance support: During functional activity Standing balance-Leahy Scale: Fair Standing balance comment: Able to stand and wash hands at sink without difficulty.                              Pertinent Vitals/Pain Pain Assessment: No/denies pain    Home Living Family/patient expects to be discharged to:: Other (Comment) (ILF- [facility])     Type of Home: Independent living facility Home Access: Level entry     Home Layout: One level Home Equipment: Walker - 4 wheels;Grab bars - tub/shower;Toilet riser;Shower seat      Prior Function Level of Independence: Independent with assistive device(s)         Comments: Walks to dining hall. Does exercise classes and has been doing HHPT.     Hand Dominance        Extremity/Trunk Assessment   Upper Extremity Assessment: Overall WFL for tasks assessed;Defer to OT evaluation           Lower Extremity Assessment: Overall WFL for tasks assessed      Cervical / Trunk Assessment: Normal  Communication   Communication: No difficulties  Cognition Arousal/Alertness: Awake/alert Behavior During Therapy: WFL for tasks assessed/performed Overall Cognitive Status: Within Functional Limits for tasks assessed  General Comments: Some mild difficulty getting a word out here and there but not sure if this is baseline.     General Comments General comments (skin integrity, edema, etc.): Discussed doing exercise  classes at ILF and continuing with HHPT. Encouraged continued use of rollator at home for safety.     Exercises        Assessment/Plan    PT Assessment All further PT needs can be met in the next venue of care  PT Diagnosis Difficulty walking   PT Problem List Decreased balance;Decreased mobility;Decreased activity tolerance  PT Treatment Interventions     PT Goals (Current goals can be found in the Care Plan section) Acute Rehab PT Goals Patient Stated Goal: back to her apartment PT Goal Formulation: All assessment and education complete, DC therapy    Frequency     Barriers to discharge        Co-evaluation               End of Session Equipment Utilized During Treatment: Gait belt Activity Tolerance: Patient tolerated treatment well Patient left: in chair;with call bell/phone within reach Nurse Communication: Mobility status    Functional Assessment Tool Used: clinical judgment Functional Limitation: Mobility: Walking and moving around Mobility: Walking and Moving Around Current Status ([id number]): At least 1 percent but less than 20 percent impaired, limited or restricted Mobility: Walking and Moving Around Goal Status [phone]): At least 1 percent but less than 20 percent impaired, limited or restricted Mobility: Walking and Moving Around Discharge Status [phone]): At least 1 percent but less than 20 percent impaired, limited or restricted    Time: 0815-0839 PT Time Calculation (min) (ACUTE ONLY): 24 min   Charges:   PT Evaluation $PT Eval High Complexity: 1 Procedure PT Treatments $Gait Training: 8-22 mins   PT G Codes:   PT G-Codes **NOT FOR INPATIENT CLASS** Functional Assessment Tool Used: clinical judgment Functional Limitation: Mobility: Walking and moving around Mobility: Walking and Moving Around Current Status (W9675): At least 1 percent but less than 20 percent impaired, limited or restricted Mobility: Walking and Moving Around Goal Status [phone]):  At least 1 percent but less than 20 percent impaired, limited or restricted Mobility: Walking and Moving Around Discharge Status [phone]): At least 1 percent but less than 20 percent impaired, limited or restricted    [place] 06/14/2015, 9:13 AM Wray Kearns, PT, DPT [phone]

## 2015-06-14 NOTE — Procedures (Signed)
History: 82 year old female with transient confusion  Sedation: None  Technique: This is a 21 channel routine scalp EEG performed at the bedside with bipolar and monopolar montages arranged in accordance to the international 10/20 system of electrode placement. One channel was dedicated to EKG recording.    Background: The background consists of intermixed alpha and beta activities. There is a well defined posterior dominant rhythm of 9 Hz that attenuates with eye opening. Sleep is recorded with normal appearing structures.   Photic stimulation: Physiologic driving is not performed  EEG Abnormalities: None  Clinical Interpretation: This normal EEG is recorded in the waking and sleep state. There was no seizure or seizure predisposition recorded on this study. Please note that a normal EEG does not preclude the possibility of epilepsy.   Roland Rack, MD [facility] [phone]  If 7pm- 7am, please page neurology on call as listed in [place].

## 2015-06-14 NOTE — Discharge Instructions (Signed)
Follow with Primary MD FULP, CAMMIE, MD in 7 days   Get CBC, CMP, 2 view Chest X ray checked  by Primary MD next visit.    Activity: As tolerated with Full fall precautions use walker/cane & assistance as needed   Disposition ALF   Diet:   Heart Healthy   with feeding assistance and aspiration precautions.  For Heart failure patients - Check your Weight same time everyday, if you gain over 2 pounds, or you develop in leg swelling, experience more shortness of breath or chest pain, call your Primary MD immediately. Follow Cardiac Low Salt Diet and 1.5 lit/day fluid restriction.   On your next visit with your primary care physician please Get Medicines reviewed and adjusted.   Please request your Prim.MD to go over all Hospital Tests and Procedure/Radiological results at the follow up, please get all Hospital records sent to your Prim MD by signing hospital release before you go home.   If you experience worsening of your admission symptoms, develop shortness of breath, life threatening emergency, suicidal or homicidal thoughts you must seek medical attention immediately by calling 911 or calling your MD immediately  if symptoms less severe.  You Must read complete instructions/literature along with all the possible adverse reactions/side effects for all the Medicines you take and that have been prescribed to you. Take any new Medicines after you have completely understood and accpet all the possible adverse reactions/side effects.   Do not drive, operating heavy machinery, perform activities at heights, swimming or participation in water activities or provide baby sitting services if your were admitted for syncope or siezures until you have seen by Primary MD or a Neurologist and advised to do so again.  Do not drive when taking Pain medications.    Do not take more than prescribed Pain, Sleep and Anxiety Medications  Special Instructions: If you have smoked or chewed Tobacco  in the  last 2 yrs please stop smoking, stop any regular Alcohol  and or any Recreational drug use.  Wear Seat belts while driving.   Please note  You were cared for by a hospitalist during your hospital stay. If you have any questions about your discharge medications or the care you received while you were in the hospital after you are discharged, you can call the unit and asked to speak with the hospitalist on call if the hospitalist that took care of you is not available. Once you are discharged, your primary care physician will handle any further medical issues. Please note that NO REFILLS for any discharge medications will be authorized once you are discharged, as it is imperative that you return to your primary care physician (or establish a relationship with a primary care physician if you do not have one) for your aftercare needs so that they can reassess your need for medications and monitor your lab values. STROKE/TIA DISCHARGE INSTRUCTIONS SMOKING Cigarette smoking nearly doubles your risk of having a stroke & is the single most alterable risk factor  If you smoke or have smoked in the last 12 months, you are advised to quit smoking for your health.  Most of the excess cardiovascular risk related to smoking disappears within a year of stopping.  Ask you doctor about anti-smoking medications  [place] Quit Line: 1-800-QUIT NOW  Free Smoking Cessation Classes [phone]  CHOLESTEROL Know your levels; limit fat & cholesterol in your diet  Lipid Panel     Component Value Date/Time   CHOL 189 06/14/2015 0556  TRIG 68 06/14/2015 0556   HDL 53 06/14/2015 0556   CHOLHDL 3.6 06/14/2015 0556   VLDL 14 06/14/2015 0556   LDLCALC 122* 06/14/2015 0556      Many patients benefit from treatment even if their cholesterol is at goal.  Goal: Total Cholesterol (CHOL) less than 160  Goal:  Triglycerides (TRIG) less than 150  Goal:  HDL greater than 40  Goal:  LDL (LDLCALC) less than 100   BLOOD  PRESSURE American Stroke Association blood pressure target is less that 120/80 mm/Hg  Your discharge blood pressure is:  BP: (!) 145/82 mmHg  Monitor your blood pressure  Limit your salt and alcohol intake  Many individuals will require more than one medication for high blood pressure  DIABETES (A1c is a blood sugar average for last 3 months) Goal HGBA1c is under 7% (HBGA1c is blood sugar average for last 3 months)  Diabetes: No known diagnosis of diabetes       Your HGBA1c can be lowered with medications, healthy diet, and exercise.  Check your blood sugar as directed by your physician  Call your physician if you experience unexplained or low blood sugars.  PHYSICAL ACTIVITY/REHABILITATION Goal is 30 minutes at least 4 days per week  Activity: Increase activity slowly, Therapies: Physical Therapy: Nursing Facility and Occupational Therapy: [place] Return to work N/A  Activity decreases your risk of heart attack and stroke and makes your heart stronger.  It helps control your weight and blood pressure; helps you relax and can improve your mood.  Participate in a regular exercise program.  Talk with your doctor about the best form of exercise for you (dancing, walking, swimming, cycling).  DIET/WEIGHT Goal is to maintain a healthy weight  Your discharge diet is: Diet regular Room service appropriate?: Yes; Fluid consistency:: Thin Diet - low sodium heart healthy  Your height is:  Height: '5\' 5"'$  (165.1 cm) Your current weight is: Weight: 59.603 kg (131 lb 6.4 oz) Your Body Mass Index (BMI) is:  BMI (Calculated): 21.9  Following the type of diet specifically designed for you will help prevent another stroke.  Your goal weight range is:  111-150  Your goal Body Mass Index (BMI) is 19-24.  Healthy food habits can help reduce 3 risk factors for stroke:  High cholesterol, hypertension, and excess weight.  RESOURCES Stroke/Support Group:  Call [phone]   STROKE EDUCATION  PROVIDED/REVIEWED AND GIVEN TO PATIENT Stroke warning signs and symptoms How to activate emergency medical system (call 911). Medications prescribed at discharge. Need for follow-up after discharge. Personal risk factors for stroke. Pneumonia vaccine given: No Flu vaccine given: No My questions have been answered, the writing is legible, and I understand these instructions.  I will adhere to these goals & educational materials that have been provided to me after my discharge from the hospital.    Transient Ischemic Attack A transient ischemic attack (TIA) is a "warning stroke" that causes stroke-like symptoms. Unlike a stroke, a TIA does not cause permanent damage to the brain. The symptoms of a TIA can happen very fast and do not last long. It is important to know the symptoms of a TIA and what to do. This can help prevent a major stroke or death. CAUSES  A TIA is caused by a temporary blockage in an artery in the brain or neck (carotid artery). The blockage does not allow the brain to get the blood supply it needs and can cause different symptoms. The blockage can be caused  by either:  A blood clot.  Fatty buildup (plaque) in a neck or brain artery. RISK FACTORS  High blood pressure (hypertension).  High cholesterol.  Diabetes mellitus.  Heart disease.  The buildup of plaque in the blood vessels (peripheral artery disease or atherosclerosis).  The buildup of plaque in the blood vessels that provide blood and oxygen to the brain (carotid artery stenosis).  An abnormal heart rhythm (atrial fibrillation).  Obesity.  Using any tobacco products, including cigarettes, chewing tobacco, or electronic cigarettes.  Taking oral contraceptives, especially in combination with using tobacco.  Physical inactivity.  A diet high in fats, salt (sodium), and calories.  Excessive alcohol use.  Use of illegal drugs (especially cocaine and methamphetamine).  Being female.  Being African  American.  Being over the age of 27 years.  Family history of stroke.  Previous history of blood clots, stroke, TIA, or heart attack.  Sickle cell disease. SIGNS AND SYMPTOMS  TIA symptoms are the same as a stroke but are temporary. These symptoms usually develop suddenly, or may be newly present upon waking from sleep:  Sudden weakness or numbness of the face, arm, or leg, especially on one side of the body.  Sudden trouble walking or difficulty moving arms or legs.  Sudden confusion.  Sudden personality changes.  Trouble speaking (aphasia) or understanding.  Difficulty swallowing.  Sudden trouble seeing in one or both eyes.  Double vision.  Dizziness.  Loss of balance or coordination.  Sudden severe headache with no known cause.  Trouble reading or writing.  Loss of bowel or bladder control.  Loss of consciousness. DIAGNOSIS  Your health care provider may be able to determine the presence or absence of a TIA based on your symptoms, history, and physical exam. CT scan of the brain is usually performed to help identify a TIA. Other tests may include:  Electrocardiography (ECG).  Continuous heart monitoring.  Echocardiography.  Carotid ultrasonography.  MRI.  A scan of the brain circulation.  Blood tests. TREATMENT  Since the symptoms of TIA are the same as a stroke, it is important to seek treatment as soon as possible. You may need a medicine to dissolve a blood clot (thrombolytic) if that is the cause of the TIA. This medicine cannot be given if too much time has passed. Treatment may also include:   Rest, oxygen, fluids through an IV tube, and medicines to thin the blood (anticoagulants).  Measures will be taken to prevent short-term and long-term complications, including infection from breathing foreign material into the lungs (aspiration pneumonia), blood clots in the legs, and falls.  Procedures to either remove plaque in the carotid arteries or  dilate carotid arteries that have narrowed due to plaque. Those procedures are:  Carotid endarterectomy.  Carotid angioplasty and stenting.  Medicines and diet may be used to address diabetes, high blood pressure, and other underlying risk factors. HOME CARE INSTRUCTIONS   Take medicines only as directed by your health care provider. Follow the directions carefully. Medicines may be used to control risk factors for a stroke. Be sure you understand all your medicine instructions.  You may be told to take aspirin or the anticoagulant warfarin. Warfarin needs to be taken exactly as instructed.  Taking too much or too little warfarin is dangerous. Too much warfarin increases the risk of bleeding. Too little warfarin continues to allow the risk for blood clots. While taking warfarin, you will need to have regular blood tests to measure your blood clotting time. A  PT blood test measures how long it takes for blood to clot. Your PT is used to calculate another value called an INR. Your PT and INR help your health care provider to adjust your dose of warfarin. The dose can change for many reasons. It is critically important that you take warfarin exactly as prescribed.  Many foods, especially foods high in vitamin K can interfere with warfarin and affect the PT and INR. Foods high in vitamin K include spinach, kale, broccoli, cabbage, collard and turnip greens, Brussels sprouts, peas, cauliflower, seaweed, and parsley, as well as beef and pork liver, green tea, and soybean oil. You should eat a consistent amount of foods high in vitamin K. Avoid major changes in your diet, or notify your health care provider before changing your diet. Arrange a visit with a dietitian to answer your questions.  Many medicines can interfere with warfarin and affect the PT and INR. You must tell your health care provider about any and all medicines you take; this includes all vitamins and supplements. Be especially cautious  with aspirin and anti-inflammatory medicines. Do not take or discontinue any prescribed or over-the-counter medicine except on the advice of your health care provider or pharmacist.  Warfarin can have side effects, such as excessive bruising or bleeding. You will need to hold pressure over cuts for longer than usual. Your health care provider or pharmacist will discuss other potential side effects.  Avoid sports or activities that may cause injury or bleeding.  Be careful when shaving, flossing your teeth, or handling sharp objects.  Alcohol can change the body's ability to handle warfarin. It is best to avoid alcoholic drinks or consume only very small amounts while taking warfarin. Notify your health care provider if you change your alcohol intake.  Notify your dentist or other health care providers before procedures.  Eat a diet that includes 5 or more servings of fruits and vegetables each day. This may reduce the risk of stroke. Certain diets may be prescribed to address high blood pressure, high cholesterol, diabetes, or obesity.  A diet low in sodium, saturated fat, trans fat, and cholesterol is recommended to manage high blood pressure.  A diet low in saturated fat, trans fat, and cholesterol, and high in fiber may control cholesterol levels.  A controlled-carbohydrate, controlled-sugar diet is recommended to manage diabetes.  A reduced-calorie diet that is low in sodium, saturated fat, trans fat, and cholesterol is recommended to manage obesity.  Maintain a healthy weight.  Stay physically active. It is recommended that you get at least 30 minutes of activity on most or all days.  Do not use any tobacco products, including cigarettes, chewing tobacco, or electronic cigarettes. If you need help quitting, ask your health care provider.  Limit alcohol intake to no more than 1 drink per day for nonpregnant women and 2 drinks per day for men. One drink equals 12 ounces of beer, 5  ounces of wine, or 1 ounces of hard liquor.  Do not abuse drugs.  A safe home environment is important to reduce the risk of falls. Your health care provider may arrange for specialists to evaluate your home. Having grab bars in the bedroom and bathroom is often important. Your health care provider may arrange for equipment to be used at home, such as raised toilets and a seat for the shower.  Follow all instructions for follow-up with your health care provider. This is very important. This includes any referrals and lab tests. Proper follow-up  can prevent a stroke or another TIA from occurring. PREVENTION  The risk of a TIA can be decreased by appropriately treating high blood pressure, high cholesterol, diabetes, heart disease, and obesity, and by quitting smoking, limiting alcohol, and staying physically active. SEEK MEDICAL CARE IF:  You have personality changes.  You have difficulty swallowing.  You are seeing double.  You have dizziness.  You have a fever. SEEK IMMEDIATE MEDICAL CARE IF:  Any of the following symptoms may represent a serious problem that is an emergency. Do not wait to see if the symptoms will go away. Get medical help right away. Call your local emergency services (911 in [place] Do not drive yourself to the hospital.  You have sudden weakness or numbness of the face, arm, or leg, especially on one side of the body.  You have sudden trouble walking or difficulty moving arms or legs.  You have sudden confusion.  You have trouble speaking (aphasia) or understanding.  You have sudden trouble seeing in one or both eyes.  You have a loss of balance or coordination.  You have a sudden, severe headache with no known cause.  You have new chest pain or an irregular heartbeat.  You have a partial or total loss of consciousness. MAKE SURE YOU:   Understand these instructions.  Will watch your condition.  Will get help right away if you are not doing well or  get worse.   This information is not intended to replace advice given to you by your health care provider. Make sure you discuss any questions you have with your health care provider.   Document Released: 02/03/2005 Document Revised: 05/17/2014 Document Reviewed: 08/01/2013 Elsevier Interactive Patient Education [employer].

## 2015-06-14 NOTE — Discharge Summary (Signed)
Cassidy Ellison, is a 82 y.o. female  DOB 1922-04-01  MRN [id number].  Admission date:  06/13/2015  Admitting Physician  Toy Baker, MD  Discharge Date:  06/14/2015   Primary MD  Antony Blackbird, MD  Recommendations for primary care physician for things to follow:   Monitor secondary risk factors for stroke   Admission Diagnosis  Transient cerebral ischemia, unspecified transient cerebral ischemia type [G45.9]   Discharge Diagnosis  Transient cerebral ischemia, unspecified transient cerebral ischemia type [G45.9]     Active Problems:   Essential hypertension   Acute encephalopathy   TIA (transient ischemic attack)      Past Medical History  Diagnosis Date  . Hypertension   . GERD (gastroesophageal reflux disease)   . Carpal tunnel syndrome     left  . DVT (deep venous thrombosis) ([place])   . RLS (restless legs syndrome)     Past Surgical History  Procedure Laterality Date  . Abdominal hysterectomy    . Gallbladder surgery    . Carpal tunnel release Left 10/31/2014    Procedure: LEFT CARPAL TUNNEL RELEASE;  Surgeon: Leanora Cover, MD;  Location: [place];  Service: Orthopedics;  Laterality: Left;       HPI  from the history and physical done on the day of admission:    Cassidy Ellison is a 82 y.o. female  has a past medical history of Hypertension; GERD (gastroesophageal reflux disease); Carpal tunnel syndrome; DVT (deep venous thrombosis) ([facility]); and RLS (restless legs syndrome).   Presented with sudden onset of confusion while she was reading. Patient currently resides at [facility] independent facility after recent discharge. She was in her room sometime after lunch she suddenly became very confused and was unable to do things that she usually able to such as use her phone she had  difficulty understanding where she was. Patient states she remembers being a bit confused she was trying to set up her "electronic device" at that time. She was trying to text her family but her thoghts appeared confused. Daughter is a Marine scientist at [employer] was able to assess her and stated there was no slurred speech at the time. NO weakness on one side, no trouble walking. Her symptoms resolved 30 min after daughter has arrived EMS was called patient was transferred to [facility] emergency department.  IN ER: Neurology consult has been called Dr. Katherine Roan have seen the patient and recommends admission to [facility] for TIA workup. UA negative for UTI. CT unremarkable. CXR COPD but not acute findings   Regarding pertinent past history: Patient is been recently admitted for hyponatremia and was discharged on January 14 At that time her sodium was down to 112 on admission today his sodium is him up to 135 during her admission MRI was done which was unremarkable TSH was unremarkable at that time she did test positive for influenza and was titrated to Tamiflu due to generalized weakness she was discharged to nursing home [facility] for 2 weeks and no  back to independent living at [facility].  Hospitalist was called for admission for TIA workup     Hospital Course:     1. Acute ischemic right cerebral CVA. Positive on MRI, recent echogram and carotid duplex done this admission both stable, A1c stable, EEG unremarkable, LDL above goal and she was placed on low-dose statin based on her age, seen by neurology, all symptoms have resolved. Increased aspirin to full dose. Continue statin at present dose which was prescribed. Seen by PT-OT and speech, home health PT ordered as well. Discharged to ALF. Discussed with family.  2. Essential hypertension. Stable on home regimen of beta blocker.  3. GERD. Home dose PPI continue.    Discharge Condition: Stable  Follow UP  Follow-up Information     Follow up with FULP, CAMMIE, MD. Schedule an appointment as soon as possible for a visit in 1 week.   Specialty:  Family Medicine   Contact information:   [address] [place] [address] [phone]       Follow up with Xu,Jindong, MD. Schedule an appointment as soon as possible for a visit in 2 weeks.   Specialty:  Neurology   Contact information:   [address] [place] [address] [phone]        Consults obtained - Neuro  Diet and Activity recommendation: See Discharge Instructions below  Discharge Instructions           Discharge Instructions    Diet - low sodium heart healthy    Complete by:  As directed      Discharge instructions    Complete by:  As directed   Echo pending.  Follow with Primary MD FULP, CAMMIE, MD in 7 days   Get CBC, CMP, 2 view Chest X ray checked  by Primary MD next visit.    Activity: As tolerated with Full fall precautions use walker/cane & assistance as needed   Disposition ALF   Diet:   Heart Healthy   with feeding assistance and aspiration precautions.  For Heart failure patients - Check your Weight same time everyday, if you gain over 2 pounds, or you develop in leg swelling, experience more shortness of breath or chest pain, call your Primary MD immediately. Follow Cardiac Low Salt Diet and 1.5 lit/day fluid restriction.   On your next visit with your primary care physician please Get Medicines reviewed and adjusted.   Please request your Prim.MD to go over all Hospital Tests and Procedure/Radiological results at the follow up, please get all Hospital records sent to your Prim MD by signing hospital release before you go home.   If you experience worsening of your admission symptoms, develop shortness of breath, life threatening emergency, suicidal or homicidal thoughts you must seek medical attention immediately by calling 911 or calling your MD immediately  if symptoms less severe.  You Must read  complete instructions/literature along with all the possible adverse reactions/side effects for all the Medicines you take and that have been prescribed to you. Take any new Medicines after you have completely understood and accpet all the possible adverse reactions/side effects.   Do not drive, operating heavy machinery, perform activities at heights, swimming or participation in water activities or provide baby sitting services if your were admitted for syncope or siezures until you have seen by Primary MD or a Neurologist and advised to do so again.  Do not drive when taking Pain medications.    Do not take more than  prescribed Pain, Sleep and Anxiety Medications  Special Instructions: If you have smoked or chewed Tobacco  in the last 2 yrs please stop smoking, stop any regular Alcohol  and or any Recreational drug use.  Wear Seat belts while driving.   Please note  You were cared for by a hospitalist during your hospital stay. If you have any questions about your discharge medications or the care you received while you were in the hospital after you are discharged, you can call the unit and asked to speak with the hospitalist on call if the hospitalist that took care of you is not available. Once you are discharged, your primary care physician will handle any further medical issues. Please note that NO REFILLS for any discharge medications will be authorized once you are discharged, as it is imperative that you return to your primary care physician (or establish a relationship with a primary care physician if you do not have one) for your aftercare needs so that they can reassess your need for medications and monitor your lab values.     Increase activity slowly    Complete by:  As directed              Discharge Medications       Medication List    TAKE these medications        amLODipine 10 MG tablet  Commonly known as:  NORVASC  Take 10 mg by mouth daily.     aspirin 325  MG tablet  Take 1 tablet (325 mg total) by mouth daily.     atorvastatin 20 MG tablet  Commonly known as:  LIPITOR  Take 1 tablet (20 mg total) by mouth daily at 6 PM.     bisacodyl 5 MG EC tablet  Commonly known as:  DULCOLAX  Take 5-15 mg by mouth daily as needed for moderate constipation.     CALCIUM 1200 PO  Take 1,200 mg by mouth daily.     Dextromethorphan-Guaifenesin 5-100 MG/5ML Liqd  Take 5 mLs by mouth every 4 (four) hours as needed (cough).     fish oil-omega-3 fatty acids 1000 MG capsule  Take 2,000 mg by mouth daily.     losartan 100 MG tablet  Commonly known as:  COZAAR  Take 100 mg by mouth daily.     naproxen sodium 220 MG tablet  Commonly known as:  ANAPROX  Take 440 mg by mouth daily as needed (pain).     omeprazole 20 MG capsule  Commonly known as:  PRILOSEC  Take 20 mg by mouth daily.     ondansetron 4 MG disintegrating tablet  Commonly known as:  ZOFRAN ODT  Take 1 tablet (4 mg total) by mouth every 8 (eight) hours as needed for nausea or vomiting.     ONE DAILY MULTIPLE VITAMIN PO  Take by mouth.     polyethylene glycol packet  Commonly known as:  MIRALAX / GLYCOLAX  Take 17 g by mouth daily as needed for mild constipation.     potassium chloride 10 MEQ tablet  Commonly known as:  K-DUR,KLOR-CON  Take 10 mEq by mouth daily.     RED YEAST RICE EXTRACT PO  Take 1,200 mg by mouth daily.     rOPINIRole 0.25 MG tablet  Commonly known as:  REQUIP  Take 1 tablet (0.25 mg total) by mouth at bedtime.     sodium chloride 1 g tablet  Take 2 tablets (2 g total) by mouth 2 (  two) times daily with a meal.     TOPROL XL 200 MG 24 hr tablet  Generic drug:  metoprolol  Take 100 mg by mouth daily.     triamcinolone cream 0.1 %  Commonly known as:  KENALOG  Apply 1 application topically 2 (two) times daily as needed (itch).     vitamin C 500 MG tablet  Commonly known as:  ASCORBIC ACID  Take 500 mg by mouth daily.     Vitamin D-3 1000 units Caps    Take 1,000 Units by mouth daily.        Major procedures and Radiology Reports - PLEASE review detailed and final reports for all details, in brief -   TTE Jan 2017  Left ventricle: The cavity size was normal. Systolic function was normal. The estimated ejection fraction was in the range of 60% to 65%. Wall motion was normal; there were no regional wall motion abnormalities. Left ventricular diastolic function parameters were normal. - Atrial septum: No defect or patent foramen ovale was identified.   Carotids  Preliminary report: 1-39% ICA plaquing. Vertebral artery flow is antegrade.    EEG This normal EEG is recorded in the waking and sleep state. There was no seizure or seizure predisposition recorded on this study. Please note that a normal EEG does not preclude the possibility of epilepsy.     Dg Chest 2 View  06/13/2015  CLINICAL DATA:  Can't onset of confusion while reading, cough a few weeks ago, controlled hypertension, former smoker, GERD EXAM: CHEST  2 VIEW COMPARISON:  05/15/2015 FINDINGS: Normal heart size and pulmonary vascularity. Calcified tortuous thoracic aorta. Lungs appear hyperinflated but clear. No pleural effusion or pneumothorax. Osseous demineralization with scattered costal cartilaginous calcifications noted. IMPRESSION: Probable COPD changes. No acute abnormalities. Electronically Signed   By: Lavonia Dana M.D.   On: 06/13/2015 17:47   Dg Chest 2 View  05/15/2015  CLINICAL DATA:  Increasing weakness, confusion and anorexia for 5 days, diarrhea, and productive cough for 1 day, recent admission for dehydration, hyponatremia, and UTI, history hypertension, former smoker EXAM: CHEST  2 VIEW COMPARISON:  05/11/2015 FINDINGS: Upper normal heart size. Calcified tortuous aorta. Mediastinal contours and pulmonary vascularity normal. Lungs mildly hyperinflated but clear. No pleural effusion or pneumothorax. Bones demineralized. IMPRESSION: Mild hyperinflation  without acute infiltrate. Electronically Signed   By: Lavonia Dana M.D.   On: 05/15/2015 18:00   Ct Head Wo Contrast  06/13/2015  CLINICAL DATA:  Patient with confusion.  Results symptoms. EXAM: CT HEAD WITHOUT CONTRAST TECHNIQUE: Contiguous axial images were obtained from the base of the skull through the vertex without intravenous contrast. COMPARISON:  Brain MRI 05/16/2015 FINDINGS: Ventricles and sulci are appropriate for patient's age. Periventricular and subcortical white matter hypodensity compatible with microvascular ischemic changes. No evidence for acute cortically based infarct, intracranial hemorrhage, mass lesion or mass-effect. Orbits are unremarkable. Paranasal sinuses are well aerated. Mastoid air cells are unremarkable. Calvarium is intact. IMPRESSION: No acute intracranial process. Chronic microvascular ischemic changes. Electronically Signed   By: Lovey Newcomer M.D.   On: 06/13/2015 18:19   Ct Angio Chest Pe W/cm &/or Wo Cm  05/15/2015  CLINICAL DATA:  Increasing weakness, confusion and anorexia for 5 days, diarrhea and productive cough beginning today, recent dehydration, hyponatremia and UTI, hypertension, question pulmonary embolism EXAM: CT ANGIOGRAPHY CHEST WITH CONTRAST TECHNIQUE: Multidetector CT imaging of the chest was performed using the standard protocol during bolus administration of intravenous contrast. Multiplanar CT image reconstructions and  MIPs were obtained to evaluate the vascular anatomy. CONTRAST:  136m OMNIPAQUE IOHEXOL 350 MG/ML SOLN IV COMPARISON:  None. FINDINGS: Scattered atherosclerotic calcifications aorta, coronary arteries and proximal great vessels. Aorta normal caliber without aneurysm or dissection. Minimal pericardial effusion. Pulmonary arteries well opacified and patent. No evidence of pulmonary embolism. No thoracic adenopathy. Slight thickening in the adrenal glands without discrete mass. Remaining visualized upper abdomen unremarkable. Bones demineralized  with scattered degenerative disc disease changes of thoracic spine. Peripheral interstitial thickening question fibrosis in RIGHT lower lobe. Central peribronchial thickening and minimal upper lobe emphysematous changes. Small focus of ground-glass opacity in the RIGHT upper lobe 10 mm diameter image 16. 2 mm RIGHT upper lobe subpleural nodule image 27. Remaining lungs clear. No pleural effusion or pneumothorax. Review of the MIP images confirms the above findings. IMPRESSION: No evidence of pulmonary embolism. Scattered atherosclerotic disease. Minimal pericardial fluid. Bronchitic and minimal emphysematous changes. Small focus of ground-glass opacity in RIGHT upper lobe 10 mm diameter ; initial follow-up by chest CT without contrast is recommended in 3 months to confirm persistence, if clinically indicated. This recommendation follows the consensus statement: Recommendations for the Management of Subsolid Pulmonary Nodules Detected at CT: A Statement from the [employer] published in Radiology 2013; 266:304-317. Electronically Signed   By: MLavonia DanaM.D.   On: 05/15/2015 19:55   Mr Brain Wo Contrast  06/14/2015  CLINICAL DATA:  Acute onset confusion while reading today. History of hypertension. EXAM: MRI HEAD WITHOUT CONTRAST MRA HEAD WITHOUT CONTRAST TECHNIQUE: Multiplanar, multiecho pulse sequences of the brain and surrounding structures were obtained without intravenous contrast. Angiographic images of the head were obtained using MRA technique without contrast. COMPARISON:  CT head June 13, 2015 and MRI head May 16, 2015 FINDINGS: MRI HEAD FINDINGS The ventricles and sulci are normal for patient's age. No abnormal parenchymal signal, mass lesions, mass effect. Patchy supratentorial and pontine white matter FLAIR T2 hyperintensities. Punctate focus of reduced diffusion RIGHT cerebral peduncle, axial 19/ 98. No susceptibility artifact to suggest hemorrhage. No abnormal extra-axial fluid  collections. No extra-axial masses though, contrast enhanced sequences would be more sensitive. Normal major intracranial vascular flow voids seen at the skull base. Ocular globes and orbital contents are normal though not tailored for evaluation, status post bilateral ocular lens implants. No abnormal sellar expansion. No suspicious calvarial bone marrow signal. Craniocervical junction maintained. Visualized paranasal sinuses and mastoid air cells are well-aerated. MRA HEAD FINDINGS Anterior circulation: Normal flow related enhancement of the included cervical, petrous, cavernous and supraclinoid internal carotid arteries. Patent anterior communicating artery. Normal flow related enhancement of the anterior and middle cerebral arteries, including distal segments. 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 No large vessel occlusion, high-grade stenosis, abnormal luminal irregularity. Posterior circulation: Codominant vertebral arteries. Basilar artery is patent, with normal flow related enhancement of the main branch vessels. 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 Normal flow related enhancement of the posterior cerebral arteries. No large vessel occlusion, high-grade stenosis, abnormal luminal irregularity. IMPRESSION: MRI HEAD: Punctate focus of acute ischemia RIGHT cerebral peduncle versus artifact. Otherwise stable appearance of the brain from May 16, 2015 including involutional changes and mild to moderate chronic small vessel ischemic disease. MRA HEAD: No acute large vessel occlusion or high-grade stenosis. 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 Electronically Signed   By: CElon AlasM.D.   On: 06/14/2015 04:03   Mr BJeri CosWWLContrast  05/16/2015  CLINICAL DATA:  Hyponatremia. Increasing confusion and weakness. Anorexia. Diarrhea and productive cough. EXAM: MRI HEAD WITHOUT AND WITH CONTRAST TECHNIQUE: Multiplanar, multiecho pulse sequences of the  brain and surrounding structures were obtained without and with intravenous contrast. CONTRAST:  45m MULTIHANCE GADOBENATE DIMEGLUMINE 529 MG/ML IV SOLN COMPARISON:  None. FINDINGS: There is no evidence of acute infarct, intracranial hemorrhage, mass, midline shift, or extra-axial fluid collection. There is moderate cerebral atrophy. Small foci of T2 hyperintensity scattered throughout the cerebral white matter bilaterally are nonspecific but compatible with mild chronic small vessel ischemic disease. Minimal T2 hyperintensity in the pons likely reflects chronic small vessel ischemia, without evidence of osmotic demyelination. No abnormal enhancement is identified. Prior bilateral cataract extraction is noted. No significant inflammatory disease is seen in the paranasal sinuses or mastoid air cells. The Major intracranial vascular flow voids are preserved. IMPRESSION: 1. No acute intracranial abnormality. 2. Mild chronic small vessel ischemic disease and moderate cerebral atrophy. Electronically Signed   By: ALogan BoresM.D.   On: 05/16/2015 15:27   Ct Abdomen Pelvis W Contrast  05/16/2015  CLINICAL DATA:  Hospitalized January 1st through May 13, 2015 for urinary tract infection and viral gastritis, persistent hyponatremia EXAM: CT ABDOMEN AND PELVIS WITH CONTRAST TECHNIQUE: Multidetector CT imaging of the abdomen and pelvis was performed using the standard protocol following bolus administration of intravenous contrast. CONTRAST:  1036mOMNIPAQUE IOHEXOL 300 MG/ML  SOLN COMPARISON:  None. FINDINGS: Lower chest:  No acute findings Hepatobiliary: Status post cholecystectomy. Mild intrahepatic biliary dilatation and mild to moderate extrahepatic biliary dilatation likely related to prior cholecystectomy. Pancreas: Negative Spleen: Negative Adrenals/Urinary Tract: 19 mm right adrenal nodule. 13 mm left adrenal nodule. Calcification of inferior aspect of left adrenal gland. Sub cm low-attenuation round and oval  lesions involving both kidneys too small to characterize, most consistent with cysts. Minimal perinephric fluid/ inflammation on the left possibly related to pyelonephritis as stated in clinical indication. No hydronephrosis. Bladder contains relatively hyper attenuating fluid, likely residual excreted contrast from May 15, 2015 CT thorax examination. Stomach/Bowel: Stomach appear normal. Small bowel appears normal. There is moderate diverticulosis of the sigmoid colon. There is trace fluid in the inferior pelvis in the region of the sigmoid colon but there is no evidence of inflammatory change. There is mild diverticulosis of the descending colon as well. Vascular/Lymphatic: Moderate atherosclerotic aortoiliac calcification Reproductive: Reproductive organs not identified presumably surgically absent. Other: As stated above, trace free fluid in the dependent inferior pelvis. Musculoskeletal: No acute musculoskeletal findings IMPRESSION: No specific acute findings. Minimal perinephric fluid or inflammation on the left could reflect recent pyelonephritis. Mild biliary dilatation likely related to status post cholecystectomy. Sigmoid diverticulosis without specific evidence of diverticulitis. Trace free fluid in the inferior pelvis is nonspecific. Electronically Signed   By: RaSkipper Cliche.D.   On: 05/16/2015 18:45   Mr MrJodene Namead/brain Wo Cm  06/14/2015  CLINICAL DATA:  Acute onset confusion while reading today. History of hypertension. EXAM: MRI HEAD WITHOUT CONTRAST MRA HEAD WITHOUT CONTRAST TECHNIQUE: Multiplanar, multiecho pulse sequences of the brain and surrounding structures were obtained without intravenous contrast. Angiographic images of the head were obtained using MRA technique without contrast. COMPARISON:  CT head June 13, 2015 and MRI head May 16, 2015 FINDINGS: MRI HEAD FINDINGS The ventricles and sulci are normal for patient's age. No abnormal parenchymal signal, mass lesions, mass  effect. Patchy supratentorial and pontine white matter FLAIR T2 hyperintensities. Punctate focus of reduced diffusion RIGHT cerebral peduncle, axial 19/ 98. No susceptibility  artifact to suggest hemorrhage. No abnormal extra-axial fluid collections. No extra-axial masses though, contrast enhanced sequences would be more sensitive. Normal major intracranial vascular flow voids seen at the skull base. Ocular globes and orbital contents are normal though not tailored for evaluation, status post bilateral ocular lens implants. No abnormal sellar expansion. No suspicious calvarial bone marrow signal. Craniocervical junction maintained. Visualized paranasal sinuses and mastoid air cells are well-aerated. MRA HEAD FINDINGS Anterior circulation: Normal flow related enhancement of the included cervical, petrous, cavernous and supraclinoid internal carotid arteries. Patent anterior communicating artery. Normal flow related enhancement of the anterior and middle cerebral arteries, including distal segments. 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 No large vessel occlusion, high-grade stenosis, abnormal luminal irregularity. Posterior circulation: Codominant vertebral arteries. Basilar artery is patent, with normal flow related enhancement of the main branch vessels. 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 Normal flow related enhancement of the posterior cerebral arteries. No large vessel occlusion, high-grade stenosis, abnormal luminal irregularity. IMPRESSION: MRI HEAD: Punctate focus of acute ischemia RIGHT cerebral peduncle versus artifact. Otherwise stable appearance of the brain from May 16, 2015 including involutional changes and mild to moderate chronic small vessel ischemic disease. MRA HEAD: No acute large vessel occlusion or high-grade stenosis. 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 Electronically Signed   By: Elon Alas M.D.   On: 06/14/2015 04:03     Micro Results      No results found for this or any previous visit (from the past 240 hour(s)).     Today   Subjective    Norma Fredrickson today has no headache,no chest abdominal pain,no new weakness tingling or numbness, feels much better wants to go home today.     Objective   Blood pressure 145/82, pulse 68, temperature 98.4 F (36.9 C), temperature source Oral, resp. rate 18, height '5\' 5"'$  (1.651 m), weight 59.603 kg (131 lb 6.4 oz), SpO2 98 %.  No intake or output data in the 24 hours ending 06/14/15 1251  Exam Awake Alert, Oriented x 3, No new F.N deficits, Normal affect [place].AT,PERRAL Supple Neck,No JVD, No cervical lymphadenopathy appriciated.  Symmetrical Chest wall movement, Good air movement bilaterally, CTAB RRR,No Gallops,Rubs or new Murmurs, No Parasternal Heave +ve B.Sounds, Abd Soft, Non tender, No organomegaly appriciated, No rebound -guarding or rigidity. No Cyanosis, Clubbing or edema, No new Rash or bruise   Data Review   CBC w Diff:  Lab Results  Component Value Date   WBC 5.8 06/13/2015   HGB 12.9 06/13/2015   HCT 38.0 06/13/2015   PLT 251 06/13/2015   LYMPHOPCT 27 06/13/2015   MONOPCT 10 06/13/2015   EOSPCT 7 06/13/2015   BASOPCT 0 06/13/2015    CMP:  Lab Results  Component Value Date   NA 135 06/13/2015   K 4.1 06/13/2015   CL 101 06/13/2015   CO2 24 06/13/2015   BUN 19 06/13/2015   CREATININE 0.70 06/13/2015   PROT 6.9 06/13/2015   ALBUMIN 3.7 06/13/2015   BILITOT 0.6 06/13/2015   ALKPHOS 64 06/13/2015   AST 21 06/13/2015   ALT 18 06/13/2015   Lab Results  Component Value Date   CHOL 189 06/14/2015   HDL 53 06/14/2015   LDLCALC 122* 06/14/2015   TRIG 68 06/14/2015   CHOLHDL 3.6 06/14/2015    No results found for: HGBA1C   Total Time in preparing paper work, data evaluation and todays exam - 35 minutes  SINGH,PRASHANT K  M.D on 06/14/2015 at 12:51 PM  [facility]   Office  [phone]

## 2015-06-16 LAB — URINE CULTURE

## 2015-06-16 LAB — HEMOGLOBIN A1C
Hgb A1c MFr Bld: 6 % — ABNORMAL HIGH (ref 4.8–5.6)
MEAN PLASMA GLUCOSE: 126 mg/dL

## 2015-06-16 NOTE — Progress Notes (Signed)
Occupational Therapy Evaluation Addendum:    06-29-15 1400  OT G-codes **NOT FOR INPATIENT CLASS**  Functional Limitation Self care  Self Care Current Status [phone]) CI  Self Care Goal Status (Q3374) CI  Self Care Discharge Status [phone]) CI  Lucille Passy, OTR/L [phone]

## 2015-06-17 DIAGNOSIS — Z79899 Other long term (current) drug therapy: Secondary | ICD-10-CM | POA: Diagnosis not present

## 2015-06-17 DIAGNOSIS — R531 Weakness: Secondary | ICD-10-CM | POA: Diagnosis not present

## 2015-06-17 DIAGNOSIS — R2681 Unsteadiness on feet: Secondary | ICD-10-CM | POA: Diagnosis not present

## 2015-06-17 DIAGNOSIS — G459 Transient cerebral ischemic attack, unspecified: Secondary | ICD-10-CM | POA: Diagnosis not present

## 2015-06-17 DIAGNOSIS — Z8701 Personal history of pneumonia (recurrent): Secondary | ICD-10-CM | POA: Diagnosis not present

## 2015-06-17 DIAGNOSIS — E871 Hypo-osmolality and hyponatremia: Secondary | ICD-10-CM | POA: Diagnosis not present

## 2015-06-17 DIAGNOSIS — Z7982 Long term (current) use of aspirin: Secondary | ICD-10-CM | POA: Diagnosis not present

## 2015-06-17 DIAGNOSIS — Z87440 Personal history of urinary (tract) infections: Secondary | ICD-10-CM | POA: Diagnosis not present

## 2015-06-17 DIAGNOSIS — L03115 Cellulitis of right lower limb: Secondary | ICD-10-CM | POA: Diagnosis not present

## 2015-06-17 DIAGNOSIS — I10 Essential (primary) hypertension: Secondary | ICD-10-CM | POA: Diagnosis not present

## 2015-06-17 DIAGNOSIS — G2581 Restless legs syndrome: Secondary | ICD-10-CM | POA: Diagnosis not present

## 2015-06-17 DIAGNOSIS — R938 Abnormal findings on diagnostic imaging of other specified body structures: Secondary | ICD-10-CM | POA: Diagnosis not present

## 2015-06-20 DIAGNOSIS — R2681 Unsteadiness on feet: Secondary | ICD-10-CM | POA: Diagnosis not present

## 2015-06-20 DIAGNOSIS — Z87440 Personal history of urinary (tract) infections: Secondary | ICD-10-CM | POA: Diagnosis not present

## 2015-06-20 DIAGNOSIS — Z8701 Personal history of pneumonia (recurrent): Secondary | ICD-10-CM | POA: Diagnosis not present

## 2015-06-20 DIAGNOSIS — I10 Essential (primary) hypertension: Secondary | ICD-10-CM | POA: Diagnosis not present

## 2015-06-20 DIAGNOSIS — G2581 Restless legs syndrome: Secondary | ICD-10-CM | POA: Diagnosis not present

## 2015-06-20 DIAGNOSIS — R531 Weakness: Secondary | ICD-10-CM | POA: Diagnosis not present

## 2015-06-23 DIAGNOSIS — Z7982 Long term (current) use of aspirin: Secondary | ICD-10-CM | POA: Diagnosis not present

## 2015-06-24 DIAGNOSIS — R531 Weakness: Secondary | ICD-10-CM | POA: Diagnosis not present

## 2015-06-24 DIAGNOSIS — I10 Essential (primary) hypertension: Secondary | ICD-10-CM | POA: Diagnosis not present

## 2015-06-24 DIAGNOSIS — R2681 Unsteadiness on feet: Secondary | ICD-10-CM | POA: Diagnosis not present

## 2015-06-24 DIAGNOSIS — Z87440 Personal history of urinary (tract) infections: Secondary | ICD-10-CM | POA: Diagnosis not present

## 2015-06-24 DIAGNOSIS — Z8701 Personal history of pneumonia (recurrent): Secondary | ICD-10-CM | POA: Diagnosis not present

## 2015-06-24 DIAGNOSIS — G2581 Restless legs syndrome: Secondary | ICD-10-CM | POA: Diagnosis not present

## 2015-06-27 DIAGNOSIS — R531 Weakness: Secondary | ICD-10-CM | POA: Diagnosis not present

## 2015-06-27 DIAGNOSIS — R2681 Unsteadiness on feet: Secondary | ICD-10-CM | POA: Diagnosis not present

## 2015-06-27 DIAGNOSIS — Z87440 Personal history of urinary (tract) infections: Secondary | ICD-10-CM | POA: Diagnosis not present

## 2015-06-27 DIAGNOSIS — I10 Essential (primary) hypertension: Secondary | ICD-10-CM | POA: Diagnosis not present

## 2015-06-27 DIAGNOSIS — G2581 Restless legs syndrome: Secondary | ICD-10-CM | POA: Diagnosis not present

## 2015-06-27 DIAGNOSIS — Z8701 Personal history of pneumonia (recurrent): Secondary | ICD-10-CM | POA: Diagnosis not present

## 2015-07-01 DIAGNOSIS — I10 Essential (primary) hypertension: Secondary | ICD-10-CM | POA: Diagnosis not present

## 2015-07-01 DIAGNOSIS — Z8701 Personal history of pneumonia (recurrent): Secondary | ICD-10-CM | POA: Diagnosis not present

## 2015-07-01 DIAGNOSIS — R2681 Unsteadiness on feet: Secondary | ICD-10-CM | POA: Diagnosis not present

## 2015-07-01 DIAGNOSIS — R531 Weakness: Secondary | ICD-10-CM | POA: Diagnosis not present

## 2015-07-01 DIAGNOSIS — G2581 Restless legs syndrome: Secondary | ICD-10-CM | POA: Diagnosis not present

## 2015-07-01 DIAGNOSIS — Z87440 Personal history of urinary (tract) infections: Secondary | ICD-10-CM | POA: Diagnosis not present

## 2015-07-04 ENCOUNTER — Encounter: Payer: Self-pay | Admitting: Podiatry

## 2015-07-04 ENCOUNTER — Ambulatory Visit (INDEPENDENT_AMBULATORY_CARE_PROVIDER_SITE_OTHER): Payer: Medicare Other

## 2015-07-04 ENCOUNTER — Ambulatory Visit (INDEPENDENT_AMBULATORY_CARE_PROVIDER_SITE_OTHER): Payer: Medicare Other | Admitting: Podiatry

## 2015-07-04 DIAGNOSIS — S92301A Fracture of unspecified metatarsal bone(s), right foot, initial encounter for closed fracture: Secondary | ICD-10-CM

## 2015-07-04 DIAGNOSIS — I10 Essential (primary) hypertension: Secondary | ICD-10-CM | POA: Diagnosis not present

## 2015-07-04 DIAGNOSIS — B351 Tinea unguium: Secondary | ICD-10-CM | POA: Diagnosis not present

## 2015-07-04 DIAGNOSIS — G2581 Restless legs syndrome: Secondary | ICD-10-CM | POA: Diagnosis not present

## 2015-07-04 DIAGNOSIS — R52 Pain, unspecified: Secondary | ICD-10-CM | POA: Diagnosis not present

## 2015-07-04 DIAGNOSIS — Z87440 Personal history of urinary (tract) infections: Secondary | ICD-10-CM | POA: Diagnosis not present

## 2015-07-04 DIAGNOSIS — Z8701 Personal history of pneumonia (recurrent): Secondary | ICD-10-CM | POA: Diagnosis not present

## 2015-07-04 DIAGNOSIS — M79676 Pain in unspecified toe(s): Secondary | ICD-10-CM | POA: Diagnosis not present

## 2015-07-04 DIAGNOSIS — M7741 Metatarsalgia, right foot: Secondary | ICD-10-CM

## 2015-07-04 DIAGNOSIS — R531 Weakness: Secondary | ICD-10-CM | POA: Diagnosis not present

## 2015-07-04 DIAGNOSIS — R2681 Unsteadiness on feet: Secondary | ICD-10-CM | POA: Diagnosis not present

## 2015-07-04 NOTE — Progress Notes (Signed)
Patient ID: Cassidy Ellison, female   DOB: 02-24-1922, 82 y.o.   MRN: [id number] Complaint:  Visit Type: Patient returns to my office for continued preventative foot care services. Complaint: Patient states" my nails have grown long and thick and become painful to walk and wear shoes". The patient presents for preventative foot care services. No changes to ROS.  She says she twisted her right foot four weeks ago.  She has been experiencing pain when she wakes in the morning but the pain subsides in shoes.  She has swollen painful metatarsals 3,4,5 right foot.  Podiatric Exam: Vascular: dorsalis pedis and posterior tibial pulses are palpable bilateral. Capillary return is immediate. Temperature gradient is WNL. Skin turgor WNL  Sensorium: Normal Semmes Weinstein monofilament test. Normal tactile sensation bilaterally. Nail Exam: Pt has thick disfigured discolored nails with subungual debris noted bilateral entire nail hallux through fifth toenails Ulcer Exam: There is no evidence of ulcer or pre-ulcerative changes or infection. Orthopedic Exam: Muscle tone and strength are WNL. No limitations in general ROM. No crepitus or effusions noted. Foot type and digits show no abnormalities. Bony prominences are unremarkable. HAV 1st MPJ B/L. Swelling and pain through 3,4 and 5th metatarsal right foot. Skin: No Porokeratosis. No infection or ulcers.  .  Diagnosis:  Onychomycosis, , Pain in right toe, pain in left toes, Closed fifth metatarsal fracture right foot.  Treatment & Plan Procedures and Treatment: Consent by patient was obtained for treatment procedures. The patient understood the discussion of treatment and procedures well. All questions were answered thoroughly reviewed. Debridement of mycotic and hypertrophic toenails, 1 through 5 bilateral and clearing of subungual debris. No ulceration, no infection noted. X-ray reveals base fifth metatarsal fracture right foot. Dispense compression sock.  She has  limited pain so I will not dispense surgical shoe. I cannot be sure this was caused by her foot injury four weeks ago.  Return Visit-Office Procedure: Patient instructed to return to the office for a follow up visit 3 months for continued evaluation and treatment.    Gardiner Barefoot DPM

## 2015-07-08 DIAGNOSIS — M79671 Pain in right foot: Secondary | ICD-10-CM | POA: Diagnosis not present

## 2015-07-08 DIAGNOSIS — R262 Difficulty in walking, not elsewhere classified: Secondary | ICD-10-CM | POA: Diagnosis not present

## 2015-07-08 DIAGNOSIS — R278 Other lack of coordination: Secondary | ICD-10-CM | POA: Diagnosis not present

## 2015-07-08 DIAGNOSIS — M6281 Muscle weakness (generalized): Secondary | ICD-10-CM | POA: Diagnosis not present

## 2015-07-09 ENCOUNTER — Other Ambulatory Visit: Payer: Self-pay | Admitting: Family Medicine

## 2015-07-09 ENCOUNTER — Other Ambulatory Visit: Payer: Self-pay

## 2015-07-09 DIAGNOSIS — Z1231 Encounter for screening mammogram for malignant neoplasm of breast: Secondary | ICD-10-CM

## 2015-07-09 DIAGNOSIS — M8580 Other specified disorders of bone density and structure, unspecified site: Secondary | ICD-10-CM

## 2015-07-10 DIAGNOSIS — M6281 Muscle weakness (generalized): Secondary | ICD-10-CM | POA: Diagnosis not present

## 2015-07-10 DIAGNOSIS — M79671 Pain in right foot: Secondary | ICD-10-CM | POA: Diagnosis not present

## 2015-07-10 DIAGNOSIS — R262 Difficulty in walking, not elsewhere classified: Secondary | ICD-10-CM | POA: Diagnosis not present

## 2015-07-10 DIAGNOSIS — R278 Other lack of coordination: Secondary | ICD-10-CM | POA: Diagnosis not present

## 2015-07-11 DIAGNOSIS — M6281 Muscle weakness (generalized): Secondary | ICD-10-CM | POA: Diagnosis not present

## 2015-07-11 DIAGNOSIS — M79671 Pain in right foot: Secondary | ICD-10-CM | POA: Diagnosis not present

## 2015-07-11 DIAGNOSIS — R262 Difficulty in walking, not elsewhere classified: Secondary | ICD-10-CM | POA: Diagnosis not present

## 2015-07-11 DIAGNOSIS — R278 Other lack of coordination: Secondary | ICD-10-CM | POA: Diagnosis not present

## 2015-07-14 DIAGNOSIS — M6281 Muscle weakness (generalized): Secondary | ICD-10-CM | POA: Diagnosis not present

## 2015-07-14 DIAGNOSIS — R262 Difficulty in walking, not elsewhere classified: Secondary | ICD-10-CM | POA: Diagnosis not present

## 2015-07-14 DIAGNOSIS — R278 Other lack of coordination: Secondary | ICD-10-CM | POA: Diagnosis not present

## 2015-07-14 DIAGNOSIS — M79671 Pain in right foot: Secondary | ICD-10-CM | POA: Diagnosis not present

## 2015-07-15 DIAGNOSIS — M79671 Pain in right foot: Secondary | ICD-10-CM | POA: Diagnosis not present

## 2015-07-15 DIAGNOSIS — M6281 Muscle weakness (generalized): Secondary | ICD-10-CM | POA: Diagnosis not present

## 2015-07-15 DIAGNOSIS — R262 Difficulty in walking, not elsewhere classified: Secondary | ICD-10-CM | POA: Diagnosis not present

## 2015-07-15 DIAGNOSIS — R278 Other lack of coordination: Secondary | ICD-10-CM | POA: Diagnosis not present

## 2015-07-16 DIAGNOSIS — M79671 Pain in right foot: Secondary | ICD-10-CM | POA: Diagnosis not present

## 2015-07-16 DIAGNOSIS — E871 Hypo-osmolality and hyponatremia: Secondary | ICD-10-CM | POA: Diagnosis not present

## 2015-07-17 DIAGNOSIS — M79671 Pain in right foot: Secondary | ICD-10-CM | POA: Diagnosis not present

## 2015-07-17 DIAGNOSIS — M6281 Muscle weakness (generalized): Secondary | ICD-10-CM | POA: Diagnosis not present

## 2015-07-17 DIAGNOSIS — R278 Other lack of coordination: Secondary | ICD-10-CM | POA: Diagnosis not present

## 2015-07-17 DIAGNOSIS — R262 Difficulty in walking, not elsewhere classified: Secondary | ICD-10-CM | POA: Diagnosis not present

## 2015-07-18 DIAGNOSIS — R278 Other lack of coordination: Secondary | ICD-10-CM | POA: Diagnosis not present

## 2015-07-18 DIAGNOSIS — R262 Difficulty in walking, not elsewhere classified: Secondary | ICD-10-CM | POA: Diagnosis not present

## 2015-07-18 DIAGNOSIS — M79671 Pain in right foot: Secondary | ICD-10-CM | POA: Diagnosis not present

## 2015-07-18 DIAGNOSIS — M6281 Muscle weakness (generalized): Secondary | ICD-10-CM | POA: Diagnosis not present

## 2015-07-19 DIAGNOSIS — M6281 Muscle weakness (generalized): Secondary | ICD-10-CM | POA: Diagnosis not present

## 2015-07-19 DIAGNOSIS — R278 Other lack of coordination: Secondary | ICD-10-CM | POA: Diagnosis not present

## 2015-07-19 DIAGNOSIS — M79671 Pain in right foot: Secondary | ICD-10-CM | POA: Diagnosis not present

## 2015-07-19 DIAGNOSIS — R262 Difficulty in walking, not elsewhere classified: Secondary | ICD-10-CM | POA: Diagnosis not present

## 2015-07-22 DIAGNOSIS — M6281 Muscle weakness (generalized): Secondary | ICD-10-CM | POA: Diagnosis not present

## 2015-07-22 DIAGNOSIS — R278 Other lack of coordination: Secondary | ICD-10-CM | POA: Diagnosis not present

## 2015-07-22 DIAGNOSIS — M79671 Pain in right foot: Secondary | ICD-10-CM | POA: Diagnosis not present

## 2015-07-22 DIAGNOSIS — R262 Difficulty in walking, not elsewhere classified: Secondary | ICD-10-CM | POA: Diagnosis not present

## 2015-07-24 DIAGNOSIS — R262 Difficulty in walking, not elsewhere classified: Secondary | ICD-10-CM | POA: Diagnosis not present

## 2015-07-24 DIAGNOSIS — R278 Other lack of coordination: Secondary | ICD-10-CM | POA: Diagnosis not present

## 2015-07-24 DIAGNOSIS — M79671 Pain in right foot: Secondary | ICD-10-CM | POA: Diagnosis not present

## 2015-07-24 DIAGNOSIS — M6281 Muscle weakness (generalized): Secondary | ICD-10-CM | POA: Diagnosis not present

## 2015-07-28 ENCOUNTER — Ambulatory Visit
Admission: RE | Admit: 2015-07-28 | Discharge: 2015-07-28 | Disposition: A | Discharge status: Home / Self Care | Payer: Medicare Other | Source: Ambulatory Visit

## 2015-07-28 ENCOUNTER — Ambulatory Visit
Admission: RE | Admit: 2015-07-28 | Discharge: 2015-07-28 | Disposition: A | Discharge status: Home / Self Care | Payer: Medicare Other | Source: Ambulatory Visit | Attending: Family Medicine | Admitting: Family Medicine

## 2015-07-28 DIAGNOSIS — M8580 Other specified disorders of bone density and structure, unspecified site: Secondary | ICD-10-CM

## 2015-07-28 DIAGNOSIS — Z1231 Encounter for screening mammogram for malignant neoplasm of breast: Secondary | ICD-10-CM | POA: Diagnosis not present

## 2015-07-28 DIAGNOSIS — M8589 Other specified disorders of bone density and structure, multiple sites: Secondary | ICD-10-CM | POA: Diagnosis not present

## 2015-07-29 DIAGNOSIS — M79671 Pain in right foot: Secondary | ICD-10-CM | POA: Diagnosis not present

## 2015-07-29 DIAGNOSIS — R262 Difficulty in walking, not elsewhere classified: Secondary | ICD-10-CM | POA: Diagnosis not present

## 2015-07-29 DIAGNOSIS — R278 Other lack of coordination: Secondary | ICD-10-CM | POA: Diagnosis not present

## 2015-07-29 DIAGNOSIS — M6281 Muscle weakness (generalized): Secondary | ICD-10-CM | POA: Diagnosis not present

## 2015-07-31 DIAGNOSIS — M6281 Muscle weakness (generalized): Secondary | ICD-10-CM | POA: Diagnosis not present

## 2015-07-31 DIAGNOSIS — H1132 Conjunctival hemorrhage, left eye: Secondary | ICD-10-CM | POA: Diagnosis not present

## 2015-07-31 DIAGNOSIS — R262 Difficulty in walking, not elsewhere classified: Secondary | ICD-10-CM | POA: Diagnosis not present

## 2015-07-31 DIAGNOSIS — R278 Other lack of coordination: Secondary | ICD-10-CM | POA: Diagnosis not present

## 2015-07-31 DIAGNOSIS — M79671 Pain in right foot: Secondary | ICD-10-CM | POA: Diagnosis not present

## 2015-08-06 DIAGNOSIS — R278 Other lack of coordination: Secondary | ICD-10-CM | POA: Diagnosis not present

## 2015-08-06 DIAGNOSIS — R262 Difficulty in walking, not elsewhere classified: Secondary | ICD-10-CM | POA: Diagnosis not present

## 2015-08-06 DIAGNOSIS — M79671 Pain in right foot: Secondary | ICD-10-CM | POA: Diagnosis not present

## 2015-08-06 DIAGNOSIS — M6281 Muscle weakness (generalized): Secondary | ICD-10-CM | POA: Diagnosis not present

## 2015-08-08 DIAGNOSIS — M6281 Muscle weakness (generalized): Secondary | ICD-10-CM | POA: Diagnosis not present

## 2015-08-08 DIAGNOSIS — R262 Difficulty in walking, not elsewhere classified: Secondary | ICD-10-CM | POA: Diagnosis not present

## 2015-08-08 DIAGNOSIS — M79671 Pain in right foot: Secondary | ICD-10-CM | POA: Diagnosis not present

## 2015-08-08 DIAGNOSIS — R278 Other lack of coordination: Secondary | ICD-10-CM | POA: Diagnosis not present

## 2015-08-12 ENCOUNTER — Encounter: Payer: Self-pay | Admitting: Neurology

## 2015-08-12 ENCOUNTER — Ambulatory Visit (INDEPENDENT_AMBULATORY_CARE_PROVIDER_SITE_OTHER): Payer: Medicare Other | Admitting: Neurology

## 2015-08-12 VITALS — BP 94/54 | HR 65 | Ht 65.0 in | Wt 138.8 lb

## 2015-08-12 DIAGNOSIS — F05 Delirium due to known physiological condition: Secondary | ICD-10-CM | POA: Diagnosis not present

## 2015-08-12 DIAGNOSIS — R002 Palpitations: Secondary | ICD-10-CM

## 2015-08-12 DIAGNOSIS — I10 Essential (primary) hypertension: Secondary | ICD-10-CM | POA: Diagnosis not present

## 2015-08-12 DIAGNOSIS — M79671 Pain in right foot: Secondary | ICD-10-CM | POA: Diagnosis not present

## 2015-08-12 DIAGNOSIS — R278 Other lack of coordination: Secondary | ICD-10-CM | POA: Diagnosis not present

## 2015-08-12 DIAGNOSIS — R262 Difficulty in walking, not elsewhere classified: Secondary | ICD-10-CM | POA: Diagnosis not present

## 2015-08-12 DIAGNOSIS — M6281 Muscle weakness (generalized): Secondary | ICD-10-CM | POA: Diagnosis not present

## 2015-08-12 MED ORDER — METOPROLOL SUCCINATE ER 50 MG PO TB24: 50.0000 mg | ORAL_TABLET | Freq: Every day | ORAL | Status: DC

## 2015-08-12 NOTE — Patient Instructions (Signed)
-   continue ASA and lipitor for stroke prevention - would like to decrease metoprolol to '50mg'$  daily and continue amlodipine and losartan the same dose - check BP at home twice a day for at least 2 weeks and record and bring over to PCP for medication adjustment if needed - will do 30 day cardiac monitoring to rule out afib - Follow up with your primary care physician for stroke risk factor modification. Recommend maintain blood pressure goal around 120/80, diabetes with hemoglobin A1c goal below 6.5% and lipids with LDL cholesterol goal below 70 mg/dL.  - follow up in 3 months

## 2015-08-12 NOTE — Progress Notes (Signed)
NEUROLOGY CLINIC NEW PATIENT NOTE  NAME: Cassidy Ellison DOB: 01-08-1922 REFERRING PHYSICIAN: Antony Blackbird, MD  I saw Cassidy Ellison as a new consult in the neurovascular clinic today regarding confusion episode.  HPI: Cassidy Ellison is a 82 y.o. female with PMH of HTN who presents as a new patient for confusion episode. She was in her normal health until 06/13/15 when she finished home PT session and was reading PT instructions. She felt that she had hard time to read and understand the instruction. She tried to call her daughter but not able to use the phone. When daughter came, she was able to talk but still has difficulty with phone and mild confusion. When she was in [facility] ER, her symptoms gradually back to baseline. She denies any HA, migraine hx, visual changes, or heart racing. They did not check BP at time of symptoms. She was admitted for evaluation. EEG normal, MRI showed questionable right pontine punctate infarct vs. Artifact. MRA head, CUS, TTE unremarkable. LDL 122 and A1C 6.0. She was discharged with ASA 325 and lipitor 20.   She has Hx of HTN, on norvasc '10mg'$ , cozaar '100mg'$ , and toprol '100mg'$ . She admitted that recently her BP was low, and her normal BP should be 120/60, but today only 94/54.   Interval history During the interval time, she has been doing well. No recurrent episode. Continued on ASA and lipitor. BP at lower side, today 94/54. Denies any dizziness, diaphoresis. Has followed up with PCP Dr. Chapman Fitch.    Past Medical History  Diagnosis Date  . Hypertension   . GERD (gastroesophageal reflux disease)   . Carpal tunnel syndrome     left  . DVT (deep venous thrombosis) ([place])   . RLS (restless legs syndrome)   . Stroke ([place])   . Hyperlipemia    Past Surgical History  Procedure Laterality Date  . Abdominal hysterectomy    . Gallbladder surgery    . Carpal tunnel release Left 10/31/2014    Procedure: LEFT CARPAL TUNNEL RELEASE;  Surgeon: Leanora Cover, MD;  Location: [facility];  Service: Orthopedics;  Laterality: Left;   Family History  Problem Relation Age of Onset  . Cancer Sister     breast cancer  . Heart failure Father   . Heart failure Mother   . Diabetes Brother    Current Outpatient Prescriptions  Medication Sig Dispense Refill  . amLODipine (NORVASC) 10 MG tablet Take 10 mg by mouth daily.    Marland Kitchen atorvastatin (LIPITOR) 20 MG tablet Take 1 tablet (20 mg total) by mouth daily at 6 PM. 30 tablet 0  . bisacodyl (DULCOLAX) 5 MG EC tablet Take 5-15 mg by mouth daily as needed for moderate constipation.    . Calcium Carbonate-Vit D-Min (CALCIUM 1200 PO) Take 1,200 mg by mouth daily.    . Cholecalciferol (VITAMIN D-3) 1000 units CAPS Take 1,000 Units by mouth daily.    Marland Kitchen Dextromethorphan-Guaifenesin 5-100 MG/5ML LIQD Take 5 mLs by mouth every 4 (four) hours as needed (cough).    . fish oil-omega-3 fatty acids 1000 MG capsule Take 2,000 mg by mouth daily.    Marland Kitchen losartan (COZAAR) 100 MG tablet Take 100 mg by mouth daily.    . metoprolol succinate (TOPROL-XL) 50 MG 24 hr tablet Take 1 tablet (50 mg total) by mouth daily. 30 tablet 3  . naproxen sodium (ANAPROX) 220 MG tablet Take 440 mg by mouth daily as needed (pain).    Marland Kitchen  omeprazole (PRILOSEC) 20 MG capsule Take 20 mg by mouth daily.    . ondansetron (ZOFRAN ODT) 4 MG disintegrating tablet Take 1 tablet (4 mg total) by mouth every 8 (eight) hours as needed for nausea or vomiting. 20 tablet 0  . ONE DAILY MULTIPLE VITAMIN PO Take by mouth. Reported on 08/12/2015    . polyethylene glycol (MIRALAX / GLYCOLAX) packet Take 17 g by mouth daily as needed for mild constipation.    Marland Kitchen RA ASPIRIN EC 325 MG EC tablet Take 325 mg by mouth daily.  0  . RED YEAST RICE EXTRACT PO Take 1,200 mg by mouth daily.    Marland Kitchen rOPINIRole (REQUIP) 0.25 MG tablet Take 1 tablet (0.25 mg total) by mouth at bedtime. 30 tablet 0  . triamcinolone cream (KENALOG) 0.1 % Apply 1 application topically 2 (two) times daily as needed (itch).    1  . vitamin C (ASCORBIC ACID) 500 MG tablet Take 500 mg by mouth daily.     No current facility-administered medications for this visit.   Allergies  Allergen Reactions  . Adhesive [Tape] Itching  . Chlorthalidone Other (See Comments)    creatinine  increases  . Hctz [Hydrochlorothiazide] Other (See Comments)    CREATNINE INCREASES  . Oxsoralen [Methoxsalen]    Social History   Social History  . Marital Status: Widowed    Spouse Name: N/A  . Number of Children: N/A  . Years of Education: N/A   Occupational History  . Not on file.   Social History Main Topics  . Smoking status: Former Research scientist (life sciences)  . Smokeless tobacco: Not on file     Comment: quit 20years ago  . Alcohol Use: Yes     Comment: social events  . Drug Use: No  . Sexual Activity: Not on file   Other Topics Concern  . Not on file   Social History Narrative    Review of Systems Full 14 system review of systems performed and notable only for those listed, all others are neg:  Constitutional:   Cardiovascular:  Ear/Nose/Throat:   Skin:  Eyes:   Respiratory:   Gastroitestinal:   Genitourinary:  Hematology/Lymphatic:   Endocrine:  Musculoskeletal:   Allergy/Immunology:   Neurological:   Psychiatric:  Sleep:    Physical Exam  Filed Vitals:   08/12/15 1505  BP: 94/54  Pulse: 65    General - Well nourished, well developed, in no apparent distress.  Ophthalmologic - Sharp disc margins OU.  Cardiovascular - Regular rate and rhythm with no murmur. Carotid pulses were 2+ without bruits .   Neck - supple, no nuchal rigidity .  Mental Status -  Level of arousal and orientation to time, place, and person were intact. Language including expression, naming, repetition, comprehension, reading, and writing was assessed and found intact. Attention span and concentration were normal. Recent and remote memory were intact. Fund of Knowledge was assessed and was intact.  Cranial Nerves II - XII - II -  Visual field intact OU. III, IV, VI - Extraocular movements intact. V - Facial sensation intact bilaterally. VII - Facial movement intact bilaterally. VIII - Hearing & vestibular intact bilaterally. X - Palate elevates symmetrically. XI - Chin turning & shoulder shrug intact bilaterally. XII - Tongue protrusion intact.  Motor Strength - The patient's strength was normal in all extremities and pronator drift was absent.  Bulk was normal and fasciculations were absent.   Motor Tone - Muscle tone was assessed at the neck and appendages  and was normal.  Reflexes - The patient's reflexes were normal in all extremities and she had no pathological reflexes.  Sensory - Light touch, temperature/pinprick were assessed and were normal.    Coordination - The patient had normal movements in the hands and feet with no ataxia or dysmetria.  Tremor was absent.  Gait and Station - The patient's transfers, posture, gait, station, and turns were observed as normal.   Imaging  I have personally reviewed the radiological images below and agree with the radiology interpretations.  Dg Chest 2 View 06/13/2015  Probable COPD changes. No acute abnormalities.  Ct Head Wo Contrast 06/13/2015  No acute intracranial process. Chronic microvascular ischemic changes.   Mr Jodene Nam Head/brain Wo Cm 06/14/2015   MRI HEAD:  Punctate focus of acute ischemia RIGHT cerebral peduncle versus artifact. Otherwise stable appearance of the brain from May 16, 2015 including involutional changes and mild to moderate chronic small vessel ischemic disease.   MRA HEAD:  No acute large vessel occlusion or high-grade stenosis. 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   CUS - Bilateral: 1-39% ICA stenosis. Vertebral artery flow is antegrade.  TTE - - Left ventricle: The cavity size was normal. Systolic function was  normal. The estimated ejection fraction was in the range of 60%  to 65%. Wall  motion was normal; there were no regional wall  motion abnormalities. Left ventricular diastolic function  parameters were normal. - Atrial septum: No defect or patent foramen ovale was identified.  Lab Review Component     Latest Ref Rng 06/14/2015  Cholesterol     0 - 200 mg/dL 189  Triglycerides     <150 mg/dL 68  HDL Cholesterol     >40 mg/dL 53  Total CHOL/HDL Ratio      3.6  VLDL     0 - 40 mg/dL 14  LDL (calc)     0 - 99 mg/dL 122 (H)  Hemoglobin A1C     4.8 - 5.6 % 6.0 (H)  Mean Plasma Glucose      126     Assessment and Plan:   In summary, Cassidy Ellison is a 82 y.o. female with PMH of HTN who presents as a new patient for confusion episode on 06/13/15, resolved in ER. She denies any HA or migraine hx. She was admitted for evaluation. EEG normal, MRI showed questionable right pontine punctate infarct vs. Artifact. MRA head, CUS, TTE unremarkable. LDL 122 and A1C 6.0. She was discharged with ASA 325 and lipitor 20. Her BP was low recently and on 3 BP meds but she did not check BP at the time of symptoms. DDX including TIA, migraine equivalent, hypotension, or seizure. If TIA, due to confusion symptoms and questionable pontine DWI punctate DWI change, afib needs to be considered. Will do 30 day cardiac monitoring. If low BP related, will need decrease BP meds. Migraine and seizure less likely due to lack of migraine hx and seizure features. EEG normal. Will decrease toprol dose.   - continue ASA and lipitor for stroke prevention - decrease metoprolol to '50mg'$  daily and continue amlodipine and losartan the same dose - check BP at home twice a day for at least 2 weeks and record and bring over to PCP for medication adjustment if needed - 30 day cardiac monitoring to rule out afib - Follow up with your primary care physician for stroke risk factor modification. Recommend maintain blood pressure goal around 120/80,  diabetes with hemoglobin A1c goal below 6.5% and lipids with LDL  cholesterol goal below 70 mg/dL.  - follow up in 3 months  I recommend aggressive blood pressure control with a goal <130/80 mm Hg.  Lipids should be managed intensively, with a goal LDL < 70 mg/dL.  I encouraged the patient to discuss these important issues with her primary care physician.  I counseled the patient on measures to reduce stroke risk, including the importance of medication compliance, risk factor control, exercise, healthy diet, and avoidance of smoking.  I reviewed stroke warning signs and symptoms and appropriate actions to take if such occurs.   Thank you very much for the opportunity to participate in the care of this patient.  Please do not hesitate to call if any questions or concerns arise.  Orders Placed This Encounter  Procedures  . Cardiac event monitor    Standing Status: Future     Number of Occurrences:      Standing Expiration Date: 08/12/2016    Scheduling Instructions:     Request cardionet setup. Thank you.    Order Specific Question:  Where should this test be performed?    Answer:  [facility]    Meds ordered this encounter  Medications  . metoprolol succinate (TOPROL-XL) 50 MG 24 hr tablet    Sig: Take 1 tablet (50 mg total) by mouth daily.    Dispense:  30 tablet    Refill:  3    Patient Instructions  - continue ASA and lipitor for stroke prevention - would like to decrease metoprolol to '50mg'$  daily and continue amlodipine and losartan the same dose - check BP at home twice a day for at least 2 weeks and record and bring over to PCP for medication adjustment if needed - will do 30 day cardiac monitoring to rule out afib - Follow up with your primary care physician for stroke risk factor modification. Recommend maintain blood pressure goal around 120/80, diabetes with hemoglobin A1c goal below 6.5% and lipids with LDL cholesterol goal below 70 mg/dL.  - follow up in 3 months     Rosalin Hawking, MD PhD [facility] [address]  [place], [place] [address] [phone]

## 2015-08-15 DIAGNOSIS — R262 Difficulty in walking, not elsewhere classified: Secondary | ICD-10-CM | POA: Diagnosis not present

## 2015-08-15 DIAGNOSIS — M79671 Pain in right foot: Secondary | ICD-10-CM | POA: Diagnosis not present

## 2015-08-15 DIAGNOSIS — R278 Other lack of coordination: Secondary | ICD-10-CM | POA: Diagnosis not present

## 2015-08-15 DIAGNOSIS — M6281 Muscle weakness (generalized): Secondary | ICD-10-CM | POA: Diagnosis not present

## 2015-08-18 ENCOUNTER — Other Ambulatory Visit: Payer: Self-pay | Admitting: Neurology

## 2015-08-18 DIAGNOSIS — R002 Palpitations: Secondary | ICD-10-CM

## 2015-08-18 DIAGNOSIS — G459 Transient cerebral ischemic attack, unspecified: Secondary | ICD-10-CM

## 2015-08-18 DIAGNOSIS — I4891 Unspecified atrial fibrillation: Secondary | ICD-10-CM

## 2015-08-19 ENCOUNTER — Ambulatory Visit (INDEPENDENT_AMBULATORY_CARE_PROVIDER_SITE_OTHER): Payer: Medicare Other

## 2015-08-19 DIAGNOSIS — R278 Other lack of coordination: Secondary | ICD-10-CM | POA: Diagnosis not present

## 2015-08-19 DIAGNOSIS — G459 Transient cerebral ischemic attack, unspecified: Secondary | ICD-10-CM

## 2015-08-19 DIAGNOSIS — I4891 Unspecified atrial fibrillation: Secondary | ICD-10-CM

## 2015-08-19 DIAGNOSIS — M79671 Pain in right foot: Secondary | ICD-10-CM | POA: Diagnosis not present

## 2015-08-19 DIAGNOSIS — R002 Palpitations: Secondary | ICD-10-CM | POA: Diagnosis not present

## 2015-08-19 DIAGNOSIS — M6281 Muscle weakness (generalized): Secondary | ICD-10-CM | POA: Diagnosis not present

## 2015-08-19 DIAGNOSIS — R262 Difficulty in walking, not elsewhere classified: Secondary | ICD-10-CM | POA: Diagnosis not present

## 2015-08-21 ENCOUNTER — Telehealth: Payer: Self-pay | Admitting: Neurology

## 2015-08-21 NOTE — Telephone Encounter (Signed)
Rn call Sonia Side at [phone] about patients restrictions for physical therapy. Rn stated that Dr.Xu did not order therapy. Sonia Side stated that patients PCP ,Dr. Chapman Fitch order the therapy prior to patient having stroke. Rn explain from Dr.Xu for her stroke, there is no restrictions per his office note. Rn stated patients PCP should be call too. Sonia Side verbalized understanding.

## 2015-08-21 NOTE — Telephone Encounter (Signed)
Cassidy Ellison with [facility] care called to check to see if there are any restrictions the pt has in regards to her PT. Please call [phone], fax: [phone]

## 2015-08-22 DIAGNOSIS — R262 Difficulty in walking, not elsewhere classified: Secondary | ICD-10-CM | POA: Diagnosis not present

## 2015-08-22 DIAGNOSIS — M79671 Pain in right foot: Secondary | ICD-10-CM | POA: Diagnosis not present

## 2015-08-22 DIAGNOSIS — R278 Other lack of coordination: Secondary | ICD-10-CM | POA: Diagnosis not present

## 2015-08-22 DIAGNOSIS — M6281 Muscle weakness (generalized): Secondary | ICD-10-CM | POA: Diagnosis not present

## 2015-08-26 DIAGNOSIS — R278 Other lack of coordination: Secondary | ICD-10-CM | POA: Diagnosis not present

## 2015-08-26 DIAGNOSIS — M79671 Pain in right foot: Secondary | ICD-10-CM | POA: Diagnosis not present

## 2015-08-26 DIAGNOSIS — M6281 Muscle weakness (generalized): Secondary | ICD-10-CM | POA: Diagnosis not present

## 2015-08-26 DIAGNOSIS — R262 Difficulty in walking, not elsewhere classified: Secondary | ICD-10-CM | POA: Diagnosis not present

## 2015-08-28 ENCOUNTER — Telehealth: Payer: Self-pay | Admitting: Neurology

## 2015-08-28 DIAGNOSIS — R278 Other lack of coordination: Secondary | ICD-10-CM | POA: Diagnosis not present

## 2015-08-28 DIAGNOSIS — R262 Difficulty in walking, not elsewhere classified: Secondary | ICD-10-CM | POA: Diagnosis not present

## 2015-08-28 DIAGNOSIS — M79671 Pain in right foot: Secondary | ICD-10-CM | POA: Diagnosis not present

## 2015-08-28 DIAGNOSIS — M6281 Muscle weakness (generalized): Secondary | ICD-10-CM | POA: Diagnosis not present

## 2015-08-28 NOTE — Telephone Encounter (Signed)
Rn call patient back about her heart monitor adhesive is making her skin having a reaction. Rn stated we dont monitor the cardiac monitor and the issues with it. PT thought we could evaluate the heart monitor and give her advice on it. Rn gave patient the number for  heartcare to discuss the issue with the adhesive. Pt verbalized understanding and appreciate a phone call.

## 2015-08-28 NOTE — Telephone Encounter (Signed)
Pt called in about a reaction she is having reaction with the heart monitor adhesive. She says it is extremely itchy. She wants to know if there is another method. Please call and advise [phone]

## 2015-09-02 DIAGNOSIS — M6281 Muscle weakness (generalized): Secondary | ICD-10-CM | POA: Diagnosis not present

## 2015-09-02 DIAGNOSIS — M79671 Pain in right foot: Secondary | ICD-10-CM | POA: Diagnosis not present

## 2015-09-02 DIAGNOSIS — R278 Other lack of coordination: Secondary | ICD-10-CM | POA: Diagnosis not present

## 2015-09-02 DIAGNOSIS — R262 Difficulty in walking, not elsewhere classified: Secondary | ICD-10-CM | POA: Diagnosis not present

## 2015-09-04 DIAGNOSIS — R262 Difficulty in walking, not elsewhere classified: Secondary | ICD-10-CM | POA: Diagnosis not present

## 2015-09-04 DIAGNOSIS — R278 Other lack of coordination: Secondary | ICD-10-CM | POA: Diagnosis not present

## 2015-09-04 DIAGNOSIS — M6281 Muscle weakness (generalized): Secondary | ICD-10-CM | POA: Diagnosis not present

## 2015-09-04 DIAGNOSIS — M79671 Pain in right foot: Secondary | ICD-10-CM | POA: Diagnosis not present

## 2015-09-09 DIAGNOSIS — M6281 Muscle weakness (generalized): Secondary | ICD-10-CM | POA: Diagnosis not present

## 2015-09-09 DIAGNOSIS — M79671 Pain in right foot: Secondary | ICD-10-CM | POA: Diagnosis not present

## 2015-09-09 DIAGNOSIS — R262 Difficulty in walking, not elsewhere classified: Secondary | ICD-10-CM | POA: Diagnosis not present

## 2015-09-09 DIAGNOSIS — R278 Other lack of coordination: Secondary | ICD-10-CM | POA: Diagnosis not present

## 2015-09-11 DIAGNOSIS — M79671 Pain in right foot: Secondary | ICD-10-CM | POA: Diagnosis not present

## 2015-09-11 DIAGNOSIS — M6281 Muscle weakness (generalized): Secondary | ICD-10-CM | POA: Diagnosis not present

## 2015-09-11 DIAGNOSIS — R262 Difficulty in walking, not elsewhere classified: Secondary | ICD-10-CM | POA: Diagnosis not present

## 2015-09-11 DIAGNOSIS — R278 Other lack of coordination: Secondary | ICD-10-CM | POA: Diagnosis not present

## 2015-09-12 ENCOUNTER — Ambulatory Visit: Payer: Medicare Other | Admitting: Podiatry

## 2015-09-12 ENCOUNTER — Encounter: Payer: Self-pay | Admitting: Podiatry

## 2015-09-12 DIAGNOSIS — B351 Tinea unguium: Secondary | ICD-10-CM | POA: Diagnosis not present

## 2015-09-12 DIAGNOSIS — M79676 Pain in unspecified toe(s): Secondary | ICD-10-CM | POA: Diagnosis not present

## 2015-09-12 DIAGNOSIS — M2042 Other hammer toe(s) (acquired), left foot: Secondary | ICD-10-CM

## 2015-09-12 NOTE — Progress Notes (Addendum)
Patient ID: Cassidy Ellison, female   DOB: [birth year], 82 y.o.   MRN: [id number] Complaint:  Visit Type: Patient returns to my office for continued preventative foot care services. Complaint: Patient states" my nails have grown long and thick and become painful to walk and wear shoes". The patient presents for preventative foot care services. No changes to ROS.  She is concerned about her hammer toe left foot that sits up on her big toe.Her right foot is now pain free with no swelling  Podiatric Exam: Vascular: dorsalis pedis and posterior tibial pulses are palpable bilateral. Capillary return is immediate. Temperature gradient is WNL. Skin turgor WNL  Sensorium: Normal Semmes Weinstein monofilament test. Normal tactile sensation bilaterally. Nail Exam: Pt has thick disfigured discolored nails with subungual debris noted bilateral entire nail hallux through fifth toenails Ulcer Exam: There is no evidence of ulcer or pre-ulcerative changes or infection. Orthopedic Exam: Muscle tone and strength are WNL. No limitations in general ROM. No crepitus or effusions noted. Foot type and digits show no abnormalities. Bony prominences are unremarkable. Skin: No Porokeratosis. No infection or ulcers.    Pinch callus hallux B/L. Distal clavi 2,3 left foot.  Diagnosis:  Onychomycosis, , Pain in right toe, pain in left toes  Treatment & Plan Procedures and Treatment: Consent by patient was obtained for treatment procedures. The patient understood the discussion of treatment and procedures well. All questions were answered thoroughly reviewed. Debridement of mycotic and hypertrophic toenails, 1 through 5 bilateral and clearing of subungual debris. No ulceration, no infection noted. Dispersion padding  Return Visit-Office Procedure: Patient instructed to return to the office for a follow up visit 10 weeks  for continued evaluation and treatment.    Gardiner Barefoot DPM

## 2015-09-16 DIAGNOSIS — M79671 Pain in right foot: Secondary | ICD-10-CM | POA: Diagnosis not present

## 2015-09-16 DIAGNOSIS — R262 Difficulty in walking, not elsewhere classified: Secondary | ICD-10-CM | POA: Diagnosis not present

## 2015-09-16 DIAGNOSIS — R278 Other lack of coordination: Secondary | ICD-10-CM | POA: Diagnosis not present

## 2015-09-16 DIAGNOSIS — M6281 Muscle weakness (generalized): Secondary | ICD-10-CM | POA: Diagnosis not present

## 2015-09-18 DIAGNOSIS — M79671 Pain in right foot: Secondary | ICD-10-CM | POA: Diagnosis not present

## 2015-09-18 DIAGNOSIS — M6281 Muscle weakness (generalized): Secondary | ICD-10-CM | POA: Diagnosis not present

## 2015-09-18 DIAGNOSIS — R278 Other lack of coordination: Secondary | ICD-10-CM | POA: Diagnosis not present

## 2015-09-18 DIAGNOSIS — R262 Difficulty in walking, not elsewhere classified: Secondary | ICD-10-CM | POA: Diagnosis not present

## 2015-10-08 DIAGNOSIS — M810 Age-related osteoporosis without current pathological fracture: Secondary | ICD-10-CM | POA: Diagnosis not present

## 2015-10-08 DIAGNOSIS — M79676 Pain in unspecified toe(s): Secondary | ICD-10-CM | POA: Diagnosis not present

## 2015-10-08 DIAGNOSIS — R350 Frequency of micturition: Secondary | ICD-10-CM | POA: Diagnosis not present

## 2015-10-08 DIAGNOSIS — E785 Hyperlipidemia, unspecified: Secondary | ICD-10-CM | POA: Diagnosis not present

## 2015-10-08 DIAGNOSIS — Z7901 Long term (current) use of anticoagulants: Secondary | ICD-10-CM | POA: Diagnosis not present

## 2015-10-08 DIAGNOSIS — I10 Essential (primary) hypertension: Secondary | ICD-10-CM | POA: Diagnosis not present

## 2015-10-08 DIAGNOSIS — M8580 Other specified disorders of bone density and structure, unspecified site: Secondary | ICD-10-CM | POA: Diagnosis not present

## 2015-10-08 DIAGNOSIS — R7303 Prediabetes: Secondary | ICD-10-CM | POA: Diagnosis not present

## 2015-10-15 DIAGNOSIS — Z7901 Long term (current) use of anticoagulants: Secondary | ICD-10-CM | POA: Diagnosis not present

## 2015-10-15 DIAGNOSIS — R7303 Prediabetes: Secondary | ICD-10-CM | POA: Diagnosis not present

## 2015-10-15 DIAGNOSIS — R8290 Unspecified abnormal findings in urine: Secondary | ICD-10-CM | POA: Diagnosis not present

## 2015-10-15 DIAGNOSIS — I10 Essential (primary) hypertension: Secondary | ICD-10-CM | POA: Diagnosis not present

## 2015-10-15 DIAGNOSIS — R7301 Impaired fasting glucose: Secondary | ICD-10-CM | POA: Diagnosis not present

## 2015-10-15 DIAGNOSIS — R350 Frequency of micturition: Secondary | ICD-10-CM | POA: Diagnosis not present

## 2015-10-15 DIAGNOSIS — E785 Hyperlipidemia, unspecified: Secondary | ICD-10-CM | POA: Diagnosis not present

## 2015-11-14 ENCOUNTER — Ambulatory Visit (INDEPENDENT_AMBULATORY_CARE_PROVIDER_SITE_OTHER): Payer: Medicare Other | Admitting: Podiatry

## 2015-11-14 ENCOUNTER — Encounter: Payer: Self-pay | Admitting: Podiatry

## 2015-11-14 DIAGNOSIS — B351 Tinea unguium: Secondary | ICD-10-CM

## 2015-11-14 DIAGNOSIS — M79673 Pain in unspecified foot: Secondary | ICD-10-CM | POA: Diagnosis not present

## 2015-11-14 DIAGNOSIS — M79676 Pain in unspecified toe(s): Secondary | ICD-10-CM

## 2015-11-14 NOTE — Progress Notes (Signed)
Patient ID: Cassidy Ellison, female   DOB: [birth year], 82 y.o.   MRN: [id number] Complaint:  Visit Type: Patient returns to my office for continued preventative foot care services. Complaint: Patient states" my nails have grown long and thick and become painful to walk and wear shoes". The patient presents for preventative foot care services. No changes to ROS.  Callus under the balls of both feet.  Podiatric Exam: Vascular: dorsalis pedis and posterior tibial pulses are palpable bilateral. Capillary return is immediate. Temperature gradient is WNL. Skin turgor WNL  Sensorium: Normal Semmes Weinstein monofilament test. Normal tactile sensation bilaterally. Nail Exam: Pt has thick disfigured discolored nails with subungual debris noted bilateral entire nail hallux through fifth toenails Ulcer Exam: There is no evidence of ulcer or pre-ulcerative changes or infection. Orthopedic Exam: Muscle tone and strength are WNL. No limitations in general ROM. No crepitus or effusions noted. Foot type and digits show no abnormalities. Bony prominences are unremarkable. Skin: No Porokeratosis. No infection or ulcers.    Diagnosis:  Onychomycosis, , Pain in right toe, pain in left toes  Treatment & Plan Procedures and Treatment: Consent by patient was obtained for treatment procedures. The patient understood the discussion of treatment and procedures well. All questions were answered thoroughly reviewed. Debridement of mycotic and hypertrophic toenails, 1 through 5 bilateral and clearing of subungual debris. No ulceration, no infection noted.  Dispersion padding 1/2 B/L Return Visit-Office Procedure: Patient instructed to return to the office for a follow up visit 3 months for continued evaluation and treatment.    Gardiner Barefoot DPM

## 2015-11-18 ENCOUNTER — Ambulatory Visit: Payer: Medicare Other | Admitting: Neurology

## 2015-11-24 DIAGNOSIS — D485 Neoplasm of uncertain behavior of skin: Secondary | ICD-10-CM | POA: Diagnosis not present

## 2015-11-24 DIAGNOSIS — L820 Inflamed seborrheic keratosis: Secondary | ICD-10-CM | POA: Diagnosis not present

## 2015-11-24 DIAGNOSIS — L821 Other seborrheic keratosis: Secondary | ICD-10-CM | POA: Diagnosis not present

## 2015-11-24 DIAGNOSIS — Z85828 Personal history of other malignant neoplasm of skin: Secondary | ICD-10-CM | POA: Diagnosis not present

## 2015-11-24 DIAGNOSIS — D0439 Carcinoma in situ of skin of other parts of face: Secondary | ICD-10-CM | POA: Diagnosis not present

## 2015-11-24 DIAGNOSIS — L570 Actinic keratosis: Secondary | ICD-10-CM | POA: Diagnosis not present

## 2016-01-21 DIAGNOSIS — E785 Hyperlipidemia, unspecified: Secondary | ICD-10-CM | POA: Diagnosis not present

## 2016-01-21 DIAGNOSIS — R8299 Other abnormal findings in urine: Secondary | ICD-10-CM | POA: Diagnosis not present

## 2016-01-21 DIAGNOSIS — R350 Frequency of micturition: Secondary | ICD-10-CM | POA: Diagnosis not present

## 2016-01-21 DIAGNOSIS — M79641 Pain in right hand: Secondary | ICD-10-CM | POA: Diagnosis not present

## 2016-01-21 DIAGNOSIS — Z79899 Other long term (current) drug therapy: Secondary | ICD-10-CM | POA: Diagnosis not present

## 2016-01-21 DIAGNOSIS — E871 Hypo-osmolality and hyponatremia: Secondary | ICD-10-CM | POA: Diagnosis not present

## 2016-01-21 DIAGNOSIS — I10 Essential (primary) hypertension: Secondary | ICD-10-CM | POA: Diagnosis not present

## 2016-01-21 DIAGNOSIS — R7301 Impaired fasting glucose: Secondary | ICD-10-CM | POA: Diagnosis not present

## 2016-01-21 DIAGNOSIS — Z23 Encounter for immunization: Secondary | ICD-10-CM | POA: Diagnosis not present

## 2016-01-21 DIAGNOSIS — M720 Palmar fascial fibromatosis [Dupuytren]: Secondary | ICD-10-CM | POA: Diagnosis not present

## 2016-01-21 DIAGNOSIS — M545 Low back pain: Secondary | ICD-10-CM | POA: Diagnosis not present

## 2016-01-21 DIAGNOSIS — M79642 Pain in left hand: Secondary | ICD-10-CM | POA: Diagnosis not present

## 2016-01-23 ENCOUNTER — Encounter: Payer: Self-pay | Admitting: Podiatry

## 2016-01-23 ENCOUNTER — Ambulatory Visit (INDEPENDENT_AMBULATORY_CARE_PROVIDER_SITE_OTHER): Payer: Medicare Other | Admitting: Podiatry

## 2016-01-23 DIAGNOSIS — L84 Corns and callosities: Secondary | ICD-10-CM

## 2016-01-23 DIAGNOSIS — M2042 Other hammer toe(s) (acquired), left foot: Secondary | ICD-10-CM

## 2016-01-23 DIAGNOSIS — M79676 Pain in unspecified toe(s): Secondary | ICD-10-CM | POA: Diagnosis not present

## 2016-01-23 DIAGNOSIS — B351 Tinea unguium: Secondary | ICD-10-CM | POA: Diagnosis not present

## 2016-01-23 DIAGNOSIS — M79673 Pain in unspecified foot: Secondary | ICD-10-CM

## 2016-01-23 NOTE — Progress Notes (Signed)
Patient ID: Cassidy Ellison, female   DOB: 20-Oct-1921, 82 y.o.   MRN: [id number] Complaint:  Visit Type: Patient returns to my office for continued preventative foot care services. Complaint: Patient states" my nails have grown long and thick and become painful to walk and wear shoes". The patient presents for preventative foot care services. No changes to ROS.  She is concerned about the corn on the third toe left foot.  Podiatric Exam: Vascular: dorsalis pedis and posterior tibial pulses are palpable bilateral. Capillary return is immediate. Temperature gradient is WNL. Skin turgor WNL  Sensorium: Normal Semmes Weinstein monofilament test. Normal tactile sensation bilaterally. Nail Exam: Pt has thick disfigured discolored nails with subungual debris noted bilateral entire nail hallux through fifth toenails Ulcer Exam: There is no evidence of ulcer or pre-ulcerative changes or infection. Orthopedic Exam: Muscle tone and strength are WNL. No limitations in general ROM. No crepitus or effusions noted. Foot type and digits show no abnormalities. Bony prominences are unremarkable. Skin: No Porokeratosis. No infection or ulcers.    Pinch callus hallux B/L. Distal clavi 2,3 left foot.  Diagnosis:  Onychomycosis, , Pain in right toe, pain in left toes  Treatment & Plan Procedures and Treatment: Consent by patient was obtained for treatment procedures. The patient understood the discussion of treatment and procedures well. All questions were answered thoroughly reviewed. Debridement of mycotic and hypertrophic toenails, 1 through 5 bilateral and clearing of subungual debris. No ulceration, no infection noted.  Return Visit-Office Procedure: Patient instructed to return to the office for a follow up visit 10 weeks  for continued evaluation and treatment.    Gardiner Barefoot DPM

## 2016-01-28 DIAGNOSIS — M720 Palmar fascial fibromatosis [Dupuytren]: Secondary | ICD-10-CM | POA: Diagnosis not present

## 2016-01-28 DIAGNOSIS — G5601 Carpal tunnel syndrome, right upper limb: Secondary | ICD-10-CM | POA: Diagnosis not present

## 2016-01-29 ENCOUNTER — Other Ambulatory Visit: Payer: Self-pay | Admitting: Orthopedic Surgery

## 2016-02-06 ENCOUNTER — Encounter (HOSPITAL_BASED_OUTPATIENT_CLINIC_OR_DEPARTMENT_OTHER): Payer: Self-pay | Admitting: *Deleted

## 2016-02-12 ENCOUNTER — Ambulatory Visit (HOSPITAL_BASED_OUTPATIENT_CLINIC_OR_DEPARTMENT_OTHER): Payer: Medicare Other | Admitting: Anesthesiology

## 2016-02-12 ENCOUNTER — Ambulatory Visit (HOSPITAL_BASED_OUTPATIENT_CLINIC_OR_DEPARTMENT_OTHER)
Admission: RE | Admit: 2016-02-12 | Discharge: 2016-02-12 | Disposition: A | Discharge status: Home / Self Care | Payer: Medicare Other | Source: Ambulatory Visit | Attending: Orthopedic Surgery | Admitting: Orthopedic Surgery

## 2016-02-12 ENCOUNTER — Encounter (HOSPITAL_BASED_OUTPATIENT_CLINIC_OR_DEPARTMENT_OTHER): Payer: Self-pay | Admitting: *Deleted

## 2016-02-12 ENCOUNTER — Encounter (HOSPITAL_BASED_OUTPATIENT_CLINIC_OR_DEPARTMENT_OTHER)
Admission: RE | Disposition: A | Discharge status: Home / Self Care | Payer: Self-pay | Source: Ambulatory Visit | Attending: Orthopedic Surgery

## 2016-02-12 DIAGNOSIS — G5601 Carpal tunnel syndrome, right upper limb: Secondary | ICD-10-CM | POA: Diagnosis not present

## 2016-02-12 DIAGNOSIS — Z90710 Acquired absence of both cervix and uterus: Secondary | ICD-10-CM | POA: Diagnosis not present

## 2016-02-12 DIAGNOSIS — Z833 Family history of diabetes mellitus: Secondary | ICD-10-CM | POA: Insufficient documentation

## 2016-02-12 DIAGNOSIS — Z87891 Personal history of nicotine dependence: Secondary | ICD-10-CM | POA: Insufficient documentation

## 2016-02-12 DIAGNOSIS — K219 Gastro-esophageal reflux disease without esophagitis: Secondary | ICD-10-CM | POA: Diagnosis not present

## 2016-02-12 DIAGNOSIS — G2581 Restless legs syndrome: Secondary | ICD-10-CM | POA: Diagnosis not present

## 2016-02-12 DIAGNOSIS — Z7982 Long term (current) use of aspirin: Secondary | ICD-10-CM | POA: Insufficient documentation

## 2016-02-12 DIAGNOSIS — Z803 Family history of malignant neoplasm of breast: Secondary | ICD-10-CM | POA: Insufficient documentation

## 2016-02-12 DIAGNOSIS — Z8249 Family history of ischemic heart disease and other diseases of the circulatory system: Secondary | ICD-10-CM | POA: Insufficient documentation

## 2016-02-12 DIAGNOSIS — I10 Essential (primary) hypertension: Secondary | ICD-10-CM | POA: Diagnosis not present

## 2016-02-12 DIAGNOSIS — Z79899 Other long term (current) drug therapy: Secondary | ICD-10-CM | POA: Insufficient documentation

## 2016-02-12 DIAGNOSIS — Z86718 Personal history of other venous thrombosis and embolism: Secondary | ICD-10-CM | POA: Insufficient documentation

## 2016-02-12 DIAGNOSIS — E785 Hyperlipidemia, unspecified: Secondary | ICD-10-CM | POA: Diagnosis not present

## 2016-02-12 DIAGNOSIS — Z8673 Personal history of transient ischemic attack (TIA), and cerebral infarction without residual deficits: Secondary | ICD-10-CM | POA: Diagnosis not present

## 2016-02-12 DIAGNOSIS — G5602 Carpal tunnel syndrome, left upper limb: Secondary | ICD-10-CM | POA: Diagnosis not present

## 2016-02-12 HISTORY — PX: CARPAL TUNNEL RELEASE: SHX101

## 2016-02-12 SURGERY — CARPAL TUNNEL RELEASE
Anesthesia: Monitor Anesthesia Care | Site: Wrist | Laterality: Right

## 2016-02-12 MED ORDER — HYDROCODONE-ACETAMINOPHEN 5-325 MG PO TABS: ORAL_TABLET | ORAL | 0 refills | Status: DC

## 2016-02-12 MED ORDER — 0.9 % SODIUM CHLORIDE (POUR BTL) OPTIME
TOPICAL | Status: DC | PRN
  Administered 2016-02-12: 200 mL

## 2016-02-12 MED ORDER — FENTANYL CITRATE (PF) 100 MCG/2ML IJ SOLN
INTRAMUSCULAR | Status: DC | PRN
  Administered 2016-02-12 (×2): 25 ug via INTRAVENOUS

## 2016-02-12 MED ORDER — BUPIVACAINE HCL (PF) 0.25 % IJ SOLN
INTRAMUSCULAR | Status: DC | PRN
Start: 2016-02-12 — End: 2016-02-12
  Administered 2016-02-12: 7 mL

## 2016-02-12 MED ORDER — CHLORHEXIDINE GLUCONATE 4 % EX LIQD: 60.0000 mL | Freq: Once | CUTANEOUS | Status: DC

## 2016-02-12 MED ORDER — CEFAZOLIN SODIUM-DEXTROSE 2-4 GM/100ML-% IV SOLN
2.0000 g | INTRAVENOUS | Status: AC
  Administered 2016-02-12: 2 g via INTRAVENOUS

## 2016-02-12 MED ORDER — LIDOCAINE HCL (PF) 0.5 % IJ SOLN
INTRAMUSCULAR | Status: DC | PRN
  Administered 2016-02-12: 30 mL via INTRAVENOUS

## 2016-02-12 MED ORDER — GLYCOPYRROLATE 0.2 MG/ML IJ SOLN: 0.2000 mg | Freq: Once | INTRAMUSCULAR | Status: DC | PRN

## 2016-02-12 MED ORDER — SCOPOLAMINE 1 MG/3DAYS TD PT72: 1.0000 | MEDICATED_PATCH | Freq: Once | TRANSDERMAL | Status: DC | PRN

## 2016-02-12 MED ORDER — LACTATED RINGERS IV SOLN
INTRAVENOUS | Status: DC
  Administered 2016-02-12: 13:00:00 via INTRAVENOUS

## 2016-02-12 MED ORDER — ONDANSETRON HCL 4 MG/2ML IJ SOLN
INTRAMUSCULAR | Status: DC | PRN
  Administered 2016-02-12: 4 mg via INTRAVENOUS

## 2016-02-12 MED ORDER — FENTANYL CITRATE (PF) 100 MCG/2ML IJ SOLN
INTRAMUSCULAR | Status: AC
  Filled 2016-02-12: qty 2

## 2016-02-12 MED ORDER — CEFAZOLIN SODIUM-DEXTROSE 2-4 GM/100ML-% IV SOLN
INTRAVENOUS | Status: AC
  Filled 2016-02-12: qty 100

## 2016-02-12 MED ORDER — FENTANYL CITRATE (PF) 100 MCG/2ML IJ SOLN: 25.0000 ug | INTRAMUSCULAR | Status: DC | PRN

## 2016-02-12 MED ORDER — PROPOFOL 10 MG/ML IV BOLUS
INTRAVENOUS | Status: DC | PRN
  Administered 2016-02-12 (×5): 10 mg via INTRAVENOUS

## 2016-02-12 MED ORDER — PROPOFOL 500 MG/50ML IV EMUL
INTRAVENOUS | Status: AC
  Filled 2016-02-12: qty 50

## 2016-02-12 SURGICAL SUPPLY — 35 items
BANDAGE ACE 3X5.8 VEL STRL LF (GAUZE/BANDAGES/DRESSINGS) ×3 IMPLANT
BLADE SURG 15 STRL LF DISP TIS (BLADE) ×2 IMPLANT
BLADE SURG 15 STRL SS (BLADE) ×4
BNDG ESMARK 4X9 LF (GAUZE/BANDAGES/DRESSINGS) IMPLANT
BNDG GAUZE ELAST 4 BULKY (GAUZE/BANDAGES/DRESSINGS) ×3 IMPLANT
CHLORAPREP W/TINT 26ML (MISCELLANEOUS) ×3 IMPLANT
CORDS BIPOLAR (ELECTRODE) ×3 IMPLANT
COVER BACK TABLE 60X90IN (DRAPES) ×3 IMPLANT
COVER MAYO STAND STRL (DRAPES) ×3 IMPLANT
CUFF TOURNIQUET SINGLE 18IN (TOURNIQUET CUFF) ×3 IMPLANT
DRAPE EXTREMITY T 121X128X90 (DRAPE) ×3 IMPLANT
DRAPE SURG 17X23 STRL (DRAPES) ×3 IMPLANT
DRSG PAD ABDOMINAL 8X10 ST (GAUZE/BANDAGES/DRESSINGS) ×3 IMPLANT
GAUZE SPONGE 4X4 12PLY STRL (GAUZE/BANDAGES/DRESSINGS) ×3 IMPLANT
GAUZE XEROFORM 1X8 LF (GAUZE/BANDAGES/DRESSINGS) ×3 IMPLANT
GLOVE BIO SURGEON STRL SZ7.5 (GLOVE) ×6 IMPLANT
GLOVE BIOGEL PI IND STRL 7.0 (GLOVE) ×1 IMPLANT
GLOVE BIOGEL PI IND STRL 8 (GLOVE) ×2 IMPLANT
GLOVE BIOGEL PI INDICATOR 7.0 (GLOVE) ×2
GLOVE BIOGEL PI INDICATOR 8 (GLOVE) ×4
GOWN STRL REUS W/ TWL LRG LVL3 (GOWN DISPOSABLE) IMPLANT
GOWN STRL REUS W/ TWL XL LVL3 (GOWN DISPOSABLE) ×1 IMPLANT
GOWN STRL REUS W/TWL LRG LVL3 (GOWN DISPOSABLE)
GOWN STRL REUS W/TWL XL LVL3 (GOWN DISPOSABLE) ×5 IMPLANT
NEEDLE HYPO 25X1 1.5 SAFETY (NEEDLE) ×3 IMPLANT
NS IRRIG 1000ML POUR BTL (IV SOLUTION) ×3 IMPLANT
PACK BASIN DAY SURGERY FS (CUSTOM PROCEDURE TRAY) ×3 IMPLANT
PADDING CAST ABS 4INX4YD NS (CAST SUPPLIES)
PADDING CAST ABS COTTON 4X4 ST (CAST SUPPLIES) IMPLANT
STOCKINETTE 4X48 STRL (DRAPES) ×3 IMPLANT
SUT ETHILON 4 0 PS 2 18 (SUTURE) ×3 IMPLANT
SYR BULB 3OZ (MISCELLANEOUS) ×3 IMPLANT
SYR CONTROL 10ML LL (SYRINGE) ×3 IMPLANT
TOWEL OR 17X24 6PK STRL BLUE (TOWEL DISPOSABLE) ×6 IMPLANT
UNDERPAD 30X30 (UNDERPADS AND DIAPERS) ×3 IMPLANT

## 2016-02-12 NOTE — Op Note (Signed)
02/12/2016 [facility]                              OPERATIVE REPORT   PREOPERATIVE DIAGNOSIS:  Right carpal tunnel syndrome.  POSTOPERATIVE DIAGNOSIS:  Right carpal tunnel syndrome.  PROCEDURE:  Right carpal tunnel release.  SURGEON:  Cassidy Cover, MD  ASSISTANT:  none.  ANESTHESIA:  Bier block and sedation.  IV FLUIDS:  Per anesthesia flow sheet.  ESTIMATED BLOOD LOSS:  Minimal.  COMPLICATIONS:  None.  SPECIMENS:  None.  TOURNIQUET TIME:    Total Tourniquet Time Documented: Forearm (Right) - 28 minutes Total: Forearm (Right) - 28 minutes   DISPOSITION:  Stable to PACU.  LOCATION: [facility]  INDICATIONS:  82 yo female with pain and nocturnal symptoms from right wrist.  Positive nerve conduction studies.   She wishes to have a carpal tunnel release for management of her symptoms.  Risks, benefits and alternatives of surgery were discussed including the risk of blood loss; infection; damage to nerves, vessels, tendons, ligaments, bone; failure of surgery; need for additional surgery; complications with wound healing; continued pain; recurrence of carpal tunnel syndrome; and damage to motor branch. She voiced understanding of these risks and elected to proceed.   OPERATIVE COURSE:  After being identified preoperatively by myself, the patient and I agreed upon the procedure and site of procedure.  The surgical site was marked.  The risks, benefits, and alternatives of the surgery were reviewed and she wished to proceed.  Surgical consent had been signed.  She was given IV Ancef as preoperative antibiotic prophylaxis.  She was transferred to the operating room and placed on the operating room table in supine position with the Right upper extremity on an armboard.  Bier block and sedation was induced by Anesthesiology.  Right upper extremity was prepped and draped in normal sterile orthopaedic fashion.  A surgical pause was performed between the surgeons,  anesthesia, and operating room staff, and all were in agreement as to the patient, procedure, and site of procedure.  Tourniquet at the proximal aspect of the forearm was inflated for the Bier block.  Incision was made over the transverse carpal ligament and carried into the subcutaneous tissues by spreading technique.  Bipolar electrocautery was used to obtain hemostasis.  The palmar fascia was sharply incised.  The transverse carpal ligament was identified and sharply incised.  It was incised distally first.  Care was taken to ensure complete decompression distally.  It was then incised proximally.  Scissors were used to split the distal aspect of the volar antebrachial fascia.  A finger was placed into the wound to ensure complete decompression, which was the case.  The nerve was examined.  There was an hourglass deformity.  The motor branch was identified and was intact.  The wound was copiously irrigated with sterile saline.  It was then closed with 4-0 nylon in a horizontal mattress fashion.  It was injected with 7 mL of 0.25% plain Marcaine to aid in postoperative analgesia.  It was dressed with sterile Xeroform, 4x4s, an ABD, and wrapped with Kerlix and an Ace bandage.  Tourniquet was deflated at 28 minutes.  Fingertips were pink with brisk capillary refill after deflation of the tourniquet.  Operative drapes were broken down.  The patient was awoken from anesthesia safely.  She was transferred back to stretcher and taken to the PACU in stable condition.  I will see her  back in the office in 1 week for postoperative followup.  I will give her a prescription for Norco 5/325 1-2 tabs PO q6 hours prn pain, dispense #20.    Cassidy Must, MD Electronically signed, 02/12/16

## 2016-02-12 NOTE — Transfer of Care (Signed)
Immediate Anesthesia Transfer of Care Note  Patient: [name]  Procedure(s) Performed: Procedure(s): CARPAL TUNNEL RELEASE, right (Right)  Patient Location: PACU  Anesthesia Type:Bier block  Level of Consciousness: awake, alert  and oriented  Airway & Oxygen Therapy: Patient Spontanous Breathing and Patient connected to face mask oxygen  Post-op Assessment: Report given to RN and Post -op Vital signs reviewed and stable  Post vital signs: Reviewed and stable  Last Vitals:  Vitals:   02/12/16 1213  BP: 138/61  Pulse: 75  Resp: 18  Temp: 36.5 C    Last Pain:  Vitals:   02/12/16 1213  TempSrc: Oral      Patients Stated Pain Goal: 0 (64/38/37 [id number])  Complications: No apparent anesthesia complications

## 2016-02-12 NOTE — H&P (Signed)
Cassidy Ellison is an 82 y.o. female.   Chief Complaint: right carpal tunnel syndrome HPI: 82 yo female with pain in right hand as well as nocturnal symptoms.  Positive nerve conduction studies.  She wishes to have a carpal tunnel release for management of her symptoms.  Allergies:  Allergies  Allergen Reactions  . Adhesive [Tape] Itching  . Chlorthalidone Other (See Comments)    creatinine  increases  . Hctz [Hydrochlorothiazide] Other (See Comments)    CREATNINE INCREASES  . Oxsoralen [Methoxsalen]     Past Medical History:  Diagnosis Date  . Carpal tunnel syndrome    left  . DVT (deep venous thrombosis) ([place])   . GERD (gastroesophageal reflux disease)   . Hyperlipemia   . Hypertension   . RLS (restless legs syndrome)   . Stroke [facility])     Past Surgical History:  Procedure Laterality Date  . ABDOMINAL HYSTERECTOMY    . CARPAL TUNNEL RELEASE Left 10/31/2014   Procedure: LEFT CARPAL TUNNEL RELEASE;  Surgeon: Leanora Cover, MD;  Location: [facility];  Service: Orthopedics;  Laterality: Left;  . GALLBLADDER SURGERY      Family History: Family History  Problem Relation Age of Onset  . Heart failure Father   . Heart failure Mother   . Cancer Sister     breast cancer  . Diabetes Brother     Social History:   reports that she has quit smoking. She has quit using smokeless tobacco. She reports that she drinks alcohol. She reports that she does not use drugs.  Medications: Medications Prior to Admission  Medication Sig Dispense Refill  . amLODipine (NORVASC) 10 MG tablet Take 10 mg by mouth daily.    . bisacodyl (DULCOLAX) 5 MG EC tablet Take 5-15 mg by mouth daily as needed for moderate constipation.    . Calcium Carbonate-Vit D-Min (CALCIUM 1200 PO) Take 1,200 mg by mouth daily.    . Cholecalciferol (VITAMIN D-3) 1000 units CAPS Take 1,000 Units by mouth daily.    . fish oil-omega-3 fatty acids 1000 MG capsule Take 2,000 mg by mouth daily.    Marland Kitchen losartan  (COZAAR) 100 MG tablet Take 100 mg by mouth daily.    . metoprolol succinate (TOPROL-XL) 50 MG 24 hr tablet Take 1 tablet (50 mg total) by mouth daily. 30 tablet 3  . naproxen sodium (ANAPROX) 220 MG tablet Take 440 mg by mouth daily as needed (pain).    Marland Kitchen omeprazole (PRILOSEC) 20 MG capsule Take 20 mg by mouth daily.    . ondansetron (ZOFRAN ODT) 4 MG disintegrating tablet Take 1 tablet (4 mg total) by mouth every 8 (eight) hours as needed for nausea or vomiting. 20 tablet 0  . ONE DAILY MULTIPLE VITAMIN PO Take by mouth. Reported on 08/12/2015    . polyethylene glycol (MIRALAX / GLYCOLAX) packet Take 17 g by mouth daily as needed for mild constipation.    Marland Kitchen RA ASPIRIN EC 325 MG EC tablet Take 325 mg by mouth daily.  0  . RED YEAST RICE EXTRACT PO Take 1,200 mg by mouth daily.    Marland Kitchen rOPINIRole (REQUIP) 0.25 MG tablet Take 1 tablet (0.25 mg total) by mouth at bedtime. 30 tablet 0  . triamcinolone cream (KENALOG) 0.1 % Apply 1 application topically 2 (two) times daily as needed (itch).   1  . vitamin C (ASCORBIC ACID) 500 MG tablet Take 500 mg by mouth daily.    Marland Kitchen Dextromethorphan-Guaifenesin 5-100 MG/5ML LIQD Take  5 mLs by mouth every 4 (four) hours as needed (cough).      No results found for this or any previous visit (from the past 48 hour(s)).  No results found.   A comprehensive review of systems was negative.  Blood pressure 138/61, pulse 75, temperature 97.7 F (36.5 C), temperature source Oral, resp. rate 18, height '5\' 5"'$  (1.651 m), weight 65.3 kg (144 lb), SpO2 99 %.  General appearance: alert, cooperative and appears stated age Head: Normocephalic, without obvious abnormality, atraumatic Neck: supple, symmetrical, trachea midline Resp: clear to auscultation bilaterally Cardio: regular rate and rhythm GI: non-tender Extremities: Intact sensation and capillary refill all digits.  +epl/fpl/io.  No wounds.  Pulses: 2+ and symmetric Skin: Skin color, texture, turgor normal. No  rashes or lesions Neurologic: Grossly normal Incision/Wound:none  Assessment/Plan Right carpal tunnel syndrome.  Non operative and operative treatment options were discussed with the patient and patient wishes to proceed with operative treatment. Risks, benefits, and alternatives of surgery were discussed and the patient agrees with the plan of care.   Nazire Fruth R 02/12/2016, 12:38 PM

## 2016-02-12 NOTE — Anesthesia Preprocedure Evaluation (Signed)
Anesthesia Evaluation  Patient identified by MRN, date of birth, ID band Patient awake    Reviewed: Allergy & Precautions, NPO status , Patient's Chart, lab work & pertinent test results  Airway Mallampati: II  TM Distance: >3 FB Neck ROM: Full    Dental  (+) Teeth Intact, Dental Advisory Given   Pulmonary former smoker,    breath sounds clear to auscultation       Cardiovascular hypertension, Pt. on medications and Pt. on home beta blockers  Rhythm:Regular Rate:Normal     Neuro/Psych TIACVA    GI/Hepatic Neg liver ROS, GERD  ,  Endo/Other  negative endocrine ROS  Renal/GU negative Renal ROS     Musculoskeletal   Abdominal   Peds  Hematology negative hematology ROS (+)   Anesthesia Other Findings   Reproductive/Obstetrics                             Anesthesia Physical  Anesthesia Plan  ASA: III  Anesthesia Plan: MAC and Bier Block   Post-op Pain Management:    Induction: Intravenous  Airway Management Planned: Natural Airway and Simple Face Mask  Additional Equipment:   Intra-op Plan:   Post-operative Plan:   Informed Consent: I have reviewed the patients History and Physical, chart, labs and discussed the procedure including the risks, benefits and alternatives for the proposed anesthesia with the patient or authorized representative who has indicated his/her understanding and acceptance.     Plan Discussed with: CRNA and Anesthesiologist  Anesthesia Plan Comments:         Anesthesia Quick Evaluation

## 2016-02-12 NOTE — Anesthesia Postprocedure Evaluation (Signed)
Anesthesia Post Note  Patient: Cassidy Ellison  Procedure(s) Performed: Procedure(s) (LRB): CARPAL TUNNEL RELEASE, right (Right)  Patient location during evaluation: PACU Anesthesia Type: MAC and Bier Block Level of consciousness: awake and alert Pain management: pain level controlled Vital Signs Assessment: post-procedure vital signs reviewed and stable Respiratory status: spontaneous breathing, nonlabored ventilation, respiratory function stable and patient connected to nasal cannula oxygen Cardiovascular status: stable and blood pressure returned to baseline Anesthetic complications: no    Last Vitals:  Vitals:   02/12/16 1545 02/12/16 1600  BP: 131/73 126/85  Pulse: 75 75  Resp: 15 16  Temp:      Last Pain:  Vitals:   02/12/16 1600  TempSrc:   PainSc: 0-No pain                 Tiajuana Amass

## 2016-02-12 NOTE — Discharge Instructions (Addendum)

## 2016-02-12 NOTE — Brief Op Note (Signed)
02/12/2016  3:39 PM  PATIENT:  Cassidy Ellison  82 y.o. female  PRE-OPERATIVE DIAGNOSIS:  right carpal tunnel syndrome  POST-OPERATIVE DIAGNOSIS:  right carpal tunnel syndrome  PROCEDURE:  Procedure(s): CARPAL TUNNEL RELEASE, right (Right)  SURGEON:  Surgeon(s) and Role:    * Leanora Cover, MD - Primary  PHYSICIAN ASSISTANT:   ASSISTANTS: none   ANESTHESIA:   Bier block with sedation  EBL:  No intake/output data recorded.  BLOOD ADMINISTERED:none  DRAINS: none   LOCAL MEDICATIONS USED:  MARCAINE     SPECIMEN:  No Specimen  DISPOSITION OF SPECIMEN:  N/A  COUNTS:  YES  TOURNIQUET:   Total Tourniquet Time Documented: Forearm (Right) - 28 minutes Total: Forearm (Right) - 28 minutes   DICTATION: .Note written in EPIC  PLAN OF CARE: Discharge to home after PACU  PATIENT DISPOSITION:  PACU - hemodynamically stable.

## 2016-02-13 ENCOUNTER — Encounter (HOSPITAL_BASED_OUTPATIENT_CLINIC_OR_DEPARTMENT_OTHER): Payer: Self-pay | Admitting: Orthopedic Surgery

## 2016-03-26 ENCOUNTER — Ambulatory Visit: Payer: Medicare Other | Admitting: Podiatry

## 2016-04-07 DIAGNOSIS — G5601 Carpal tunnel syndrome, right upper limb: Secondary | ICD-10-CM | POA: Diagnosis not present

## 2016-04-07 DIAGNOSIS — R278 Other lack of coordination: Secondary | ICD-10-CM | POA: Diagnosis not present

## 2016-04-07 DIAGNOSIS — M6281 Muscle weakness (generalized): Secondary | ICD-10-CM | POA: Diagnosis not present

## 2016-04-08 DIAGNOSIS — G5601 Carpal tunnel syndrome, right upper limb: Secondary | ICD-10-CM | POA: Diagnosis not present

## 2016-04-08 DIAGNOSIS — R278 Other lack of coordination: Secondary | ICD-10-CM | POA: Diagnosis not present

## 2016-04-08 DIAGNOSIS — M6281 Muscle weakness (generalized): Secondary | ICD-10-CM | POA: Diagnosis not present

## 2016-04-12 DIAGNOSIS — R278 Other lack of coordination: Secondary | ICD-10-CM | POA: Diagnosis not present

## 2016-04-12 DIAGNOSIS — G5601 Carpal tunnel syndrome, right upper limb: Secondary | ICD-10-CM | POA: Diagnosis not present

## 2016-04-12 DIAGNOSIS — M6281 Muscle weakness (generalized): Secondary | ICD-10-CM | POA: Diagnosis not present

## 2016-04-13 ENCOUNTER — Ambulatory Visit: Payer: Medicare Other | Admitting: Podiatry

## 2016-04-14 DIAGNOSIS — G5601 Carpal tunnel syndrome, right upper limb: Secondary | ICD-10-CM | POA: Diagnosis not present

## 2016-04-14 DIAGNOSIS — M6281 Muscle weakness (generalized): Secondary | ICD-10-CM | POA: Diagnosis not present

## 2016-04-14 DIAGNOSIS — R278 Other lack of coordination: Secondary | ICD-10-CM | POA: Diagnosis not present

## 2016-04-15 DIAGNOSIS — M6281 Muscle weakness (generalized): Secondary | ICD-10-CM | POA: Diagnosis not present

## 2016-04-15 DIAGNOSIS — R278 Other lack of coordination: Secondary | ICD-10-CM | POA: Diagnosis not present

## 2016-04-15 DIAGNOSIS — G5601 Carpal tunnel syndrome, right upper limb: Secondary | ICD-10-CM | POA: Diagnosis not present

## 2016-04-19 ENCOUNTER — Ambulatory Visit (INDEPENDENT_AMBULATORY_CARE_PROVIDER_SITE_OTHER): Payer: Medicare Other | Admitting: Podiatry

## 2016-04-19 DIAGNOSIS — M2042 Other hammer toe(s) (acquired), left foot: Secondary | ICD-10-CM

## 2016-04-19 DIAGNOSIS — M79609 Pain in unspecified limb: Secondary | ICD-10-CM

## 2016-04-19 DIAGNOSIS — L608 Other nail disorders: Secondary | ICD-10-CM

## 2016-04-19 DIAGNOSIS — M21619 Bunion of unspecified foot: Secondary | ICD-10-CM

## 2016-04-19 DIAGNOSIS — B351 Tinea unguium: Secondary | ICD-10-CM

## 2016-04-19 DIAGNOSIS — M79671 Pain in right foot: Secondary | ICD-10-CM | POA: Diagnosis not present

## 2016-04-19 DIAGNOSIS — M2041 Other hammer toe(s) (acquired), right foot: Secondary | ICD-10-CM | POA: Diagnosis not present

## 2016-04-19 DIAGNOSIS — M2010 Hallux valgus (acquired), unspecified foot: Secondary | ICD-10-CM | POA: Diagnosis not present

## 2016-04-19 DIAGNOSIS — M79672 Pain in left foot: Secondary | ICD-10-CM | POA: Diagnosis not present

## 2016-04-19 DIAGNOSIS — L603 Nail dystrophy: Secondary | ICD-10-CM

## 2016-04-20 DIAGNOSIS — G5601 Carpal tunnel syndrome, right upper limb: Secondary | ICD-10-CM | POA: Diagnosis not present

## 2016-04-20 DIAGNOSIS — M6281 Muscle weakness (generalized): Secondary | ICD-10-CM | POA: Diagnosis not present

## 2016-04-20 DIAGNOSIS — R278 Other lack of coordination: Secondary | ICD-10-CM | POA: Diagnosis not present

## 2016-04-21 DIAGNOSIS — R278 Other lack of coordination: Secondary | ICD-10-CM | POA: Diagnosis not present

## 2016-04-21 DIAGNOSIS — M6281 Muscle weakness (generalized): Secondary | ICD-10-CM | POA: Diagnosis not present

## 2016-04-21 DIAGNOSIS — G5601 Carpal tunnel syndrome, right upper limb: Secondary | ICD-10-CM | POA: Diagnosis not present

## 2016-04-23 DIAGNOSIS — G5601 Carpal tunnel syndrome, right upper limb: Secondary | ICD-10-CM | POA: Diagnosis not present

## 2016-04-23 DIAGNOSIS — R278 Other lack of coordination: Secondary | ICD-10-CM | POA: Diagnosis not present

## 2016-04-23 DIAGNOSIS — M6281 Muscle weakness (generalized): Secondary | ICD-10-CM | POA: Diagnosis not present

## 2016-04-25 NOTE — Progress Notes (Signed)
SUBJECTIVE Patient  presents to office today complaining of elongated, thickened nails. Pain while ambulating in shoes. Patient is unable to trim their own nails.  Patient also has a new complaint of bunion and hammertoe deformities to the bilateral lower extremities.  OBJECTIVE General Patient is awake, alert, and oriented x 3 and in no acute distress. Derm Skin is dry and supple bilateral. Negative open lesions or macerations. Remaining integument unremarkable. Nails are tender, long, thickened and dystrophic with subungual debris, consistent with onychomycosis, 1-5 bilateral. No signs of infection noted. Vasc  DP and PT pedal pulses palpable bilaterally. Temperature gradient within normal limits.  Neuro Epicritic and protective threshold sensation diminished bilaterally.  Musculoskeletal Exam clinical evidence of bunion deformity with increased intermetatarsal angle and a hypertrophic medial eminence of the first metatarsal head noted with lateral deviation of the hallux bilateral. Hammertoe contracture deformities noted digits 2-5 bilateral  ASSESSMENT 1. Onychodystrophic nails 1-5 bilateral with hyperkeratosis of nails.  2. Onychomycosis of nail due to dermatophyte bilateral 3. Pain in foot bilateral  PLAN OF CARE 1. Patient evaluated today.  2. Instructed to maintain good pedal hygiene and foot care.  3. Mechanical debridement of nails 1-5 bilaterally performed using a nail nipper. Filed with dremel without incident.  4. Today we discussed the conservative versus surgical management of bunion and hammertoe deformities to the bilateral digits. At the moment the patient opts for conservative management including shoe gear modification, padding, and rest to alleviate symptoms.  5. Return to clinic in 3 mos.    Edrick Kins, DPM

## 2016-04-26 DIAGNOSIS — G5601 Carpal tunnel syndrome, right upper limb: Secondary | ICD-10-CM | POA: Diagnosis not present

## 2016-04-26 DIAGNOSIS — R278 Other lack of coordination: Secondary | ICD-10-CM | POA: Diagnosis not present

## 2016-04-26 DIAGNOSIS — M6281 Muscle weakness (generalized): Secondary | ICD-10-CM | POA: Diagnosis not present

## 2016-04-27 DIAGNOSIS — G5601 Carpal tunnel syndrome, right upper limb: Secondary | ICD-10-CM | POA: Diagnosis not present

## 2016-04-27 DIAGNOSIS — M6281 Muscle weakness (generalized): Secondary | ICD-10-CM | POA: Diagnosis not present

## 2016-04-27 DIAGNOSIS — R278 Other lack of coordination: Secondary | ICD-10-CM | POA: Diagnosis not present

## 2016-05-24 DIAGNOSIS — E785 Hyperlipidemia, unspecified: Secondary | ICD-10-CM | POA: Diagnosis not present

## 2016-05-24 DIAGNOSIS — E041 Nontoxic single thyroid nodule: Secondary | ICD-10-CM | POA: Diagnosis not present

## 2016-05-24 DIAGNOSIS — Z8673 Personal history of transient ischemic attack (TIA), and cerebral infarction without residual deficits: Secondary | ICD-10-CM | POA: Diagnosis not present

## 2016-05-24 DIAGNOSIS — K219 Gastro-esophageal reflux disease without esophagitis: Secondary | ICD-10-CM | POA: Diagnosis not present

## 2016-05-24 DIAGNOSIS — Z23 Encounter for immunization: Secondary | ICD-10-CM | POA: Diagnosis not present

## 2016-05-24 DIAGNOSIS — R7301 Impaired fasting glucose: Secondary | ICD-10-CM | POA: Diagnosis not present

## 2016-05-24 DIAGNOSIS — G2581 Restless legs syndrome: Secondary | ICD-10-CM | POA: Diagnosis not present

## 2016-05-24 DIAGNOSIS — M810 Age-related osteoporosis without current pathological fracture: Secondary | ICD-10-CM | POA: Diagnosis not present

## 2016-05-24 DIAGNOSIS — R938 Abnormal findings on diagnostic imaging of other specified body structures: Secondary | ICD-10-CM | POA: Diagnosis not present

## 2016-05-24 DIAGNOSIS — I10 Essential (primary) hypertension: Secondary | ICD-10-CM | POA: Diagnosis not present

## 2016-05-24 DIAGNOSIS — K5900 Constipation, unspecified: Secondary | ICD-10-CM | POA: Diagnosis not present

## 2016-05-24 DIAGNOSIS — Z79899 Other long term (current) drug therapy: Secondary | ICD-10-CM | POA: Diagnosis not present

## 2016-06-16 ENCOUNTER — Other Ambulatory Visit: Payer: Self-pay | Admitting: Family

## 2016-06-16 DIAGNOSIS — R9389 Abnormal findings on diagnostic imaging of other specified body structures: Secondary | ICD-10-CM

## 2016-06-17 ENCOUNTER — Other Ambulatory Visit: Payer: Self-pay | Admitting: Family

## 2016-06-17 DIAGNOSIS — E041 Nontoxic single thyroid nodule: Secondary | ICD-10-CM

## 2016-06-21 ENCOUNTER — Ambulatory Visit
Admission: RE | Admit: 2016-06-21 | Discharge: 2016-06-21 | Disposition: A | Discharge status: Home / Self Care | Payer: Medicare Other | Source: Ambulatory Visit | Attending: Family | Admitting: Family

## 2016-06-21 DIAGNOSIS — R918 Other nonspecific abnormal finding of lung field: Secondary | ICD-10-CM | POA: Diagnosis not present

## 2016-06-21 DIAGNOSIS — R9389 Abnormal findings on diagnostic imaging of other specified body structures: Secondary | ICD-10-CM

## 2016-06-23 ENCOUNTER — Other Ambulatory Visit: Payer: Medicare Other

## 2016-06-28 ENCOUNTER — Other Ambulatory Visit: Payer: Medicare Other

## 2016-07-01 ENCOUNTER — Other Ambulatory Visit: Payer: Self-pay | Admitting: Family

## 2016-07-01 DIAGNOSIS — Z1231 Encounter for screening mammogram for malignant neoplasm of breast: Secondary | ICD-10-CM

## 2016-07-19 ENCOUNTER — Ambulatory Visit: Payer: Medicare Other | Admitting: Podiatry

## 2016-07-26 DIAGNOSIS — L821 Other seborrheic keratosis: Secondary | ICD-10-CM | POA: Diagnosis not present

## 2016-07-26 DIAGNOSIS — L570 Actinic keratosis: Secondary | ICD-10-CM | POA: Diagnosis not present

## 2016-07-26 DIAGNOSIS — Z85828 Personal history of other malignant neoplasm of skin: Secondary | ICD-10-CM | POA: Diagnosis not present

## 2016-07-28 ENCOUNTER — Ambulatory Visit: Payer: Medicare Other

## 2016-08-04 DIAGNOSIS — H524 Presbyopia: Secondary | ICD-10-CM | POA: Diagnosis not present

## 2016-08-04 DIAGNOSIS — H52223 Regular astigmatism, bilateral: Secondary | ICD-10-CM | POA: Diagnosis not present

## 2016-08-04 DIAGNOSIS — H1859 Other hereditary corneal dystrophies: Secondary | ICD-10-CM | POA: Diagnosis not present

## 2016-08-04 DIAGNOSIS — H04123 Dry eye syndrome of bilateral lacrimal glands: Secondary | ICD-10-CM | POA: Diagnosis not present

## 2016-08-04 DIAGNOSIS — H5213 Myopia, bilateral: Secondary | ICD-10-CM | POA: Diagnosis not present

## 2016-08-04 DIAGNOSIS — Z961 Presence of intraocular lens: Secondary | ICD-10-CM | POA: Diagnosis not present

## 2016-09-17 DIAGNOSIS — T148XXA Other injury of unspecified body region, initial encounter: Secondary | ICD-10-CM | POA: Diagnosis not present

## 2016-09-27 DIAGNOSIS — D485 Neoplasm of uncertain behavior of skin: Secondary | ICD-10-CM | POA: Diagnosis not present

## 2016-09-27 DIAGNOSIS — Z85828 Personal history of other malignant neoplasm of skin: Secondary | ICD-10-CM | POA: Diagnosis not present

## 2016-09-27 DIAGNOSIS — C44719 Basal cell carcinoma of skin of left lower limb, including hip: Secondary | ICD-10-CM | POA: Diagnosis not present

## 2016-11-12 DIAGNOSIS — Z85828 Personal history of other malignant neoplasm of skin: Secondary | ICD-10-CM | POA: Diagnosis not present

## 2016-11-12 DIAGNOSIS — L218 Other seborrheic dermatitis: Secondary | ICD-10-CM | POA: Diagnosis not present

## 2017-01-04 IMAGING — MR MR MRA HEAD W/O CM
9 of 14 series · 30 of 48 positions shown · non-contrast
Comparison: CT head June 13, 2015 and MRI head May 16, 2015

CLINICAL DATA: Acute onset confusion while reading today. History
of hypertension.

EXAM:
MRI HEAD WITHOUT CONTRAST
MRA HEAD WITHOUT CONTRAST
TECHNIQUE: Multiplanar, multiecho pulse sequences of the brain and surrounding
structures were obtained without intravenous contrast. Angiographic
images of the head were obtained using MRA technique without
contrast.

[Series 3: T1 · sagittal · 5.0mm · 0.47mm/px · 1 of 25 slices shown]
[im 1/25]
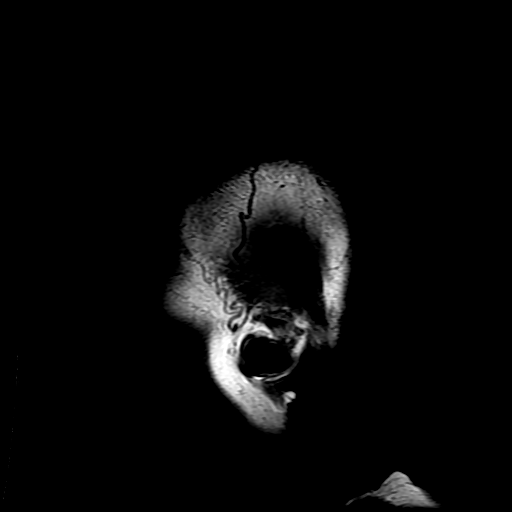

[Series 4: DWI · axial · 3.0mm · 1.09mm/px · z∈[-66,+77]mm · 6 of 98 slices shown (1 of 4)]
[im 1/98]
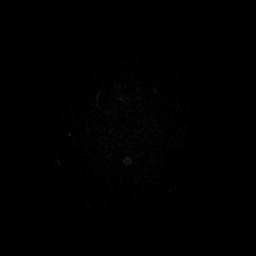
[im 20/98]
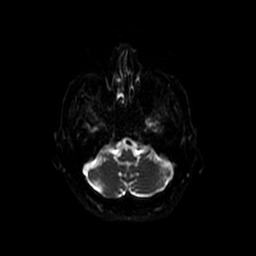
[im 39/98]
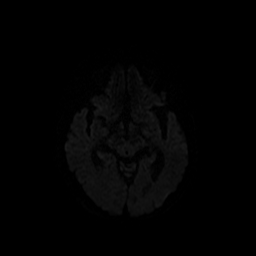
[im 59/98]
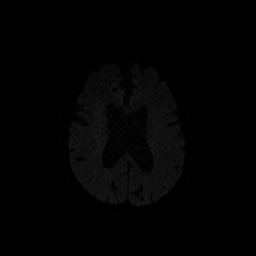
[im 78/98]
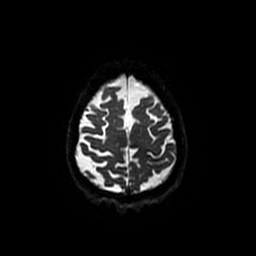
[im 98/98]
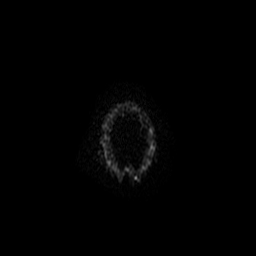

[Series 5: DWI · coronal · 5.0mm · 1.09mm/px · 4 of 66 slices shown (2 of 4)]
[im 1/66]
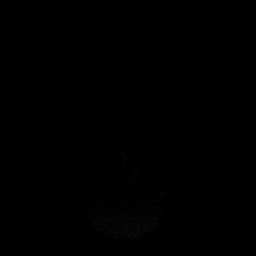
[im 22/66]
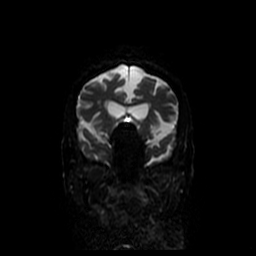
[im 44/66]
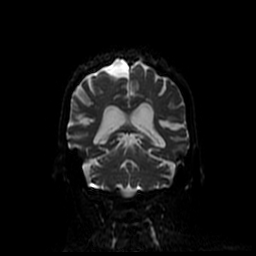
[im 66/66]
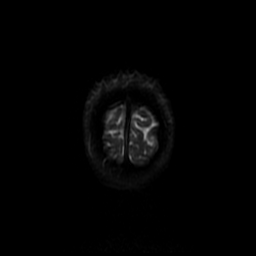

[Series 6: (id) mt fs · axial · 1.4mm · 0.43mm/px · z∈[-80,-5]mm · 6 of 156 slices shown]
[im 1/156]
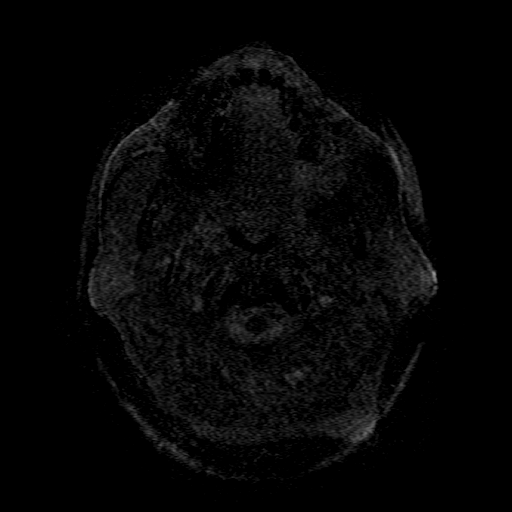
[im 32/156]
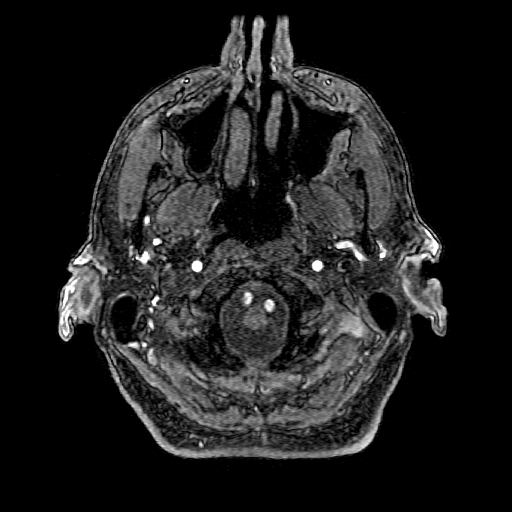
[im 47/156]
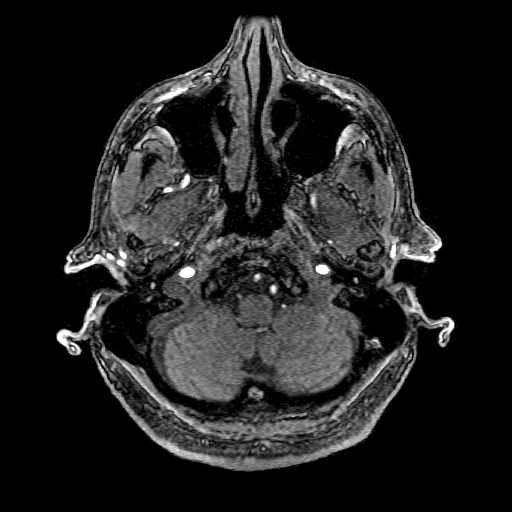
[im 63/156]
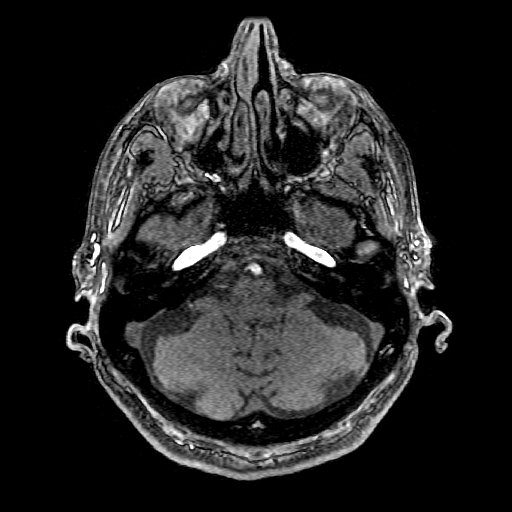
[im 94/156]
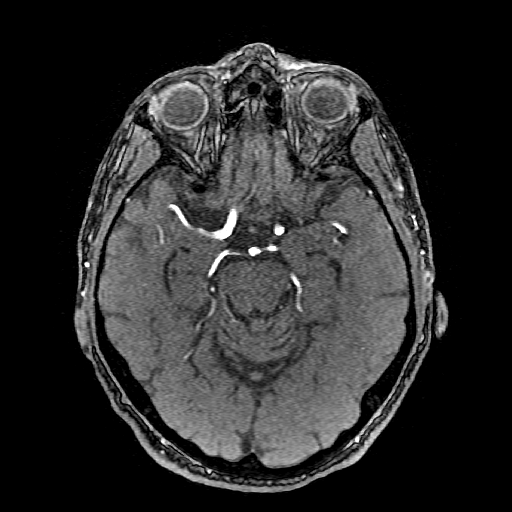
[im 109/156]
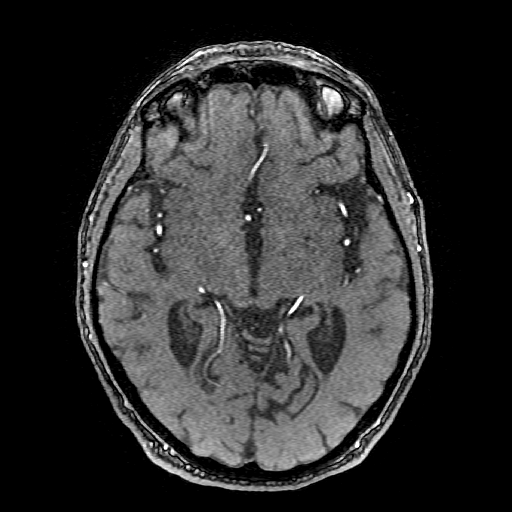

[Series 7: T2 · axial · 5.0mm · 0.43mm/px · z∈[-65,+78]mm · 2 of 25 slices shown (1 of 2)]
[im 1/25]
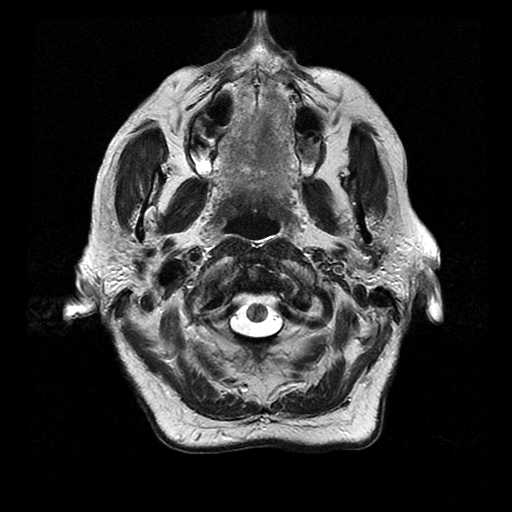
[im 25/25]
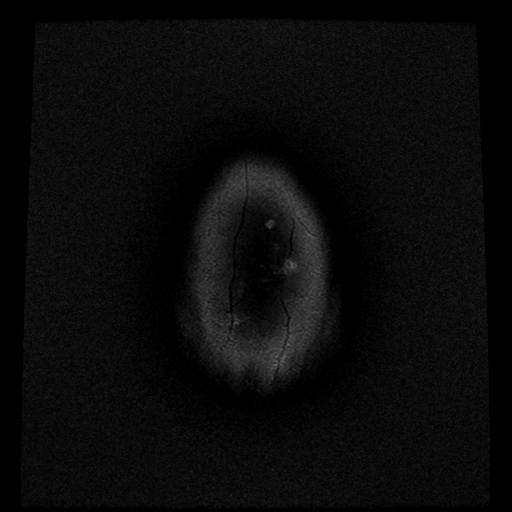

[Series 8: FLAIR · axial · 5.0mm · 0.43mm/px · z∈[-65,+78]mm · 2 of 25 slices shown]
[im 1/25]
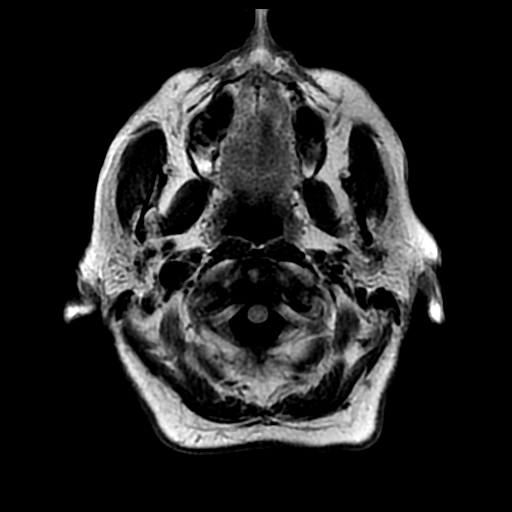
[im 25/25]
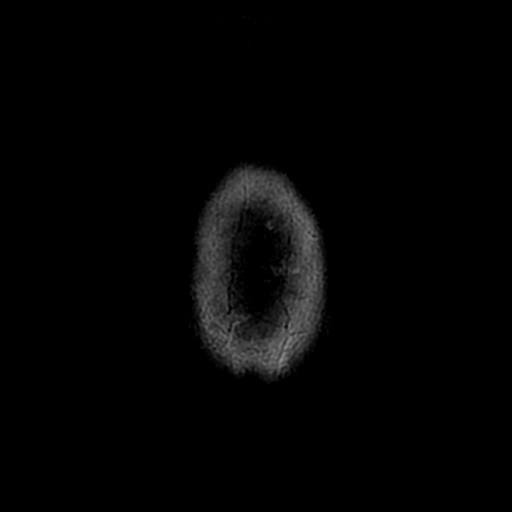

[Series 11: T2 · coronal · 5.0mm · 0.39mm/px · 2 of 25 slices shown (2 of 2)]
[im 1/25]
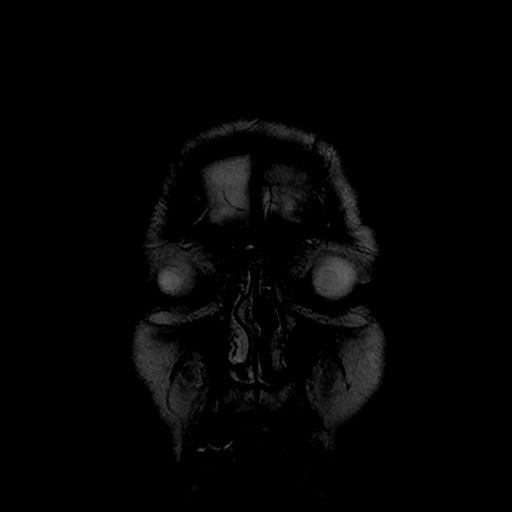
[im 25/25]
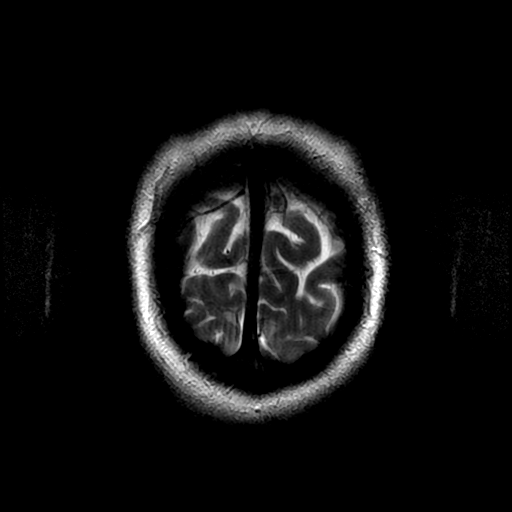

[Series 400: DWI · axial · 3.0mm · 1.09mm/px · z∈[-66,+77]mm · 4 of 49 slices shown (3 of 4)]
[im 1/49]
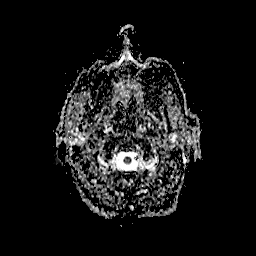
[im 17/49]
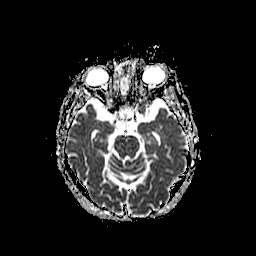
[im 33/49]
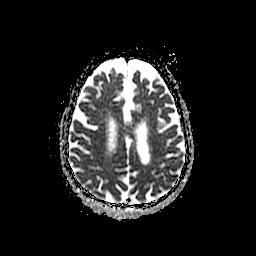
[im 49/49]
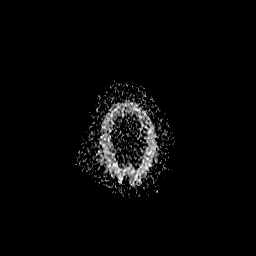

[Series 500: DWI · coronal · 5.0mm · 1.09mm/px · 3 of 33 slices shown (4 of 4)]
[im 1/33]
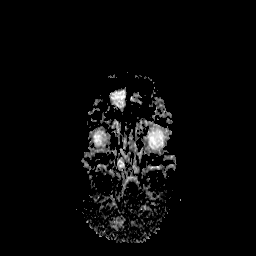
[im 17/33]
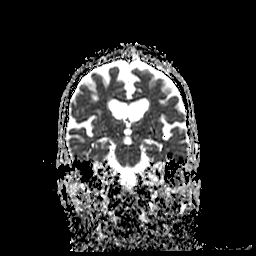
[im 33/33]
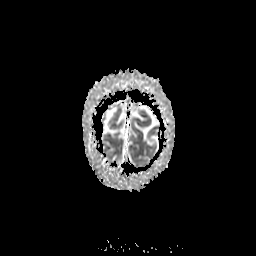

[30 of 48 positions shown; findings below may reference images not displayed]

FINDINGS: MRI HEAD FINDINGS

The ventricles and sulci are normal for patient's age. No abnormal
parenchymal signal, mass lesions, mass effect. Patchy supratentorial
and pontine white matter FLAIR T2 hyperintensities. Punctate focus
of reduced diffusion RIGHT cerebral peduncle, axial 19/ 98. No
susceptibility artifact to suggest hemorrhage.

No abnormal extra-axial fluid collections. No extra-axial masses
though, contrast enhanced sequences would be more sensitive. Normal
major intracranial vascular flow voids seen at the skull base.

Ocular globes and orbital contents are normal though not tailored
for evaluation, status post bilateral ocular lens implants. No
abnormal sellar expansion. No suspicious calvarial bone marrow
signal. Craniocervical junction maintained. Visualized paranasal
sinuses and mastoid air cells are well-aerated.

MRA HEAD FINDINGS

Anterior circulation: Normal flow related enhancement of the
included cervical, petrous, cavernous and supraclinoid internal
carotid arteries. Patent anterior communicating artery. Normal flow
related enhancement of the anterior and middle cerebral arteries,
including distal segments. 2 mm laterally directed aneurysm RIGHT
M1-2 junction.

No large vessel occlusion, high-grade stenosis, abnormal luminal
irregularity.

Posterior circulation: Codominant vertebral arteries. Basilar artery
is patent, with normal flow related enhancement of the main branch
vessels. 2 mm superiorly directed basilar tip aneurysm. Normal flow
related enhancement of the posterior cerebral arteries.

No large vessel occlusion, high-grade stenosis, abnormal luminal
irregularity.
IMPRESSION: MRI HEAD: Punctate focus of acute ischemia RIGHT cerebral peduncle
versus artifact.

Otherwise stable appearance of the brain from May 16, 2015
including involutional changes and mild to moderate chronic small
vessel ischemic disease.

MRA HEAD: No acute large vessel occlusion or high-grade stenosis.

2 mm intact basilar tip aneurysm. 2 mm RIGHT M1-2 junction aneurysm.

## 2017-02-08 DIAGNOSIS — Z23 Encounter for immunization: Secondary | ICD-10-CM | POA: Diagnosis not present

## 2017-02-24 DIAGNOSIS — R29818 Other symptoms and signs involving the nervous system: Secondary | ICD-10-CM | POA: Diagnosis not present

## 2017-02-24 DIAGNOSIS — E559 Vitamin D deficiency, unspecified: Secondary | ICD-10-CM | POA: Diagnosis not present

## 2017-02-24 DIAGNOSIS — E785 Hyperlipidemia, unspecified: Secondary | ICD-10-CM | POA: Diagnosis not present

## 2017-02-24 DIAGNOSIS — R350 Frequency of micturition: Secondary | ICD-10-CM | POA: Diagnosis not present

## 2017-02-24 DIAGNOSIS — I10 Essential (primary) hypertension: Secondary | ICD-10-CM | POA: Diagnosis not present

## 2017-03-05 ENCOUNTER — Emergency Department (HOSPITAL_COMMUNITY)
Admission: EM | Admit: 2017-03-05 | Discharge: 2017-03-05 | Disposition: A | Discharge status: Home / Self Care | Payer: Medicare Other | Attending: Emergency Medicine | Admitting: Emergency Medicine

## 2017-03-05 ENCOUNTER — Encounter (HOSPITAL_COMMUNITY): Payer: Self-pay | Admitting: Emergency Medicine

## 2017-03-05 ENCOUNTER — Emergency Department (HOSPITAL_COMMUNITY): Payer: Medicare Other

## 2017-03-05 DIAGNOSIS — S82001A Unspecified fracture of right patella, initial encounter for closed fracture: Secondary | ICD-10-CM | POA: Diagnosis not present

## 2017-03-05 DIAGNOSIS — Z87891 Personal history of nicotine dependence: Secondary | ICD-10-CM | POA: Insufficient documentation

## 2017-03-05 DIAGNOSIS — Y939 Activity, unspecified: Secondary | ICD-10-CM | POA: Diagnosis not present

## 2017-03-05 DIAGNOSIS — Y92099 Unspecified place in other non-institutional residence as the place of occurrence of the external cause: Secondary | ICD-10-CM | POA: Diagnosis not present

## 2017-03-05 DIAGNOSIS — S82091A Other fracture of right patella, initial encounter for closed fracture: Secondary | ICD-10-CM | POA: Diagnosis not present

## 2017-03-05 DIAGNOSIS — Z79899 Other long term (current) drug therapy: Secondary | ICD-10-CM | POA: Insufficient documentation

## 2017-03-05 DIAGNOSIS — Y999 Unspecified external cause status: Secondary | ICD-10-CM | POA: Diagnosis not present

## 2017-03-05 DIAGNOSIS — T148XXA Other injury of unspecified body region, initial encounter: Secondary | ICD-10-CM | POA: Diagnosis not present

## 2017-03-05 DIAGNOSIS — Z8673 Personal history of transient ischemic attack (TIA), and cerebral infarction without residual deficits: Secondary | ICD-10-CM | POA: Insufficient documentation

## 2017-03-05 DIAGNOSIS — Z7982 Long term (current) use of aspirin: Secondary | ICD-10-CM | POA: Insufficient documentation

## 2017-03-05 DIAGNOSIS — W19XXXA Unspecified fall, initial encounter: Secondary | ICD-10-CM | POA: Insufficient documentation

## 2017-03-05 DIAGNOSIS — S8991XA Unspecified injury of right lower leg, initial encounter: Secondary | ICD-10-CM | POA: Diagnosis present

## 2017-03-05 DIAGNOSIS — I10 Essential (primary) hypertension: Secondary | ICD-10-CM | POA: Diagnosis not present

## 2017-03-05 DIAGNOSIS — S0083XA Contusion of other part of head, initial encounter: Secondary | ICD-10-CM | POA: Insufficient documentation

## 2017-03-05 DIAGNOSIS — M25561 Pain in right knee: Secondary | ICD-10-CM | POA: Diagnosis not present

## 2017-03-05 DIAGNOSIS — S6992XA Unspecified injury of left wrist, hand and finger(s), initial encounter: Secondary | ICD-10-CM | POA: Diagnosis not present

## 2017-03-05 MED ORDER — HYDROCODONE-ACETAMINOPHEN 5-325 MG PO TABS: ORAL_TABLET | ORAL | 0 refills | Status: DC

## 2017-03-05 MED ORDER — HYDROCODONE-ACETAMINOPHEN 5-325 MG PO TABS: 1.0000 | ORAL_TABLET | ORAL | 0 refills | Status: DC | PRN

## 2017-03-05 NOTE — ED Provider Notes (Signed)
[facility] DEPT Provider Note   CSN: [id number] Arrival date & time: 03/05/17  2703     History   Chief Complaint Chief Complaint  Patient presents with  . Fall    HPI Cassidy Ellison is a 83 y.o. female.  83 y/o female here with right knee, left wrist and nose pain after mechanical fall yesterday at home No loc, neck pain, confusion Has severe right knee pain that's worse with standing and bending No knee or ankle discomfort Left wrist pain is mild and worse with mvoement as well Sx better with rest and no tx used pta      Past Medical History:  Diagnosis Date  . Carpal tunnel syndrome    left  . DVT (deep venous thrombosis) ([place])   . GERD (gastroesophageal reflux disease)   . Hyperlipemia   . Hypertension   . RLS (restless legs syndrome)   . Stroke [facility])     Patient Active Problem List   Diagnosis Date Noted  . Palpitations 08/12/2015  . HTN (hypertension) 08/12/2015  . Acute confusional state 08/12/2015  . Cerebrovascular accident (CVA) ([place])   . Hyperlipidemia   . TIA (transient ischemic attack) 06/13/2015  . Influenza with pneumonia 05/17/2015  . Essential hypertension 05/15/2015  . GERD (gastroesophageal reflux disease) 05/15/2015  . Cough 05/15/2015  . Acute encephalopathy 05/15/2015  . RLS (restless legs syndrome)   . Hyponatremia   . Weakness   . Dehydration 05/11/2015  . UTI (lower urinary tract infection) 05/11/2015  . Acute bronchitis with bronchospasm 05/11/2015    Past Surgical History:  Procedure Laterality Date  . ABDOMINAL HYSTERECTOMY    . CARPAL TUNNEL RELEASE Left 10/31/2014   Procedure: LEFT CARPAL TUNNEL RELEASE;  Surgeon: Leanora Cover, MD;  Location: [facility];  Service: Orthopedics;  Laterality: Left;  . CARPAL TUNNEL RELEASE Right 02/12/2016   Procedure: CARPAL TUNNEL RELEASE, right;  Surgeon: Leanora Cover, MD;  Location: [facility];  Service: Orthopedics;  Laterality:  Right;  . GALLBLADDER SURGERY      OB History    No data available       Home Medications    Prior to Admission medications   Medication Sig Start Date End Date Taking? Authorizing Provider  amLODipine (NORVASC) 10 MG tablet Take 10 mg by mouth daily.    [provider]  bisacodyl (DULCOLAX) 5 MG EC tablet Take 5-15 mg by mouth daily as needed for moderate constipation.    [provider]  Calcium Carbonate-Vit D-Min (CALCIUM 1200 PO) Take 1,200 mg by mouth daily.    [provider]  Cholecalciferol (VITAMIN D-3) 1000 units CAPS Take 1,000 Units by mouth daily.    [provider]  Dextromethorphan-Guaifenesin 5-100 MG/5ML LIQD Take 5 mLs by mouth every 4 (four) hours as needed (cough).    [provider]  fish oil-omega-3 fatty acids 1000 MG capsule Take 2,000 mg by mouth daily.    [provider]  HYDROcodone-acetaminophen (NORCO) 5-325 MG tablet 1-2 tabs po q6 hours prn pain 02/12/16   Leanora Cover, MD  losartan (COZAAR) 100 MG tablet Take 100 mg by mouth daily.    [provider]  metoprolol succinate (TOPROL-XL) 50 MG 24 hr tablet Take 1 tablet (50 mg total) by mouth daily. 08/12/15   Rosalin Hawking, MD  naproxen sodium (ANAPROX) 220 MG tablet Take 440 mg by mouth daily as needed (pain).    [provider]  omeprazole ([place])  20 MG capsule Take 20 mg by mouth daily.    [provider]  ondansetron (ZOFRAN ODT) 4 MG disintegrating tablet Take 1 tablet (4 mg total) by mouth every 8 (eight) hours as needed for nausea or vomiting. 05/13/15   Domenic Polite, MD  ONE DAILY MULTIPLE VITAMIN PO Take by mouth. Reported on 08/12/2015    [provider]  polyethylene glycol (MIRALAX / GLYCOLAX) packet Take 17 g by mouth daily as needed for mild constipation.    [provider]  RA ASPIRIN EC 325 MG EC tablet Take 325 mg by mouth daily. 06/14/15   [provider]  RED YEAST RICE EXTRACT PO Take  1,200 mg by mouth daily.    [provider]  rOPINIRole (REQUIP) 0.25 MG tablet Take 1 tablet (0.25 mg total) by mouth at bedtime. 05/13/15   Domenic Polite, MD  triamcinolone cream (KENALOG) 0.1 % Apply 1 application topically 2 (two) times daily as needed (itch).  02/19/15   [provider]  vitamin C (ASCORBIC ACID) 500 MG tablet Take 500 mg by mouth daily.    [provider]    Family History Family History  Problem Relation Age of Onset  . Heart failure Father   . Heart failure Mother   . Cancer Sister        breast cancer  . Diabetes Brother     Social History Social History  Substance Use Topics  . Smoking status: Former Research scientist (life sciences)  . Smokeless tobacco: Former Systems developer     Comment: quit 20years ago  . Alcohol use Yes     Comment: social events     Allergies   Adhesive [tape]; Chlorthalidone; Hctz [hydrochlorothiazide]; and Oxsoralen [methoxsalen]   Review of Systems Review of Systems  All other systems reviewed and are negative.    Physical Exam Updated Vital Signs BP (!) 143/71 (BP Location: Right Arm)   Pulse 89   Temp 98 F (36.7 C) (Oral)   Resp 16   SpO2 97%   Physical Exam  Constitutional: She is oriented to person, place, and time. She appears well-developed and well-nourished.  Non-toxic appearance. No distress.  HENT:  Head: Normocephalic and atraumatic.  Nose: Sinus tenderness present. No nose lacerations, septal deviation or nasal septal hematoma.    Eyes: Pupils are equal, round, and reactive to light. Conjunctivae, EOM and lids are normal.  Neck: Normal range of motion. Neck supple. No tracheal deviation present. No thyroid mass present.  Cardiovascular: Normal rate, regular rhythm and normal heart sounds.  Exam reveals no gallop.   No murmur heard. Pulmonary/Chest: Effort normal and breath sounds normal. No stridor. No respiratory distress. She has no decreased breath sounds. She has no wheezes. She has no rhonchi. She  has no rales.  Abdominal: Soft. Normal appearance and bowel sounds are normal. She exhibits no distension. There is no tenderness. There is no rebound and no CVA tenderness.  Musculoskeletal: Normal range of motion. She exhibits no edema.       Left wrist: She exhibits tenderness. She exhibits normal range of motion.       Right knee: She exhibits swelling and ecchymosis. No MCL and no LCL tenderness noted.       Arms:      Legs: No anatomical snuff box tenderness  Neurological: She is alert and oriented to person, place, and time. She has normal strength. No cranial nerve deficit or sensory deficit. GCS eye subscore is 4. GCS verbal subscore is 5.  GCS motor subscore is 6.  Skin: Skin is warm and dry. No abrasion and no rash noted.  Psychiatric: She has a normal mood and affect. Her speech is normal and behavior is normal.  Nursing note and vitals reviewed.    ED Treatments / Results  Labs (all labs ordered are listed, but only abnormal results are displayed) Labs Reviewed - No data to display  EKG  EKG Interpretation None       Radiology Dg Wrist Complete Left  Result Date: 03/05/2017 CLINICAL DATA:  Trip and fall in diving room. EXAM: LEFT WRIST - COMPLETE 3+ VIEW COMPARISON:  None. FINDINGS: No fracture deformity or dislocation. Faint intra-articular calcifications seen with CPPD. Severe first metacarpal carpal joint space narrowing with periarticular sclerosis and marginal spurring. Osteopenia without destructive bony lesions. Mild dorsal wrist soft tissue swelling without subcutaneous gas or radiopaque foreign bodies. IMPRESSION: No acute fracture deformity or dislocation. Severe first carpometacarpal osteoarthrosis. Electronically Signed   By: Elon Alas M.D.   On: 03/05/2017 06:53   Dg Knee Complete 4 Views Right  Result Date: 03/05/2017 CLINICAL DATA:  Trip and fall in diving room. EXAM: RIGHT KNEE - COMPLETE 4+ VIEW COMPARISON:  None. FINDINGS: Acute nondisplaced  comminuted patellar fracture in alignment. No dislocation. No arthropathy for age. No destructive bony lesions. Suprapatellar and prepatellar soft tissue swelling without subcutaneous gas or radiopaque foreign bodies. Faint calcifications in popliteal fossa seen with CPPD. IMPRESSION: Acute nondisplaced patella fracture.  No dislocation. Electronically Signed   By: Elon Alas M.D.   On: 03/05/2017 06:55    Procedures Procedures (including critical care time)  Medications Ordered in ED Medications - No data to display   Initial Impression / Assessment and Plan / ED Course  I have reviewed the triage vital signs and the nursing notes.  Pertinent labs & imaging results that were available during my care of the patient were reviewed by me and considered in my medical decision making (see chart for details).     Pt offered nasal xrays and has deferred Left wrist neg for fx Right knee shows non-displaced patella fx Knee imm placed by nursing and ortho referral given and return precautions  Final Clinical Impressions(s) / ED Diagnoses   Final diagnoses:  None    New Prescriptions New Prescriptions   No medications on file     Lacretia Leigh, MD 03/05/17 [phone]

## 2017-03-05 NOTE — ED Triage Notes (Signed)
Patient was brought in by EMS from [facility]. Patient fell last night about 6pm. Patient hit right knee, left wrist, and her nose. Patient has right knee swelling. Patient does not have any other complaints.

## 2017-03-05 NOTE — ED Notes (Signed)
Bed: VW97 Expected date:  Expected time:  Means of arrival:  Comments: EMS 83 yo female fall last night-swollen right knee-swelling bridge of nose

## 2017-03-08 DIAGNOSIS — S82034A Nondisplaced transverse fracture of right patella, initial encounter for closed fracture: Secondary | ICD-10-CM | POA: Diagnosis not present

## 2017-03-16 DIAGNOSIS — S82034D Nondisplaced transverse fracture of right patella, subsequent encounter for closed fracture with routine healing: Secondary | ICD-10-CM | POA: Diagnosis not present

## 2017-04-06 DIAGNOSIS — S82034D Nondisplaced transverse fracture of right patella, subsequent encounter for closed fracture with routine healing: Secondary | ICD-10-CM | POA: Diagnosis not present

## 2017-04-07 DIAGNOSIS — M25661 Stiffness of right knee, not elsewhere classified: Secondary | ICD-10-CM | POA: Diagnosis not present

## 2017-04-07 DIAGNOSIS — M223X1 Other derangements of patella, right knee: Secondary | ICD-10-CM | POA: Diagnosis not present

## 2017-04-07 DIAGNOSIS — R262 Difficulty in walking, not elsewhere classified: Secondary | ICD-10-CM | POA: Diagnosis not present

## 2017-04-07 DIAGNOSIS — M6281 Muscle weakness (generalized): Secondary | ICD-10-CM | POA: Diagnosis not present

## 2017-04-08 DIAGNOSIS — M6281 Muscle weakness (generalized): Secondary | ICD-10-CM | POA: Diagnosis not present

## 2017-04-08 DIAGNOSIS — M25661 Stiffness of right knee, not elsewhere classified: Secondary | ICD-10-CM | POA: Diagnosis not present

## 2017-04-08 DIAGNOSIS — M223X1 Other derangements of patella, right knee: Secondary | ICD-10-CM | POA: Diagnosis not present

## 2017-04-08 DIAGNOSIS — R262 Difficulty in walking, not elsewhere classified: Secondary | ICD-10-CM | POA: Diagnosis not present

## 2017-04-11 DIAGNOSIS — M223X1 Other derangements of patella, right knee: Secondary | ICD-10-CM | POA: Diagnosis not present

## 2017-04-11 DIAGNOSIS — M25661 Stiffness of right knee, not elsewhere classified: Secondary | ICD-10-CM | POA: Diagnosis not present

## 2017-04-11 DIAGNOSIS — M6281 Muscle weakness (generalized): Secondary | ICD-10-CM | POA: Diagnosis not present

## 2017-04-11 DIAGNOSIS — R262 Difficulty in walking, not elsewhere classified: Secondary | ICD-10-CM | POA: Diagnosis not present

## 2017-04-14 DIAGNOSIS — M223X1 Other derangements of patella, right knee: Secondary | ICD-10-CM | POA: Diagnosis not present

## 2017-04-14 DIAGNOSIS — R262 Difficulty in walking, not elsewhere classified: Secondary | ICD-10-CM | POA: Diagnosis not present

## 2017-04-14 DIAGNOSIS — M6281 Muscle weakness (generalized): Secondary | ICD-10-CM | POA: Diagnosis not present

## 2017-04-14 DIAGNOSIS — M25661 Stiffness of right knee, not elsewhere classified: Secondary | ICD-10-CM | POA: Diagnosis not present

## 2017-04-15 DIAGNOSIS — M223X1 Other derangements of patella, right knee: Secondary | ICD-10-CM | POA: Diagnosis not present

## 2017-04-15 DIAGNOSIS — M6281 Muscle weakness (generalized): Secondary | ICD-10-CM | POA: Diagnosis not present

## 2017-04-15 DIAGNOSIS — M25661 Stiffness of right knee, not elsewhere classified: Secondary | ICD-10-CM | POA: Diagnosis not present

## 2017-04-15 DIAGNOSIS — R262 Difficulty in walking, not elsewhere classified: Secondary | ICD-10-CM | POA: Diagnosis not present

## 2017-04-20 DIAGNOSIS — M223X1 Other derangements of patella, right knee: Secondary | ICD-10-CM | POA: Diagnosis not present

## 2017-04-20 DIAGNOSIS — R262 Difficulty in walking, not elsewhere classified: Secondary | ICD-10-CM | POA: Diagnosis not present

## 2017-04-20 DIAGNOSIS — M6281 Muscle weakness (generalized): Secondary | ICD-10-CM | POA: Diagnosis not present

## 2017-04-20 DIAGNOSIS — M25661 Stiffness of right knee, not elsewhere classified: Secondary | ICD-10-CM | POA: Diagnosis not present

## 2017-04-21 DIAGNOSIS — R413 Other amnesia: Secondary | ICD-10-CM | POA: Diagnosis not present

## 2017-04-21 DIAGNOSIS — M223X1 Other derangements of patella, right knee: Secondary | ICD-10-CM | POA: Diagnosis not present

## 2017-04-21 DIAGNOSIS — R262 Difficulty in walking, not elsewhere classified: Secondary | ICD-10-CM | POA: Diagnosis not present

## 2017-04-21 DIAGNOSIS — M6281 Muscle weakness (generalized): Secondary | ICD-10-CM | POA: Diagnosis not present

## 2017-04-21 DIAGNOSIS — E871 Hypo-osmolality and hyponatremia: Secondary | ICD-10-CM | POA: Diagnosis not present

## 2017-04-21 DIAGNOSIS — M25661 Stiffness of right knee, not elsewhere classified: Secondary | ICD-10-CM | POA: Diagnosis not present

## 2017-04-21 DIAGNOSIS — G2581 Restless legs syndrome: Secondary | ICD-10-CM | POA: Diagnosis not present

## 2017-04-21 DIAGNOSIS — R29818 Other symptoms and signs involving the nervous system: Secondary | ICD-10-CM | POA: Diagnosis not present

## 2017-04-21 DIAGNOSIS — R062 Wheezing: Secondary | ICD-10-CM | POA: Diagnosis not present

## 2017-04-22 ENCOUNTER — Other Ambulatory Visit: Payer: Self-pay | Admitting: Family Medicine

## 2017-04-22 DIAGNOSIS — R413 Other amnesia: Secondary | ICD-10-CM

## 2017-04-26 DIAGNOSIS — M25661 Stiffness of right knee, not elsewhere classified: Secondary | ICD-10-CM | POA: Diagnosis not present

## 2017-04-26 DIAGNOSIS — M6281 Muscle weakness (generalized): Secondary | ICD-10-CM | POA: Diagnosis not present

## 2017-04-26 DIAGNOSIS — R262 Difficulty in walking, not elsewhere classified: Secondary | ICD-10-CM | POA: Diagnosis not present

## 2017-04-26 DIAGNOSIS — M223X1 Other derangements of patella, right knee: Secondary | ICD-10-CM | POA: Diagnosis not present

## 2017-04-27 DIAGNOSIS — S82034D Nondisplaced transverse fracture of right patella, subsequent encounter for closed fracture with routine healing: Secondary | ICD-10-CM | POA: Diagnosis not present

## 2017-04-28 DIAGNOSIS — M25661 Stiffness of right knee, not elsewhere classified: Secondary | ICD-10-CM | POA: Diagnosis not present

## 2017-04-28 DIAGNOSIS — R262 Difficulty in walking, not elsewhere classified: Secondary | ICD-10-CM | POA: Diagnosis not present

## 2017-04-28 DIAGNOSIS — M6281 Muscle weakness (generalized): Secondary | ICD-10-CM | POA: Diagnosis not present

## 2017-04-28 DIAGNOSIS — M223X1 Other derangements of patella, right knee: Secondary | ICD-10-CM | POA: Diagnosis not present

## 2017-05-05 DIAGNOSIS — M223X1 Other derangements of patella, right knee: Secondary | ICD-10-CM | POA: Diagnosis not present

## 2017-05-05 DIAGNOSIS — R262 Difficulty in walking, not elsewhere classified: Secondary | ICD-10-CM | POA: Diagnosis not present

## 2017-05-05 DIAGNOSIS — M25661 Stiffness of right knee, not elsewhere classified: Secondary | ICD-10-CM | POA: Diagnosis not present

## 2017-05-05 DIAGNOSIS — M6281 Muscle weakness (generalized): Secondary | ICD-10-CM | POA: Diagnosis not present

## 2017-05-06 DIAGNOSIS — M6281 Muscle weakness (generalized): Secondary | ICD-10-CM | POA: Diagnosis not present

## 2017-05-06 DIAGNOSIS — M25661 Stiffness of right knee, not elsewhere classified: Secondary | ICD-10-CM | POA: Diagnosis not present

## 2017-05-06 DIAGNOSIS — M223X1 Other derangements of patella, right knee: Secondary | ICD-10-CM | POA: Diagnosis not present

## 2017-05-06 DIAGNOSIS — R262 Difficulty in walking, not elsewhere classified: Secondary | ICD-10-CM | POA: Diagnosis not present

## 2017-05-11 DIAGNOSIS — J209 Acute bronchitis, unspecified: Secondary | ICD-10-CM | POA: Diagnosis not present

## 2017-05-17 DIAGNOSIS — M6281 Muscle weakness (generalized): Secondary | ICD-10-CM | POA: Diagnosis not present

## 2017-05-17 DIAGNOSIS — M223X1 Other derangements of patella, right knee: Secondary | ICD-10-CM | POA: Diagnosis not present

## 2017-05-17 DIAGNOSIS — R262 Difficulty in walking, not elsewhere classified: Secondary | ICD-10-CM | POA: Diagnosis not present

## 2017-05-17 DIAGNOSIS — M25661 Stiffness of right knee, not elsewhere classified: Secondary | ICD-10-CM | POA: Diagnosis not present

## 2017-05-18 DIAGNOSIS — J209 Acute bronchitis, unspecified: Secondary | ICD-10-CM | POA: Diagnosis not present

## 2017-05-18 DIAGNOSIS — S40812A Abrasion of left upper arm, initial encounter: Secondary | ICD-10-CM | POA: Diagnosis not present

## 2017-05-18 DIAGNOSIS — I10 Essential (primary) hypertension: Secondary | ICD-10-CM | POA: Diagnosis not present

## 2017-05-18 DIAGNOSIS — S20222A Contusion of left back wall of thorax, initial encounter: Secondary | ICD-10-CM | POA: Diagnosis not present

## 2017-05-18 DIAGNOSIS — Z9189 Other specified personal risk factors, not elsewhere classified: Secondary | ICD-10-CM | POA: Diagnosis not present

## 2017-05-18 DIAGNOSIS — R29818 Other symptoms and signs involving the nervous system: Secondary | ICD-10-CM | POA: Diagnosis not present

## 2017-05-19 DIAGNOSIS — R262 Difficulty in walking, not elsewhere classified: Secondary | ICD-10-CM | POA: Diagnosis not present

## 2017-05-19 DIAGNOSIS — M223X1 Other derangements of patella, right knee: Secondary | ICD-10-CM | POA: Diagnosis not present

## 2017-05-19 DIAGNOSIS — M25661 Stiffness of right knee, not elsewhere classified: Secondary | ICD-10-CM | POA: Diagnosis not present

## 2017-05-19 DIAGNOSIS — M6281 Muscle weakness (generalized): Secondary | ICD-10-CM | POA: Diagnosis not present

## 2017-05-20 ENCOUNTER — Ambulatory Visit
Admission: RE | Admit: 2017-05-20 | Discharge: 2017-05-20 | Disposition: A | Discharge status: Home / Self Care | Payer: Medicare Other | Source: Ambulatory Visit | Attending: Family Medicine | Admitting: Family Medicine

## 2017-05-20 DIAGNOSIS — R413 Other amnesia: Secondary | ICD-10-CM | POA: Diagnosis not present

## 2017-05-24 DIAGNOSIS — M6281 Muscle weakness (generalized): Secondary | ICD-10-CM | POA: Diagnosis not present

## 2017-05-24 DIAGNOSIS — M223X1 Other derangements of patella, right knee: Secondary | ICD-10-CM | POA: Diagnosis not present

## 2017-05-24 DIAGNOSIS — R262 Difficulty in walking, not elsewhere classified: Secondary | ICD-10-CM | POA: Diagnosis not present

## 2017-05-24 DIAGNOSIS — M25661 Stiffness of right knee, not elsewhere classified: Secondary | ICD-10-CM | POA: Diagnosis not present

## 2017-05-26 DIAGNOSIS — R262 Difficulty in walking, not elsewhere classified: Secondary | ICD-10-CM | POA: Diagnosis not present

## 2017-05-26 DIAGNOSIS — M25661 Stiffness of right knee, not elsewhere classified: Secondary | ICD-10-CM | POA: Diagnosis not present

## 2017-05-26 DIAGNOSIS — M6281 Muscle weakness (generalized): Secondary | ICD-10-CM | POA: Diagnosis not present

## 2017-05-26 DIAGNOSIS — M223X1 Other derangements of patella, right knee: Secondary | ICD-10-CM | POA: Diagnosis not present

## 2017-05-31 DIAGNOSIS — R262 Difficulty in walking, not elsewhere classified: Secondary | ICD-10-CM | POA: Diagnosis not present

## 2017-05-31 DIAGNOSIS — M223X1 Other derangements of patella, right knee: Secondary | ICD-10-CM | POA: Diagnosis not present

## 2017-05-31 DIAGNOSIS — M6281 Muscle weakness (generalized): Secondary | ICD-10-CM | POA: Diagnosis not present

## 2017-05-31 DIAGNOSIS — M25661 Stiffness of right knee, not elsewhere classified: Secondary | ICD-10-CM | POA: Diagnosis not present

## 2017-06-02 DIAGNOSIS — M25661 Stiffness of right knee, not elsewhere classified: Secondary | ICD-10-CM | POA: Diagnosis not present

## 2017-06-02 DIAGNOSIS — R262 Difficulty in walking, not elsewhere classified: Secondary | ICD-10-CM | POA: Diagnosis not present

## 2017-06-02 DIAGNOSIS — M6281 Muscle weakness (generalized): Secondary | ICD-10-CM | POA: Diagnosis not present

## 2017-06-02 DIAGNOSIS — M223X1 Other derangements of patella, right knee: Secondary | ICD-10-CM | POA: Diagnosis not present

## 2017-06-08 DIAGNOSIS — R262 Difficulty in walking, not elsewhere classified: Secondary | ICD-10-CM | POA: Diagnosis not present

## 2017-06-08 DIAGNOSIS — M223X1 Other derangements of patella, right knee: Secondary | ICD-10-CM | POA: Diagnosis not present

## 2017-06-08 DIAGNOSIS — M25661 Stiffness of right knee, not elsewhere classified: Secondary | ICD-10-CM | POA: Diagnosis not present

## 2017-06-08 DIAGNOSIS — M6281 Muscle weakness (generalized): Secondary | ICD-10-CM | POA: Diagnosis not present

## 2017-06-09 DIAGNOSIS — L309 Dermatitis, unspecified: Secondary | ICD-10-CM | POA: Diagnosis not present

## 2017-06-10 DIAGNOSIS — M6281 Muscle weakness (generalized): Secondary | ICD-10-CM | POA: Diagnosis not present

## 2017-06-10 DIAGNOSIS — R262 Difficulty in walking, not elsewhere classified: Secondary | ICD-10-CM | POA: Diagnosis not present

## 2017-06-10 DIAGNOSIS — M223X1 Other derangements of patella, right knee: Secondary | ICD-10-CM | POA: Diagnosis not present

## 2017-06-10 DIAGNOSIS — M25661 Stiffness of right knee, not elsewhere classified: Secondary | ICD-10-CM | POA: Diagnosis not present

## 2017-06-13 DIAGNOSIS — M223X1 Other derangements of patella, right knee: Secondary | ICD-10-CM | POA: Diagnosis not present

## 2017-06-13 DIAGNOSIS — M6281 Muscle weakness (generalized): Secondary | ICD-10-CM | POA: Diagnosis not present

## 2017-06-13 DIAGNOSIS — R262 Difficulty in walking, not elsewhere classified: Secondary | ICD-10-CM | POA: Diagnosis not present

## 2017-06-13 DIAGNOSIS — M25661 Stiffness of right knee, not elsewhere classified: Secondary | ICD-10-CM | POA: Diagnosis not present

## 2017-06-15 ENCOUNTER — Ambulatory Visit (INDEPENDENT_AMBULATORY_CARE_PROVIDER_SITE_OTHER): Payer: Medicare Other

## 2017-06-15 ENCOUNTER — Encounter: Payer: Self-pay | Admitting: Podiatry

## 2017-06-15 ENCOUNTER — Other Ambulatory Visit: Payer: Self-pay | Admitting: Podiatry

## 2017-06-15 ENCOUNTER — Ambulatory Visit (INDEPENDENT_AMBULATORY_CARE_PROVIDER_SITE_OTHER): Payer: Medicare Other | Admitting: Podiatry

## 2017-06-15 DIAGNOSIS — M79609 Pain in unspecified limb: Secondary | ICD-10-CM

## 2017-06-15 DIAGNOSIS — L84 Corns and callosities: Secondary | ICD-10-CM

## 2017-06-15 DIAGNOSIS — M79671 Pain in right foot: Secondary | ICD-10-CM

## 2017-06-15 DIAGNOSIS — M79672 Pain in left foot: Secondary | ICD-10-CM

## 2017-06-15 DIAGNOSIS — B351 Tinea unguium: Secondary | ICD-10-CM | POA: Diagnosis not present

## 2017-06-15 NOTE — Progress Notes (Signed)
Subjective:   Patient ID: Cassidy Ellison, female   DOB: 84 y.o.   MRN: [id number]   HPI Patient presents with painful lesions both feet with digital deformities and concerns about long-term and whether surgery would be an option for her.  Also has nail disease 1-5 both feet that are thick incurvated painful and hard for her to cut   ROS      Objective:  Physical Exam  Neurovascular status was intact with rotated hallux bilateral with keratotic lesions on the inside of the digits and distal keratotic lesions with thick yellow brittle nailbeds 1-5 both feet that are painful     Assessment:  Hammertoe deformity with mycotic nail infection and lesion formation secondary to structural malalignment     Plan:  H&P all conditions reviewed and debridement of nailbeds of lesions accomplished today.  Discussed considerations for hammertoe repair but did not recommend due to advanced age and hopefully keeping symptoms under control with conservative care

## 2017-06-15 NOTE — Patient Instructions (Signed)
Hammer Toe Hammer toe is a change in the shape (a deformity) of your second, third, or fourth toe. The deformity causes the middle joint of your toe to stay bent. This causes pain, especially when you are wearing shoes. Hammer toe starts gradually. At first, the toe can be straightened. Gradually over time, the deformity becomes stiff and permanent. Early treatments to keep the toe straight may relieve pain. As the deformity becomes stiff and permanent, surgery may be needed to straighten the toe. What are the causes? Hammer toe is caused by abnormal bending of the toe joint that is closest to your foot. It happens gradually over time. This pulls on the muscles and connections (tendons) of the toe joint, making them weak and stiff. It is often related to wearing shoes that are too short or narrow and do not let your toes straighten. What increases the risk? You may be at greater risk for hammer toe if you:  Are female.  Are older.  Wear shoes that are too small.  Wear high-heeled shoes that pinch your toes.  Are a ballet dancer.  Have a second toe that is longer than your big toe (first toe).  Injure your foot or toe.  Have arthritis.  Have a family history of hammer toe.  Have a nerve or muscle disorder.  What are the signs or symptoms? The main symptoms of this condition are pain and deformity of the toe. The pain is worse when wearing shoes, walking, or running. Other symptoms may include:  Corns or calluses over the bent part of the toe or between the toes.  Redness and a burning feeling on the toe.  An open sore that forms on the top of the toe.  Not being able to straighten the toe.  How is this diagnosed? This condition is diagnosed based on your symptoms and a physical exam. During the exam, your health care provider will try to straighten your toe to see how stiff the deformity is. You may also have tests, such as:  A blood test to check for rheumatoid  arthritis.  An X-ray to show how severe the deformity is.  How is this treated? Treatment for this condition will depend on how stiff the deformity is. Surgery is often needed. However, sometimes a hammer toe can be straightened without surgery. Treatments that do not involve surgery include:  Taping the toe into a straightened position.  Using pads and cushions to protect the toe (orthotics).  Wearing shoes that provide enough room for the toes.  Doing toe-stretching exercises at home.  Taking an NSAID to reduce pain and swelling.  If these treatments do not help or the toe cannot be straightened, surgery is the next option. The most common surgeries used to straighten a hammer toe include:  Arthroplasty. In this procedure, part of the joint is removed, and that allows the toe to straighten.  Fusion. In this procedure, cartilage between the two bones of the joint is taken out and the bones are fused together into one longer bone.  Implantation. In this procedure, part of the bone is removed and replaced with an implant to let the toe move again.  Flexor tendon transfer. In this procedure, the tendons that curl the toes down (flexor tendons) are repositioned.  Follow these instructions at home:  Take over-the-counter and prescription medicines only as told by your health care provider.  Do toe straightening and stretching exercises as told by your health care provider.  Keep all   follow-up visits as told by your health care provider. This is important. How is this prevented?  Wear shoes that give your toes enough room and do not cause pain.  Do not wear high-heeled shoes. Contact a health care provider if:  Your pain gets worse.  Your toe becomes red or swollen.  You develop an open sore on your toe. This information is not intended to replace advice given to you by your health care provider. Make sure you discuss any questions you have with your health care  provider. Document Released: 04/23/2000 Document Revised: 11/14/2015 Document Reviewed: 08/20/2015 Elsevier Interactive Patient Education  2018 [employer]  

## 2017-06-15 NOTE — Progress Notes (Signed)
   Subjective:    Patient ID: Cassidy Ellison, female    DOB: 07-08-1921, 84 y.o.   MRN: [id number]  HPI    Review of Systems  All other systems reviewed and are negative.      Objective:   Physical Exam        Assessment & Plan:

## 2017-06-16 DIAGNOSIS — R262 Difficulty in walking, not elsewhere classified: Secondary | ICD-10-CM | POA: Diagnosis not present

## 2017-06-16 DIAGNOSIS — M25661 Stiffness of right knee, not elsewhere classified: Secondary | ICD-10-CM | POA: Diagnosis not present

## 2017-06-16 DIAGNOSIS — M223X1 Other derangements of patella, right knee: Secondary | ICD-10-CM | POA: Diagnosis not present

## 2017-06-16 DIAGNOSIS — M6281 Muscle weakness (generalized): Secondary | ICD-10-CM | POA: Diagnosis not present

## 2017-06-21 DIAGNOSIS — M6281 Muscle weakness (generalized): Secondary | ICD-10-CM | POA: Diagnosis not present

## 2017-06-21 DIAGNOSIS — M223X1 Other derangements of patella, right knee: Secondary | ICD-10-CM | POA: Diagnosis not present

## 2017-06-21 DIAGNOSIS — R262 Difficulty in walking, not elsewhere classified: Secondary | ICD-10-CM | POA: Diagnosis not present

## 2017-06-21 DIAGNOSIS — M25661 Stiffness of right knee, not elsewhere classified: Secondary | ICD-10-CM | POA: Diagnosis not present

## 2017-06-22 ENCOUNTER — Emergency Department (HOSPITAL_COMMUNITY)
Admission: EM | Admit: 2017-06-22 | Discharge: 2017-06-22 | Disposition: A | Discharge status: Home / Self Care | Payer: Medicare Other | Attending: Emergency Medicine | Admitting: Emergency Medicine

## 2017-06-22 ENCOUNTER — Emergency Department (HOSPITAL_COMMUNITY): Payer: Medicare Other

## 2017-06-22 ENCOUNTER — Encounter (HOSPITAL_COMMUNITY): Payer: Self-pay | Admitting: Emergency Medicine

## 2017-06-22 DIAGNOSIS — I10 Essential (primary) hypertension: Secondary | ICD-10-CM | POA: Diagnosis not present

## 2017-06-22 DIAGNOSIS — Z8673 Personal history of transient ischemic attack (TIA), and cerebral infarction without residual deficits: Secondary | ICD-10-CM | POA: Insufficient documentation

## 2017-06-22 DIAGNOSIS — Z87891 Personal history of nicotine dependence: Secondary | ICD-10-CM | POA: Insufficient documentation

## 2017-06-22 DIAGNOSIS — Z79899 Other long term (current) drug therapy: Secondary | ICD-10-CM | POA: Diagnosis not present

## 2017-06-22 DIAGNOSIS — R4182 Altered mental status, unspecified: Secondary | ICD-10-CM | POA: Insufficient documentation

## 2017-06-22 DIAGNOSIS — R918 Other nonspecific abnormal finding of lung field: Secondary | ICD-10-CM | POA: Diagnosis not present

## 2017-06-22 DIAGNOSIS — R402441 Other coma, without documented Glasgow coma scale score, or with partial score reported, in the field [EMT or ambulance]: Secondary | ICD-10-CM | POA: Diagnosis not present

## 2017-06-22 DIAGNOSIS — R4020 Unspecified coma: Secondary | ICD-10-CM | POA: Diagnosis not present

## 2017-06-22 LAB — CBC
HEMATOCRIT: 39.4 % (ref 36.0–46.0)
HEMOGLOBIN: 13.2 g/dL (ref 12.0–15.0)
MCH: 28.4 pg (ref 26.0–34.0)
MCHC: 33.5 g/dL (ref 30.0–36.0)
MCV: 84.7 fL (ref 78.0–100.0)
Platelets: 179 10*3/uL (ref 150–400)
RBC: 4.65 MIL/uL (ref 3.87–5.11)
RDW: 14.3 % (ref 11.5–15.5)
WBC: 8.4 10*3/uL (ref 4.0–10.5)

## 2017-06-22 LAB — URINALYSIS, ROUTINE W REFLEX MICROSCOPIC
BILIRUBIN URINE: NEGATIVE
GLUCOSE, UA: NEGATIVE mg/dL
HGB URINE DIPSTICK: NEGATIVE
Ketones, ur: NEGATIVE mg/dL
Leukocytes, UA: NEGATIVE
Nitrite: NEGATIVE
PROTEIN: NEGATIVE mg/dL
Specific Gravity, Urine: 1.002 — ABNORMAL LOW (ref 1.005–1.030)
pH: 8 (ref 5.0–8.0)

## 2017-06-22 LAB — COMPREHENSIVE METABOLIC PANEL
ALT: 20 U/L (ref 14–54)
AST: 23 U/L (ref 15–41)
Albumin: 4 g/dL (ref 3.5–5.0)
Alkaline Phosphatase: 76 U/L (ref 38–126)
Anion gap: 10 (ref 5–15)
BUN: 15 mg/dL (ref 6–20)
CHLORIDE: 97 mmol/L — AB (ref 101–111)
CO2: 23 mmol/L (ref 22–32)
Calcium: 9.7 mg/dL (ref 8.9–10.3)
Creatinine, Ser: 0.85 mg/dL (ref 0.44–1.00)
GFR, EST NON AFRICAN AMERICAN: 57 mL/min — AB (ref >60)
Glucose, Bld: 100 mg/dL — ABNORMAL HIGH (ref 65–99)
POTASSIUM: 4.2 mmol/L (ref 3.5–5.1)
SODIUM: 130 mmol/L — AB (ref 135–145)
Total Bilirubin: 1.1 mg/dL (ref 0.3–1.2)
Total Protein: 7.1 g/dL (ref 6.5–8.1)

## 2017-06-22 LAB — CBG MONITORING, ED: Glucose-Capillary: 92 mg/dL (ref 65–99)

## 2017-06-22 MED ORDER — SODIUM CHLORIDE 0.9 % IV SOLN
INTRAVENOUS | Status: DC
  Administered 2017-06-22: 13:00:00 via INTRAVENOUS

## 2017-06-22 NOTE — ED Triage Notes (Signed)
Per [facility]: Pt to ED from [facility] for altered mental status. Patient was seen by staff at 1900 last night and was normal. At 1000 this morning, patient's daughter went to see her and patient did not recognize her. Patient was unable to recall some objects and only stated "I don't feel well." She denies pain, no urinary symptoms, no fevers/chills. Patient normally alert and oriented x 4 - disoriented to place and situation. No hx dementia. Recent hx falls, had MRI 05/20/17 that came back with no acute changes. Patient now recognizes her daughter and can recall some objects. Resp e/u, skin warm/dry.

## 2017-06-22 NOTE — Discharge Instructions (Signed)
Extensive workup without any acute findings.  Recommend follow-up with her doctor.  She will need assistance at home in the meantime.  Return for any new or worse symptoms.  Lab work and MRI of brain without any significant abnormalities

## 2017-06-22 NOTE — ED Notes (Signed)
Patient returned from [facility]. Yellow socks and armband on patient, stretcher locked and in lowest position, call bell in reach.

## 2017-06-22 NOTE — ED Provider Notes (Signed)
[facility] EMERGENCY DEPARTMENT Provider Note   CSN: [id number] Arrival date & time: 06/22/17  1115     History   Chief Complaint Chief Complaint  Patient presents with  . Altered Mental Status    HPI Cassidy Ellison is a 84 y.o. female.  Patient lives at [facility] independent living.  Patient brought in for significant confusion this morning.  Accompanied by her daughter who is a Marine scientist here at [facility].  Patient was awake but was confused.  No known fall.  Patient without any specific complaints.  Patient without any specific speech difficulties but is confused about who her daughter is.      Past Medical History:  Diagnosis Date  . Carpal tunnel syndrome    left  . DVT (deep venous thrombosis) ([place])   . GERD (gastroesophageal reflux disease)   . Hyperlipemia   . Hypertension   . RLS (restless legs syndrome)   . Stroke [facility])     Patient Active Problem List   Diagnosis Date Noted  . Palpitations 08/12/2015  . HTN (hypertension) 08/12/2015  . Acute confusional state 08/12/2015  . Cerebrovascular accident (CVA) ([place])   . Hyperlipidemia   . TIA (transient ischemic attack) 06/13/2015  . Influenza with pneumonia 05/17/2015  . Essential hypertension 05/15/2015  . GERD (gastroesophageal reflux disease) 05/15/2015  . Cough 05/15/2015  . Acute encephalopathy 05/15/2015  . RLS (restless legs syndrome)   . Hyponatremia   . Weakness   . Dehydration 05/11/2015  . UTI (lower urinary tract infection) 05/11/2015  . Acute bronchitis with bronchospasm 05/11/2015    Past Surgical History:  Procedure Laterality Date  . ABDOMINAL HYSTERECTOMY    . CARPAL TUNNEL RELEASE Left 10/31/2014   Procedure: LEFT CARPAL TUNNEL RELEASE;  Surgeon: Leanora Cover, MD;  Location: [facility];  Service: Orthopedics;  Laterality: Left;  . CARPAL TUNNEL RELEASE Right 02/12/2016   Procedure: CARPAL TUNNEL RELEASE, right;  Surgeon: Leanora Cover, MD;  Location: [facility];  Service: Orthopedics;  Laterality: Right;  . GALLBLADDER SURGERY      OB History    No data available       Home Medications    Prior to Admission medications   Medication Sig Start Date End Date Taking? Authorizing Provider  amLODipine (NORVASC) 10 MG tablet Take 10 mg by mouth daily.    [provider]  bisacodyl (DULCOLAX) 5 MG EC tablet Take 5-15 mg by mouth daily as needed for moderate constipation.    [provider]  Calcium Carbonate-Vit D-Min (CALCIUM 1200 PO) Take 1,200 mg by mouth daily.    [provider]  Cholecalciferol (VITAMIN D-3) 1000 units CAPS Take 1,000 Units by mouth daily.    [provider]  Dextromethorphan-Guaifenesin 5-100 MG/5ML LIQD Take 5 mLs by mouth every 4 (four) hours as needed (cough).    [provider]  fish oil-omega-3 fatty acids 1000 MG capsule Take 2,000 mg by mouth daily.    [provider]  HYDROcodone-acetaminophen [facility]) 5-325 MG tablet 1-2 tabs po q6 hours prn pain 03/05/17   Lacretia Leigh, MD  HYDROcodone-acetaminophen (NORCO/VICODIN) 5-325 MG tablet Take 1-2 tablets by mouth every 4 (four) hours as needed. 03/05/17   Lacretia Leigh, MD  losartan (COZAAR) 100 MG tablet Take 100 mg by mouth daily.    [provider]  metoprolol succinate (TOPROL-XL) 50 MG 24 hr tablet Take 1 tablet (50 mg total) by mouth daily. 08/12/15   Erlinda Hong,  Jindong, MD  naproxen sodium (ANAPROX) 220 MG tablet Take 440 mg by mouth daily as needed (pain).    [provider]  omeprazole (PRILOSEC) 20 MG capsule Take 20 mg by mouth daily.    [provider]  ondansetron (ZOFRAN ODT) 4 MG disintegrating tablet Take 1 tablet (4 mg total) by mouth every 8 (eight) hours as needed for nausea or vomiting. 05/13/15   Domenic Polite, MD  ONE DAILY MULTIPLE VITAMIN PO Take by mouth. Reported on 08/12/2015    [provider]  polyethylene glycol (MIRALAX / GLYCOLAX) packet Take 17 g by  mouth daily as needed for mild constipation.    [provider]  RA ASPIRIN EC 325 MG EC tablet Take 325 mg by mouth daily. 06/14/15   [provider]  RED YEAST RICE EXTRACT PO Take 1,200 mg by mouth daily.    [provider]  rOPINIRole (REQUIP) 0.25 MG tablet Take 1 tablet (0.25 mg total) by mouth at bedtime. 05/13/15   Domenic Polite, MD  triamcinolone cream (KENALOG) 0.1 % Apply 1 application topically 2 (two) times daily as needed (itch).  02/19/15   [provider]  vitamin C (ASCORBIC ACID) 500 MG tablet Take 500 mg by mouth daily.    [provider]    Family History Family History  Problem Relation Age of Onset  . Heart failure Father   . Heart failure Mother   . Cancer Sister        breast cancer  . Diabetes Brother     Social History Social History   Tobacco Use  . Smoking status: Former Research scientist (life sciences)  . Smokeless tobacco: Former Systems developer  . Tobacco comment: quit 20years ago  Substance Use Topics  . Alcohol use: Yes    Comment: social events  . Drug use: No     Allergies   Adhesive [tape]; Chlorthalidone; Hctz [hydrochlorothiazide]; and Oxsoralen [methoxsalen]   Review of Systems Review of Systems  Constitutional: Negative for fever.  HENT: Negative for congestion.   Eyes: Negative for redness.  Respiratory: Negative for shortness of breath.   Cardiovascular: Negative for chest pain.  Gastrointestinal: Negative for abdominal pain.  Genitourinary: Negative for dysuria.  Musculoskeletal: Negative for myalgias.  Skin: Negative for rash.  Neurological: Negative for speech difficulty, weakness and headaches.  Hematological: Does not bruise/bleed easily.  Psychiatric/Behavioral: Positive for confusion.     Physical Exam Updated Vital Signs BP (!) 120/57   Pulse 68   Temp 98.1 F (36.7 C)   Resp 14   SpO2 98%   Physical Exam  Constitutional: She appears well-developed and well-nourished. No distress.  HENT:  Head:  Normocephalic and atraumatic.  Mouth/Throat: Oropharynx is clear and moist.  Eyes: Conjunctivae and EOM are normal. Pupils are equal, round, and reactive to light.  Neck: Normal range of motion. Neck supple.  Cardiovascular: Normal rate, regular rhythm and normal heart sounds.  Pulmonary/Chest: Effort normal and breath sounds normal.  Abdominal: Soft. Bowel sounds are normal.  Musculoskeletal: Normal range of motion.  Neurological: She is alert. No cranial nerve deficit. She exhibits normal muscle tone. Coordination normal.  Skin: Skin is warm. No rash noted.  Nursing note and vitals reviewed.    ED Treatments / Results  Labs (all labs ordered are listed, but only abnormal results are displayed) Labs Reviewed  COMPREHENSIVE METABOLIC PANEL - Abnormal; Notable for the following components:      Result Value   Sodium 130 (*)    Chloride  97 (*)    Glucose, Bld 100 (*)    GFR calc non Af Amer 57 (*)    All other components within normal limits  URINALYSIS, ROUTINE W REFLEX MICROSCOPIC - Abnormal; Notable for the following components:   Color, Urine STRAW (*)    Specific Gravity, Urine 1.002 (*)    All other components within normal limits  CBC  CBG MONITORING, ED    EKG  EKG Interpretation  Date/Time:  Wednesday June 22 2017 11:20:28 EST Ventricular Rate:  82 PR Interval:    QRS Duration: 90 QT Interval:  390 QTC Calculation: 456 R Axis:   -21 Text Interpretation:  Sinus rhythm Borderline left axis deviation Interpretation limited secondary to artifact Reconfirmed by Fredia Sorrow [phone]) on 06/22/2017 11:37:19 AM       Radiology Dg Chest 2 View  Result Date: 06/22/2017 CLINICAL DATA:  Altered mental status. EXAM: CHEST  2 VIEW COMPARISON:  CT chest 06/21/2016 and chest radiograph 06/13/2015. FINDINGS: Trachea is midline. Heart size normal. Thoracic aorta is calcified. Lungs are mildly hyperinflated but clear. No pleural fluid. IMPRESSION: 1. Hyperinflation  without acute finding. 2.  Aortic atherosclerosis (ICD10-170.0). Electronically Signed   By: Lorin Picket M.D.   On: 06/22/2017 12:30   Ct Head Wo Contrast  Result Date: 06/22/2017 CLINICAL DATA:  Altered level of consciousness.  History of stroke. EXAM: CT HEAD WITHOUT CONTRAST TECHNIQUE: Contiguous axial images were obtained from the base of the skull through the vertex without intravenous contrast. COMPARISON:  CT head 06/13/2015.  MRI brain 05/20/2017. FINDINGS: Brain: There is no evidence of acute intracranial hemorrhage, mass lesion, brain edema or extra-axial fluid collection. Stable chronic atrophy with prominence of the ventricles and subarachnoid spaces. There is mild chronic periventricular white matter disease. There is no CT evidence of acute cortical infarction. Vascular: Intracranial vascular calcifications. No hyperdense vessel identified. Skull: Negative for fracture or focal lesion. Sinuses/Orbits: The visualized paranasal sinuses and mastoid air cells are clear. No orbital abnormalities are seen. Other: None. IMPRESSION: No acute intracranial findings. Stable atrophy and chronic small vessel ischemic changes, mild for age. Electronically Signed   By: Richardean Sale M.D.   On: 06/22/2017 12:14   Mr Brain Wo Contrast (neuro Protocol)  Result Date: 06/22/2017 CLINICAL DATA:  Altered mental status. Symptoms have near completely resolved. Some persistent word-finding difficulty. EXAM: MRI HEAD WITHOUT CONTRAST TECHNIQUE: Multiplanar, multiecho pulse sequences of the brain and surrounding structures were obtained without intravenous contrast. COMPARISON:  CT head without contrast 06/22/2017. MRI brain 05/20/2017. FINDINGS: Brain: No acute infarct, hemorrhage, or mass lesion is present. The ventricles are of normal size. Moderate atrophy and white matter changes bilaterally are stable. No acute hemorrhage or mass lesion is present. Ventricles are proportionate to the degree of atrophy. No  significant extra-axial fluid collection is present. White matter changes extend into the brainstem. The cerebellum is unremarkable. Internal auditory canals are within normal limits bilaterally. Vascular: Flow is present in the major intracranial arteries. Skull and upper cervical spine: The skull base is within normal limits. The craniocervical junction is normal. Mild degenerative changes are present in the upper cervical spine. Marrow signal is normal. Midline sagittal structures are unremarkable. Sinuses/Orbits: Mild mucosal thickening is present in the ethmoid air cells bilaterally. A left pleural effusion is present. No obstructing nasopharyngeal lesion is present. Bilateral lens replacements are present. IMPRESSION: 1. No acute intracranial abnormality or significant interval change. 2. Atrophy and white matter changes are moderately advanced for age. This likely  reflects the sequela of chronic microvascular ischemia. 3. Improved sinus disease. 4. Small bilateral mastoid effusions are stable. These may be chronic. Electronically Signed   By: San Morelle M.D.   On: 06/22/2017 16:49    Procedures Procedures (including critical care time)  Medications Ordered in ED Medications  0.9 %  sodium chloride infusion ( Intravenous New Bag/Given 06/22/17 1237)     Initial Impression / Assessment and Plan / ED Course  I have reviewed the triage vital signs and the nursing notes.  Pertinent labs & imaging results that were available during my care of the patient were reviewed by me and considered in my medical decision making (see chart for details).     Patient with significant improvement but not back to baseline.  Is doing much better.  Never really had any obvious focal deficits.  There was a fair amount of confusion.  Patient without any complaints.  Extensive workup shows no significant electrolyte abnormalities.  Chest x-ray negative urinalysis not no significant anemia.  An MRI of the  brain without evidence of an acute stroke.  Discussed with patient's daughter she still curious why patient is not back to baseline but understands we do not have criteria for admission.  She will stay with her mother in the meantime doctor.  I did offer for a social work consult but she said that would be necessary.  Final Clinical Impressions(s) / ED Diagnoses   Final diagnoses:  Altered mental status, unspecified altered mental status type    ED Discharge Orders    None       Fredia Sorrow, MD 06/22/17 1733

## 2017-06-22 NOTE — ED Notes (Signed)
Patient and her daughter both verbalized understanding of discharge instructions and deny any further needs or questions at this time. Patient's daughter plans on staying with her tonight and having the independent living facility check on patient throughout the day. Patient to see her PCP tomorrow. VS stable. Patient ambulatory with steady gait, assisted to vehicle in wheelchair.

## 2017-06-22 NOTE — ED Notes (Signed)
Patient transported to MRI 

## 2017-06-22 NOTE — ED Notes (Signed)
Patient transported to CT 

## 2017-06-22 NOTE — ED Notes (Signed)
Patient returned to room from MRI. Assisted to restroom via wheelchair, patient able to move from chair to bed without difficulties. Patient still echibits some confusion, but it appears to be better than earlier, pt more talkative and aware of why she is in the ED.

## 2017-06-23 DIAGNOSIS — R296 Repeated falls: Secondary | ICD-10-CM | POA: Diagnosis not present

## 2017-06-23 DIAGNOSIS — M25661 Stiffness of right knee, not elsewhere classified: Secondary | ICD-10-CM | POA: Diagnosis not present

## 2017-06-23 DIAGNOSIS — M223X1 Other derangements of patella, right knee: Secondary | ICD-10-CM | POA: Diagnosis not present

## 2017-06-23 DIAGNOSIS — L989 Disorder of the skin and subcutaneous tissue, unspecified: Secondary | ICD-10-CM | POA: Diagnosis not present

## 2017-06-23 DIAGNOSIS — R262 Difficulty in walking, not elsewhere classified: Secondary | ICD-10-CM | POA: Diagnosis not present

## 2017-06-23 DIAGNOSIS — Z8673 Personal history of transient ischemic attack (TIA), and cerebral infarction without residual deficits: Secondary | ICD-10-CM | POA: Diagnosis not present

## 2017-06-23 DIAGNOSIS — L299 Pruritus, unspecified: Secondary | ICD-10-CM | POA: Diagnosis not present

## 2017-06-23 DIAGNOSIS — M6281 Muscle weakness (generalized): Secondary | ICD-10-CM | POA: Diagnosis not present

## 2017-06-28 DIAGNOSIS — M25661 Stiffness of right knee, not elsewhere classified: Secondary | ICD-10-CM | POA: Diagnosis not present

## 2017-06-28 DIAGNOSIS — M223X1 Other derangements of patella, right knee: Secondary | ICD-10-CM | POA: Diagnosis not present

## 2017-06-28 DIAGNOSIS — R262 Difficulty in walking, not elsewhere classified: Secondary | ICD-10-CM | POA: Diagnosis not present

## 2017-06-28 DIAGNOSIS — M6281 Muscle weakness (generalized): Secondary | ICD-10-CM | POA: Diagnosis not present

## 2017-06-30 DIAGNOSIS — M25661 Stiffness of right knee, not elsewhere classified: Secondary | ICD-10-CM | POA: Diagnosis not present

## 2017-06-30 DIAGNOSIS — M6281 Muscle weakness (generalized): Secondary | ICD-10-CM | POA: Diagnosis not present

## 2017-06-30 DIAGNOSIS — M223X1 Other derangements of patella, right knee: Secondary | ICD-10-CM | POA: Diagnosis not present

## 2017-06-30 DIAGNOSIS — R262 Difficulty in walking, not elsewhere classified: Secondary | ICD-10-CM | POA: Diagnosis not present

## 2017-07-04 DIAGNOSIS — M6281 Muscle weakness (generalized): Secondary | ICD-10-CM | POA: Diagnosis not present

## 2017-07-04 DIAGNOSIS — M25661 Stiffness of right knee, not elsewhere classified: Secondary | ICD-10-CM | POA: Diagnosis not present

## 2017-07-04 DIAGNOSIS — M223X1 Other derangements of patella, right knee: Secondary | ICD-10-CM | POA: Diagnosis not present

## 2017-07-04 DIAGNOSIS — R262 Difficulty in walking, not elsewhere classified: Secondary | ICD-10-CM | POA: Diagnosis not present

## 2017-07-06 DIAGNOSIS — R262 Difficulty in walking, not elsewhere classified: Secondary | ICD-10-CM | POA: Diagnosis not present

## 2017-07-06 DIAGNOSIS — M223X1 Other derangements of patella, right knee: Secondary | ICD-10-CM | POA: Diagnosis not present

## 2017-07-06 DIAGNOSIS — M25661 Stiffness of right knee, not elsewhere classified: Secondary | ICD-10-CM | POA: Diagnosis not present

## 2017-07-06 DIAGNOSIS — M6281 Muscle weakness (generalized): Secondary | ICD-10-CM | POA: Diagnosis not present

## 2017-07-15 DIAGNOSIS — M25661 Stiffness of right knee, not elsewhere classified: Secondary | ICD-10-CM | POA: Diagnosis not present

## 2017-07-15 DIAGNOSIS — M6281 Muscle weakness (generalized): Secondary | ICD-10-CM | POA: Diagnosis not present

## 2017-07-15 DIAGNOSIS — M223X1 Other derangements of patella, right knee: Secondary | ICD-10-CM | POA: Diagnosis not present

## 2017-07-15 DIAGNOSIS — R262 Difficulty in walking, not elsewhere classified: Secondary | ICD-10-CM | POA: Diagnosis not present

## 2017-07-18 DIAGNOSIS — M6281 Muscle weakness (generalized): Secondary | ICD-10-CM | POA: Diagnosis not present

## 2017-07-18 DIAGNOSIS — R262 Difficulty in walking, not elsewhere classified: Secondary | ICD-10-CM | POA: Diagnosis not present

## 2017-07-18 DIAGNOSIS — M25661 Stiffness of right knee, not elsewhere classified: Secondary | ICD-10-CM | POA: Diagnosis not present

## 2017-07-18 DIAGNOSIS — M223X1 Other derangements of patella, right knee: Secondary | ICD-10-CM | POA: Diagnosis not present

## 2017-07-20 DIAGNOSIS — M223X1 Other derangements of patella, right knee: Secondary | ICD-10-CM | POA: Diagnosis not present

## 2017-07-20 DIAGNOSIS — R262 Difficulty in walking, not elsewhere classified: Secondary | ICD-10-CM | POA: Diagnosis not present

## 2017-07-20 DIAGNOSIS — M25661 Stiffness of right knee, not elsewhere classified: Secondary | ICD-10-CM | POA: Diagnosis not present

## 2017-07-20 DIAGNOSIS — M6281 Muscle weakness (generalized): Secondary | ICD-10-CM | POA: Diagnosis not present

## 2017-07-26 DIAGNOSIS — M6281 Muscle weakness (generalized): Secondary | ICD-10-CM | POA: Diagnosis not present

## 2017-07-26 DIAGNOSIS — M25661 Stiffness of right knee, not elsewhere classified: Secondary | ICD-10-CM | POA: Diagnosis not present

## 2017-07-26 DIAGNOSIS — R262 Difficulty in walking, not elsewhere classified: Secondary | ICD-10-CM | POA: Diagnosis not present

## 2017-07-26 DIAGNOSIS — M223X1 Other derangements of patella, right knee: Secondary | ICD-10-CM | POA: Diagnosis not present

## 2017-07-28 DIAGNOSIS — Z85828 Personal history of other malignant neoplasm of skin: Secondary | ICD-10-CM | POA: Diagnosis not present

## 2017-07-28 DIAGNOSIS — G2581 Restless legs syndrome: Secondary | ICD-10-CM | POA: Diagnosis not present

## 2017-07-28 DIAGNOSIS — Z8673 Personal history of transient ischemic attack (TIA), and cerebral infarction without residual deficits: Secondary | ICD-10-CM | POA: Diagnosis not present

## 2017-07-28 DIAGNOSIS — L858 Other specified epidermal thickening: Secondary | ICD-10-CM | POA: Diagnosis not present

## 2017-07-28 DIAGNOSIS — R29818 Other symptoms and signs involving the nervous system: Secondary | ICD-10-CM | POA: Diagnosis not present

## 2017-07-28 DIAGNOSIS — B078 Other viral warts: Secondary | ICD-10-CM | POA: Diagnosis not present

## 2017-07-28 DIAGNOSIS — L308 Other specified dermatitis: Secondary | ICD-10-CM | POA: Diagnosis not present

## 2017-07-28 DIAGNOSIS — L821 Other seborrheic keratosis: Secondary | ICD-10-CM | POA: Diagnosis not present

## 2017-07-29 DIAGNOSIS — M223X1 Other derangements of patella, right knee: Secondary | ICD-10-CM | POA: Diagnosis not present

## 2017-07-29 DIAGNOSIS — M6281 Muscle weakness (generalized): Secondary | ICD-10-CM | POA: Diagnosis not present

## 2017-07-29 DIAGNOSIS — R262 Difficulty in walking, not elsewhere classified: Secondary | ICD-10-CM | POA: Diagnosis not present

## 2017-07-29 DIAGNOSIS — M25661 Stiffness of right knee, not elsewhere classified: Secondary | ICD-10-CM | POA: Diagnosis not present

## 2017-08-02 DIAGNOSIS — M223X1 Other derangements of patella, right knee: Secondary | ICD-10-CM | POA: Diagnosis not present

## 2017-08-02 DIAGNOSIS — M25661 Stiffness of right knee, not elsewhere classified: Secondary | ICD-10-CM | POA: Diagnosis not present

## 2017-08-02 DIAGNOSIS — M6281 Muscle weakness (generalized): Secondary | ICD-10-CM | POA: Diagnosis not present

## 2017-08-02 DIAGNOSIS — R262 Difficulty in walking, not elsewhere classified: Secondary | ICD-10-CM | POA: Diagnosis not present

## 2017-08-04 DIAGNOSIS — R262 Difficulty in walking, not elsewhere classified: Secondary | ICD-10-CM | POA: Diagnosis not present

## 2017-08-04 DIAGNOSIS — M25661 Stiffness of right knee, not elsewhere classified: Secondary | ICD-10-CM | POA: Diagnosis not present

## 2017-08-04 DIAGNOSIS — M6281 Muscle weakness (generalized): Secondary | ICD-10-CM | POA: Diagnosis not present

## 2017-08-04 DIAGNOSIS — M223X1 Other derangements of patella, right knee: Secondary | ICD-10-CM | POA: Diagnosis not present

## 2017-08-09 DIAGNOSIS — M25661 Stiffness of right knee, not elsewhere classified: Secondary | ICD-10-CM | POA: Diagnosis not present

## 2017-08-09 DIAGNOSIS — M6281 Muscle weakness (generalized): Secondary | ICD-10-CM | POA: Diagnosis not present

## 2017-08-09 DIAGNOSIS — M223X1 Other derangements of patella, right knee: Secondary | ICD-10-CM | POA: Diagnosis not present

## 2017-08-09 DIAGNOSIS — R262 Difficulty in walking, not elsewhere classified: Secondary | ICD-10-CM | POA: Diagnosis not present

## 2017-08-11 DIAGNOSIS — M25661 Stiffness of right knee, not elsewhere classified: Secondary | ICD-10-CM | POA: Diagnosis not present

## 2017-08-11 DIAGNOSIS — M6281 Muscle weakness (generalized): Secondary | ICD-10-CM | POA: Diagnosis not present

## 2017-08-11 DIAGNOSIS — R262 Difficulty in walking, not elsewhere classified: Secondary | ICD-10-CM | POA: Diagnosis not present

## 2017-08-11 DIAGNOSIS — M223X1 Other derangements of patella, right knee: Secondary | ICD-10-CM | POA: Diagnosis not present

## 2017-09-05 ENCOUNTER — Ambulatory Visit (INDEPENDENT_AMBULATORY_CARE_PROVIDER_SITE_OTHER): Payer: Medicare Other | Admitting: Neurology

## 2017-09-05 ENCOUNTER — Encounter: Payer: Self-pay | Admitting: Neurology

## 2017-09-05 VITALS — BP 129/76 | HR 69 | Wt 146.4 lb

## 2017-09-05 DIAGNOSIS — G459 Transient cerebral ischemic attack, unspecified: Secondary | ICD-10-CM | POA: Diagnosis not present

## 2017-09-05 DIAGNOSIS — E785 Hyperlipidemia, unspecified: Secondary | ICD-10-CM

## 2017-09-05 DIAGNOSIS — I10 Essential (primary) hypertension: Secondary | ICD-10-CM | POA: Diagnosis not present

## 2017-09-05 DIAGNOSIS — G43109 Migraine with aura, not intractable, without status migrainosus: Secondary | ICD-10-CM | POA: Diagnosis not present

## 2017-09-05 DIAGNOSIS — F05 Delirium due to known physiological condition: Secondary | ICD-10-CM | POA: Diagnosis not present

## 2017-09-05 MED ORDER — CLOPIDOGREL BISULFATE 75 MG PO TABS: 75.0000 mg | ORAL_TABLET | Freq: Every day | ORAL | 3 refills | Status: DC

## 2017-09-05 MED ORDER — PANTOPRAZOLE SODIUM 40 MG PO TBEC: 40.0000 mg | DELAYED_RELEASE_TABLET | Freq: Every day | ORAL | 3 refills | Status: DC

## 2017-09-05 NOTE — Progress Notes (Signed)
NEUROLOGY CLINIC FOLLOW UP PATIENT NOTE  NAME: Cassidy Ellison DOB: October 26, 1921 REFERRING PHYSICIAN: Harlan Stains, MD  I saw Cassidy Ellison as a new consult in the neurovascular clinic today regarding confusion episode.  HPI: Cassidy Ellison is a 84 y.o. female with PMH of HTN who presents as a new patient for confusion episode. She was in her normal health until 06/13/15 when she finished home PT session and was reading PT instructions. She felt that she had hard time to read and understand the instruction. She tried to call her daughter but not able to use the phone. When daughter came, she was able to talk but still has difficulty with phone and mild confusion. When she was in [facility] ER, her symptoms gradually back to baseline. She denies any HA, migraine hx, visual changes, or heart racing. They did not check BP at time of symptoms. She was admitted for evaluation. EEG normal, MRI showed questionable right pontine punctate infarct vs. Artifact. MRA head, CUS, TTE unremarkable. LDL 122 and A1C 6.0. She was discharged with ASA 325 and lipitor 20.   She has Hx of HTN, on norvasc 10mg , cozaar 100mg , and toprol 100mg . She admitted that recently her BP was low, and her normal BP should be 120/60, but today only 94/54.   08/12/15 follow up  - she has been doing well. No recurrent episode. Continued on ASA and lipitor. BP at lower side, today 94/54. Denies any dizziness, diaphoresis. Has followed up with PCP Dr. Chapman Fitch.  06/22/17 ED visit - for confusion in the morning.  She got up as normal, but then was unable to figure out how to make her coffee.  Daughter came over about 3 hours later, she told her daughter that "I cannot do that" and pointed to the TV and to the coffee pot. Cannot name her daughter.  She went to ER, BP 120/57, had CT and MRI brain no acute abnormality.  She was discharged from ER with PCP follow-up.  Symptoms lasted entire day.  Second day as per daughter she also had a very brief similar episode.   She denies headache, weakness, or numbness.  She remembered whole situation.  Went to PCPs second day, increase aspirin 325 from daily to twice daily.  She was referred back here for further evaluation.  Interval history During the interval time, patient has been doing well.  No further episode.  Daughter said she had 2 minor falls during the interval time.  Patient claimed that she had headache 2 days ago and lasted for about 2 days.  The headache is at the left vertex, not constant, achy pain lasting seconds to minutes, even at night.  Pt had 30 day cardiac monitoring negative for Afib. BP today 127/76. She is on norvasc 5, losartan 100 and metoprolol 50 daily.    Past Medical History:  Diagnosis Date  . Carpal tunnel syndrome    left  . DVT (deep venous thrombosis) ([place])   . GERD (gastroesophageal reflux disease)   . Hyperlipemia   . Hypertension   . RLS (restless legs syndrome)   . Stroke [facility])    Past Surgical History:  Procedure Laterality Date  . ABDOMINAL HYSTERECTOMY    . CARPAL TUNNEL RELEASE Left 10/31/2014   Procedure: LEFT CARPAL TUNNEL RELEASE;  Surgeon: Leanora Cover, MD;  Location: [place];  Service: Orthopedics;  Laterality: Left;  . CARPAL TUNNEL RELEASE Right 02/12/2016   Procedure: CARPAL TUNNEL RELEASE, right;  Surgeon:  Leanora Cover, MD;  Location: [place];  Service: Orthopedics;  Laterality: Right;  . GALLBLADDER SURGERY     Family History  Problem Relation Age of Onset  . Heart failure Father   . Heart failure Mother   . Cancer Sister        breast cancer  . Diabetes Brother    Current Outpatient Medications  Medication Sig Dispense Refill  . amLODipine (NORVASC) 5 MG tablet     . atorvastatin (LIPITOR) 20 MG tablet     . Calcium Carbonate-Vit D-Min (CALCIUM 1200 PO) Take 1,200 mg by mouth daily.    . Cholecalciferol (VITAMIN D-3) 1000 units CAPS Take 1,000 Units by mouth daily.    Marland Kitchen losartan (COZAAR) 100 MG tablet Take 100  mg by mouth daily.    . metoprolol succinate (TOPROL-XL) 50 MG 24 hr tablet Take 1 tablet (50 mg total) by mouth daily. 30 tablet 3  . metoprolol succinate (TOPROL-XL) 50 MG 24 hr tablet Take by mouth.    . Multiple Vitamins-Minerals (MULTIVITAMIN WITH MINERALS) tablet Take by mouth.    Marland Kitchen rOPINIRole (REQUIP) 0.25 MG tablet Take 1 tablet (0.25 mg total) by mouth at bedtime. 30 tablet 0  . vitamin C (ASCORBIC ACID) 500 MG tablet Take 500 mg by mouth daily.    . clopidogrel (PLAVIX) 75 MG tablet Take 1 tablet (75 mg total) by mouth daily. 90 tablet 3  . pantoprazole (PROTONIX) 40 MG tablet Take 1 tablet (40 mg total) by mouth daily. 90 tablet 3   No current facility-administered medications for this visit.    Allergies  Allergen Reactions  . Adhesive [Tape] Itching  . Chlorthalidone Other (See Comments)    creatinine  increases  . Hydrochlorothiazide Other (See Comments)    CREATNINE INCREASES CREATNINE INCREASES  . Oxsoralen [Methoxsalen]    Social History   Socioeconomic History  . Marital status: Widowed    Spouse name: Not on file  . Number of children: Not on file  . Years of education: Not on file  . Highest education level: Not on file  Occupational History  . Not on file  Social Needs  . Financial resource strain: Not on file  . Food insecurity:    Worry: Not on file    Inability: Not on file  . Transportation needs:    Medical: Not on file    Non-medical: Not on file  Tobacco Use  . Smoking status: Former Research scientist (life sciences)  . Smokeless tobacco: Never Used  . Tobacco comment: quit 20years ago  Substance and Sexual Activity  . Alcohol use: Yes    Comment: social events  . Drug use: No  . Sexual activity: Not on file  Lifestyle  . Physical activity:    Days per week: Not on file    Minutes per session: Not on file  . Stress: Not on file  Relationships  . Social connections:    Talks on phone: Not on file    Gets together: Not on file    Attends religious service: Not  on file    Active member of club or organization: Not on file    Attends meetings of clubs or organizations: Not on file    Relationship status: Not on file  . Intimate partner violence:    Fear of current or ex partner: Not on file    Emotionally abused: Not on file    Physically abused: Not on file    Forced sexual activity: Not  on file  Other Topics Concern  . Not on file  Social History Narrative  . Not on file    Review of Systems Full 14 system review of systems performed and notable only for those listed, all others are neg:  Constitutional:   Cardiovascular:  Ear/Nose/Throat:   Skin:  Eyes:   Respiratory:   Gastroitestinal:   Genitourinary:  Hematology/Lymphatic:   Endocrine:  Musculoskeletal:   Allergy/Immunology:   Neurological:  Speech difficulty Psychiatric:  Sleep:    Physical Exam  Vitals:   09/05/17 0848  BP: 129/76  Pulse: 69    General - Well nourished, well developed, in no apparent distress.  Ophthalmologic - Sharp disc margins OU.  Cardiovascular - Regular rate and rhythm with no murmur. Carotid pulses were 2+ without bruits .   Neck - supple, no nuchal rigidity .  Mental Status -  Level of arousal and orientation to time, place, and person were intact. Language including expression, naming, repetition, comprehension, reading, and writing was assessed and found intact. Attention span and concentration were normal. Recent and remote memory were intact. Fund of Knowledge was assessed and was intact.  Cranial Nerves II - XII - II - Visual field intact OU. III, IV, VI - Extraocular movements intact. V - Facial sensation intact bilaterally. VII - Facial movement intact bilaterally. VIII - Hearing & vestibular intact bilaterally. X - Palate elevates symmetrically. XI - Chin turning & shoulder shrug intact bilaterally. XII - Tongue protrusion intact.  Motor Strength - The patient's strength was normal in all extremities and pronator  drift was absent.  Bulk was normal and fasciculations were absent.   Motor Tone - Muscle tone was assessed at the neck and appendages and was normal.  Reflexes - The patient's reflexes were normal in all extremities and she had no pathological reflexes.  Sensory - Light touch, temperature/pinprick were assessed and were normal.    Coordination - The patient had normal movements in the hands and feet with no ataxia or dysmetria.  Tremor was absent.  Gait and Station - The patient's transfers, posture, gait, station, and turns were observed as normal.   Imaging  I have personally reviewed the radiological images below and agree with the radiology interpretations.  Dg Chest 2 View 06/13/2015  Probable COPD changes. No acute abnormalities.  Ct Head Wo Contrast 06/13/2015  No acute intracranial process. Chronic microvascular ischemic changes.   Mr Jodene Nam Head/brain Wo Cm 06/14/2015   MRI HEAD:  Punctate focus of acute ischemia RIGHT cerebral peduncle versus artifact. Otherwise stable appearance of the brain from May 16, 2015 including involutional changes and mild to moderate chronic small vessel ischemic disease.   MRA HEAD:  No acute large vessel occlusion or high-grade stenosis. 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   CUS - Bilateral: 1-39% ICA stenosis. Vertebral artery flow is antegrade.  TTE - - Left ventricle: The cavity size was normal. Systolic function was  normal. The estimated ejection fraction was in the range of 60%  to 65%. Wall motion was normal; there were no regional wall  motion abnormalities. Left ventricular diastolic function  parameters were normal. - Atrial septum: No defect or patent foramen ovale was identified.  30 day cardiac event monitoring Sinus rhythm No atrial fibrillation No arrhythmias  Ct Head Wo Contrast 06/22/2017 CLINICAL DATA:  Altered level of consciousness.  History of stroke. EXAM: CT HEAD  WITHOUT CONTRAST TECHNIQUE: Contiguous axial images were obtained from the  base of the skull through the vertex without intravenous contrast. COMPARISON:  CT head 06/13/2015.  MRI brain 05/20/2017. FINDINGS: Brain: There is no evidence of acute intracranial hemorrhage, mass lesion, brain edema or extra-axial fluid collection. Stable chronic atrophy with prominence of the ventricles and subarachnoid spaces. There is mild chronic periventricular white matter disease. There is no CT evidence of acute cortical infarction. Vascular: Intracranial vascular calcifications. No hyperdense vessel identified. Skull: Negative for fracture or focal lesion. Sinuses/Orbits: The visualized paranasal sinuses and mastoid air cells are clear. No orbital abnormalities are seen. Other: None. IMPRESSION: No acute intracranial findings. Stable atrophy and chronic small vessel ischemic changes, mild for age. Electronically Signed   By: Richardean Sale M.D.   On: 06/22/2017 12:14   Mr Brain Wo Contrast (neuro Protocol) 06/22/2017 CLINICAL DATA:  Altered mental status. Symptoms have near completely resolved. Some persistent word-finding difficulty. EXAM: MRI HEAD WITHOUT CONTRAST TECHNIQUE: Multiplanar, multiecho pulse sequences of the brain and surrounding structures were obtained without intravenous contrast. COMPARISON:  CT head without contrast 06/22/2017. MRI brain 05/20/2017. FINDINGS: Brain: No acute infarct, hemorrhage, or mass lesion is present. The ventricles are of normal size. Moderate atrophy and white matter changes bilaterally are stable. No acute hemorrhage or mass lesion is present. Ventricles are proportionate to the degree of atrophy. No significant extra-axial fluid collection is present. White matter changes extend into the brainstem. The cerebellum is unremarkable. Internal auditory canals are within normal limits bilaterally. Vascular: Flow is present in the major intracranial arteries. Skull and upper cervical  spine: The skull base is within normal limits. The craniocervical junction is normal. Mild degenerative changes are present in the upper cervical spine. Marrow signal is normal. Midline sagittal structures are unremarkable. Sinuses/Orbits: Mild mucosal thickening is present in the ethmoid air cells bilaterally. A left pleural effusion is present. No obstructing nasopharyngeal lesion is present. Bilateral lens replacements are present. IMPRESSION: 1. No acute intracranial abnormality or significant interval change. 2. Atrophy and white matter changes are moderately advanced for age. This likely reflects the sequela of chronic microvascular ischemia. 3. Improved sinus disease. 4. Small bilateral mastoid effusions are stable. These may be chronic. Electronically Signed   By: San Morelle M.D.   On: 06/22/2017 16:49   Lab Review Component     Latest Ref Rng 06/14/2015  Cholesterol     0 - 200 mg/dL 189  Triglycerides     <150 mg/dL 68  HDL Cholesterol     >40 mg/dL 53  Total CHOL/HDL Ratio      3.6  VLDL     0 - 40 mg/dL 14  LDL (calc)     0 - 99 mg/dL 122 (H)  Hemoglobin A1C     4.8 - 5.6 % 6.0 (H)  Mean Plasma Glucose      126     Assessment    In summary, Cassidy Ellison is a 84 y.o. female with PMH of HTN who had confusion episode on 06/13/15, resolved in ER. She denies any HA or migraine hx. EEG normal, MRI showed questionable right pontine punctate infarct vs. Artifact. MRA head, CUS, TTE unremarkable. LDL 122 and A1C 6.0. She was discharged with ASA 325 and lipitor 20. Her BP was low last visit and metoprolol dose decreased, continued norvasc and losartan. DDX including TIA, migraine equivalent, hypotension, or seizure. Had 30 day cardiac monitoring which was negative. Had again similar confusion episode more with anomia on 06/22/17, lasting longer than last time. CT/MRI negative.  No HA or seizure like activity. PCP increased ASA 325 to bid. Recently had mild and insignificant left vertex  brief HA. Her recurrent episodes concerning for TIA vs. Complicated migraine without HA. Seizure less likely. Previous EEG normal.   Plan: - change ASA to plavix for stroke prevention. - change Pepcid to protonix to decrease interaction with plavix. - continue lipitor for stroke prevention. - check BP at home and record. - repeat CUS and TTE for stroke work up again  - do not think EEG necessary at this time - Follow up with your primary care physician for stroke risk factor modification. Recommend maintain blood pressure goal around 120/80, diabetes with hemoglobin A1c goal below 6.5% and lipids with LDL cholesterol goal below 70 mg/dL.  - healthy diet and regular exercise. Avoid fall.  - follow up in 3 months with Janett Billow  I spent more than 25 minutes of face to face time with the patient. Greater than 50% of time was spent in counseling and coordination of care. We discussed DDx of confusion episode, again stroke work up and change ASA to plavix.  Orders Placed This Encounter  Procedures  . ECHOCARDIOGRAM COMPLETE    Standing Status:   Future    Standing Expiration Date:   12/05/2018    Order Specific Question:   Where should this test be performed    Answer:   [facility])    Order Specific Question:   Does the patient weigh less than or greater than 250 lbs?    Answer:   Patient weighs less than 250 lbs    Order Specific Question:   Complete or Limited study?    Answer:   Complete    Order Specific Question:   With Image Enhancing Agent or without Image Enhancing Agent?    Answer:   With Image Enhancing Agent    Order Specific Question:   Reason for exam-Echo    Answer:   TIA  435.9 / G45.9    Meds ordered this encounter  Medications  . clopidogrel (PLAVIX) 75 MG tablet    Sig: Take 1 tablet (75 mg total) by mouth daily.    Dispense:  90 tablet    Refill:  3  . pantoprazole (PROTONIX) 40 MG tablet    Sig: Take 1 tablet (40 mg total) by mouth daily.     Dispense:  90 tablet    Refill:  3    Patient Instructions  - change ASA to plavix for stroke prevention. - continue lipitor for stroke prevention. - check BP at home and record. - repeat carotid doppler and heart ultrasound  - do not think EEG necessary at this time - Follow up with your primary care physician for stroke risk factor modification. Recommend maintain blood pressure goal around 120/80, diabetes with hemoglobin A1c goal below 6.5% and lipids with LDL cholesterol goal below 70 mg/dL.  - healthy diet and regular exercise. Avoid fall.  - follow up in 3 months with Ace Gins, MD PhD [id number] [facility] [address], [place], [place] [address] [phone]

## 2017-09-05 NOTE — Patient Instructions (Signed)
-   change ASA to plavix for stroke prevention. - continue lipitor for stroke prevention. - check BP at home and record. - repeat carotid doppler and heart ultrasound  - do not think EEG necessary at this time - Follow up with your primary care physician for stroke risk factor modification. Recommend maintain blood pressure goal around 120/80, diabetes with hemoglobin A1c goal below 6.5% and lipids with LDL cholesterol goal below 70 mg/dL.  - healthy diet and regular exercise. Avoid fall.  - follow up in 3 months with Janett Billow

## 2017-09-13 ENCOUNTER — Ambulatory Visit (HOSPITAL_COMMUNITY)
Admission: RE | Admit: 2017-09-13 | Discharge: 2017-09-13 | Disposition: A | Discharge status: Home / Self Care | Payer: Medicare Other | Source: Ambulatory Visit | Attending: Neurology | Admitting: Neurology

## 2017-09-13 DIAGNOSIS — R1110 Vomiting, unspecified: Secondary | ICD-10-CM | POA: Diagnosis not present

## 2017-09-13 DIAGNOSIS — G459 Transient cerebral ischemic attack, unspecified: Secondary | ICD-10-CM | POA: Insufficient documentation

## 2017-09-13 DIAGNOSIS — I10 Essential (primary) hypertension: Secondary | ICD-10-CM | POA: Diagnosis not present

## 2017-09-13 DIAGNOSIS — E785 Hyperlipidemia, unspecified: Secondary | ICD-10-CM | POA: Diagnosis not present

## 2017-09-13 DIAGNOSIS — I6523 Occlusion and stenosis of bilateral carotid arteries: Secondary | ICD-10-CM | POA: Diagnosis not present

## 2017-09-13 NOTE — Progress Notes (Signed)
*  PRELIMINARY RESULTS* Vascular Ultrasound Carotid Duplex (Doppler) has been completed.   Findings suggest 1-39% internal carotid artery stenosis bilaterally. Vertebral arteries are patent with antegrade flow.  09/13/2017 3:22 PM Maudry Mayhew, BS, RVT, RDCS, RDMS

## 2017-09-13 NOTE — Progress Notes (Signed)
  Echocardiogram 2D Echocardiogram has been performed.  Cassidy Ellison 09/13/2017, 2:59 PM

## 2017-09-14 ENCOUNTER — Encounter: Payer: Self-pay | Admitting: Podiatry

## 2017-09-14 ENCOUNTER — Telehealth: Payer: Self-pay

## 2017-09-14 ENCOUNTER — Ambulatory Visit (INDEPENDENT_AMBULATORY_CARE_PROVIDER_SITE_OTHER): Payer: Medicare Other | Admitting: Podiatry

## 2017-09-14 DIAGNOSIS — L84 Corns and callosities: Secondary | ICD-10-CM | POA: Diagnosis not present

## 2017-09-14 DIAGNOSIS — B351 Tinea unguium: Secondary | ICD-10-CM

## 2017-09-14 DIAGNOSIS — M79676 Pain in unspecified toe(s): Secondary | ICD-10-CM

## 2017-09-14 DIAGNOSIS — D689 Coagulation defect, unspecified: Secondary | ICD-10-CM

## 2017-09-14 DIAGNOSIS — M79609 Pain in unspecified limb: Secondary | ICD-10-CM

## 2017-09-14 NOTE — Telephone Encounter (Signed)
Notes recorded by Marval Regal, RN on 09/14/2017 at 11:21 AM EDT RN stated test was normal, unremarkable. Pt verbalized understanding. ------

## 2017-09-14 NOTE — Progress Notes (Signed)
Subjective:   Patient ID: Cassidy Ellison, female   DOB: 84 y.o.   MRN: [id number]   HPI Patient presents with nail disease 1-5 both feet and lesions of the second and third toes distal of both feet that are painful with history of being on blood thinner   ROS      Objective:  Physical Exam  Neurovascular status unchanged with thick yellow brittle nailbeds 1-5 both feet that are painful and she cannot cut and lesions bilateral that are very painful and make it hard for her to wear shoe gear or walk comfortably     Assessment:  Patient who has risk factor of being on blood thinner with chronic painful nail disease 1-5 both feet and lesions with digital deformity of both feet     Plan:  H&P conditions reviewed and debrided nailbeds 1-5 both feet and lesions both feet with no iatrogenic bleeding noted.  Advised on aggressive conservative treatment and earlier if any issues should occur returning in 9 weeks for

## 2017-09-14 NOTE — Telephone Encounter (Signed)
-----   Message from Rosalin Hawking, MD sent at 09/13/2017  4:12 PM EDT ----- Could you please let the patient know that the heart ultrasound test done recently was unremarkable. Please continue current treatment. Thanks.  Rosalin Hawking, MD PhD Stroke Neurology 09/13/2017 4:12 PM

## 2017-09-14 NOTE — Telephone Encounter (Signed)
Heart ultrasound report.

## 2017-09-14 NOTE — Telephone Encounter (Signed)
Notes recorded by Marval Regal, RN on 09/14/2017 at 11:13 AM EDT Rn call patient about her Vas carotid ultrasound report. Rn stated the test was unremarkable negative for any carotid artery narrowing. Continue treatment plan. Pt wanted a copy of the report. RN stated she will have to come in and sign a release form to get copy of report. Pt verbalized understanding. ------

## 2017-09-14 NOTE — Telephone Encounter (Signed)
-----   Message from Rosalin Hawking, MD sent at 09/13/2017 11:04 PM EDT ----- Could you please let the patient know that the carotid ultrasound test done recently was unremarkable, negative for carotid artery narrowing. Please continue current treatment. Thanks.  Rosalin Hawking, MD PhD Stroke Neurology 09/13/2017 11:04 PM

## 2017-10-28 DIAGNOSIS — Z8673 Personal history of transient ischemic attack (TIA), and cerebral infarction without residual deficits: Secondary | ICD-10-CM | POA: Diagnosis not present

## 2017-10-28 DIAGNOSIS — G43109 Migraine with aura, not intractable, without status migrainosus: Secondary | ICD-10-CM | POA: Diagnosis not present

## 2017-10-28 DIAGNOSIS — R0989 Other specified symptoms and signs involving the circulatory and respiratory systems: Secondary | ICD-10-CM | POA: Diagnosis not present

## 2017-10-28 DIAGNOSIS — R7303 Prediabetes: Secondary | ICD-10-CM | POA: Diagnosis not present

## 2017-10-28 DIAGNOSIS — I10 Essential (primary) hypertension: Secondary | ICD-10-CM | POA: Diagnosis not present

## 2017-10-28 DIAGNOSIS — Z23 Encounter for immunization: Secondary | ICD-10-CM | POA: Diagnosis not present

## 2017-10-28 DIAGNOSIS — E785 Hyperlipidemia, unspecified: Secondary | ICD-10-CM | POA: Diagnosis not present

## 2017-11-01 ENCOUNTER — Ambulatory Visit
Admission: RE | Admit: 2017-11-01 | Discharge: 2017-11-01 | Disposition: A | Discharge status: Home / Self Care | Payer: Medicare Other | Source: Ambulatory Visit | Attending: Family Medicine | Admitting: Family Medicine

## 2017-11-01 ENCOUNTER — Inpatient Hospital Stay (HOSPITAL_COMMUNITY)
Admission: EM | Admit: 2017-11-01 | Discharge: 2017-11-05 | DRG: 640 | Disposition: A | Discharge status: Home Health | Payer: Medicare Other | Attending: Internal Medicine | Admitting: Internal Medicine

## 2017-11-01 ENCOUNTER — Other Ambulatory Visit: Payer: Self-pay | Admitting: Family Medicine

## 2017-11-01 ENCOUNTER — Other Ambulatory Visit: Payer: Self-pay

## 2017-11-01 ENCOUNTER — Encounter (HOSPITAL_COMMUNITY): Payer: Self-pay

## 2017-11-01 DIAGNOSIS — J40 Bronchitis, not specified as acute or chronic: Secondary | ICD-10-CM | POA: Diagnosis present

## 2017-11-01 DIAGNOSIS — Z91048 Other nonmedicinal substance allergy status: Secondary | ICD-10-CM

## 2017-11-01 DIAGNOSIS — Z888 Allergy status to other drugs, medicaments and biological substances status: Secondary | ICD-10-CM

## 2017-11-01 DIAGNOSIS — Z86718 Personal history of other venous thrombosis and embolism: Secondary | ICD-10-CM | POA: Diagnosis not present

## 2017-11-01 DIAGNOSIS — E871 Hypo-osmolality and hyponatremia: Secondary | ICD-10-CM | POA: Diagnosis not present

## 2017-11-01 DIAGNOSIS — R062 Wheezing: Secondary | ICD-10-CM | POA: Diagnosis not present

## 2017-11-01 DIAGNOSIS — G9341 Metabolic encephalopathy: Secondary | ICD-10-CM | POA: Diagnosis present

## 2017-11-01 DIAGNOSIS — G2581 Restless legs syndrome: Secondary | ICD-10-CM | POA: Diagnosis present

## 2017-11-01 DIAGNOSIS — Z90710 Acquired absence of both cervix and uterus: Secondary | ICD-10-CM

## 2017-11-01 DIAGNOSIS — Z7902 Long term (current) use of antithrombotics/antiplatelets: Secondary | ICD-10-CM | POA: Diagnosis not present

## 2017-11-01 DIAGNOSIS — R410 Disorientation, unspecified: Secondary | ICD-10-CM | POA: Diagnosis not present

## 2017-11-01 DIAGNOSIS — E860 Dehydration: Secondary | ICD-10-CM | POA: Diagnosis present

## 2017-11-01 DIAGNOSIS — I10 Essential (primary) hypertension: Secondary | ICD-10-CM | POA: Diagnosis present

## 2017-11-01 DIAGNOSIS — Z66 Do not resuscitate: Secondary | ICD-10-CM | POA: Diagnosis present

## 2017-11-01 DIAGNOSIS — Z79899 Other long term (current) drug therapy: Secondary | ICD-10-CM | POA: Diagnosis not present

## 2017-11-01 DIAGNOSIS — Z87891 Personal history of nicotine dependence: Secondary | ICD-10-CM | POA: Diagnosis not present

## 2017-11-01 DIAGNOSIS — R0600 Dyspnea, unspecified: Secondary | ICD-10-CM | POA: Diagnosis not present

## 2017-11-01 DIAGNOSIS — K219 Gastro-esophageal reflux disease without esophagitis: Secondary | ICD-10-CM | POA: Diagnosis present

## 2017-11-01 DIAGNOSIS — R05 Cough: Secondary | ICD-10-CM | POA: Diagnosis not present

## 2017-11-01 DIAGNOSIS — Z8249 Family history of ischemic heart disease and other diseases of the circulatory system: Secondary | ICD-10-CM

## 2017-11-01 DIAGNOSIS — Z8673 Personal history of transient ischemic attack (TIA), and cerebral infarction without residual deficits: Secondary | ICD-10-CM | POA: Diagnosis not present

## 2017-11-01 DIAGNOSIS — E785 Hyperlipidemia, unspecified: Secondary | ICD-10-CM | POA: Diagnosis present

## 2017-11-01 DIAGNOSIS — R32 Unspecified urinary incontinence: Secondary | ICD-10-CM | POA: Diagnosis not present

## 2017-11-01 LAB — CBC WITH DIFFERENTIAL/PLATELET
BASOS ABS: 0 10*3/uL (ref 0.0–0.1)
Basophils Relative: 0 %
EOS PCT: 0 %
Eosinophils Absolute: 0 10*3/uL (ref 0.0–0.7)
HCT: 34.2 % — ABNORMAL LOW (ref 36.0–46.0)
HEMOGLOBIN: 12.1 g/dL (ref 12.0–15.0)
LYMPHS ABS: 0.5 10*3/uL — AB (ref 0.7–4.0)
LYMPHS PCT: 8 %
MCH: 27.9 pg (ref 26.0–34.0)
MCHC: 35.4 g/dL (ref 30.0–36.0)
MCV: 78.8 fL (ref 78.0–100.0)
Monocytes Absolute: 0.1 10*3/uL (ref 0.1–1.0)
Monocytes Relative: 2 %
NEUTROS ABS: 5.9 10*3/uL (ref 1.7–7.7)
NEUTROS PCT: 90 %
PLATELETS: 190 10*3/uL (ref 150–400)
RBC: 4.34 MIL/uL (ref 3.87–5.11)
RDW: 12.1 % (ref 11.5–15.5)
WBC: 6.5 10*3/uL (ref 4.0–10.5)

## 2017-11-01 LAB — COMPREHENSIVE METABOLIC PANEL
ALT: 23 U/L (ref 0–44)
AST: 34 U/L (ref 15–41)
Albumin: 3.7 g/dL (ref 3.5–5.0)
Alkaline Phosphatase: 76 U/L (ref 38–126)
Anion gap: 10 (ref 5–15)
BILIRUBIN TOTAL: 0.9 mg/dL (ref 0.3–1.2)
BUN: 12 mg/dL (ref 8–23)
CHLORIDE: 83 mmol/L — AB (ref 98–111)
CO2: 23 mmol/L (ref 22–32)
Calcium: 9.1 mg/dL (ref 8.9–10.3)
Creatinine, Ser: 0.53 mg/dL (ref 0.44–1.00)
GFR calc Af Amer: >60 mL/min (ref >60)
Glucose, Bld: 136 mg/dL — ABNORMAL HIGH (ref 70–99)
POTASSIUM: 3.9 mmol/L (ref 3.5–5.1)
Sodium: 116 mmol/L — CL (ref 135–145)
TOTAL PROTEIN: 7.1 g/dL (ref 6.5–8.1)

## 2017-11-01 LAB — URINALYSIS, ROUTINE W REFLEX MICROSCOPIC
Bilirubin Urine: NEGATIVE
Glucose, UA: NEGATIVE mg/dL
Hgb urine dipstick: NEGATIVE
KETONES UR: NEGATIVE mg/dL
LEUKOCYTES UA: NEGATIVE
NITRITE: NEGATIVE
PH: 7 (ref 5.0–8.0)
PROTEIN: NEGATIVE mg/dL
Specific Gravity, Urine: 1.005 (ref 1.005–1.030)

## 2017-11-01 LAB — SODIUM, URINE, RANDOM: SODIUM UR: 23 mmol/L

## 2017-11-01 MED ORDER — AMLODIPINE BESYLATE 5 MG PO TABS
5.0000 mg | ORAL_TABLET | Freq: Every day | ORAL | Status: DC
  Administered 2017-11-01 – 2017-11-04 (×4): 5 mg via ORAL
  Filled 2017-11-01 (×4): qty 1

## 2017-11-01 MED ORDER — ONDANSETRON HCL 4 MG PO TABS: 4.0000 mg | ORAL_TABLET | Freq: Four times a day (QID) | ORAL | Status: DC | PRN

## 2017-11-01 MED ORDER — CLOPIDOGREL BISULFATE 75 MG PO TABS
75.0000 mg | ORAL_TABLET | Freq: Every day | ORAL | Status: DC
  Administered 2017-11-02 – 2017-11-05 (×4): 75 mg via ORAL
  Filled 2017-11-01 (×4): qty 1

## 2017-11-01 MED ORDER — ACETAMINOPHEN 650 MG RE SUPP: 650.0000 mg | Freq: Four times a day (QID) | RECTAL | Status: DC | PRN

## 2017-11-01 MED ORDER — SODIUM CHLORIDE 1 G PO TABS
2.0000 g | ORAL_TABLET | Freq: Two times a day (BID) | ORAL | Status: DC
  Administered 2017-11-02 – 2017-11-05 (×7): 2 g via ORAL
  Filled 2017-11-01 (×7): qty 2

## 2017-11-01 MED ORDER — METOPROLOL SUCCINATE ER 50 MG PO TB24
50.0000 mg | ORAL_TABLET | Freq: Every day | ORAL | Status: DC
  Administered 2017-11-02 – 2017-11-05 (×4): 50 mg via ORAL
  Filled 2017-11-01 (×4): qty 1

## 2017-11-01 MED ORDER — PANTOPRAZOLE SODIUM 40 MG PO TBEC
40.0000 mg | DELAYED_RELEASE_TABLET | Freq: Every day | ORAL | Status: DC
  Administered 2017-11-02 – 2017-11-05 (×4): 40 mg via ORAL
  Filled 2017-11-01 (×4): qty 1

## 2017-11-01 MED ORDER — ROPINIROLE HCL 1 MG PO TABS
1.0000 mg | ORAL_TABLET | Freq: Every day | ORAL | Status: DC
  Administered 2017-11-01 – 2017-11-04 (×4): 1 mg via ORAL
  Filled 2017-11-01 (×4): qty 1

## 2017-11-01 MED ORDER — ATORVASTATIN CALCIUM 20 MG PO TABS
20.0000 mg | ORAL_TABLET | Freq: Every day | ORAL | Status: DC
  Administered 2017-11-02 – 2017-11-05 (×4): 20 mg via ORAL
  Filled 2017-11-01 (×4): qty 1

## 2017-11-01 MED ORDER — PREDNISONE 20 MG PO TABS
40.0000 mg | ORAL_TABLET | Freq: Every day | ORAL | Status: DC
  Administered 2017-11-02 – 2017-11-05 (×4): 40 mg via ORAL
  Filled 2017-11-01 (×4): qty 2

## 2017-11-01 MED ORDER — ACETAMINOPHEN 325 MG PO TABS: 650.0000 mg | ORAL_TABLET | Freq: Four times a day (QID) | ORAL | Status: DC | PRN

## 2017-11-01 MED ORDER — ENOXAPARIN SODIUM 40 MG/0.4ML ~~LOC~~ SOLN
40.0000 mg | Freq: Every day | SUBCUTANEOUS | Status: DC
  Administered 2017-11-01 – 2017-11-04 (×4): 40 mg via SUBCUTANEOUS
  Filled 2017-11-01 (×4): qty 0.4

## 2017-11-01 MED ORDER — ONDANSETRON HCL 4 MG/2ML IJ SOLN: 4.0000 mg | Freq: Four times a day (QID) | INTRAMUSCULAR | Status: DC | PRN

## 2017-11-01 NOTE — ED Provider Notes (Signed)
[facility] DEPT Provider Note   CSN: [id number] Arrival date & time: 11/01/17  1745     History   Chief Complaint Chief Complaint  Patient presents with  . Abnormal Lab    Na-113    HPI Cassidy Ellison is a 84 y.o. female.  HPI 84 year old female presents with a sodium of 113.  History taken from patient and daughter.  The patient was seen at the [place] office for a checkup on 6/21.  Patient received a shingles vaccination.  At that point her labs were also obtained and her sodium was about 128.  The patient started having some intermittent confusion over the weekend which was attributed to dextromethorphan. She is been having some wheezing and cough for weeks recently but also years on and off.  She is become progressively more weak and still having some confusion despite stopping the medicine.  She is been hallucinating.  No fevers.  Went back to the PCP today and was given prednisone given the wheezing status and the repeat labs drawn.  Called because the sodium was 113 and told to go to the hospital.  She had a similar significant hyponatremia with the required admission but that time she had the flu multiple years ago.  Past Medical History:  Diagnosis Date  . Carpal tunnel syndrome    left  . DVT (deep venous thrombosis) ([place])   . GERD (gastroesophageal reflux disease)   . Hyperlipemia   . Hypertension   . RLS (restless legs syndrome)     Patient Active Problem List   Diagnosis Date Noted  . Palpitations 08/12/2015  . HTN (hypertension) 08/12/2015  . Acute confusional state 08/12/2015  . Cerebrovascular accident (CVA) ([place])   . Hyperlipidemia   . TIA (transient ischemic attack) 06/13/2015  . Influenza with pneumonia 05/17/2015  . Essential hypertension 05/15/2015  . GERD (gastroesophageal reflux disease) 05/15/2015  . Cough 05/15/2015  . Complicated migraine 97/98/9211  . RLS (restless legs syndrome)   . Hyponatremia   . Weakness     . Dehydration 05/11/2015  . UTI (lower urinary tract infection) 05/11/2015  . Acute bronchitis with bronchospasm 05/11/2015    Past Surgical History:  Procedure Laterality Date  . ABDOMINAL HYSTERECTOMY    . CARPAL TUNNEL RELEASE Left 10/31/2014   Procedure: LEFT CARPAL TUNNEL RELEASE;  Surgeon: Leanora Cover, MD;  Location: [facility];  Service: Orthopedics;  Laterality: Left;  . CARPAL TUNNEL RELEASE Right 02/12/2016   Procedure: CARPAL TUNNEL RELEASE, right;  Surgeon: Leanora Cover, MD;  Location: [facility];  Service: Orthopedics;  Laterality: Right;  . GALLBLADDER SURGERY       OB History   None      Home Medications    Prior to Admission medications   Medication Sig Start Date End Date Taking? Authorizing Provider  amLODipine (NORVASC) 5 MG tablet Take 5 mg by mouth at bedtime.  08/19/17  Yes [provider]  atorvastatin (LIPITOR) 20 MG tablet Take 20 mg by mouth daily.  07/28/17  Yes [provider]  Calcium Carbonate (CALCIUM 600 PO) Take 600 mg by mouth daily.   Yes [provider]  Cholecalciferol (VITAMIN D-3) 1000 units CAPS Take 1,000 Units by mouth daily.   Yes [provider]  clopidogrel (PLAVIX) 75 MG tablet Take 1 tablet (75 mg total) by mouth daily. 09/05/17  Yes Rosalin Hawking, MD  losartan (COZAAR) 100 MG tablet Take 100 mg by mouth daily.  Yes [provider]  metoprolol succinate (TOPROL-XL) 50 MG 24 hr tablet Take 1 tablet (50 mg total) by mouth daily. 08/12/15  Yes Rosalin Hawking, MD  Multiple Vitamins-Minerals (MULTIVITAMIN WITH MINERALS) tablet Take 1 tablet by mouth daily.    Yes [provider]  pantoprazole (PROTONIX) 40 MG tablet Take 1 tablet (40 mg total) by mouth daily. 09/05/17  Yes Rosalin Hawking, MD  rOPINIRole (REQUIP) 1 MG tablet Take 1 mg by mouth at bedtime.   Yes [provider]  vitamin C (ASCORBIC ACID) 500 MG tablet Take 500 mg by mouth daily.   Yes [provider]    Family History Family History  Problem Relation Age of Onset  . Heart failure Father   . Heart failure Mother   . Cancer Sister        breast cancer  . Diabetes Brother     Social History Social History   Tobacco Use  . Smoking status: Former Research scientist (life sciences)  . Smokeless tobacco: Never Used  . Tobacco comment: quit 20years ago  Substance Use Topics  . Alcohol use: Yes    Comment: social events  . Drug use: No     Allergies   Adhesive [tape]; Chlorthalidone; Hydrochlorothiazide; and Oxsoralen [methoxsalen]   Review of Systems Review of Systems  Constitutional: Negative for fever.  Respiratory: Positive for wheezing. Negative for shortness of breath.   Cardiovascular: Negative for chest pain.  Gastrointestinal: Negative for abdominal pain and vomiting.  Psychiatric/Behavioral: Positive for confusion.  All other systems reviewed and are negative.    Physical Exam Updated Vital Signs BP 134/65   Pulse 73   Temp 97.8 F (36.6 C) (Oral)   Resp 15   Ht 5\' 5"  (1.651 m)   Wt 65.8 kg (145 lb)   SpO2 94%   BMI 24.13 kg/m   Physical Exam  Constitutional: She is oriented to person, place, and time. She appears well-developed and well-nourished. No distress.  HENT:  Head: Normocephalic and atraumatic.  Right Ear: External ear normal.  Left Ear: External ear normal.  Nose: Nose normal.  Eyes: Right eye exhibits no discharge. Left eye exhibits no discharge.  Cardiovascular: Normal rate, regular rhythm and normal heart sounds.  Pulmonary/Chest: Effort normal. She has wheezes (diffuse, expiratory).  Abdominal: Soft. There is no tenderness.  Neurological: She is alert and oriented to person, place, and time.  CN 3-12 grossly intact. 5/5 strength in all 4 extremities. Grossly normal sensation. Normal finger to nose.   Skin: Skin is warm and dry. She is not diaphoretic.  Nursing note and vitals reviewed.    ED Treatments / Results  Labs (all labs ordered  are listed, but only abnormal results are displayed) Labs Reviewed  COMPREHENSIVE METABOLIC PANEL - Abnormal; Notable for the following components:      Result Value   Sodium 116 (*)    Chloride 83 (*)    Glucose, Bld 136 (*)    All other components within normal limits  CBC WITH DIFFERENTIAL/PLATELET - Abnormal; Notable for the following components:   HCT 34.2 (*)    Lymphs Abs 0.5 (*)    All other components within normal limits  SODIUM, URINE, RANDOM  URINALYSIS, ROUTINE W REFLEX MICROSCOPIC  BASIC METABOLIC PANEL    EKG EKG Interpretation  Date/Time:  Tuesday November 01 2017 18:11:02 EDT Ventricular Rate:  78 PR Interval:    QRS Duration: 106 QT Interval:  396 QTC Calculation: 452 R Axis:   -17 Text  Interpretation:  Sinus rhythm Borderline left axis deviation Low voltage, precordial leads no significant change since Feb 2019 Confirmed by Sherwood Gambler [phone]) on 11/01/2017 6:21:38 PM   Radiology Dg Chest 2 View  Result Date: 11/01/2017 CLINICAL DATA:  Episodes of coughing and wheezing over the last 4 months EXAM: CHEST - 2 VIEW COMPARISON:  Chest x-ray of 06/22/2017 FINDINGS: No pneumonia or effusion is seen. There is mild peribronchial thickening which could indicate bronchitis. Mediastinal and hilar contours are unremarkable. The heart is borderline enlarged and stable. There are degenerative changes in the mid to lower thoracic spine. IMPRESSION: 1. No active lung disease. 2. Cannot exclude bronchitis. Electronically Signed   By: Ivar Drape M.D.   On: 11/01/2017 14:18    Procedures Procedures (including critical care time)  Medications Ordered in ED Medications  sodium chloride tablet 2 g (has no administration in time range)  amLODipine (NORVASC) tablet 5 mg (has no administration in time range)  atorvastatin (LIPITOR) tablet 20 mg (has no administration in time range)  clopidogrel (PLAVIX) tablet 75 mg (has no administration in time range)  pantoprazole (PROTONIX)  EC tablet 40 mg (has no administration in time range)  rOPINIRole (REQUIP) tablet 1 mg (has no administration in time range)  metoprolol succinate (TOPROL-XL) 24 hr tablet 50 mg (has no administration in time range)  acetaminophen (TYLENOL) tablet 650 mg (has no administration in time range)    Or  acetaminophen (TYLENOL) suppository 650 mg (has no administration in time range)  ondansetron (ZOFRAN) tablet 4 mg (has no administration in time range)    Or  ondansetron (ZOFRAN) injection 4 mg (has no administration in time range)  enoxaparin (LOVENOX) injection 40 mg (has no administration in time range)     Initial Impression / Assessment and Plan / ED Course  I have reviewed the triage vital signs and the nursing notes.  Pertinent labs & imaging results that were available during my care of the patient were reviewed by me and considered in my medical decision making (see chart for details).     Patient presents with hyponatremia.  She has been having some on and off hallucinations but on my exam is well-appearing and mental status is normal.  Unclear exact etiology.  For now will fluid restrict.  Vital signs unremarkable.  Discussed with hospitalist, Dr. Alcario Drought to admit.  Final Clinical Impressions(s) / ED Diagnoses   Final diagnoses:  Hyponatremia    ED Discharge Orders    None       Sherwood Gambler, MD 11/01/17 2116

## 2017-11-01 NOTE — Progress Notes (Signed)
RT protocol assessment done.  Pt seen on room air, breath sounds clear, no respiratory distress noted or voiced by pt at this time.  Nebulizer treatments not indicated at this time.

## 2017-11-01 NOTE — ED Triage Notes (Signed)
patient has a Na level-113.

## 2017-11-01 NOTE — ED Notes (Signed)
Bed: LN98 Expected date:  Expected time:  Means of arrival:  Comments: Triage 2

## 2017-11-01 NOTE — ED Notes (Signed)
Date and time results received: 11/01/17 7:13 PM  (use smartphrase ".now" to insert current time)  Test: Na Critical Value: 116  Name of Provider Notified: Dr. Regenia Skeeter  Orders Received? Or Actions Taken?: MD notified

## 2017-11-01 NOTE — ED Notes (Signed)
ED TO INPATIENT HANDOFF REPORT  Name/Age/Gender Cassidy Ellison 84 y.o. female  Code Status    Code Status Orders  (From admission, onward)        Start     Ordered   11/01/17 2109  Do not attempt resuscitation (DNR)  Continuous    Question Answer Comment  In the event of cardiac or respiratory ARREST Do not call a "code blue"   In the event of cardiac or respiratory ARREST Do not perform Intubation, CPR, defibrillation or ACLS   In the event of cardiac or respiratory ARREST Use medication by any route, position, wound care, and other measures to relive pain and suffering. May use oxygen, suction and manual treatment of airway obstruction as needed for comfort.      11/01/17 2111    Code Status History    Date Active Date Inactive Code Status Order ID Comments User Context   06/13/2015 2310 06/14/2015 1751 DNR [id number]  Toy Baker, MD Inpatient   05/15/2015 2010 05/24/2015 1526 Full Code [id number]  Ivor Costa, MD ED   05/11/2015 2035 05/13/2015 1755 Full Code [id number]  Reubin Milan, MD Inpatient    Advance Directive Documentation     Most Recent Value  Type of Advance Directive  Healthcare Power of Attorney, Living will  Pre-existing out of facility DNR order (yellow form or pink MOST form)  -  "MOST" Form in Place?  -      Home/SNF/Other Home  Chief Complaint PCP sent over for confusion, altered mental staus, sodium level low  Level of Care/Admitting Diagnosis ED Disposition    ED Disposition Condition [place]: [facility] [[id number]]  Level of Care: Med-Surg [16]  Diagnosis: Hyponatremia [622633]  Admitting Physician: Etta Quill [[id number]]  Attending Physician: Etta Quill [[id number]]  Estimated length of stay: past midnight tomorrow  Certification:: I certify this patient will need inpatient services for at least 2 midnights  PT Class (Do Not Modify): Inpatient [101]  PT Acc Code (Do Not Modify): Private [1]        Medical History Past Medical History:  Diagnosis Date  . Carpal tunnel syndrome    left  . DVT (deep venous thrombosis) ([place])   . GERD (gastroesophageal reflux disease)   . Hyperlipemia   . Hypertension   . RLS (restless legs syndrome)     Allergies Allergies  Allergen Reactions  . Adhesive [Tape] Itching  . Chlorthalidone Other (See Comments)    creatinine  increases  . Hydrochlorothiazide Other (See Comments)    CREATININE INCREASES  . Oxsoralen [Methoxsalen]     IV Location/Drains/Wounds Patient Lines/Drains/Airways Status   Active Line/Drains/Airways    Name:   Placement date:   Placement time:   Site:   Days:   Peripheral IV 11/01/17 Left Forearm   11/01/17    1917    Forearm   less than 1   Incision (Closed) 02/12/16 Arm Right   02/12/16    1524     628          Labs/Imaging Results for orders placed or performed during the hospital encounter of 11/01/17 (from the past 48 hour(s))  Comprehensive metabolic panel     Status: Abnormal   Collection Time: 11/01/17  6:28 PM  Result Value Ref Range   Sodium 116 (LL) 135 - 145 mmol/L    Comment: CRITICAL RESULT CALLED TO, READ BACK BY AND VERIFIED WITH: Marjie Skiff. RN '@1911'$  ON  06.25.19 BY COHEN,K    Potassium 3.9 3.5 - 5.1 mmol/L   Chloride 83 (L) 98 - 111 mmol/L    Comment: Please note change in reference range.   CO2 23 22 - 32 mmol/L   Glucose, Bld 136 (H) 70 - 99 mg/dL    Comment: Please note change in reference range.   BUN 12 8 - 23 mg/dL    Comment: Please note change in reference range.   Creatinine, Ser 0.53 0.44 - 1.00 mg/dL   Calcium 9.1 8.9 - 10.3 mg/dL   Total Protein 7.1 6.5 - 8.1 g/dL   Albumin 3.7 3.5 - 5.0 g/dL   AST 34 15 - 41 U/L   ALT 23 0 - 44 U/L    Comment: Please note change in reference range.   Alkaline Phosphatase 76 38 - 126 U/L   Total Bilirubin 0.9 0.3 - 1.2 mg/dL   GFR calc non Af Amer >60 >60 mL/min   GFR calc Af Amer >60 >60 mL/min    Comment: (NOTE) The eGFR has been  calculated using the CKD EPI equation. This calculation has not been validated in all clinical situations. eGFR's persistently <60 mL/min signify possible Chronic Kidney Disease.    Anion gap 10 5 - 15    Comment: Performed at [facility], [place] [address], [place], [place] [address]  CBC with Differential     Status: Abnormal   Collection Time: 11/01/17  6:28 PM  Result Value Ref Range   WBC 6.5 4.0 - 10.5 K/uL   RBC 4.34 3.87 - 5.11 MIL/uL   Hemoglobin 12.1 12.0 - 15.0 g/dL   HCT 34.2 (L) 36.0 - 46.0 %   MCV 78.8 78.0 - 100.0 fL   MCH 27.9 26.0 - 34.0 pg   MCHC 35.4 30.0 - 36.0 g/dL   RDW 12.1 11.5 - 15.5 %   Platelets 190 150 - 400 K/uL   Neutrophils Relative % 90 %   Neutro Abs 5.9 1.7 - 7.7 K/uL   Lymphocytes Relative 8 %   Lymphs Abs 0.5 (L) 0.7 - 4.0 K/uL   Monocytes Relative 2 %   Monocytes Absolute 0.1 0.1 - 1.0 K/uL   Eosinophils Relative 0 %   Eosinophils Absolute 0.0 0.0 - 0.7 K/uL   Basophils Relative 0 %   Basophils Absolute 0.0 0.0 - 0.1 K/uL    Comment: Performed at [facility], [place] [address], [place], [place] [address]  Sodium, urine, random     Status: None   Collection Time: 11/01/17  6:53 PM  Result Value Ref Range   Sodium, Ur 23 mmol/L    Comment: Performed at [facility], [place] [address], [place], [place] [address]  Urinalysis, Routine w reflex microscopic     Status: None   Collection Time: 11/01/17  6:53 PM  Result Value Ref Range   Color, Urine YELLOW YELLOW   APPearance CLEAR CLEAR   Specific Gravity, Urine 1.005 1.005 - 1.030   pH 7.0 5.0 - 8.0   Glucose, UA NEGATIVE NEGATIVE mg/dL   Hgb urine dipstick NEGATIVE NEGATIVE   Bilirubin Urine NEGATIVE NEGATIVE   Ketones, ur NEGATIVE NEGATIVE mg/dL   Protein, ur NEGATIVE NEGATIVE mg/dL   Nitrite NEGATIVE NEGATIVE   Leukocytes, UA NEGATIVE NEGATIVE    Comment: Performed at [facility] [address] [place],  [place] [address]   Dg Chest 2 View  Result Date: 11/01/2017 CLINICAL DATA:  Episodes  of coughing and wheezing over the last 4 months EXAM: CHEST - 2 VIEW COMPARISON:  Chest x-ray of 06/22/2017 FINDINGS: No pneumonia or effusion is seen. There is mild peribronchial thickening which could indicate bronchitis. Mediastinal and hilar contours are unremarkable. The heart is borderline enlarged and stable. There are degenerative changes in the mid to lower thoracic spine. IMPRESSION: 1. No active lung disease. 2. Cannot exclude bronchitis. Electronically Signed   By: Ivar Drape M.D.   On: 11/01/2017 14:18    Pending Labs Unresulted Labs (From admission, onward)   Start     Ordered   11/02/17 8088  Basic metabolic panel  Tomorrow morning,   R     11/01/17 2107      Vitals/Pain Today's Vitals   11/01/17 1900 11/01/17 1930 11/01/17 2009 11/01/17 2030  BP: 107/75 132/67 135/61 134/65  Pulse: 70 76 78 73  Resp: 15 (!) '21 19 15  '$ Temp:      TempSrc:      SpO2: 95% 92% 97% 94%  Weight:      Height:      PainSc:        Isolation Precautions No active isolations  Medications Medications  sodium chloride tablet 2 g (has no administration in time range)  amLODipine (NORVASC) tablet 5 mg (has no administration in time range)  atorvastatin (LIPITOR) tablet 20 mg (has no administration in time range)  clopidogrel (PLAVIX) tablet 75 mg (has no administration in time range)  pantoprazole (PROTONIX) EC tablet 40 mg (has no administration in time range)  rOPINIRole (REQUIP) tablet 1 mg (has no administration in time range)  metoprolol succinate (TOPROL-XL) 24 hr tablet 50 mg (has no administration in time range)  acetaminophen (TYLENOL) tablet 650 mg (has no administration in time range)    Or  acetaminophen (TYLENOL) suppository 650 mg (has no administration in time range)  ondansetron (ZOFRAN) tablet 4 mg (has no administration in time range)    Or  ondansetron (ZOFRAN) injection 4 mg (has no  administration in time range)  enoxaparin (LOVENOX) injection 40 mg (has no administration in time range)  predniSONE (DELTASONE) tablet 40 mg (has no administration in time range)    Mobility walks with device and person assist

## 2017-11-01 NOTE — H&P (Signed)
History and Physical    Cassidy Ellison YSA:[id number] DOB: 1921/10/11 DOA: 11/01/2017  PCP: Harlan Stains, MD  Patient coming from: Home  I have personally briefly reviewed patient's old medical records in [place]  Chief Complaint: Hyponatremia  HPI: Cassidy Ellison is a 84 y.o. female with medical history significant of DVT, HTN, HLD.  Hyponatremia in Jan 2017 ultimately felt to be due to mild SIADH, treated with fluid restriction / salt tabs first then ultimately tolvaptan (see nephrology consult notes from the 05/15/2015 admit).  Patient seen at PCP office for checkup on 6/21.  Labs obtained and sodium 128.  Patient with intermittent confusion and mild wheezing over weekend which was attributed to dextromethorphan.  Back to PCPs office today, sodium was 113.  Sent in to ED.   ED Course: Sodium 116.  Normal mental status.   Review of Systems: As per HPI otherwise 10 point review of systems negative.   Past Medical History:  Diagnosis Date  . Carpal tunnel syndrome    left  . DVT (deep venous thrombosis) ([place])   . GERD (gastroesophageal reflux disease)   . Hyperlipemia   . Hypertension   . RLS (restless legs syndrome)     Past Surgical History:  Procedure Laterality Date  . ABDOMINAL HYSTERECTOMY    . CARPAL TUNNEL RELEASE Left 10/31/2014   Procedure: LEFT CARPAL TUNNEL RELEASE;  Surgeon: Leanora Cover, MD;  Location: [facility];  Service: Orthopedics;  Laterality: Left;  . CARPAL TUNNEL RELEASE Right 02/12/2016   Procedure: CARPAL TUNNEL RELEASE, right;  Surgeon: Leanora Cover, MD;  Location: [facility];  Service: Orthopedics;  Laterality: Right;  . GALLBLADDER SURGERY       reports that she has quit smoking. She has never used smokeless tobacco. She reports that she drinks alcohol. She reports that she does not use drugs.  Allergies  Allergen Reactions  . Adhesive [Tape] Itching  . Chlorthalidone Other (See Comments)    creatinine   increases  . Hydrochlorothiazide Other (See Comments)    CREATININE INCREASES  . Oxsoralen [Methoxsalen]     Family History  Problem Relation Age of Onset  . Heart failure Father   . Heart failure Mother   . Cancer Sister        breast cancer  . Diabetes Brother      Prior to Admission medications   Medication Sig Start Date End Date Taking? Authorizing Provider  amLODipine (NORVASC) 5 MG tablet Take 5 mg by mouth at bedtime.  08/19/17  Yes [provider]  atorvastatin (LIPITOR) 20 MG tablet Take 20 mg by mouth daily.  07/28/17  Yes [provider]  Calcium Carbonate (CALCIUM 600 PO) Take 600 mg by mouth daily.   Yes [provider]  Cholecalciferol (VITAMIN D-3) 1000 units CAPS Take 1,000 Units by mouth daily.   Yes [provider]  clopidogrel (PLAVIX) 75 MG tablet Take 1 tablet (75 mg total) by mouth daily. 09/05/17  Yes Rosalin Hawking, MD  losartan (COZAAR) 100 MG tablet Take 100 mg by mouth daily.   Yes [provider]  metoprolol succinate (TOPROL-XL) 50 MG 24 hr tablet Take 1 tablet (50 mg total) by mouth daily. 08/12/15  Yes Rosalin Hawking, MD  Multiple Vitamins-Minerals (MULTIVITAMIN WITH MINERALS) tablet Take 1 tablet by mouth daily.    Yes [provider]  pantoprazole (PROTONIX) 40 MG tablet Take 1 tablet (40 mg total) by mouth daily. 09/05/17  Yes Xu,  Jindong, MD  rOPINIRole (REQUIP) 1 MG tablet Take 1 mg by mouth at bedtime.   Yes [provider]  vitamin C (ASCORBIC ACID) 500 MG tablet Take 500 mg by mouth daily.   Yes [provider]    Physical Exam: Vitals:   11/01/17 1900 11/01/17 1930 11/01/17 2009 11/01/17 2030  BP: 107/75 132/67 135/61 134/65  Pulse: 70 76 78 73  Resp: 15 (!) 21 19 15   Temp:      TempSrc:      SpO2: 95% 92% 97% 94%  Weight:      Height:        Constitutional: NAD, calm, comfortable Eyes: PERRL, lids and conjunctivae normal ENMT: Mucous membranes are moist. Posterior  pharynx clear of any exudate or lesions.Normal dentition.  Neck: normal, supple, no masses, no thyromegaly Respiratory: clear to auscultation bilaterally, no wheezing, no crackles. Normal respiratory effort. No accessory muscle use.  Cardiovascular: Regular rate and rhythm, no murmurs / rubs / gallops. No extremity edema. 2+ pedal pulses. No carotid bruits.  Abdomen: no tenderness, no masses palpated. No hepatosplenomegaly. Bowel sounds positive.  Musculoskeletal: no clubbing / cyanosis. No joint deformity upper and lower extremities. Good ROM, no contractures. Normal muscle tone.  Skin: no rashes, lesions, ulcers. No induration Neurologic: CN 2-12 grossly intact. Sensation intact, DTR normal. Strength 5/5 in all 4.  Psychiatric: Normal judgment and insight. Alert and oriented x 3. Normal mood.    Labs on Admission: I have personally reviewed following labs and imaging studies  CBC: Recent Labs  Lab 11/01/17 1828  WBC 6.5  NEUTROABS 5.9  HGB 12.1  HCT 34.2*  MCV 78.8  PLT 431   Basic Metabolic Panel: Recent Labs  Lab 11/01/17 1828  NA 116*  K 3.9  CL 83*  CO2 23  GLUCOSE 136*  BUN 12  CREATININE 0.53  CALCIUM 9.1   GFR: Estimated Creatinine Clearance: 39.5 mL/min (by C-G formula based on SCr of 0.53 mg/dL). Liver Function Tests: Recent Labs  Lab 11/01/17 1828  AST 34  ALT 23  ALKPHOS 76  BILITOT 0.9  PROT 7.1  ALBUMIN 3.7   No results for input(s): LIPASE, AMYLASE in the last 168 hours. No results for input(s): AMMONIA in the last 168 hours. Coagulation Profile: No results for input(s): INR, PROTIME in the last 168 hours. Cardiac Enzymes: No results for input(s): CKTOTAL, CKMB, CKMBINDEX, TROPONINI in the last 168 hours. BNP (last 3 results) No results for input(s): PROBNP in the last 8760 hours. HbA1C: No results for input(s): HGBA1C in the last 72 hours. CBG: No results for input(s): GLUCAP in the last 168 hours. Lipid Profile: No results for input(s):  CHOL, HDL, LDLCALC, TRIG, CHOLHDL, LDLDIRECT in the last 72 hours. Thyroid Function Tests: No results for input(s): TSH, T4TOTAL, FREET4, T3FREE, THYROIDAB in the last 72 hours. Anemia Panel: No results for input(s): VITAMINB12, FOLATE, FERRITIN, TIBC, IRON, RETICCTPCT in the last 72 hours. Urine analysis:    Component Value Date/Time   COLORURINE YELLOW 11/01/2017 1853   APPEARANCEUR CLEAR 11/01/2017 1853   LABSPEC 1.005 11/01/2017 1853   PHURINE 7.0 11/01/2017 1853   GLUCOSEU NEGATIVE 11/01/2017 1853   HGBUR NEGATIVE 11/01/2017 1853   BILIRUBINUR NEGATIVE 11/01/2017 1853   KETONESUR NEGATIVE 11/01/2017 1853   PROTEINUR NEGATIVE 11/01/2017 1853   NITRITE NEGATIVE 11/01/2017 1853   LEUKOCYTESUR NEGATIVE 11/01/2017 1853    Radiological Exams on Admission: Dg Chest 2 View  Result Date: 11/01/2017 CLINICAL DATA:  Episodes of coughing  and wheezing over the last 4 months EXAM: CHEST - 2 VIEW COMPARISON:  Chest x-ray of 06/22/2017 FINDINGS: No pneumonia or effusion is seen. There is mild peribronchial thickening which could indicate bronchitis. Mediastinal and hilar contours are unremarkable. The heart is borderline enlarged and stable. There are degenerative changes in the mid to lower thoracic spine. IMPRESSION: 1. No active lung disease. 2. Cannot exclude bronchitis. Electronically Signed   By: Ivar Drape M.D.   On: 11/01/2017 14:18    EKG: Independently reviewed.  Assessment/Plan Principal Problem:   Hyponatremia Active Problems:   Essential hypertension   Acute confusional state    1. Hyponatremia - 1. Likely combo of Mild SIADH, increased FW intake and decreased solute intake given poor PO intake over weekend, see also admit in Jan 2017. 2. Will try Fluid restriction + salt tabs first 3. Consult Nephrology in AM 4. Note she did ultimately need tolvaptan in 2017. 5. Repeat BMP in AM 2. HTN - 1. Cont home BP meds 2. Except losartan given poor PO intake.  DVT prophylaxis:  Lovenox Code Status: DNR - per daughter Family Communication: Daughter at bedside Disposition Plan: Home after admit Consults called: None, call nephrology in AM Admission status: Admit to inpatient   Etta Quill DO [facility] Pager [phone] Only works nights!  If 7AM-7PM, please contact the primary day team physician taking care of patient  [email or web address] Password [facility]  11/01/2017, 9:13 PM

## 2017-11-01 NOTE — Progress Notes (Signed)
When asked if it is ok to ask the questions on the nursing admission history, pt asked "What?" Pt's daughter, [name] spoke up and said she has hcpoa and that it was ok and she answered the questions. Lucius Conn BSN, RN-BC Admissions RN 11/01/2017 9:25 PM

## 2017-11-02 DIAGNOSIS — E871 Hypo-osmolality and hyponatremia: Principal | ICD-10-CM

## 2017-11-02 LAB — BASIC METABOLIC PANEL
Anion gap: 9 (ref 5–15)
Anion gap: 10 (ref 5–15)
BUN: 13 mg/dL (ref 8–23)
BUN: 14 mg/dL (ref 8–23)
CHLORIDE: 86 mmol/L — AB (ref 98–111)
CHLORIDE: 86 mmol/L — AB (ref 98–111)
CO2: 25 mmol/L (ref 22–32)
CO2: 23 mmol/L (ref 22–32)
CREATININE: 0.68 mg/dL (ref 0.44–1.00)
CREATININE: 0.94 mg/dL (ref 0.44–1.00)
Calcium: 9.1 mg/dL (ref 8.9–10.3)
Calcium: 8.8 mg/dL — ABNORMAL LOW (ref 8.9–10.3)
GFR calc Af Amer: 59 mL/min — ABNORMAL LOW (ref >60)
GFR calc non Af Amer: >60 mL/min (ref >60)
GFR calc non Af Amer: 51 mL/min — ABNORMAL LOW (ref >60)
GLUCOSE: 163 mg/dL — AB (ref 70–99)
Glucose, Bld: 122 mg/dL — ABNORMAL HIGH (ref 70–99)
Potassium: 3.7 mmol/L (ref 3.5–5.1)
Potassium: 3.5 mmol/L (ref 3.5–5.1)
SODIUM: 120 mmol/L — AB (ref 135–145)
Sodium: 119 mmol/L — CL (ref 135–145)

## 2017-11-02 LAB — OSMOLALITY: OSMOLALITY: 248 mosm/kg — AB (ref 275–295)

## 2017-11-02 LAB — CHLORIDE, URINE, RANDOM: CHLORIDE URINE: 29 mmol/L

## 2017-11-02 LAB — NA AND K (SODIUM & POTASSIUM), RAND UR
Potassium Urine: 23 mmol/L
Sodium, Ur: 30 mmol/L

## 2017-11-02 LAB — OSMOLALITY, URINE: OSMOLALITY UR: 260 mosm/kg — AB (ref 300–900)

## 2017-11-02 LAB — CREATININE, URINE, RANDOM: CREATININE, URINE: 49.12 mg/dL

## 2017-11-02 LAB — MRSA PCR SCREENING: MRSA BY PCR: NEGATIVE

## 2017-11-02 MED ORDER — SODIUM CHLORIDE 0.9 % IV SOLN
INTRAVENOUS | Status: AC
  Administered 2017-11-02: 20:00:00 via INTRAVENOUS

## 2017-11-02 MED ORDER — IPRATROPIUM-ALBUTEROL 0.5-2.5 (3) MG/3ML IN SOLN
3.0000 mL | Freq: Two times a day (BID) | RESPIRATORY_TRACT | Status: DC
  Administered 2017-11-03: 3 mL via RESPIRATORY_TRACT
  Filled 2017-11-02 (×2): qty 3

## 2017-11-02 MED ORDER — IPRATROPIUM-ALBUTEROL 0.5-2.5 (3) MG/3ML IN SOLN
3.0000 mL | Freq: Four times a day (QID) | RESPIRATORY_TRACT | Status: DC
  Administered 2017-11-02: 3 mL via RESPIRATORY_TRACT
  Filled 2017-11-02: qty 3

## 2017-11-02 MED ORDER — POTASSIUM CHLORIDE CRYS ER 20 MEQ PO TBCR
40.0000 meq | EXTENDED_RELEASE_TABLET | Freq: Once | ORAL | Status: AC
  Administered 2017-11-02: 40 meq via ORAL
  Filled 2017-11-02: qty 2

## 2017-11-02 NOTE — Progress Notes (Signed)
CRITICAL VALUE ALERT  Critical Value:  248  Date & Time Notied:  11/02/17 1000  Provider Notified: Dr. Reesa Chew  Orders Received/Actions taken: no orders at this time

## 2017-11-02 NOTE — Progress Notes (Signed)
PROGRESS NOTE    Cassidy Ellison  SHF:[id number] DOB: Nov 30, 1921 DOA: 11/01/2017 PCP: Harlan Stains, MD   Brief Narrative:  84 year old female with history of DVT, hypertension, hyperlipidemia who was sent to the hospital from the PCPs office for evaluation of hyponatremia with sodium 113.   Assessment & Plan:   Principal Problem:   Hyponatremia Active Problems:   Essential hypertension  Acute metabolic encephalopathy, improved Hyponatremia - Admission sodium was 116.  Concerns for likely SIADH along with low solute intake -Currently she is placed on fluid restriction 1200cc and salt tablets - Provide supportive care.  Check BMP every 12 hours - If necessary will consult nephrology  Essential hypertension -Continue Norvasc 5 mg daily.  Losartan is on hold  Hyperlipidemia -Continue atorvastatin 20 mg daily  Abnormal Breath sounds, with history of Bronchitis -Nebulizer treatments ordered  DVT prophylaxis: Lovenox Code Status: Do not resuscitate Family Communication:  Daughter at bedside  Disposition Plan: TBD  Consultants:   None   Procedures:   None   Antimicrobials:   None    Subjective: No complaints, feels better. Mentation is at baseline.   Review of Systems Otherwise negative except as per HPI, including: General: Denies fever, chills, night sweats or unintended weight loss. Resp: Denies cough, wheezing, shortness of breath. Cardiac: Denies chest pain, palpitations, orthopnea, paroxysmal nocturnal dyspnea. GI: Denies abdominal pain, nausea, vomiting, diarrhea or constipation GU: Denies dysuria, frequency, hesitancy or incontinence MS: Denies muscle aches, joint pain or swelling Neuro: Denies headache, neurologic deficits (focal weakness, numbness, tingling), abnormal gait Psych: Denies anxiety, depression, SI/HI/AVH Skin: Denies new rashes or lesions ID: Denies sick contacts, exotic exposures, travel  Objective: Vitals:   11/01/17 2125 11/01/17  2215 11/02/17 0459 11/02/17 1317  BP: (!) 134/93 131/64 (!) 142/72 111/61  Pulse: 78 78 77 67  Resp: (!) 26 18 20 18   Temp:  98.6 F (37 C) 99 F (37.2 C) (!) 97.5 F (36.4 C)  TempSrc:    Oral  SpO2: 93% 96% 94% 96%  Weight:      Height:        Intake/Output Summary (Last 24 hours) at 11/02/2017 1345 Last data filed at 11/02/2017 1300 Gross per 24 hour  Intake 270 ml  Output -  Net 270 ml   Filed Weights   11/01/17 1756  Weight: 65.8 kg (145 lb)    Examination:  General exam: Appears calm and comfortable ; elderly frail appearing  Respiratory system: diffuse coarse BS Cardiovascular system: S1 & S2 heard, RRR. No JVD, murmurs, rubs, gallops or clicks. No pedal edema. Gastrointestinal system: Abdomen is nondistended, soft and nontender. No organomegaly or masses felt. Normal bowel sounds heard. Central nervous system: Alert and oriented. No focal neurological deficits. Extremities: Symmetric 5 x 5 power. Skin: No rashes, lesions or ulcers Psychiatry: Judgement and insight appear normal. Mood & affect appropriate.     Data Reviewed:   CBC: Recent Labs  Lab 11/01/17 1828  WBC 6.5  NEUTROABS 5.9  HGB 12.1  HCT 34.2*  MCV 78.8  PLT 502   Basic Metabolic Panel: Recent Labs  Lab 11/01/17 1828 11/02/17 0446  NA 116* 120*  K 3.9 3.7  CL 83* 86*  CO2 23 25  GLUCOSE 136* 122*  BUN 12 13  CREATININE 0.53 0.68  CALCIUM 9.1 9.1   GFR: Estimated Creatinine Clearance: 39.5 mL/min (by C-G formula based on SCr of 0.68 mg/dL). Liver Function Tests: Recent Labs  Lab 11/01/17 1828  AST 34  ALT 23  ALKPHOS 76  BILITOT 0.9  PROT 7.1  ALBUMIN 3.7   No results for input(s): LIPASE, AMYLASE in the last 168 hours. No results for input(s): AMMONIA in the last 168 hours. Coagulation Profile: No results for input(s): INR, PROTIME in the last 168 hours. Cardiac Enzymes: No results for input(s): CKTOTAL, CKMB, CKMBINDEX, TROPONINI in the last 168 hours. BNP (last 3  results) No results for input(s): PROBNP in the last 8760 hours. HbA1C: No results for input(s): HGBA1C in the last 72 hours. CBG: No results for input(s): GLUCAP in the last 168 hours. Lipid Profile: No results for input(s): CHOL, HDL, LDLCALC, TRIG, CHOLHDL, LDLDIRECT in the last 72 hours. Thyroid Function Tests: No results for input(s): TSH, T4TOTAL, FREET4, T3FREE, THYROIDAB in the last 72 hours. Anemia Panel: No results for input(s): VITAMINB12, FOLATE, FERRITIN, TIBC, IRON, RETICCTPCT in the last 72 hours. Sepsis Labs: No results for input(s): PROCALCITON, LATICACIDVEN in the last 168 hours.  Recent Results (from the past 240 hour(s))  MRSA PCR Screening     Status: None   Collection Time: 11/02/17  6:52 AM  Result Value Ref Range Status   MRSA by PCR NEGATIVE NEGATIVE Final    Comment:        The GeneXpert MRSA Assay (FDA approved for NASAL specimens only), is one component of a comprehensive MRSA colonization surveillance program. It is not intended to diagnose MRSA infection nor to guide or monitor treatment for MRSA infections. Performed at [facility], [place] [address] [place], [place] [address]          Radiology Studies: Dg Chest 2 View  Result Date: 11/01/2017 CLINICAL DATA:  Episodes of coughing and wheezing over the last 4 months EXAM: CHEST - 2 VIEW COMPARISON:  Chest x-ray of 06/22/2017 FINDINGS: No pneumonia or effusion is seen. There is mild peribronchial thickening which could indicate bronchitis. Mediastinal and hilar contours are unremarkable. The heart is borderline enlarged and stable. There are degenerative changes in the mid to lower thoracic spine. IMPRESSION: 1. No active lung disease. 2. Cannot exclude bronchitis. Electronically Signed   By: Ivar Drape M.D.   On: 11/01/2017 14:18        Scheduled Meds: . amLODipine  5 mg Oral QHS  . atorvastatin  20 mg Oral Daily  . clopidogrel  75 mg Oral Daily  . enoxaparin  (LOVENOX) injection  40 mg Subcutaneous QHS  . metoprolol succinate  50 mg Oral Daily  . pantoprazole  40 mg Oral Daily  . predniSONE  40 mg Oral Q breakfast  . rOPINIRole  1 mg Oral QHS  . sodium chloride  2 g Oral BID WC   Continuous Infusions:   LOS: 1 day    I have spent 35 minutes face to face with the patient and on the ward discussing the patients care, assessment, plan and disposition with other care givers. >50% of the time was devoted counseling the patient about the risks and benefits of treatment and coordinating care.     Bobbyjoe Pabst Arsenio Loader, MD [facility] Pager [phone]   If 7PM-7AM, please contact night-coverage [email or web address] Password [email or web address] 11/02/2017, 1:45 PM

## 2017-11-03 ENCOUNTER — Inpatient Hospital Stay (HOSPITAL_COMMUNITY): Payer: Medicare Other

## 2017-11-03 LAB — BASIC METABOLIC PANEL
ANION GAP: 6 (ref 5–15)
Anion gap: 8 (ref 5–15)
BUN: 13 mg/dL (ref 8–23)
BUN: 13 mg/dL (ref 8–23)
CALCIUM: 8.9 mg/dL (ref 8.9–10.3)
CO2: 24 mmol/L (ref 22–32)
CO2: 26 mmol/L (ref 22–32)
CREATININE: 0.85 mg/dL (ref 0.44–1.00)
Calcium: 9.1 mg/dL (ref 8.9–10.3)
Chloride: 94 mmol/L — ABNORMAL LOW (ref 98–111)
Chloride: 94 mmol/L — ABNORMAL LOW (ref 98–111)
Creatinine, Ser: 0.72 mg/dL (ref 0.44–1.00)
GFR calc non Af Amer: 57 mL/min — ABNORMAL LOW (ref >60)
GLUCOSE: 102 mg/dL — AB (ref 70–99)
Glucose, Bld: 169 mg/dL — ABNORMAL HIGH (ref 70–99)
POTASSIUM: 3.5 mmol/L (ref 3.5–5.1)
Potassium: 4.4 mmol/L (ref 3.5–5.1)
SODIUM: 126 mmol/L — AB (ref 135–145)
Sodium: 126 mmol/L — ABNORMAL LOW (ref 135–145)

## 2017-11-03 LAB — BRAIN NATRIURETIC PEPTIDE: B Natriuretic Peptide: 185.1 pg/mL — ABNORMAL HIGH (ref 0.0–100.0)

## 2017-11-03 MED ORDER — IPRATROPIUM-ALBUTEROL 0.5-2.5 (3) MG/3ML IN SOLN: 3.0000 mL | RESPIRATORY_TRACT | Status: DC | PRN

## 2017-11-03 MED ORDER — SODIUM CHLORIDE 0.9 % IV SOLN
INTRAVENOUS | Status: AC
  Administered 2017-11-03 – 2017-11-04 (×2): via INTRAVENOUS

## 2017-11-03 MED ORDER — POTASSIUM CHLORIDE CRYS ER 20 MEQ PO TBCR
40.0000 meq | EXTENDED_RELEASE_TABLET | Freq: Once | ORAL | Status: AC
  Administered 2017-11-03: 40 meq via ORAL
  Filled 2017-11-03: qty 2

## 2017-11-03 NOTE — Progress Notes (Signed)
PROGRESS NOTE    Cassidy Ellison  VVO:[id number] DOB: Oct 16, 1921 DOA: 11/01/2017 PCP: Harlan Stains, MD   Brief Narrative:  84 year old female with history of DVT, hypertension, hyperlipidemia who was sent to the hospital from the PCPs office for evaluation of hyponatremia with sodium 113.  Initially she was placed on fluid restriction and started on salt tablets.  Her sodium improved at first but then started downtrending.  Upon further evaluation it appears she could also been dehydrated therefore started on normal saline which improved her sodium 226.   Assessment & Plan:   Principal Problem:   Hyponatremia Active Problems:   Essential hypertension  Acute metabolic encephalopathy, resolved Hyponatremia, improving - It now appears that this could be secondary to dehydration.  Yesterday yesterday evening she was started on normal saline 75 cc/h and this morning rate was reduced to 50 cc/h for another 24 hours.  We will periodically check her BMP and once her sodium is over 130 and remains that way we will consider discharging her.  Essential hypertension -Continue Norvasc 5 mg daily.  Losartan is on hold  Hyperlipidemia -Continue atorvastatin 20 mg daily  Abnormal Breath sounds, with history of Bronchitis; improved  -Nebulizer treatments as needed   DVT prophylaxis: Lovenox Code Status: Do not resuscitate Family Communication:  None at bedside  Disposition Plan: If Na remais >130; will discharge her in next 24 hrs.   Consultants:   None   Procedures:   None   Antimicrobials:   None    Subjective: No complaints, sitting up at the side of the bed on the chair.   Review of Systems Otherwise negative except as per HPI, including: General = no fevers, chills, dizziness, malaise, fatigue HEENT/EYES = negative for pain, redness, loss of vision, double vision, blurred vision, loss of hearing, sore throat, hoarseness, dysphagia Cardiovascular= negative for chest pain,  palpitation, murmurs, lower extremity swelling Respiratory/lungs= negative for shortness of breath, cough, hemoptysis, wheezing, mucus production Gastrointestinal= negative for nausea, vomiting,, abdominal pain, melena, hematemesis Genitourinary= negative for Dysuria, Hematuria, Change in Urinary Frequency MSK = Negative for arthralgia, myalgias, Back Pain, Joint swelling  Neurology= Negative for headache, seizures, numbness, tingling  Psychiatry= Negative for anxiety, depression, suicidal and homocidal ideation Allergy/Immunology= Medication/Food allergy as listed  Skin= Negative for Rash, lesions, ulcers, itching   Objective: Vitals:   11/02/17 2033 11/03/17 0508 11/03/17 0853 11/03/17 0907  BP: (!) 153/79 (!) 151/63 (!) 106/43   Pulse: 81 66 66   Resp: 20 18    Temp: 97.7 F (36.5 C) 97.8 F (36.6 C)    TempSrc: Oral Oral    SpO2: 97% 95%  97%  Weight:      Height:        Intake/Output Summary (Last 24 hours) at 11/03/2017 1034 Last data filed at 11/03/2017 1000 Gross per 24 hour  Intake 1153.75 ml  Output 750 ml  Net 403.75 ml   Filed Weights   11/01/17 1756  Weight: 65.8 kg (145 lb)    Examination: Constitutional: NAD, calm, comfortable; elderly frail appearing.  Eyes: PERRL, lids and conjunctivae normal ENMT: Mucous membranes are moist. Posterior pharynx clear of any exudate or lesions.Normal dentition.  Neck: normal, supple, no masses, no thyromegaly Respiratory: mild coarse BS- improved.  Cardiovascular: Regular rate and rhythm, no murmurs / rubs / gallops. No extremity edema. 2+ pedal pulses. No carotid bruits.  Abdomen: no tenderness, no masses palpated. No hepatosplenomegaly. Bowel sounds positive.  Musculoskeletal: no clubbing / cyanosis. No joint  deformity upper and lower extremities. Good ROM, no contractures. Normal muscle tone.  Skin: no rashes, lesions, ulcers. No induration Neurologic: CN 2-12 grossly intact. Sensation intact, DTR normal. Strength 5/5  in all 4.  Psychiatric: Normal judgment and insight. Alert and oriented x 3. Normal mood.   Data Reviewed:   CBC: Recent Labs  Lab 11/01/17 1828  WBC 6.5  NEUTROABS 5.9  HGB 12.1  HCT 34.2*  MCV 78.8  PLT 863   Basic Metabolic Panel: Recent Labs  Lab 11/01/17 1828 11/02/17 0446 11/02/17 1605 11/03/17 0449  NA 116* 120* 119* 126*  K 3.9 3.7 3.5 3.5  CL 83* 86* 86* 94*  CO2 23 25 23 24   GLUCOSE 136* 122* 163* 102*  BUN 12 13 14 13   CREATININE 0.53 0.68 0.94 0.72  CALCIUM 9.1 9.1 8.8* 9.1   GFR: Estimated Creatinine Clearance: 39.5 mL/min (by C-G formula based on SCr of 0.72 mg/dL). Liver Function Tests: Recent Labs  Lab 11/01/17 1828  AST 34  ALT 23  ALKPHOS 76  BILITOT 0.9  PROT 7.1  ALBUMIN 3.7   No results for input(s): LIPASE, AMYLASE in the last 168 hours. No results for input(s): AMMONIA in the last 168 hours. Coagulation Profile: No results for input(s): INR, PROTIME in the last 168 hours. Cardiac Enzymes: No results for input(s): CKTOTAL, CKMB, CKMBINDEX, TROPONINI in the last 168 hours. BNP (last 3 results) No results for input(s): PROBNP in the last 8760 hours. HbA1C: No results for input(s): HGBA1C in the last 72 hours. CBG: No results for input(s): GLUCAP in the last 168 hours. Lipid Profile: No results for input(s): CHOL, HDL, LDLCALC, TRIG, CHOLHDL, LDLDIRECT in the last 72 hours. Thyroid Function Tests: No results for input(s): TSH, T4TOTAL, FREET4, T3FREE, THYROIDAB in the last 72 hours. Anemia Panel: No results for input(s): VITAMINB12, FOLATE, FERRITIN, TIBC, IRON, RETICCTPCT in the last 72 hours. Sepsis Labs: No results for input(s): PROCALCITON, LATICACIDVEN in the last 168 hours.  Recent Results (from the past 240 hour(s))  MRSA PCR Screening     Status: None   Collection Time: 11/02/17  6:52 AM  Result Value Ref Range Status   MRSA by PCR NEGATIVE NEGATIVE Final    Comment:        The GeneXpert MRSA Assay (FDA approved for  NASAL specimens only), is one component of a comprehensive MRSA colonization surveillance program. It is not intended to diagnose MRSA infection nor to guide or monitor treatment for MRSA infections. Performed at [facility], [place] [address] [place], [place] [address]          Radiology Studies: Dg Chest 2 View  Result Date: 11/01/2017 CLINICAL DATA:  Episodes of coughing and wheezing over the last 4 months EXAM: CHEST - 2 VIEW COMPARISON:  Chest x-ray of 06/22/2017 FINDINGS: No pneumonia or effusion is seen. There is mild peribronchial thickening which could indicate bronchitis. Mediastinal and hilar contours are unremarkable. The heart is borderline enlarged and stable. There are degenerative changes in the mid to lower thoracic spine. IMPRESSION: 1. No active lung disease. 2. Cannot exclude bronchitis. Electronically Signed   By: Ivar Drape M.D.   On: 11/01/2017 14:18   Dg Chest Port 1 View  Result Date: 11/03/2017 CLINICAL DATA:  Dyspnea EXAM: PORTABLE CHEST 1 VIEW COMPARISON:  11/01/2017 FINDINGS: Cardiac shadows within normal limits. The lungs are well aerated bilaterally. Aortic calcifications are again seen. No acute bony abnormality is seen. IMPRESSION: No active disease. Electronically Signed  By: Inez Catalina M.D.   On: 11/03/2017 08:17        Scheduled Meds: . amLODipine  5 mg Oral QHS  . atorvastatin  20 mg Oral Daily  . clopidogrel  75 mg Oral Daily  . enoxaparin (LOVENOX) injection  40 mg Subcutaneous QHS  . metoprolol succinate  50 mg Oral Daily  . pantoprazole  40 mg Oral Daily  . predniSONE  40 mg Oral Q breakfast  . rOPINIRole  1 mg Oral QHS  . sodium chloride  2 g Oral BID WC   Continuous Infusions: . sodium chloride 50 mL/hr at 11/03/17 0900     LOS: 2 days    I have spent 30 minutes face to face with the patient and on the ward discussing the patients care, assessment, plan and disposition with other care givers. >50% of the  time was devoted counseling the patient about the risks and benefits of treatment and coordinating care.     Jayveion Stalling Arsenio Loader, MD [facility] Pager [phone]   If 7PM-7AM, please contact night-coverage [email or web address] Password [facility] 11/03/2017, 10:34 AM

## 2017-11-04 LAB — BASIC METABOLIC PANEL
Anion gap: 8 (ref 5–15)
Anion gap: 7 (ref 5–15)
BUN: 15 mg/dL (ref 8–23)
BUN: 18 mg/dL (ref 8–23)
CALCIUM: 9 mg/dL (ref 8.9–10.3)
CO2: 23 mmol/L (ref 22–32)
CO2: 24 mmol/L (ref 22–32)
CREATININE: 0.65 mg/dL (ref 0.44–1.00)
CREATININE: 0.9 mg/dL (ref 0.44–1.00)
Calcium: 8.9 mg/dL (ref 8.9–10.3)
Chloride: 98 mmol/L (ref 98–111)
Chloride: 99 mmol/L (ref 98–111)
GFR calc Af Amer: >60 mL/min (ref >60)
GFR calc Af Amer: >60 mL/min (ref >60)
GFR, EST NON AFRICAN AMERICAN: 53 mL/min — AB (ref >60)
GLUCOSE: 144 mg/dL — AB (ref 70–99)
Glucose, Bld: 108 mg/dL — ABNORMAL HIGH (ref 70–99)
Potassium: 4.3 mmol/L (ref 3.5–5.1)
Potassium: 4.2 mmol/L (ref 3.5–5.1)
SODIUM: 129 mmol/L — AB (ref 135–145)
SODIUM: 130 mmol/L — AB (ref 135–145)

## 2017-11-04 MED ORDER — SODIUM CHLORIDE 0.9 % IV SOLN
INTRAVENOUS | Status: AC
  Administered 2017-11-04: 20:00:00 via INTRAVENOUS
  Administered 2017-11-04: 50 mL/h via INTRAVENOUS

## 2017-11-04 NOTE — Progress Notes (Signed)
Report received from D.Green,RN. No change in assessment. Continue plan of care. Stacey Drain

## 2017-11-04 NOTE — Care Management Note (Signed)
Case Management Note  Patient Details  Name: Cassidy Ellison MRN: [id number] Date of Birth: [birth year]  Subjective/Objective:     Pt admitted with Hyponatremia               Action/Plan: Pt plan to discharge back to [facility]. [facility]. The patient will need to sign up for service there/call to [place] spoke with Manuela Schwartz.    Expected Discharge Date:  (unknown)               Expected Discharge Plan:  Assisted Living / Rest Home([facility] )  In-House Referral:  Clinical Social Work  Discharge planning Services  CM Consult  Post Acute Care Choice:    Choice offered to:     DME Arranged:    DME Agency:     HH Arranged:    [place] Agency:     Status of Service:  In process, will continue to follow  If discussed at Long Length of Stay Meetings, dates discussed:    Additional CommentsPurcell Mouton, RN 11/04/2017, 1:09 PM

## 2017-11-04 NOTE — Progress Notes (Signed)
PROGRESS NOTE    Cassidy Ellison  YIF:[id number] DOB: [birth year] DOA: 11/01/2017 PCP: Harlan Stains, MD   Brief Narrative:  84 year old female with history of DVT, hypertension, hyperlipidemia who was sent to the hospital from the PCPs office for evaluation of hyponatremia with sodium 113.  Initially she was placed on fluid restriction and started on salt tablets.  Her sodium improved at first but then started downtrending.  Upon further evaluation it appears she could also been dehydrated therefore started on normal saline which improved her sodium 126.   Assessment & Plan:   Principal Problem:   Hyponatremia Active Problems:   Essential hypertension  Acute metabolic encephalopathy, resolved Hyponatremia, slowly improving - Patient sodium has been very gradually improving.  This morning gets 129.  At this point I will continue gentle hydration.  Once is closer to her baseline of 133, will discharge her to her facility.  Essential hypertension -Continue Norvasc 5 mg daily.  Losartan is on hold  Hyperlipidemia -Continue atorvastatin 20 mg daily  Abnormal Breath sounds, with history of Bronchitis; improved  -Nebulizer treatments as needed. Cont Oral Prednisone.   DVT prophylaxis: Lovenox Code Status: Do not resuscitate Family Communication:  Son and daughter is at bedside . Disposition Plan: Maintain inpatient stay for more IVF until her Na as reached baseline ~133. She is elderly frail - therefore avoid aggressive management.   Consultants:   None   Procedures:   None   Antimicrobials:   None    Subjective: Doing well, no complaints.   Review of Systems Otherwise negative except as per HPI, including: General = no fevers, chills, dizziness, malaise, fatigue HEENT/EYES = negative for pain, redness, loss of vision, double vision, blurred vision, loss of hearing, sore throat, hoarseness, dysphagia Cardiovascular= negative for chest pain, palpitation, murmurs, lower  extremity swelling Respiratory/lungs= negative for shortness of breath, cough, hemoptysis, wheezing, mucus production Gastrointestinal= negative for nausea, vomiting,, abdominal pain, melena, hematemesis Genitourinary= negative for Dysuria, Hematuria, Change in Urinary Frequency MSK = Negative for arthralgia, myalgias, Back Pain, Joint swelling  Neurology= Negative for headache, seizures, numbness, tingling  Psychiatry= Negative for anxiety, depression, suicidal and homocidal ideation Allergy/Immunology= Medication/Food allergy as listed  Skin= Negative for Rash, lesions, ulcers, itching    Objective: Vitals:   11/03/17 0907 11/03/17 1441 11/03/17 2054 11/04/17 0555  BP:  (!) 115/57 (!) 148/72 (!) 161/68  Pulse:  68 68 64  Resp:  18 20 20   Temp:  97.8 F (36.6 C) 98 F (36.7 C) 97.9 F (36.6 C)  TempSrc:  Oral Oral Oral  SpO2: 97% 96% 97% 99%  Weight:      Height:        Intake/Output Summary (Last 24 hours) at 11/04/2017 1138 Last data filed at 11/04/2017 0157 Gross per 24 hour  Intake 420 ml  Output 400 ml  Net 20 ml   Filed Weights   11/01/17 1756  Weight: 65.8 kg (145 lb)    Examination: Constitutional: NAD, calm, comfortable; elderly frail  Eyes: PERRL, lids and conjunctivae normal ENMT: Mucous membranes are moist. Posterior pharynx clear of any exudate or lesions.Normal dentition.  Neck: normal, supple, no masses, no thyromegaly Respiratory: slightly coarse BS Cardiovascular: Regular rate and rhythm, no murmurs / rubs / gallops. No extremity edema. 2+ pedal pulses. No carotid bruits.  Abdomen: no tenderness, no masses palpated. No hepatosplenomegaly. Bowel sounds positive.  Musculoskeletal: no clubbing / cyanosis. No joint deformity upper and lower extremities. Good ROM, no contractures. Normal muscle tone.  Skin: no rashes, lesions, ulcers. No induration Neurologic: CN 2-12 grossly intact. Sensation intact, DTR normal. Strength 5/5 in all 4.  Psychiatric:  Normal judgment and insight. Alert and oriented x 3. Normal mood.    Data Reviewed:   CBC: Recent Labs  Lab 11/01/17 1828  WBC 6.5  NEUTROABS 5.9  HGB 12.1  HCT 34.2*  MCV 78.8  PLT 073   Basic Metabolic Panel: Recent Labs  Lab 11/02/17 0446 11/02/17 1605 11/03/17 0449 11/03/17 1543 11/04/17 0410  NA 120* 119* 126* 126* 129*  K 3.7 3.5 3.5 4.4 4.3  CL 86* 86* 94* 94* 98  CO2 25 23 24 26 23   GLUCOSE 122* 163* 102* 169* 108*  BUN 13 14 13 13 15   CREATININE 0.68 0.94 0.72 0.85 0.65  CALCIUM 9.1 8.8* 9.1 8.9 9.0   GFR: Estimated Creatinine Clearance: 39.5 mL/min (by C-G formula based on SCr of 0.65 mg/dL). Liver Function Tests: Recent Labs  Lab 11/01/17 1828  AST 34  ALT 23  ALKPHOS 76  BILITOT 0.9  PROT 7.1  ALBUMIN 3.7   No results for input(s): LIPASE, AMYLASE in the last 168 hours. No results for input(s): AMMONIA in the last 168 hours. Coagulation Profile: No results for input(s): INR, PROTIME in the last 168 hours. Cardiac Enzymes: No results for input(s): CKTOTAL, CKMB, CKMBINDEX, TROPONINI in the last 168 hours. BNP (last 3 results) No results for input(s): PROBNP in the last 8760 hours. HbA1C: No results for input(s): HGBA1C in the last 72 hours. CBG: No results for input(s): GLUCAP in the last 168 hours. Lipid Profile: No results for input(s): CHOL, HDL, LDLCALC, TRIG, CHOLHDL, LDLDIRECT in the last 72 hours. Thyroid Function Tests: No results for input(s): TSH, T4TOTAL, FREET4, T3FREE, THYROIDAB in the last 72 hours. Anemia Panel: No results for input(s): VITAMINB12, FOLATE, FERRITIN, TIBC, IRON, RETICCTPCT in the last 72 hours. Sepsis Labs: No results for input(s): PROCALCITON, LATICACIDVEN in the last 168 hours.  Recent Results (from the past 240 hour(s))  MRSA PCR Screening     Status: None   Collection Time: 11/02/17  6:52 AM  Result Value Ref Range Status   MRSA by PCR NEGATIVE NEGATIVE Final    Comment:        The GeneXpert MRSA  Assay (FDA approved for NASAL specimens only), is one component of a comprehensive MRSA colonization surveillance program. It is not intended to diagnose MRSA infection nor to guide or monitor treatment for MRSA infections. Performed at [facility], [place] [address], [place], [place] [address]          Radiology Studies: Dg Chest Port 1 View  Result Date: 11/03/2017 CLINICAL DATA:  Dyspnea EXAM: PORTABLE CHEST 1 VIEW COMPARISON:  11/01/2017 FINDINGS: Cardiac shadows within normal limits. The lungs are well aerated bilaterally. Aortic calcifications are again seen. No acute bony abnormality is seen. IMPRESSION: No active disease. Electronically Signed   By: Inez Catalina M.D.   On: 11/03/2017 08:17        Scheduled Meds: . amLODipine  5 mg Oral QHS  . atorvastatin  20 mg Oral Daily  . clopidogrel  75 mg Oral Daily  . enoxaparin (LOVENOX) injection  40 mg Subcutaneous QHS  . metoprolol succinate  50 mg Oral Daily  . pantoprazole  40 mg Oral Daily  . predniSONE  40 mg Oral Q breakfast  . rOPINIRole  1 mg Oral QHS  . sodium chloride  2 g Oral BID WC  Continuous Infusions: . sodium chloride       LOS: 3 days    I have spent 25 minutes face to face with the patient and on the ward discussing the patients care, assessment, plan and disposition with other care givers. >50% of the time was devoted counseling the patient about the risks and benefits of treatment and coordinating care.     Welcome Fults Arsenio Loader, MD [facility] Pager [phone]   If 7PM-7AM, please contact night-coverage [email or web address] Password [email or web address] 11/04/2017, 11:38 AM

## 2017-11-04 NOTE — Care Management Important Message (Signed)
Important Message  Patient Details  Name: ELEYNA BRUGH MRN: [id number] Date of Birth: 1921-12-14   Medicare Important Message Given:  Yes    Kerin Salen 11/04/2017, 10:04 AMImportant Message  Patient Details  Name: KERENSA NICKLAS MRN: [id number] Date of Birth: [birth year]   Medicare Important Message Given:  Yes    Kerin Salen 11/04/2017, 10:04 AM

## 2017-11-05 LAB — BASIC METABOLIC PANEL
Anion gap: 5 (ref 5–15)
BUN: 17 mg/dL (ref 8–23)
CALCIUM: 9.2 mg/dL (ref 8.9–10.3)
CHLORIDE: 98 mmol/L (ref 98–111)
CO2: 27 mmol/L (ref 22–32)
CREATININE: 0.69 mg/dL (ref 0.44–1.00)
GFR calc non Af Amer: >60 mL/min (ref >60)
GLUCOSE: 110 mg/dL — AB (ref 70–99)
Potassium: 3.9 mmol/L (ref 3.5–5.1)
Sodium: 130 mmol/L — ABNORMAL LOW (ref 135–145)

## 2017-11-05 MED ORDER — PREDNISONE 20 MG PO TABS: 40.0000 mg | ORAL_TABLET | Freq: Every day | ORAL | 0 refills | Status: AC

## 2017-11-05 MED ORDER — SODIUM CHLORIDE 1 G PO TABS: 2.0000 g | ORAL_TABLET | Freq: Two times a day (BID) | ORAL | 0 refills | Status: AC

## 2017-11-05 NOTE — Progress Notes (Signed)
Patient and son verbalized understanding of discharge instructions. Patient is stable at discharge.

## 2017-11-05 NOTE — Discharge Summary (Signed)
Physician Discharge Summary  Cassidy Ellison IEP:[id number] DOB: [birth year] DOA: 11/01/2017  PCP: Harlan Stains, MD  Admit date: 11/01/2017 Discharge date: 11/05/2017  Admitted From: Home  Disposition:  Home   Recommendations for Outpatient Follow-up:  1. Follow up with PCP in 4-6 days 2. Please obtain BMP/CBC in 4 days your next doctors visit.  3. Take Prednisone orally for 2 more days  4. Take Na Cl 2g BID for 4 days    Discharge Condition: Stable CODE STATUS: DNR Diet recommendation: Regular Diet    Brief/Interim Summary: 84 year old female with history of DVT, hypertension, hyperlipidemia who was sent to the hospital from the PCPs office for evaluation of hyponatremia with sodium 113.  Initially she was placed on fluid restriction and started on salt tablets.  Her sodium improved at first but then started downtrending.  Upon further evaluation it appears she could also been dehydrated therefore started on normal saline which improved her sodium 126.  Over the course of next couple of days her sodium slowly continue to improve with IV fluids and salt tablet 230 which is close to her baseline of 133. She also had some abnormal breath sounds initially entering the hospital stay therefore started on nebulizer treatments and short course of oral prednisone which she tolerated well.  On the day of discharge her breath sounds were clear and was decided to complete prednisone course therefore 2 more days of prednisone was prescribed.  This time she is reached maximum benefit from an hospital stay and her mentation has remained at baseline-awake alert oriented.  She will be discharged today with outpatient follow-up recommendations as stated above.     Discharge Diagnoses:  Principal Problem:   Hyponatremia Active Problems:   Essential hypertension  Acute metabolic encephalopathy, resolved Hyponatremia, Improved.  -  Sodium is gradually improved.  It has approached baseline of 133, this  morning is 130.  This is somewhat plateaued.  At this point we will discharge her on sodium chloride tablet for 4 more days, advised her to continue to hydrate herself orally and follow with outpatient primary care physician in next 4-6 days and get a repeat lab work done at that time.  Essential hypertension -Resume home medications  Hyperlipidemia -Continue atorvastatin 20 mg daily  Abnormal Breath sounds, with history of Bronchitis; improved  -Resolved.  Will complete 2 more days of oral prednisone.  DNR Lovenox for PPx while here.   Discharge Instructions   Allergies as of 11/05/2017      Reactions   Adhesive [tape] Itching   Chlorthalidone Other (See Comments)   creatinine  increases   Hydrochlorothiazide Other (See Comments)   CREATININE INCREASES   Oxsoralen [methoxsalen]       Medication List    TAKE these medications   amLODipine 5 MG tablet Commonly known as:  NORVASC Take 5 mg by mouth at bedtime.   atorvastatin 20 MG tablet Commonly known as:  LIPITOR Take 20 mg by mouth daily.   CALCIUM 600 PO Take 600 mg by mouth daily.   clopidogrel 75 MG tablet Commonly known as:  PLAVIX Take 1 tablet (75 mg total) by mouth daily.   losartan 100 MG tablet Commonly known as:  COZAAR Take 100 mg by mouth daily.   metoprolol succinate 50 MG 24 hr tablet Commonly known as:  TOPROL-XL Take 1 tablet (50 mg total) by mouth daily.   multivitamin with minerals tablet Take 1 tablet by mouth daily.   pantoprazole 40 MG tablet Commonly  known as:  PROTONIX Take 1 tablet (40 mg total) by mouth daily.   predniSONE 20 MG tablet Commonly known as:  DELTASONE Take 2 tablets (40 mg total) by mouth daily with breakfast for 2 days. Start taking on:  11/06/2017   rOPINIRole 1 MG tablet Commonly known as:  REQUIP Take 1 mg by mouth at bedtime.   sodium chloride 1 g tablet Take 2 tablets (2 g total) by mouth 2 (two) times daily with a meal for 4 days.   vitamin C 500  MG tablet Commonly known as:  ASCORBIC ACID Take 500 mg by mouth daily.   Vitamin D-3 1000 units Caps Take 1,000 Units by mouth daily.      Follow-up Information    Harlan Stains, MD. Schedule an appointment as soon as possible for a visit in 4 day(s).   Specialty:  Family Medicine Contact information: [place] [id number] [phone]          Allergies  Allergen Reactions  . Adhesive [Tape] Itching  . Chlorthalidone Other (See Comments)    creatinine  increases  . Hydrochlorothiazide Other (See Comments)    CREATININE INCREASES  . Oxsoralen [Methoxsalen]     You were cared for by a hospitalist during your hospital stay. If you have any questions about your discharge medications or the care you received while you were in the hospital after you are discharged, you can call the unit and asked to speak with the hospitalist on call if the hospitalist that took care of you is not available. Once you are discharged, your primary care physician will handle any further medical issues. Please note that no refills for any discharge medications will be authorized once you are discharged, as it is imperative that you return to your primary care physician (or establish a relationship with a primary care physician if you do not have one) for your aftercare needs so that they can reassess your need for medications and monitor your lab values.  Consultations:  None   Procedures/Studies: Dg Chest 2 View  Result Date: 11/01/2017 CLINICAL DATA:  Episodes of coughing and wheezing over the last 4 months EXAM: CHEST - 2 VIEW COMPARISON:  Chest x-ray of 06/22/2017 FINDINGS: No pneumonia or effusion is seen. There is mild peribronchial thickening which could indicate bronchitis. Mediastinal and hilar contours are unremarkable. The heart is borderline enlarged and stable. There are degenerative changes in the mid to lower thoracic spine. IMPRESSION: 1. No active lung  disease. 2. Cannot exclude bronchitis. Electronically Signed   By: Ivar Drape M.D.   On: 11/01/2017 14:18   Dg Chest Port 1 View  Result Date: 11/03/2017 CLINICAL DATA:  Dyspnea EXAM: PORTABLE CHEST 1 VIEW COMPARISON:  11/01/2017 FINDINGS: Cardiac shadows within normal limits. The lungs are well aerated bilaterally. Aortic calcifications are again seen. No acute bony abnormality is seen. IMPRESSION: No active disease. Electronically Signed   By: Inez Catalina M.D.   On: 11/03/2017 08:17      Subjective: No complaints. Feels better. Her mentation is back to her baseline.   General = no fevers, chills, dizziness, malaise, fatigue HEENT/EYES = negative for pain, redness, loss of vision, double vision, blurred vision, loss of hearing, sore throat, hoarseness, dysphagia Cardiovascular= negative for chest pain, palpitation, murmurs, lower extremity swelling Respiratory/lungs= negative for shortness of breath, cough, hemoptysis, wheezing, mucus production Gastrointestinal= negative for nausea, vomiting,, abdominal pain, melena, hematemesis Genitourinary= negative for Dysuria, Hematuria, Change in Urinary  Frequency MSK = Negative for arthralgia, myalgias, Back Pain, Joint swelling  Neurology= Negative for headache, seizures, numbness, tingling  Psychiatry= Negative for anxiety, depression, suicidal and homocidal ideation Allergy/Immunology= Medication/Food allergy as listed  Skin= Negative for Rash, lesions, ulcers, itching   Discharge Exam: Vitals:   11/05/17 0647 11/05/17 0814  BP: (!) 186/88 (!) 150/81  Pulse: 84 70  Resp:  16  Temp:  98.2 F (36.8 C)  SpO2: 100% 98%   Vitals:   11/05/17 0646 11/05/17 0647 11/05/17 0647 11/05/17 0814  BP: (!) 176/79 (!) 186/88 (!) 186/88 (!) 150/81  Pulse: 73  84 70  Resp: 20   16  Temp: 97.8 F (36.6 C)   98.2 F (36.8 C)  TempSrc: Oral   Oral  SpO2: 100%  100% 98%  Weight:      Height:        General: Pt is alert, awake, not in acute  distress; elderly appearing.  Cardiovascular: RRR, S1/S2 +, no rubs, no gallops Respiratory: CTA bilaterally, no wheezing, no rhonchi Abdominal: Soft, NT, ND, bowel sounds + Extremities: no edema, no cyanosis    The results of significant diagnostics from this hospitalization (including imaging, microbiology, ancillary and laboratory) are listed below for reference.     Microbiology: Recent Results (from the past 240 hour(s))  MRSA PCR Screening     Status: None   Collection Time: 11/02/17  6:52 AM  Result Value Ref Range Status   MRSA by PCR NEGATIVE NEGATIVE Final    Comment:        The GeneXpert MRSA Assay (FDA approved for NASAL specimens only), is one component of a comprehensive MRSA colonization surveillance program. It is not intended to diagnose MRSA infection nor to guide or monitor treatment for MRSA infections. Performed at [facility], [place] [address] [place], [place] [address]      Labs: BNP (last 3 results) Recent Labs    11/03/17 0449  BNP 725.3*   Basic Metabolic Panel: Recent Labs  Lab 11/03/17 0449 11/03/17 1543 11/04/17 0410 11/04/17 1622 11/05/17 0352  NA 126* 126* 129* 130* 130*  K 3.5 4.4 4.3 4.2 3.9  CL 94* 94* 98 99 98  CO2 24 26 23 24 27   GLUCOSE 102* 169* 108* 144* 110*  BUN 13 13 15 18 17   CREATININE 0.72 0.85 0.65 0.90 0.69  CALCIUM 9.1 8.9 9.0 8.9 9.2   Liver Function Tests: Recent Labs  Lab 11/01/17 1828  AST 34  ALT 23  ALKPHOS 76  BILITOT 0.9  PROT 7.1  ALBUMIN 3.7   No results for input(s): LIPASE, AMYLASE in the last 168 hours. No results for input(s): AMMONIA in the last 168 hours. CBC: Recent Labs  Lab 11/01/17 1828  WBC 6.5  NEUTROABS 5.9  HGB 12.1  HCT 34.2*  MCV 78.8  PLT 190   Cardiac Enzymes: No results for input(s): CKTOTAL, CKMB, CKMBINDEX, TROPONINI in the last 168 hours. BNP: Invalid input(s): POCBNP CBG: No results for input(s): GLUCAP in the last 168  hours. D-Dimer No results for input(s): DDIMER in the last 72 hours. Hgb A1c No results for input(s): HGBA1C in the last 72 hours. Lipid Profile No results for input(s): CHOL, HDL, LDLCALC, TRIG, CHOLHDL, LDLDIRECT in the last 72 hours. Thyroid function studies No results for input(s): TSH, T4TOTAL, T3FREE, THYROIDAB in the last 72 hours.  Invalid input(s): FREET3 Anemia work up No results for input(s): VITAMINB12, FOLATE, FERRITIN, TIBC, IRON, RETICCTPCT in the last  72 hours. Urinalysis    Component Value Date/Time   COLORURINE YELLOW 11/01/2017 1853   APPEARANCEUR CLEAR 11/01/2017 1853   LABSPEC 1.005 11/01/2017 1853   PHURINE 7.0 11/01/2017 1853   GLUCOSEU NEGATIVE 11/01/2017 1853   HGBUR NEGATIVE 11/01/2017 1853   BILIRUBINUR NEGATIVE 11/01/2017 1853   KETONESUR NEGATIVE 11/01/2017 1853   PROTEINUR NEGATIVE 11/01/2017 1853   NITRITE NEGATIVE 11/01/2017 1853   LEUKOCYTESUR NEGATIVE 11/01/2017 1853   Sepsis Labs Invalid input(s): PROCALCITONIN,  WBC,  LACTICIDVEN Microbiology Recent Results (from the past 240 hour(s))  MRSA PCR Screening     Status: None   Collection Time: 11/02/17  6:52 AM  Result Value Ref Range Status   MRSA by PCR NEGATIVE NEGATIVE Final    Comment:        The GeneXpert MRSA Assay (FDA approved for NASAL specimens only), is one component of a comprehensive MRSA colonization surveillance program. It is not intended to diagnose MRSA infection nor to guide or monitor treatment for MRSA infections. Performed at [facility], [place] [address], [place], [place] [address]      Time coordinating discharge:  I have spent 35 minutes face to face with the patient and on the ward discussing the patients care, assessment, plan and disposition with other care givers. >50% of the time was devoted counseling the patient about the risks and benefits of treatment/Discharge disposition and coordinating care.   SIGNED:   Damita Lack,  MD  [facility] 11/05/2017, 9:42 AM Pager   If 7PM-7AM, please contact night-coverage [email or web address] Password [email or web address]

## 2017-11-09 DIAGNOSIS — R0689 Other abnormalities of breathing: Secondary | ICD-10-CM | POA: Diagnosis not present

## 2017-11-09 DIAGNOSIS — E871 Hypo-osmolality and hyponatremia: Secondary | ICD-10-CM | POA: Diagnosis not present

## 2017-11-09 DIAGNOSIS — K219 Gastro-esophageal reflux disease without esophagitis: Secondary | ICD-10-CM | POA: Diagnosis not present

## 2017-11-11 DIAGNOSIS — E871 Hypo-osmolality and hyponatremia: Secondary | ICD-10-CM | POA: Diagnosis not present

## 2017-11-16 DIAGNOSIS — E871 Hypo-osmolality and hyponatremia: Secondary | ICD-10-CM | POA: Diagnosis not present

## 2017-11-23 DIAGNOSIS — E871 Hypo-osmolality and hyponatremia: Secondary | ICD-10-CM | POA: Diagnosis not present

## 2017-11-25 ENCOUNTER — Ambulatory Visit (INDEPENDENT_AMBULATORY_CARE_PROVIDER_SITE_OTHER): Payer: Medicare Other | Admitting: Podiatry

## 2017-11-25 ENCOUNTER — Encounter: Payer: Self-pay | Admitting: Podiatry

## 2017-11-25 ENCOUNTER — Ambulatory Visit (INDEPENDENT_AMBULATORY_CARE_PROVIDER_SITE_OTHER): Payer: Medicare Other

## 2017-11-25 DIAGNOSIS — M79675 Pain in left toe(s): Secondary | ICD-10-CM

## 2017-11-25 DIAGNOSIS — M2042 Other hammer toe(s) (acquired), left foot: Secondary | ICD-10-CM | POA: Diagnosis not present

## 2017-11-25 DIAGNOSIS — L84 Corns and callosities: Secondary | ICD-10-CM

## 2017-11-25 DIAGNOSIS — B351 Tinea unguium: Secondary | ICD-10-CM | POA: Diagnosis not present

## 2017-11-25 DIAGNOSIS — M79674 Pain in right toe(s): Secondary | ICD-10-CM

## 2017-11-25 DIAGNOSIS — Z9229 Personal history of other drug therapy: Secondary | ICD-10-CM

## 2017-11-27 NOTE — Progress Notes (Signed)
Subjective: Cassidy Ellison presents to clinic today with cc of painful, discolored, thick toenails and lesions b/l 2nd, left 3rd digits which interfere with activities of daily living/performing routine tasks. Pain is aggravated with weightbearing with/without shoe gear. Pain is getting progressively worse and is relieved with periodic professional debridement.  Medications include blood thinner clopidogrel.  Objective: There were no vitals filed for this visit. Vascular Examination: Capillary refill time <3 seconds x 10 digits Dorsalis pedis pulses palpable b/l Posterior tibial pulses faintly palpable b/l No digital hair x 10 digits Skin temperature warm to cool b/l  Dermatological Examination: Skin with normal turgor, texture and tone b/l. Toenails 1-5 b/l discolored, thick, dystrophic with subungual debris and pain with palpation to nailbeds due to thickness of nails.  Hyperkeratotic lesions noted distal tip b/l 2nd digits, left 3rd and plantarmedial 1st metatarsal heads b/l  Musculoskeletal: Muscle strength 5/5 to all LE muscle groups Rigid hammertoe deformity left 2nd digit Semirigid hammertoe deformity left 3rd, right 3rd digit.  Neurological: Sensation intact with 10 gram monofilament. Vibratory sensation intact.  Xrays left foot significant for severe hallux abductovalgus with bunion deformity and overlapping hammertoe left 2nd digit. Hammertoe left 3rd digit.  Assessment: Painful onychomycosis toenails 1-5 b/l in patient on blood thinner.  Total hyperkeratotic lesions+=5 Corns distal tip b/l 2nd and left 3rd digits Callus plantarmedial 1st metatarsal head b/l  Plan: 1. Toenails 1-5 b/l were debrided in length and girth without iatrogenic bleeding. 2. Hyperkeratotic lesion debrided x 5: distal tip b/l 2nd and left 3rd, plantarmedial 1st metatarsal head b/l 3. Patient to continue soft, supportive shoe gear 4. Patient to report any pedal injuries to medical professional  immediately. 5. Avoid self trimming due to use of blood thinner. 6. Follow up 9 weeks. 7.  Patient/POA to call should there be a concern in the interim.

## 2017-11-30 DIAGNOSIS — R399 Unspecified symptoms and signs involving the genitourinary system: Secondary | ICD-10-CM | POA: Diagnosis not present

## 2017-12-05 ENCOUNTER — Ambulatory Visit: Payer: Medicare Other | Admitting: Adult Health

## 2017-12-06 ENCOUNTER — Ambulatory Visit (INDEPENDENT_AMBULATORY_CARE_PROVIDER_SITE_OTHER): Payer: Medicare Other | Admitting: Adult Health

## 2017-12-06 ENCOUNTER — Encounter: Payer: Self-pay | Admitting: Adult Health

## 2017-12-06 VITALS — BP 132/73 | HR 75 | Wt 142.0 lb

## 2017-12-06 DIAGNOSIS — I10 Essential (primary) hypertension: Secondary | ICD-10-CM

## 2017-12-06 DIAGNOSIS — G459 Transient cerebral ischemic attack, unspecified: Secondary | ICD-10-CM

## 2017-12-06 DIAGNOSIS — E785 Hyperlipidemia, unspecified: Secondary | ICD-10-CM | POA: Diagnosis not present

## 2017-12-06 NOTE — Progress Notes (Signed)
NEUROLOGY CLINIC FOLLOW UP PATIENT NOTE  NAME: Cassidy Ellison DOB: 1922-04-24 REFERRING PHYSICIAN: Harlan Stains, MD  I saw Cassidy Ellison as a new consult in the neurovascular clinic today for follow-up regarding confusion in 06/2017 episode.  Patient is accompanied by her daughter.  HPI: Cassidy Ellison is a 84 y.o. female with PMH of HTN who presents as a new patient for confusion episode. She was in her normal health until 06/13/15 when she finished home PT session and was reading PT instructions. She felt that she had hard time to read and understand the instruction. She tried to call her daughter but not able to use the phone. When daughter came, she was able to talk but still has difficulty with phone and mild confusion. When she was in [facility] ER, her symptoms gradually back to baseline. She denies any HA, migraine hx, visual changes, or heart racing. They did not check BP at time of symptoms. She was admitted for evaluation. EEG normal, MRI showed questionable right pontine punctate infarct vs. Artifact. MRA head, CUS, TTE unremarkable. LDL 122 and A1C 6.0. She was discharged with ASA 325 and lipitor 20.   She has Hx of HTN, on norvasc 10mg , cozaar 100mg , and toprol 100mg . She admitted that recently her BP was low, and her normal BP should be 120/60, but today only 94/54.   08/12/15 follow up  - she has been doing well. No recurrent episode. Continued on ASA and lipitor. BP at lower side, today 94/54. Denies any dizziness, diaphoresis. Has followed up with PCP Dr. Chapman Fitch.  06/22/17 ED visit - for confusion in the morning.  She got up as normal, but then was unable to figure out how to make her coffee.  Daughter came over about 3 hours later, she told her daughter that "I cannot do that" and pointed to the TV and to the coffee pot. Cannot name her daughter.  She went to ER, BP 120/57, had CT and MRI brain no acute abnormality.  She was discharged from ER with PCP follow-up.  Symptoms lasted entire day.  Second  day as per daughter she also had a very brief similar episode.  She denies headache, weakness, or numbness.  She remembered whole situation.  Went to PCPs second day, increase aspirin 325 from daily to twice daily.  She was referred back here for further evaluation.  09/05/2017 visit JX: During the interval time, patient has been doing well.  No further episode.  Daughter said she had 2 minor falls during the interval time.  Patient claimed that she had headache 2 days ago and lasted for about 2 days.  The headache is at the left vertex, not constant, achy pain lasting seconds to minutes, even at night.  Pt had 30 day cardiac monitoring negative for Afib. BP today 127/76. She is on norvasc 5, losartan 100 and metoprolol 50 daily.   Interval history: Patient is being seen today for routine follow-up and is accompanied by her daughter.  She states she is overall been doing well and denies recent headaches.  She continues to live independently in independent living facility at [facility].  She continues to use rolling walker for balance and also has a "safety net".  Continues to take Plavix with mild bruising but no bleeding.  Continues to take Lipitor without side effects myalgias.  Blood pressure today satisfactory at 132/73 and patient does monitor this at home.  Patient was hospitalized on 11/01/2017 due to hyponatremia at  127.  Protonix was stopped as this can contribute to hyponatremia and patient was started on sodium tablets and sodium began to elevate during hospitalization.  Per daughter, they will be having a repeat lab test in 2 to 3 days and this is monitored by PCP.  Denies new or worsening stroke/TIA symptoms.     Past Medical History:  Diagnosis Date  . Carpal tunnel syndrome    left  . DVT (deep venous thrombosis) ([place])   . GERD (gastroesophageal reflux disease)   . Hyperlipemia   . Hypertension   . RLS (restless legs syndrome)    Past Surgical History:  Procedure Laterality Date    . ABDOMINAL HYSTERECTOMY    . CARPAL TUNNEL RELEASE Left 10/31/2014   Procedure: LEFT CARPAL TUNNEL RELEASE;  Surgeon: Leanora Cover, MD;  Location: [place];  Service: Orthopedics;  Laterality: Left;  . CARPAL TUNNEL RELEASE Right 02/12/2016   Procedure: CARPAL TUNNEL RELEASE, right;  Surgeon: Leanora Cover, MD;  Location: [place];  Service: Orthopedics;  Laterality: Right;  . GALLBLADDER SURGERY     Family History  Problem Relation Age of Onset  . Heart failure Father   . Heart failure Mother   . Cancer Sister        breast cancer  . Diabetes Brother    Current Outpatient Medications  Medication Sig Dispense Refill  . amLODipine (NORVASC) 5 MG tablet Take 5 mg by mouth at bedtime.     Marland Kitchen atorvastatin (LIPITOR) 20 MG tablet Take 20 mg by mouth daily.     . betamethasone dipropionate (DIPROLENE) 0.05 % ointment APPLY TO RASH ON BACK AND LEFT THIGH TWICE DAILY  2  . Calcium Carbonate (CALCIUM 600 PO) Take 600 mg by mouth daily.    . Cholecalciferol (VITAMIN D-3) 1000 units CAPS Take 1,000 Units by mouth daily.    . clopidogrel (PLAVIX) 75 MG tablet Take 1 tablet (75 mg total) by mouth daily. 90 tablet 3  . losartan (COZAAR) 100 MG tablet Take 100 mg by mouth daily.    . metoprolol succinate (TOPROL-XL) 50 MG 24 hr tablet Take 1 tablet (50 mg total) by mouth daily. 30 tablet 3  . Multiple Vitamins-Minerals (MULTIVITAMIN WITH MINERALS) tablet Take 1 tablet by mouth daily.     Marland Kitchen rOPINIRole (REQUIP) 1 MG tablet Take 1 mg by mouth at bedtime.    . sodium chloride 1 g tablet TK 2 TS PO BID WAC  0  . vitamin C (ASCORBIC ACID) 500 MG tablet Take 500 mg by mouth daily.     No current facility-administered medications for this visit.    Allergies  Allergen Reactions  . Adhesive [Tape] Itching  . Chlorthalidone Other (See Comments)    creatinine  increases  . Hydrochlorothiazide Other (See Comments)    CREATININE INCREASES  . Oxsoralen [Methoxsalen]     Social History   Socioeconomic History  . Marital status: Widowed    Spouse name: Not on file  . Number of children: Not on file  . Years of education: Not on file  . Highest education level: Not on file  Occupational History  . Not on file  Social Needs  . Financial resource strain: Not on file  . Food insecurity:    Worry: Not on file    Inability: Not on file  . Transportation needs:    Medical: Not on file    Non-medical: Not on file  Tobacco Use  . Smoking status:  Former Smoker  . Smokeless tobacco: Never Used  . Tobacco comment: quit 20years ago  Substance and Sexual Activity  . Alcohol use: Yes    Comment: social events  . Drug use: No  . Sexual activity: Not on file  Lifestyle  . Physical activity:    Days per week: Not on file    Minutes per session: Not on file  . Stress: Not on file  Relationships  . Social connections:    Talks on phone: Not on file    Gets together: Not on file    Attends religious service: Not on file    Active member of club or organization: Not on file    Attends meetings of clubs or organizations: Not on file    Relationship status: Not on file  . Intimate partner violence:    Fear of current or ex partner: Not on file    Emotionally abused: Not on file    Physically abused: Not on file    Forced sexual activity: Not on file  Other Topics Concern  . Not on file  Social History Narrative  . Not on file    Review of Systems Full 14 system review of systems performed and notable only for those listed, all others are neg:  Constitutional:   Cardiovascular:  Ear/Nose/Throat:   Skin:  Eyes:   Respiratory:   Gastroitestinal:   Genitourinary:  Hematology/Lymphatic:   Endocrine:  Musculoskeletal:   Allergy/Immunology:   Neurological:  Speech difficulty Psychiatric:  Sleep:    Physical Exam  Vitals:   12/06/17 0908  BP: 132/73  Pulse: 75    General -frail elderly pleasant Caucasian female, in no apparent  distress.  Ophthalmologic - Sharp disc margins OU.  Cardiovascular - Regular rate and rhythm with no murmur. Carotid pulses were 2+ without bruits .   Neck - supple, no nuchal rigidity .  Mental Status -  Level of arousal and orientation to time, place, and person were intact. Language including expression, naming, repetition, comprehension, reading, and writing was assessed and found intact. Attention span and concentration were normal. Recent and remote memory were intact. Fund of Knowledge was assessed and was intact.  Cranial Nerves II - XII - II - Visual field intact OU. III, IV, VI - Extraocular movements intact. V - Facial sensation intact bilaterally. VII - Facial movement intact bilaterally. VIII - Hearing & vestibular intact bilaterally. X - Palate elevates symmetrically. XI - Chin turning & shoulder shrug intact bilaterally. XII - Tongue protrusion intact.  Motor Strength - The patient's strength was normal in all extremities and pronator drift was absent.  Bulk was normal and fasciculations were absent.   Motor Tone - Muscle tone was assessed at the neck and appendages and was normal.  Reflexes - The patient's reflexes were normal in all extremities and she had no pathological reflexes.  Sensory - Light touch, temperature/pinprick were assessed and were normal.    Coordination - The patient had normal movements in the hands and feet with no ataxia or dysmetria.  Tremor was absent.  Gait and Station - The patient's transfers, posture, gait, station, and turns were observed as normal but does use rolling walker for assistance.   Imaging  I have personally reviewed the radiological images below and agree with the radiology interpretations.  Echocardiogram 09/13/2017 Study Conclusions  - Left ventricle: The cavity size was normal. There was mild focal   basal hypertrophy of the septum. Systolic function was normal.  The estimated ejection fraction was in the  range of 55% to 60%.   Wall motion was normal; there were no regional wall motion   abnormalities. Doppler parameters are consistent with abnormal   left ventricular relaxation (grade 1 diastolic dysfunction). - Aortic valve: Transvalvular velocity was within the normal range.   There was no stenosis. There was trivial regurgitation. - Mitral valve: Transvalvular velocity was within the normal range.   There was no evidence for stenosis. There was no regurgitation. - Right ventricle: The cavity size was normal. Wall thickness was   normal. Systolic function was normal. - Atrial septum: No defect or patent foramen ovale was identified   by color flow Doppler. - Tricuspid valve: There was mild regurgitation. - Pulmonary arteries: Systolic pressure was within the normal   range. PA peak pressure: 26 mm Hg (S).  VAS US carotid duplex bilateral 09/13/2017 Final Interpretation: Right Carotid: Velocities in the right ICA are consistent with a 1-39% stenosis.  Left Carotid: Velocities in the left ICA are consistent with a 1-39% stenosis.  Vertebrals: Bilateral vertebral arteries demonstrate antegrade flow. Subclavians: Normal flow hemodynamics were seen in bilateral subclavian       arteries.     Assessment    Mathilda Maguire is a 84 year old female with questionable right pontine punctate infarct versus artifact on 06/13/2015 and a more recent episode on 06/22/2017 that consisted of confusion with insomnia and diagnosed with TIA versus complicated migraine.  Vascular risk factors include HTN and HLD.  Patient is being seen today for routine follow-up and overall is doing well.   Plan: -Continue Plavix and Lipitor for secondary stroke prevention - check BP at home and record. -Advised to continue to stay active and a healthy diet - Follow up with your primary care physician for stroke risk factor modification. Recommend maintain blood pressure goal around 120/80, diabetes with hemoglobin  A1c goal below 6.5% and lipids with LDL cholesterol goal below 70 mg/dL.  - healthy diet and regular exercise. Avoid fall.   Follow-up as needed or call with questions/concerns  I spent more than 25 minutes of face to face time with the patient. Greater than 50% of time was spent in counseling and coordination of care.  Reviewed stroke risk factors of HTN and HLD and reviewed the importance of managing these to avoid future strokes.  Venancio Poisson, AGNP-BC  [facility] [address] [place], [place] [address]  Phone [phone] Fax [phone] Note: This document was prepared with digital dictation and possible smart phrase technology. Any transcriptional errors that result from this process are unintentional.

## 2017-12-06 NOTE — Patient Instructions (Signed)
Continue clopidogrel 75 mg daily  and lipitor  for secondary stroke prevention  Continue to follow up with PCP regarding cholesterol and blood pressure management   continue to stay active and eat a healthy diet  Continue to monitor blood pressure at home  Maintain strict control of hypertension with blood pressure goal below 130/90, diabetes with hemoglobin A1c goal below 6.5% and cholesterol with LDL cholesterol (bad cholesterol) goal below 70 mg/dL. I also advised the patient to eat a healthy diet with plenty of whole grains, cereals, fruits and vegetables, exercise regularly and maintain ideal body weight.  Followup in the future with me as needed or call earlier if needed         Thank you for coming to see [place] at [facility]. I hope we have been able to provide you high quality care today.  You may receive a patient satisfaction survey over the next few weeks. We would appreciate your feedback and comments so that we may continue to improve ourselves and the health of our patients.

## 2017-12-14 DIAGNOSIS — E871 Hypo-osmolality and hyponatremia: Secondary | ICD-10-CM | POA: Diagnosis not present

## 2017-12-22 NOTE — Progress Notes (Signed)
I agree with the above plan 

## 2018-01-27 ENCOUNTER — Ambulatory Visit (INDEPENDENT_AMBULATORY_CARE_PROVIDER_SITE_OTHER): Payer: Medicare Other | Admitting: Podiatry

## 2018-01-27 ENCOUNTER — Encounter: Payer: Self-pay | Admitting: Podiatry

## 2018-01-27 DIAGNOSIS — M79675 Pain in left toe(s): Secondary | ICD-10-CM | POA: Diagnosis not present

## 2018-01-27 DIAGNOSIS — B351 Tinea unguium: Secondary | ICD-10-CM

## 2018-01-27 DIAGNOSIS — M79674 Pain in right toe(s): Secondary | ICD-10-CM

## 2018-01-27 DIAGNOSIS — L84 Corns and callosities: Secondary | ICD-10-CM | POA: Diagnosis not present

## 2018-01-27 DIAGNOSIS — Z9229 Personal history of other drug therapy: Secondary | ICD-10-CM

## 2018-02-01 ENCOUNTER — Institutional Professional Consult (permissible substitution): Payer: Medicare Other | Admitting: Emergency Medicine

## 2018-02-02 ENCOUNTER — Encounter: Payer: Self-pay | Admitting: Podiatry

## 2018-02-02 NOTE — Progress Notes (Signed)
Subjective: Cassidy Ellison is a 84 y.o. y.o. female who presents today for preventative foot care and follow up for painful, discolored, thick toenails b/l and painful corns/calluses b/l feet which interfere with daily activities. Pain is aggravated when weightbearing with and without shoe gear. Pain is relieved with periodic professional debridement.  She states she did look for [employer], but was unable to find them at [employer].   Objective: Vascular Examination: Capillary refill time <3 seconds x 10 digits Dorsalis pedis pulses present b/l Posterior tibial pulses faintly palpable b/l No digital hair x 10 digits Skin temperature warm to cool b/l  Dermatological Examination: Skin with normal turgor, texture and tone b/l  Toenails 1-5 b/l discolored, thick, dystrophic with subungual debris and pain with palpation to nailbeds due to thickness of nails.  Hyperkeratotic lesions noted distal tip b/l 2nd digits, distal tip left 3rd digit and plantarmedial 1st metatarsal heads b/l. No erythema, no edema, no flocculence, no drainage, no warmth  Musculoskeletal: Muscle strength 5/5 to all LE muscle groups Rigid hammertoe deformity left 2nd digit Semi-rigid hammertoe deformity b/l 3rd digits  Neurological: Sensation intact with 10 gram monofilament. Vibratory sensation intact.  Previous left foot xray evidence of severe hallux abductovalgus with bunion deformity, hammertoe left 2nd and 3rd digits   Assessment: 1.  Painful onychomycosis toenails 1-5 b/l in patient on blood thinner 2.  Painful corns distal tip b/l 2nd, left 3rd digit 3. Painful calluses plantarmedial 1st metatarsal heads b/l  Plan: 1. Toenails 1-5 b/l were debrided in length and girth without iatrogenic bleeding. 2. Hyperkeratotic lesions debrided utilizing sterile chisel blade: Corns (distal tip b/l 2nd, left 3rd digit)/calluses (plantarmedial 1st metatarsal heads b/l) pared without incident  3. Revisited shoe gear.  Informed her of stores that carry [employer]chers loafers with memory foam insoles. Also recommended [employer] shoes. She was given handwritten information with the name of the shoes on them.  4. Patient to report any pedal injuries to medical professional immediately. 5. Avoid self trimming due to use of blood thinner. 6. Follow up 9 weeks.  7. Patient/POA to call should there be a concern in the interim.

## 2018-02-09 ENCOUNTER — Encounter: Payer: Self-pay | Admitting: Emergency Medicine

## 2018-02-09 ENCOUNTER — Ambulatory Visit (INDEPENDENT_AMBULATORY_CARE_PROVIDER_SITE_OTHER): Payer: Medicare Other | Admitting: Emergency Medicine

## 2018-02-09 DIAGNOSIS — R911 Solitary pulmonary nodule: Secondary | ICD-10-CM | POA: Diagnosis not present

## 2018-02-09 DIAGNOSIS — R062 Wheezing: Secondary | ICD-10-CM | POA: Diagnosis not present

## 2018-02-09 DIAGNOSIS — R05 Cough: Secondary | ICD-10-CM | POA: Diagnosis not present

## 2018-02-09 DIAGNOSIS — R059 Cough, unspecified: Secondary | ICD-10-CM

## 2018-02-09 NOTE — Patient Instructions (Addendum)
We will perform pulmonary function testing at your next visit. Please try starting Zantac 150 mg once daily.  You can increase this to twice daily if you continue to have breakthrough reflux symptoms Please try starting Allegra 180 mg once daily until next visit. We will hold off on repeating her CT scan of the chest at this time.  We can discuss further going forward.  Also Follow with Dr Lamonte Sakai in 1 month with spirometry on the same day

## 2018-02-09 NOTE — Assessment & Plan Note (Signed)
noted on 2017 and stable on a repeat CT in 06/2016.  Certainly could be consistent with a slow-growing adenocarcinoma.  Agree with a conservative approach here.  We can discuss possible timing of repeat imaging going forward.

## 2018-02-09 NOTE — Assessment & Plan Note (Signed)
She is having cough that is associated with the upper airway noise.  She does describe some breakthrough GERD.  She does not have a lot of rhinitis but she did notice that her symptoms were worse in the springtime.  She has been off her PPI for a few months.  I think will be reasonable to start Zantac and Allegra empirically, see if she gets any benefit in both her cough and her upper airway noise

## 2018-02-09 NOTE — Progress Notes (Signed)
Subjective:    Patient ID: Cassidy Ellison, female    DOB: 04-16-1922, 84 y.o.   MRN: [id number]  HPI This is a new consult visit for 84 year old woman, former smoker (45 pack years) with a history of hypertension, psoriasis, hyperlipidemia, prior DVT, GERD, restless leg syndrome.  Has been followed for a GG nodule in her RUL, stable on repeat last 06/2016. She was hospitalized in June for hyponatremia, sodium 113.  Based on review of the notes it appears that this was felt to be a hypovolemic hyponatremia.  She referred today for evaluation of wheezing, can be intermittent, no clear precipitants. Sounds like it could be UA in nature - others can hear it. Has been treated with pred before x 1, helped. Was trialed on albuterol in the hospital, unclear that it helped much. The wheeze seemed to increase in the Spring, may have had some increase cough. No real change sneezing, etc. She has been on PPI in the past, not since the Spring. She may be having some breakthrough reflux, even when she was on the PPI.    Review of Systems  Constitutional: Negative for fever and unexpected weight change.  HENT: Negative for congestion, dental problem, ear pain, nosebleeds, postnasal drip, rhinorrhea, sinus pressure, sneezing, sore throat and trouble swallowing.   Eyes: Negative for redness and itching.  Respiratory: Positive for cough and wheezing. Negative for chest tightness and shortness of breath.   Cardiovascular: Negative for palpitations and leg swelling.  Gastrointestinal: Negative for nausea and vomiting.  Genitourinary: Negative for dysuria.  Musculoskeletal: Negative for joint swelling.  Skin: Negative for rash.  Neurological: Negative for headaches.  Hematological: Does not bruise/bleed easily.  Psychiatric/Behavioral: Negative for dysphoric mood. The patient is not nervous/anxious.     Past Medical History:  Diagnosis Date  . Carpal tunnel syndrome    left  . DVT (deep venous thrombosis) ([place])    . GERD (gastroesophageal reflux disease)   . Hyperlipemia   . Hypertension   . RLS (restless legs syndrome)      Family History  Problem Relation Age of Onset  . Heart failure Father   . Heart failure Mother   . Cancer Sister        breast cancer  . Diabetes Brother      Social History   Socioeconomic History  . Marital status: Widowed    Spouse name: Not on file  . Number of children: Not on file  . Years of education: Not on file  . Highest education level: Not on file  Occupational History  . Not on file  Social Needs  . Financial resource strain: Not on file  . Food insecurity:    Worry: Not on file    Inability: Not on file  . Transportation needs:    Medical: Not on file    Non-medical: Not on file  Tobacco Use  . Smoking status: Former Smoker    Packs/day: 1.50    Years: 30.00    Pack years: 45.00    Types: Cigarettes    Last attempt to quit: 05/10/1990    Years since quitting: 27.7  . Smokeless tobacco: Never Used  . Tobacco comment: quit 20years ago  Substance and Sexual Activity  . Alcohol use: Yes    Comment: social events  . Drug use: No  . Sexual activity: Not on file  Lifestyle  . Physical activity:    Days per week: Not on file    Minutes per  session: Not on file  . Stress: Not on file  Relationships  . Social connections:    Talks on phone: Not on file    Gets together: Not on file    Attends religious service: Not on file    Active member of club or organization: Not on file    Attends meetings of clubs or organizations: Not on file    Relationship status: Not on file  . Intimate partner violence:    Fear of current or ex partner: Not on file    Emotionally abused: Not on file    Physically abused: Not on file    Forced sexual activity: Not on file  Other Topics Concern  . Not on file  Social History Narrative  . Not on file     Allergies  Allergen Reactions  . Adhesive [Tape] Itching  . Chlorthalidone Other (See Comments)      creatinine  increases  . Hydrochlorothiazide Other (See Comments)    CREATININE INCREASES  . Oxsoralen [Methoxsalen]      Outpatient Medications Prior to Visit  Medication Sig Dispense Refill  . amLODipine (NORVASC) 5 MG tablet Take 5 mg by mouth at bedtime.     Marland Kitchen atorvastatin (LIPITOR) 20 MG tablet Take 20 mg by mouth daily.     . betamethasone dipropionate (DIPROLENE) 0.05 % ointment APPLY TO RASH ON BACK AND LEFT THIGH TWICE DAILY  2  . Calcium Carbonate (CALCIUM 600 PO) Take 600 mg by mouth daily.    . Cholecalciferol (VITAMIN D-3) 1000 units CAPS Take 1,000 Units by mouth daily.    . clopidogrel (PLAVIX) 75 MG tablet Take 1 tablet (75 mg total) by mouth daily. 90 tablet 3  . losartan (COZAAR) 100 MG tablet Take 100 mg by mouth daily.    . metoprolol succinate (TOPROL-XL) 50 MG 24 hr tablet Take 1 tablet (50 mg total) by mouth daily. 30 tablet 3  . Multiple Vitamins-Minerals (MULTIVITAMIN WITH MINERALS) tablet Take 1 tablet by mouth daily.     Marland Kitchen rOPINIRole (REQUIP) 1 MG tablet Take 1 mg by mouth at bedtime.    . sodium chloride 1 g tablet TK 2 TS PO BID WAC  0  . vitamin C (ASCORBIC ACID) 500 MG tablet Take 500 mg by mouth daily.     No facility-administered medications prior to visit.         Objective:   Physical Exam  Vitals:   02/09/18 0940  BP: 118/70  Pulse: 68  SpO2: 94%  Weight: 144 lb (65.3 kg)  Height: 5\' 5"  (1.651 m)  Gen: Pleasant elderly woman, well-nourished, in no distress,  normal affect  ENT: No lesions,  mouth clear,  oropharynx clear, no postnasal drip  Neck: No JVD, very mild expiratory upper airway noise especially on forced expiration  Lungs: No use of accessory muscles, no no crackles, no overt wheezing, some referred upper airway noise on forced expiration as above  Cardiovascular: RRR, heart sounds normal, no murmur or gallops, no peripheral edema  Musculoskeletal: No deformities, no cyanosis or clubbing  Neuro: alert, non  focal  Skin: Warm, no lesions or rash     Assessment & Plan:  Cough She is having cough that is associated with the upper airway noise.  She does describe some breakthrough GERD.  She does not have a lot of rhinitis but she did notice that her symptoms were worse in the springtime.  She has been off her PPI for a few months.  I think will be reasonable to start Zantac and Allegra empirically, see if she gets any benefit in both her cough and her upper airway noise  Wheezing This is a long-standing problem, progressive over the last year.  She describes noise that sounds upper airway in nature.  Its not associated with dyspnea.  It may be impacted by how well her GERD is controlled.  Unclear if maybe there is an allergy component as well.  I think is reasonable to try treating both GERD and allergic rhinitis empirically to see if she gets benefit.  I also wanted to have spirometry to try to differentiate between upper and lower airway noise in this former smoker.  If she continues to have upper airway noise after this evaluation and then she may benefit from bronchoscopy and airway inspection.  I mentioned this to her today.  Pulmonary nodule  noted on 2017 and stable on a repeat CT in 06/2016.  Certainly could be consistent with a slow-growing adenocarcinoma.  Agree with a conservative approach here.  We can discuss possible timing of repeat imaging going forward.  Baltazar Apo, MD, PhD 02/09/2018, 10:22 AM [facility] [phone] or if no answer [phone]

## 2018-02-09 NOTE — Assessment & Plan Note (Signed)
This is a long-standing problem, progressive over the last year.  She describes noise that sounds upper airway in nature.  Its not associated with dyspnea.  It may be impacted by how well her GERD is controlled.  Unclear if maybe there is an allergy component as well.  I think is reasonable to try treating both GERD and allergic rhinitis empirically to see if she gets benefit.  I also wanted to have spirometry to try to differentiate between upper and lower airway noise in this former smoker.  If she continues to have upper airway noise after this evaluation and then she may benefit from bronchoscopy and airway inspection.  I mentioned this to her today.

## 2018-02-28 DIAGNOSIS — E785 Hyperlipidemia, unspecified: Secondary | ICD-10-CM | POA: Diagnosis not present

## 2018-02-28 DIAGNOSIS — R061 Stridor: Secondary | ICD-10-CM | POA: Diagnosis not present

## 2018-02-28 DIAGNOSIS — Z23 Encounter for immunization: Secondary | ICD-10-CM | POA: Diagnosis not present

## 2018-02-28 DIAGNOSIS — E871 Hypo-osmolality and hyponatremia: Secondary | ICD-10-CM | POA: Diagnosis not present

## 2018-02-28 DIAGNOSIS — I10 Essential (primary) hypertension: Secondary | ICD-10-CM | POA: Diagnosis not present

## 2018-02-28 DIAGNOSIS — Z8673 Personal history of transient ischemic attack (TIA), and cerebral infarction without residual deficits: Secondary | ICD-10-CM | POA: Diagnosis not present

## 2018-03-16 DIAGNOSIS — J343 Hypertrophy of nasal turbinates: Secondary | ICD-10-CM | POA: Diagnosis not present

## 2018-03-16 DIAGNOSIS — Z87891 Personal history of nicotine dependence: Secondary | ICD-10-CM | POA: Diagnosis not present

## 2018-03-16 DIAGNOSIS — R061 Stridor: Secondary | ICD-10-CM | POA: Diagnosis not present

## 2018-03-16 DIAGNOSIS — Z7289 Other problems related to lifestyle: Secondary | ICD-10-CM | POA: Diagnosis not present

## 2018-03-16 DIAGNOSIS — H6121 Impacted cerumen, right ear: Secondary | ICD-10-CM | POA: Diagnosis not present

## 2018-03-16 DIAGNOSIS — H938X2 Other specified disorders of left ear: Secondary | ICD-10-CM | POA: Diagnosis not present

## 2018-03-31 ENCOUNTER — Encounter: Payer: Self-pay | Admitting: Podiatry

## 2018-03-31 ENCOUNTER — Ambulatory Visit (INDEPENDENT_AMBULATORY_CARE_PROVIDER_SITE_OTHER): Payer: Medicare Other | Admitting: Podiatry

## 2018-03-31 DIAGNOSIS — B351 Tinea unguium: Secondary | ICD-10-CM | POA: Diagnosis not present

## 2018-03-31 DIAGNOSIS — D689 Coagulation defect, unspecified: Secondary | ICD-10-CM

## 2018-03-31 DIAGNOSIS — M79675 Pain in left toe(s): Secondary | ICD-10-CM | POA: Diagnosis not present

## 2018-03-31 DIAGNOSIS — L84 Corns and callosities: Secondary | ICD-10-CM

## 2018-03-31 DIAGNOSIS — M79674 Pain in right toe(s): Secondary | ICD-10-CM | POA: Diagnosis not present

## 2018-03-31 NOTE — Progress Notes (Signed)
Complaint:  Visit Type: Patient returns to my office for continued preventative foot care services. Complaint: Patient states" my nails have grown long and thick and become painful to walk and wear shoes" Patient has been taking plavix. The patient presents for preventative foot care services. No changes to ROS.    Podiatric Exam: Vascular: dorsalis pedis and posterior tibial pulses are palpable bilateral. Capillary return is immediate. Temperature gradient is WNL. Skin turgor WNL  Sensorium: Normal Semmes Weinstein monofilament test. Normal tactile sensation bilaterally. Nail Exam: Pt has thick disfigured discolored nails with subungual debris noted bilateral entire nail hallux through fifth toenails Ulcer Exam: There is no evidence of ulcer or pre-ulcerative changes or infection. Orthopedic Exam: Muscle tone and strength are WNL. No limitations in general ROM. No crepitus or effusions noted. Foot type and digits show no abnormalities. HAV with contracted digits left foot.  HAV right. Skin: No Porokeratosis. No infection or ulcers  Diagnosis:  Onychomycosis, , Pain in right toe, pain in left toes Clavi 3rd left  Treatment & Plan Procedures and Treatment: Consent by patient was obtained for treatment procedures.   Debridement of mycotic and hypertrophic toenails, 1 through 5 bilateral and clearing of subungual debris. No ulceration, no infection noted. Debride clavi. Return Visit-Office Procedure: Patient instructed to return to the office for a follow up visit 3 months for continued evaluation and treatment.    Gardiner Barefoot DPM

## 2018-04-04 ENCOUNTER — Other Ambulatory Visit: Payer: Self-pay | Admitting: Emergency Medicine

## 2018-04-04 DIAGNOSIS — R911 Solitary pulmonary nodule: Secondary | ICD-10-CM

## 2018-04-04 DIAGNOSIS — R062 Wheezing: Secondary | ICD-10-CM

## 2018-04-04 DIAGNOSIS — R05 Cough: Secondary | ICD-10-CM

## 2018-04-04 DIAGNOSIS — R059 Cough, unspecified: Secondary | ICD-10-CM

## 2018-04-05 ENCOUNTER — Ambulatory Visit (INDEPENDENT_AMBULATORY_CARE_PROVIDER_SITE_OTHER): Payer: Medicare Other | Admitting: Emergency Medicine

## 2018-04-05 ENCOUNTER — Encounter: Payer: Self-pay | Admitting: Emergency Medicine

## 2018-04-05 DIAGNOSIS — R05 Cough: Secondary | ICD-10-CM

## 2018-04-05 DIAGNOSIS — R062 Wheezing: Secondary | ICD-10-CM

## 2018-04-05 DIAGNOSIS — R911 Solitary pulmonary nodule: Secondary | ICD-10-CM

## 2018-04-05 DIAGNOSIS — R059 Cough, unspecified: Secondary | ICD-10-CM

## 2018-04-05 LAB — PULMONARY FUNCTION TEST
FEF 25-75 Pre: 0.66 L/sec
FEF2575-%Pred-Pre: 122 %
FEV1-%Pred-Pre: 96 %
FEV1-Pre: 1.19 L
FEV1FVC-%PRED-PRE: 93 %
FEV6-%PRED-PRE: 115 %
FEV6-Pre: 1.8 L
FEV6FVC-%PRED-PRE: 108 %
FVC-%Pred-Pre: 105 %
FVC-Pre: 1.8 L
PRE FEV1/FVC RATIO: 66 %
PRE FEV6/FVC RATIO: 100 %

## 2018-04-05 MED ORDER — PREDNISONE 10 MG PO TABS: 30.0000 mg | ORAL_TABLET | Freq: Every day | ORAL | 0 refills | Status: DC

## 2018-04-05 MED ORDER — FLUTICASONE PROPIONATE 50 MCG/ACT NA SUSP: 2.0000 | Freq: Every day | NASAL | 5 refills | Status: DC

## 2018-04-05 NOTE — Assessment & Plan Note (Signed)
Her wheezing is exclusively upper airway in nature, associated with cough with some thick clear mucus.  She denies significant rhinitis/rhinorrhea.  She not having any GERD either.  She did have a laryngoscopy with Dr. Redmond Baseman which was reassuring.  Her pulmonary function testing shows mild obstruction.  Bronchodilator responsiveness was not checked.  Based on her clinical symptoms I think that it would be higher yield for [place] to concentrate on her upper airway irritation.  I think I can temporarily make this better with prednisone (she is improved on this before), but we will need to continue to concentrate on the exacerbating contributors.  I will add a nasal steroid to her Allegra.  I think she can stop Pepcid at this time.  If she continues to have upper airway noise or does not respond to the prednisone that we may need to consider bronchoscopy to visualize her upper airway past the cords.  I suspect that this will be low yield given the absence of any evidence for upper airway obstruction on her spirometry.

## 2018-04-05 NOTE — Progress Notes (Signed)
Subjective:    Patient ID: Cassidy Ellison, female    DOB: 12/12/1921, 84 y.o.   MRN: [id number]  HPI This is a new consult visit for 84 year old woman, former smoker (45 pack years) with a history of hypertension, psoriasis, hyperlipidemia, prior DVT, GERD, restless leg syndrome.  Has been followed for a GG nodule in her RUL, stable on repeat last 06/2016. She was hospitalized in June for hyponatremia, sodium 113.  Based on review of the notes it appears that this was felt to be a hypovolemic hyponatremia.  She referred today for evaluation of wheezing, can be intermittent, no clear precipitants. Sounds like it could be UA in nature - others can hear it. Has been treated with pred before x 1, helped. Was trialed on albuterol in the hospital, unclear that it helped much. The wheeze seemed to increase in the Spring, may have had some increase cough. No real change sneezing, etc. She has been on PPI in the past, not since the Spring. She may be having some breakthrough reflux, even when she was on the PPI.   ROV 04/05/18--follow-up visit for her 84 year old woman with a history of former tobacco use, right upper lobe groundglass nodule that was stable as of 06/2016 but no follow-up since.  I saw her for wheezing and associated dyspnea that sound like he had a component of upper airway noise, was associated in part with cough.  Last time we empirically started Pepcid and Allegra.  She underwent pulmonary function testing today which I reviewed.  This showed spirometry with mild obstruction and some curve to her flow volume loop was consistent with airflow obstruction. Since last time her cough has gotten worse, lasts all day, prod of thick clear. She has intermittent stridor through the day. Denies any GERD sx. She saw Dr Redmond Baseman w ENT > the laryngoscopy was reassuring.    Review of Systems  Constitutional: Negative for fever and unexpected weight change.  HENT: Negative for congestion, dental problem, ear pain,  nosebleeds, postnasal drip, rhinorrhea, sinus pressure, sneezing, sore throat and trouble swallowing.   Eyes: Negative for redness and itching.  Respiratory: Positive for cough and wheezing. Negative for chest tightness and shortness of breath.   Cardiovascular: Negative for palpitations and leg swelling.  Gastrointestinal: Negative for nausea and vomiting.  Genitourinary: Negative for dysuria.  Musculoskeletal: Negative for joint swelling.  Skin: Negative for rash.  Neurological: Negative for headaches.  Hematological: Does not bruise/bleed easily.  Psychiatric/Behavioral: Negative for dysphoric mood. The patient is not nervous/anxious.     Past Medical History:  Diagnosis Date  . Carpal tunnel syndrome    left  . DVT (deep venous thrombosis) ([place])   . GERD (gastroesophageal reflux disease)   . Hyperlipemia   . Hypertension   . RLS (restless legs syndrome)      Family History  Problem Relation Age of Onset  . Heart failure Father   . Heart failure Mother   . Cancer Sister        breast cancer  . Diabetes Brother      Social History   Socioeconomic History  . Marital status: Widowed    Spouse name: Not on file  . Number of children: Not on file  . Years of education: Not on file  . Highest education level: Not on file  Occupational History  . Not on file  Social Needs  . Financial resource strain: Not on file  . Food insecurity:    Worry: Not  on file    Inability: Not on file  . Transportation needs:    Medical: Not on file    Non-medical: Not on file  Tobacco Use  . Smoking status: Former Smoker    Packs/day: 1.50    Years: 30.00    Pack years: 45.00    Types: Cigarettes    Last attempt to quit: 05/10/1990    Years since quitting: 27.9  . Smokeless tobacco: Never Used  . Tobacco comment: quit 20years ago  Substance and Sexual Activity  . Alcohol use: Yes    Comment: social events  . Drug use: No  . Sexual activity: Not on file  Lifestyle  . Physical  activity:    Days per week: Not on file    Minutes per session: Not on file  . Stress: Not on file  Relationships  . Social connections:    Talks on phone: Not on file    Gets together: Not on file    Attends religious service: Not on file    Active member of club or organization: Not on file    Attends meetings of clubs or organizations: Not on file    Relationship status: Not on file  . Intimate partner violence:    Fear of current or ex partner: Not on file    Emotionally abused: Not on file    Physically abused: Not on file    Forced sexual activity: Not on file  Other Topics Concern  . Not on file  Social History Narrative  . Not on file     Allergies  Allergen Reactions  . Adhesive [Tape] Itching  . Chlorthalidone Other (See Comments)    creatinine  increases  . Hydrochlorothiazide Other (See Comments)    CREATININE INCREASES  . Oxsoralen [Methoxsalen]      Outpatient Medications Prior to Visit  Medication Sig Dispense Refill  . amLODipine (NORVASC) 5 MG tablet Take 5 mg by mouth at bedtime.     Marland Kitchen atorvastatin (LIPITOR) 20 MG tablet Take 20 mg by mouth daily.     . betamethasone dipropionate (DIPROLENE) 0.05 % ointment APPLY TO RASH ON BACK AND LEFT THIGH TWICE DAILY  2  . Calcium Carbonate (CALCIUM 600 PO) Take 600 mg by mouth daily.    . Cholecalciferol (VITAMIN D-3) 1000 units CAPS Take 1,000 Units by mouth daily.    . clopidogrel (PLAVIX) 75 MG tablet Take 1 tablet (75 mg total) by mouth daily. 90 tablet 3  . famotidine (PEPCID) 20 MG tablet Take by mouth.    . losartan (COZAAR) 100 MG tablet Take 100 mg by mouth daily.    . metoprolol succinate (TOPROL-XL) 50 MG 24 hr tablet Take 1 tablet (50 mg total) by mouth daily. 30 tablet 3  . Multiple Vitamins-Minerals (MULTIVITAMIN WITH MINERALS) tablet Take 1 tablet by mouth daily.     Marland Kitchen rOPINIRole (REQUIP) 1 MG tablet Take 1 mg by mouth at bedtime.    . sodium chloride 1 g tablet TK 2 TS PO BID WAC  0  . vitamin C  (ASCORBIC ACID) 500 MG tablet Take 500 mg by mouth daily.     No facility-administered medications prior to visit.         Objective:   Physical Exam  Vitals:   04/05/18 0918  BP: 120/70  Pulse: 83  SpO2: 93%  Weight: 145 lb (65.8 kg)  Height: 5\' 2"  (1.575 m)  Gen: Pleasant elderly woman, well-nourished, in no distress,  normal  affect  ENT: No lesions,  mouth clear,  oropharynx clear, no postnasal drip  Neck: No JVD, significant inspiratory and expiratory stridor, gets better when she phonates  Lungs: No use of accessory muscles, no no crackles, no overt wheezing, referred upper airway noise  Cardiovascular: RRR, heart sounds normal, no murmur or gallops, no peripheral edema  Musculoskeletal: No deformities, no cyanosis or clubbing  Neuro: alert, non focal  Skin: Warm, no lesions or rash     Assessment & Plan:  Wheezing Her wheezing is exclusively upper airway in nature, associated with cough with some thick clear mucus.  She denies significant rhinitis/rhinorrhea.  She not having any GERD either.  She did have a laryngoscopy with Dr. Redmond Baseman which was reassuring.  Her pulmonary function testing shows mild obstruction.  Bronchodilator responsiveness was not checked.  Based on her clinical symptoms I think that it would be higher yield for [place] to concentrate on her upper airway irritation.  I think I can temporarily make this better with prednisone (she is improved on this before), but we will need to continue to concentrate on the exacerbating contributors.  I will add a nasal steroid to her Allegra.  I think she can stop Pepcid at this time.  If she continues to have upper airway noise or does not respond to the prednisone that we may need to consider bronchoscopy to visualize her upper airway past the cords.  I suspect that this will be low yield given the absence of any evidence for upper airway obstruction on her spirometry.  Pulmonary nodule At her age not clear to me that  there is benefit to repeating her CT scan.  We have opted for conservative approach for now.  We can revisit going forward  Baltazar Apo, MD, PhD 04/05/2018, 10:06 AM [facility] [phone] or if no answer [phone]

## 2018-04-05 NOTE — Patient Instructions (Signed)
Please take prednisone 30 mg daily for the next 7 days and then stop. Please continue your Allegra once daily. Try starting fluticasone nasal spray, 2 sprays each nostril once daily. You may stop your famotidine (Pepcid) We will not start an inhaled medication at this time. We will not plan to repeat your CT scan of the chest at this time.  We can revisit this in the future if we feel like it would be beneficial. Follow with Dr Lamonte Sakai in 1 month or next available

## 2018-04-05 NOTE — Assessment & Plan Note (Signed)
At her age not clear to me that there is benefit to repeating her CT scan.  We have opted for conservative approach for now.  We can revisit going forward

## 2018-04-10 ENCOUNTER — Inpatient Hospital Stay (HOSPITAL_COMMUNITY)
Admission: EM | Admit: 2018-04-10 | Discharge: 2018-04-12 | DRG: 641 | Disposition: A | Discharge status: Home / Self Care | Payer: Medicare Other | Attending: Internal Medicine | Admitting: Internal Medicine

## 2018-04-10 ENCOUNTER — Other Ambulatory Visit: Payer: Self-pay

## 2018-04-10 ENCOUNTER — Encounter (HOSPITAL_COMMUNITY): Payer: Self-pay | Admitting: *Deleted

## 2018-04-10 ENCOUNTER — Emergency Department (HOSPITAL_COMMUNITY): Payer: Medicare Other

## 2018-04-10 DIAGNOSIS — Z7951 Long term (current) use of inhaled steroids: Secondary | ICD-10-CM

## 2018-04-10 DIAGNOSIS — Z90710 Acquired absence of both cervix and uterus: Secondary | ICD-10-CM

## 2018-04-10 DIAGNOSIS — G9349 Other encephalopathy: Secondary | ICD-10-CM | POA: Diagnosis present

## 2018-04-10 DIAGNOSIS — E861 Hypovolemia: Secondary | ICD-10-CM | POA: Diagnosis present

## 2018-04-10 DIAGNOSIS — E785 Hyperlipidemia, unspecified: Secondary | ICD-10-CM | POA: Diagnosis present

## 2018-04-10 DIAGNOSIS — I10 Essential (primary) hypertension: Secondary | ICD-10-CM | POA: Diagnosis present

## 2018-04-10 DIAGNOSIS — Z86718 Personal history of other venous thrombosis and embolism: Secondary | ICD-10-CM | POA: Diagnosis not present

## 2018-04-10 DIAGNOSIS — Z7952 Long term (current) use of systemic steroids: Secondary | ICD-10-CM

## 2018-04-10 DIAGNOSIS — R531 Weakness: Secondary | ICD-10-CM | POA: Diagnosis not present

## 2018-04-10 DIAGNOSIS — E871 Hypo-osmolality and hyponatremia: Secondary | ICD-10-CM | POA: Diagnosis not present

## 2018-04-10 DIAGNOSIS — J441 Chronic obstructive pulmonary disease with (acute) exacerbation: Secondary | ICD-10-CM | POA: Diagnosis present

## 2018-04-10 DIAGNOSIS — J439 Emphysema, unspecified: Secondary | ICD-10-CM | POA: Diagnosis not present

## 2018-04-10 DIAGNOSIS — Z79899 Other long term (current) drug therapy: Secondary | ICD-10-CM

## 2018-04-10 DIAGNOSIS — Z7902 Long term (current) use of antithrombotics/antiplatelets: Secondary | ICD-10-CM

## 2018-04-10 DIAGNOSIS — Z87891 Personal history of nicotine dependence: Secondary | ICD-10-CM

## 2018-04-10 DIAGNOSIS — G2581 Restless legs syndrome: Secondary | ICD-10-CM | POA: Diagnosis present

## 2018-04-10 DIAGNOSIS — Z66 Do not resuscitate: Secondary | ICD-10-CM | POA: Diagnosis present

## 2018-04-10 DIAGNOSIS — R062 Wheezing: Secondary | ICD-10-CM | POA: Diagnosis present

## 2018-04-10 LAB — CBC WITH DIFFERENTIAL/PLATELET
Abs Immature Granulocytes: 0.02 10*3/uL (ref 0.00–0.07)
BASOS ABS: 0 10*3/uL (ref 0.0–0.1)
BASOS PCT: 0 %
EOS ABS: 0 10*3/uL (ref 0.0–0.5)
Eosinophils Relative: 0 %
HCT: 36.8 % (ref 36.0–46.0)
Hemoglobin: 12.6 g/dL (ref 12.0–15.0)
IMMATURE GRANULOCYTES: 0 %
Lymphocytes Relative: 14 %
Lymphs Abs: 1.1 10*3/uL (ref 0.7–4.0)
MCH: 27.4 pg (ref 26.0–34.0)
MCHC: 34.2 g/dL (ref 30.0–36.0)
MCV: 80 fL (ref 80.0–100.0)
MONOS PCT: 7 %
Monocytes Absolute: 0.5 10*3/uL (ref 0.1–1.0)
NEUTROS ABS: 5.9 10*3/uL (ref 1.7–7.7)
NEUTROS PCT: 79 %
PLATELETS: 216 10*3/uL (ref 150–400)
RBC: 4.6 MIL/uL (ref 3.87–5.11)
RDW: 12.1 % (ref 11.5–15.5)
WBC: 7.6 10*3/uL (ref 4.0–10.5)
nRBC: 0 % (ref 0.0–0.2)

## 2018-04-10 LAB — URINALYSIS, ROUTINE W REFLEX MICROSCOPIC
Bilirubin Urine: NEGATIVE
GLUCOSE, UA: NEGATIVE mg/dL
Hgb urine dipstick: NEGATIVE
KETONES UR: NEGATIVE mg/dL
LEUKOCYTES UA: NEGATIVE
NITRITE: NEGATIVE
PROTEIN: NEGATIVE mg/dL
Specific Gravity, Urine: 1.008 (ref 1.005–1.030)
pH: 7 (ref 5.0–8.0)

## 2018-04-10 LAB — BASIC METABOLIC PANEL
ANION GAP: 10 (ref 5–15)
BUN: 22 mg/dL (ref 8–23)
CALCIUM: 9 mg/dL (ref 8.9–10.3)
CO2: 23 mmol/L (ref 22–32)
Chloride: 87 mmol/L — ABNORMAL LOW (ref 98–111)
Creatinine, Ser: 0.72 mg/dL (ref 0.44–1.00)
GLUCOSE: 107 mg/dL — AB (ref 70–99)
POTASSIUM: 4.2 mmol/L (ref 3.5–5.1)
SODIUM: 120 mmol/L — AB (ref 135–145)

## 2018-04-10 MED ORDER — SODIUM CHLORIDE 0.9 % IV BOLUS
500.0000 mL | Freq: Once | INTRAVENOUS | Status: AC
  Administered 2018-04-10: 500 mL via INTRAVENOUS

## 2018-04-10 NOTE — ED Provider Notes (Signed)
[facility] DEPT Provider Note   CSN: [id number] Arrival date & time: 04/10/18  1904     History   Chief Complaint Chief Complaint  Patient presents with  . low sodium    HPI Cassidy Ellison is a 84 y.o. female.  Level 5 caveat for inability to give full history.  Generalized weakness for 2 weeks.  Patient seen by her primary care doctor today and her sodium was 116.  She was sent to the emergency department for further treatment.  This has happened a couple times in the past.  No one has ever discovered an etiology.  She went to her pulmonologist last week for coughing and wheezing and was rxed Flonase and prednisone.  She has taken "salt tablets" in the past for hyponatremia.     Past Medical History:  Diagnosis Date  . Carpal tunnel syndrome    left  . DVT (deep venous thrombosis) ([place])   . GERD (gastroesophageal reflux disease)   . Hyperlipemia   . Hypertension   . RLS (restless legs syndrome)     Patient Active Problem List   Diagnosis Date Noted  . Wheezing 02/09/2018  . Pulmonary nodule 02/09/2018  . Palpitations 08/12/2015  . HTN (hypertension) 08/12/2015  . Acute confusional state 08/12/2015  . Cerebrovascular accident (CVA) ([place])   . Hyperlipidemia   . TIA (transient ischemic attack) 06/13/2015  . Influenza with pneumonia 05/17/2015  . Essential hypertension 05/15/2015  . GERD (gastroesophageal reflux disease) 05/15/2015  . Cough 05/15/2015  . Complicated migraine 83/41/9622  . RLS (restless legs syndrome)   . Hyponatremia   . Weakness   . Dehydration 05/11/2015  . UTI (lower urinary tract infection) 05/11/2015  . Acute bronchitis with bronchospasm 05/11/2015    Past Surgical History:  Procedure Laterality Date  . ABDOMINAL HYSTERECTOMY    . CARPAL TUNNEL RELEASE Left 10/31/2014   Procedure: LEFT CARPAL TUNNEL RELEASE;  Surgeon: Leanora Cover, MD;  Location: [facility];  Service: Orthopedics;  Laterality:  Left;  . CARPAL TUNNEL RELEASE Right 02/12/2016   Procedure: CARPAL TUNNEL RELEASE, right;  Surgeon: Leanora Cover, MD;  Location: [facility];  Service: Orthopedics;  Laterality: Right;  . GALLBLADDER SURGERY       OB History   None      Home Medications    Prior to Admission medications   Medication Sig Start Date End Date Taking? Authorizing Provider  amLODipine (NORVASC) 5 MG tablet Take 5 mg by mouth daily.  08/19/17  Yes [provider]  atorvastatin (LIPITOR) 20 MG tablet Take 20 mg by mouth daily.  07/28/17  Yes [provider]  Calcium Carbonate (CALCIUM 600 PO) Take 600 mg by mouth daily.   Yes [provider]  Cholecalciferol (VITAMIN D-3) 1000 units CAPS Take 1,000 Units by mouth daily.   Yes [provider]  clopidogrel (PLAVIX) 75 MG tablet Take 1 tablet (75 mg total) by mouth daily. 09/05/17  Yes Rosalin Hawking, MD  fexofenadine (ALLEGRA) 180 MG tablet Take 180 mg by mouth daily.   Yes [provider]  fluticasone (FLONASE) 50 MCG/ACT nasal spray Place 2 sprays into both nostrils daily. 04/05/18  Yes Collene Gobble, MD  losartan (COZAAR) 100 MG tablet Take 100 mg by mouth daily.   Yes [provider]  metoprolol succinate (TOPROL-XL) 50 MG 24 hr tablet Take 1 tablet (50 mg total) by mouth daily. 08/12/15  Yes Rosalin Hawking, MD  Multiple Vitamins-Minerals (  MULTIVITAMIN WITH MINERALS) tablet Take 1 tablet by mouth daily.    Yes [provider]  predniSONE (DELTASONE) 10 MG tablet Take 3 tablets (30 mg total) by mouth daily with breakfast. 04/05/18  Yes Byrum, Rose Fillers, MD  rOPINIRole (REQUIP) 1 MG tablet Take 1 mg by mouth at bedtime.   Yes [provider]  vitamin C (ASCORBIC ACID) 500 MG tablet Take 500 mg by mouth daily.   Yes [provider]  betamethasone dipropionate (DIPROLENE) 0.05 % ointment Apply 1 application topically 2 (two) times daily.  11/21/17   [provider]     Family History Family History  Problem Relation Age of Onset  . Heart failure Father   . Heart failure Mother   . Cancer Sister        breast cancer  . Diabetes Brother     Social History Social History   Tobacco Use  . Smoking status: Former Smoker    Packs/day: 1.50    Years: 30.00    Pack years: 45.00    Types: Cigarettes    Last attempt to quit: 05/10/1990    Years since quitting: 27.9  . Smokeless tobacco: Never Used  . Tobacco comment: quit 20years ago  Substance Use Topics  . Alcohol use: Yes    Comment: social events  . Drug use: No     Allergies   Adhesive [tape]; Chlorthalidone; Hydrochlorothiazide; and Oxsoralen [methoxsalen]   Review of Systems Review of Systems  Unable to perform ROS: Other (Daughter provided history.)     Physical Exam Updated Vital Signs BP (!) 162/81 (BP Location: Left Arm)   Pulse 72   Temp 98.2 F (36.8 C) (Oral)   Resp 16   Ht 5\' 2"  (1.575 m)   Wt 65.8 kg   SpO2 97%   BMI 26.52 kg/m   Physical Exam  Constitutional: She is oriented to person, place, and time. She appears well-developed and well-nourished.  nad  HENT:  Head: Normocephalic and atraumatic.  Eyes: Conjunctivae are normal.  Neck: Neck supple.  Cardiovascular: Normal rate and regular rhythm.  Pulmonary/Chest: Effort normal and breath sounds normal.  Abdominal: Soft. Bowel sounds are normal.  Musculoskeletal: Normal range of motion.  Neurological: She is alert and oriented to person, place, and time.  Skin: Skin is warm and dry.  Psychiatric: She has a normal mood and affect. Her behavior is normal.  Nursing note and vitals reviewed.    ED Treatments / Results  Labs (all labs ordered are listed, but only abnormal results are displayed) Labs Reviewed  BASIC METABOLIC PANEL - Abnormal; Notable for the following components:      Result Value   Sodium 120 (*)    Chloride 87 (*)    Glucose, Bld 107 (*)    All other components within normal  limits  URINALYSIS, ROUTINE W REFLEX MICROSCOPIC - Abnormal; Notable for the following components:   Color, Urine STRAW (*)    All other components within normal limits  CBC WITH DIFFERENTIAL/PLATELET    EKG None  Radiology No results found.  Procedures Procedures (including critical care time)  Medications Ordered in ED Medications  sodium chloride 0.9 % bolus 500 mL (500 mLs Intravenous New Bag/Given 04/10/18 2043)     Initial Impression / Assessment and Plan / ED Course  I have reviewed the triage vital signs and the nursing notes.  Pertinent labs & imaging results that were available during my care of the patient were reviewed by  me and considered in my medical decision making (see chart for details).     Patient presents with generalized weakness for 2 weeks.  Sodium 120.  Will admit for further evaluation.  Final Clinical Impressions(s) / ED Diagnoses   Final diagnoses:  Hyponatremia    ED Discharge Orders    None       Nat Christen, MD 04/10/18 2138

## 2018-04-10 NOTE — ED Notes (Signed)
EKG given to EDP,Yelverton,MD., for review.

## 2018-04-10 NOTE — ED Triage Notes (Signed)
Pt seen at PCP today, labs drawn, Sodium 116, weakness x 2 weeks, hallucination episode yesterday.

## 2018-04-10 NOTE — ED Notes (Signed)
ED TO INPATIENT HANDOFF REPORT  Name/Age/Gender Cassidy Ellison 84 y.o. female  Code Status Code Status History    Date Active Date Inactive Code Status Order ID Comments User Context   11/01/2017 2111 11/05/2017 1436 DNR [id number]  Etta Quill, DO ED   06/13/2015 2310 06/14/2015 1751 DNR [id number]  Toy Baker, MD Inpatient   05/15/2015 2010 05/24/2015 1526 Full Code [id number]  Ivor Costa, MD ED   05/11/2015 2035 05/13/2015 1755 Full Code [id number]  Reubin Milan, MD Inpatient    Questions for Most Recent Historical Code Status (Order [id number])    Question Answer Comment   In the event of cardiac or respiratory ARREST Do not call a "code blue"    In the event of cardiac or respiratory ARREST Do not perform Intubation, CPR, defibrillation or ACLS    In the event of cardiac or respiratory ARREST Use medication by any route, position, wound care, and other measures to relive pain and suffering. May use oxygen, suction and manual treatment of airway obstruction as needed for comfort.         Advance Directive Documentation     Most Recent Value  Type of Advance Directive  Living will  Pre-existing out of facility DNR order (yellow form or pink MOST form)  -  "MOST" Form in Place?  -      Home/SNF/Other Home  Chief Complaint Low sodium   Level of Care/Admitting Diagnosis ED Disposition    ED Disposition Condition [place]: [facility] [100102]  Level of Care: Telemetry [5]  Admit to tele based on following criteria: Monitor QTC interval  Diagnosis: Hyponatremia [998338]  Admitting Physician: Rise Patience [phone]  Attending Physician: Rise Patience [[id number]]  PT Class (Do Not Modify): Observation [104]  PT Acc Code (Do Not Modify): Observation [10022]       Medical History Past Medical History:  Diagnosis Date  . Carpal tunnel syndrome    left  . DVT (deep venous thrombosis) ([place])   . GERD (gastroesophageal  reflux disease)   . Hyperlipemia   . Hypertension   . RLS (restless legs syndrome)     Allergies Allergies  Allergen Reactions  . Adhesive [Tape] Itching  . Chlorthalidone Other (See Comments)    creatinine  increases  . Hydrochlorothiazide Other (See Comments)    CREATININE INCREASES  . Oxsoralen [Methoxsalen]     IV Location/Drains/Wounds Patient Lines/Drains/Airways Status   Active Line/Drains/Airways    Name:   Placement date:   Placement time:   Site:   Days:   Peripheral IV 04/10/18 Right Antecubital   04/10/18    2032    Antecubital   less than 1   Incision (Closed) 02/12/16 Arm Right   02/12/16    1524     788          Labs/Imaging Results for orders placed or performed during the hospital encounter of 04/10/18 (from the past 48 hour(s))  CBC with Differential     Status: None   Collection Time: 04/10/18  8:43 PM  Result Value Ref Range   WBC 7.6 4.0 - 10.5 K/uL   RBC 4.60 3.87 - 5.11 MIL/uL   Hemoglobin 12.6 12.0 - 15.0 g/dL   HCT 36.8 36.0 - 46.0 %   MCV 80.0 80.0 - 100.0 fL   MCH 27.4 26.0 - 34.0 pg   MCHC 34.2 30.0 - 36.0 g/dL   RDW 12.1 11.5 -  15.5 %   Platelets 216 150 - 400 K/uL   nRBC 0.0 0.0 - 0.2 %   Neutrophils Relative % 79 %   Neutro Abs 5.9 1.7 - 7.7 K/uL   Lymphocytes Relative 14 %   Lymphs Abs 1.1 0.7 - 4.0 K/uL   Monocytes Relative 7 %   Monocytes Absolute 0.5 0.1 - 1.0 K/uL   Eosinophils Relative 0 %   Eosinophils Absolute 0.0 0.0 - 0.5 K/uL   Basophils Relative 0 %   Basophils Absolute 0.0 0.0 - 0.1 K/uL   Immature Granulocytes 0 %   Abs Immature Granulocytes 0.02 0.00 - 0.07 K/uL    Comment: Performed at [facility], [place] [address] [place], [place] [address]  Basic metabolic panel     Status: Abnormal   Collection Time: 04/10/18  8:43 PM  Result Value Ref Range   Sodium 120 (L) 135 - 145 mmol/L   Potassium 4.2 3.5 - 5.1 mmol/L   Chloride 87 (L) 98 - 111 mmol/L   CO2 23 22 - 32 mmol/L   Glucose, Bld 107 (H)  70 - 99 mg/dL   BUN 22 8 - 23 mg/dL   Creatinine, Ser 0.72 0.44 - 1.00 mg/dL   Calcium 9.0 8.9 - 10.3 mg/dL   GFR calc non Af Amer >60 >60 mL/min   GFR calc Af Amer >60 >60 mL/min   Anion gap 10 5 - 15    Comment: Performed at [facility], [place] [address], [place], [place] [address]  Urinalysis, Routine w reflex microscopic     Status: Abnormal   Collection Time: 04/10/18  8:43 PM  Result Value Ref Range   Color, Urine STRAW (A) YELLOW   APPearance CLEAR CLEAR   Specific Gravity, Urine 1.008 1.005 - 1.030   pH 7.0 5.0 - 8.0   Glucose, UA NEGATIVE NEGATIVE mg/dL   Hgb urine dipstick NEGATIVE NEGATIVE   Bilirubin Urine NEGATIVE NEGATIVE   Ketones, ur NEGATIVE NEGATIVE mg/dL   Protein, ur NEGATIVE NEGATIVE mg/dL   Nitrite NEGATIVE NEGATIVE   Leukocytes, UA NEGATIVE NEGATIVE    Comment: Performed at [facility] [address] [place], [place] [address]   Dg Chest 2 View  Result Date: 04/10/2018 CLINICAL DATA:  Generalized weakness for 2 weeks.  Hyponatremic. EXAM: CHEST - 2 VIEW COMPARISON:  Chest radiograph November 03, 2017 FINDINGS: Cardiac silhouette is normal in size. Mildly tortuous calcified aorta associated chronic hypertension. Similar chronic interstitial changes and increased lung volumes without pleural effusion or focal consolidation. LEFT lung base density could reflect anterior rib or nipple shadow. No pneumothorax. Old RIGHT rib fracture versus exostosis. Advanced degenerative change of the thoracic spine. IMPRESSION: 1. No acute cardiopulmonary process. 2.  Emphysema (ICD10-J43.9). 3.  Aortic Atherosclerosis (ICD10-I70.0). Electronically Signed   By: Elon Alas M.D.   On: 04/10/2018 22:06   None  Pending Labs Unresulted Labs (From admission, onward)   None      Vitals/Pain Today's Vitals   04/10/18 1937 04/10/18 2121 04/10/18 2130  BP: 127/79 (!) 162/81 (!) 150/74  Pulse: 76 72 72  Resp: 14 16   Temp: 98.3 F (36.8 C)  98.2 F (36.8 C)   TempSrc: Oral Oral   SpO2: 97% 97% 96%  Weight: 65.8 kg    Height: 5\' 2"  (1.575 m)    PainSc: 0-No pain      Isolation Precautions No active isolations  Medications Medications  sodium chloride 0.9 % bolus  500 mL (0 mLs Intravenous Stopped 04/10/18 2200)    Mobility walks with device

## 2018-04-11 ENCOUNTER — Encounter (HOSPITAL_COMMUNITY): Payer: Self-pay | Admitting: Internal Medicine

## 2018-04-11 DIAGNOSIS — Z86718 Personal history of other venous thrombosis and embolism: Secondary | ICD-10-CM | POA: Diagnosis not present

## 2018-04-11 DIAGNOSIS — Z7952 Long term (current) use of systemic steroids: Secondary | ICD-10-CM | POA: Diagnosis not present

## 2018-04-11 DIAGNOSIS — Z79899 Other long term (current) drug therapy: Secondary | ICD-10-CM | POA: Diagnosis not present

## 2018-04-11 DIAGNOSIS — J441 Chronic obstructive pulmonary disease with (acute) exacerbation: Secondary | ICD-10-CM | POA: Diagnosis present

## 2018-04-11 DIAGNOSIS — Z66 Do not resuscitate: Secondary | ICD-10-CM | POA: Diagnosis present

## 2018-04-11 DIAGNOSIS — E785 Hyperlipidemia, unspecified: Secondary | ICD-10-CM | POA: Diagnosis present

## 2018-04-11 DIAGNOSIS — I10 Essential (primary) hypertension: Secondary | ICD-10-CM

## 2018-04-11 DIAGNOSIS — Z7951 Long term (current) use of inhaled steroids: Secondary | ICD-10-CM | POA: Diagnosis not present

## 2018-04-11 DIAGNOSIS — Z90710 Acquired absence of both cervix and uterus: Secondary | ICD-10-CM | POA: Diagnosis not present

## 2018-04-11 DIAGNOSIS — Z87891 Personal history of nicotine dependence: Secondary | ICD-10-CM | POA: Diagnosis not present

## 2018-04-11 DIAGNOSIS — E871 Hypo-osmolality and hyponatremia: Principal | ICD-10-CM

## 2018-04-11 DIAGNOSIS — G9349 Other encephalopathy: Secondary | ICD-10-CM | POA: Diagnosis present

## 2018-04-11 DIAGNOSIS — Z7902 Long term (current) use of antithrombotics/antiplatelets: Secondary | ICD-10-CM | POA: Diagnosis not present

## 2018-04-11 DIAGNOSIS — E861 Hypovolemia: Secondary | ICD-10-CM | POA: Diagnosis present

## 2018-04-11 DIAGNOSIS — G2581 Restless legs syndrome: Secondary | ICD-10-CM | POA: Diagnosis present

## 2018-04-11 LAB — BASIC METABOLIC PANEL
Anion gap: 9 (ref 5–15)
Anion gap: 9 (ref 5–15)
Anion gap: 6 (ref 5–15)
Anion gap: 9 (ref 5–15)
BUN: 15 mg/dL (ref 8–23)
BUN: 16 mg/dL (ref 8–23)
BUN: 17 mg/dL (ref 8–23)
BUN: 17 mg/dL (ref 8–23)
CO2: 24 mmol/L (ref 22–32)
CO2: 25 mmol/L (ref 22–32)
CO2: 28 mmol/L (ref 22–32)
CO2: 25 mmol/L (ref 22–32)
Calcium: 8.9 mg/dL (ref 8.9–10.3)
Calcium: 8.7 mg/dL — ABNORMAL LOW (ref 8.9–10.3)
Calcium: 8.8 mg/dL — ABNORMAL LOW (ref 8.9–10.3)
Calcium: 8.8 mg/dL — ABNORMAL LOW (ref 8.9–10.3)
Chloride: 92 mmol/L — ABNORMAL LOW (ref 98–111)
Chloride: 92 mmol/L — ABNORMAL LOW (ref 98–111)
Chloride: 92 mmol/L — ABNORMAL LOW (ref 98–111)
Chloride: 89 mmol/L — ABNORMAL LOW (ref 98–111)
Creatinine, Ser: 0.69 mg/dL (ref 0.44–1.00)
Creatinine, Ser: 0.7 mg/dL (ref 0.44–1.00)
Creatinine, Ser: 0.61 mg/dL (ref 0.44–1.00)
Creatinine, Ser: 0.67 mg/dL (ref 0.44–1.00)
GFR calc Af Amer: >60 mL/min (ref >60)
GFR calc Af Amer: >60 mL/min (ref >60)
GFR calc Af Amer: >60 mL/min (ref >60)
GFR calc Af Amer: >60 mL/min (ref >60)
GFR calc non Af Amer: >60 mL/min (ref >60)
GFR calc non Af Amer: >60 mL/min (ref >60)
GFR calc non Af Amer: >60 mL/min (ref >60)
GFR calc non Af Amer: >60 mL/min (ref >60)
GLUCOSE: 103 mg/dL — AB (ref 70–99)
Glucose, Bld: 104 mg/dL — ABNORMAL HIGH (ref 70–99)
Glucose, Bld: 94 mg/dL (ref 70–99)
Glucose, Bld: 101 mg/dL — ABNORMAL HIGH (ref 70–99)
POTASSIUM: 3.6 mmol/L (ref 3.5–5.1)
POTASSIUM: 3.8 mmol/L (ref 3.5–5.1)
Potassium: 3.7 mmol/L (ref 3.5–5.1)
Potassium: 3.4 mmol/L — ABNORMAL LOW (ref 3.5–5.1)
Sodium: 125 mmol/L — ABNORMAL LOW (ref 135–145)
Sodium: 126 mmol/L — ABNORMAL LOW (ref 135–145)
Sodium: 126 mmol/L — ABNORMAL LOW (ref 135–145)
Sodium: 123 mmol/L — ABNORMAL LOW (ref 135–145)

## 2018-04-11 LAB — CBC
HCT: 36.6 % (ref 36.0–46.0)
HCT: 34.8 % — ABNORMAL LOW (ref 36.0–46.0)
Hemoglobin: 12.4 g/dL (ref 12.0–15.0)
Hemoglobin: 11.7 g/dL — ABNORMAL LOW (ref 12.0–15.0)
MCH: 28.1 pg (ref 26.0–34.0)
MCH: 28.1 pg (ref 26.0–34.0)
MCHC: 33.9 g/dL (ref 30.0–36.0)
MCHC: 33.6 g/dL (ref 30.0–36.0)
MCV: 82.8 fL (ref 80.0–100.0)
MCV: 83.5 fL (ref 80.0–100.0)
Platelets: 196 10*3/uL (ref 150–400)
Platelets: 183 10*3/uL (ref 150–400)
RBC: 4.42 MIL/uL (ref 3.87–5.11)
RBC: 4.17 MIL/uL (ref 3.87–5.11)
RDW: 12.2 % (ref 11.5–15.5)
RDW: 12.3 % (ref 11.5–15.5)
WBC: 7.7 10*3/uL (ref 4.0–10.5)
WBC: 9.6 10*3/uL (ref 4.0–10.5)
nRBC: 0 % (ref 0.0–0.2)
nRBC: 0 % (ref 0.0–0.2)

## 2018-04-11 LAB — SODIUM, URINE, RANDOM: Sodium, Ur: 51 mmol/L

## 2018-04-11 LAB — MRSA PCR SCREENING: MRSA BY PCR: NEGATIVE

## 2018-04-11 LAB — OSMOLALITY, URINE: Osmolality, Ur: 319 mOsm/kg (ref 300–900)

## 2018-04-11 LAB — TSH: TSH: 1.261 u[IU]/mL (ref 0.350–4.500)

## 2018-04-11 LAB — OSMOLALITY: Osmolality: 264 mOsm/kg — ABNORMAL LOW (ref 275–295)

## 2018-04-11 MED ORDER — AMLODIPINE BESYLATE 5 MG PO TABS
5.0000 mg | ORAL_TABLET | Freq: Every day | ORAL | Status: DC
  Administered 2018-04-11 – 2018-04-12 (×2): 5 mg via ORAL
  Filled 2018-04-11 (×2): qty 1

## 2018-04-11 MED ORDER — ADULT MULTIVITAMIN W/MINERALS CH
1.0000 | ORAL_TABLET | Freq: Every day | ORAL | Status: DC
  Administered 2018-04-11 – 2018-04-12 (×2): 1 via ORAL
  Filled 2018-04-11 (×2): qty 1

## 2018-04-11 MED ORDER — LOSARTAN POTASSIUM 50 MG PO TABS
100.0000 mg | ORAL_TABLET | Freq: Every day | ORAL | Status: DC
  Administered 2018-04-11 – 2018-04-12 (×2): 100 mg via ORAL
  Filled 2018-04-11 (×2): qty 2

## 2018-04-11 MED ORDER — ENOXAPARIN SODIUM 40 MG/0.4ML ~~LOC~~ SOLN
40.0000 mg | Freq: Every day | SUBCUTANEOUS | Status: DC
  Administered 2018-04-11 (×2): 40 mg via SUBCUTANEOUS
  Filled 2018-04-11 (×2): qty 0.4

## 2018-04-11 MED ORDER — ROPINIROLE HCL 1 MG PO TABS
1.0000 mg | ORAL_TABLET | Freq: Every day | ORAL | Status: DC
  Administered 2018-04-11 (×2): 1 mg via ORAL
  Filled 2018-04-11 (×2): qty 1

## 2018-04-11 MED ORDER — FLUTICASONE PROPIONATE 50 MCG/ACT NA SUSP
2.0000 | Freq: Every day | NASAL | Status: DC
  Administered 2018-04-11 – 2018-04-12 (×2): 2 via NASAL
  Filled 2018-04-11: qty 16

## 2018-04-11 MED ORDER — LORATADINE 10 MG PO TABS
10.0000 mg | ORAL_TABLET | Freq: Every day | ORAL | Status: DC
  Administered 2018-04-11 – 2018-04-12 (×2): 10 mg via ORAL
  Filled 2018-04-11 (×2): qty 1

## 2018-04-11 MED ORDER — POLYETHYLENE GLYCOL 3350 17 G PO PACK
17.0000 g | PACK | Freq: Every day | ORAL | Status: DC
  Administered 2018-04-11 – 2018-04-12 (×2): 17 g via ORAL
  Filled 2018-04-11 (×2): qty 1

## 2018-04-11 MED ORDER — ACETAMINOPHEN 650 MG RE SUPP: 650.0000 mg | Freq: Four times a day (QID) | RECTAL | Status: DC | PRN

## 2018-04-11 MED ORDER — METOPROLOL SUCCINATE ER 50 MG PO TB24
50.0000 mg | ORAL_TABLET | Freq: Every day | ORAL | Status: DC
  Administered 2018-04-11 – 2018-04-12 (×2): 50 mg via ORAL
  Filled 2018-04-11 (×2): qty 1

## 2018-04-11 MED ORDER — FUROSEMIDE 20 MG PO TABS
20.0000 mg | ORAL_TABLET | Freq: Once | ORAL | Status: AC
  Administered 2018-04-11: 20 mg via ORAL
  Filled 2018-04-11: qty 1

## 2018-04-11 MED ORDER — ACETAMINOPHEN 325 MG PO TABS: 650.0000 mg | ORAL_TABLET | Freq: Four times a day (QID) | ORAL | Status: DC | PRN

## 2018-04-11 MED ORDER — ATORVASTATIN CALCIUM 20 MG PO TABS
20.0000 mg | ORAL_TABLET | Freq: Every day | ORAL | Status: DC
  Administered 2018-04-11 – 2018-04-12 (×2): 20 mg via ORAL
  Filled 2018-04-11 (×2): qty 1

## 2018-04-11 MED ORDER — VITAMIN D 25 MCG (1000 UNIT) PO TABS
1000.0000 [IU] | ORAL_TABLET | Freq: Every day | ORAL | Status: DC
  Administered 2018-04-11 – 2018-04-12 (×2): 1000 [IU] via ORAL
  Filled 2018-04-11 (×2): qty 1

## 2018-04-11 MED ORDER — CALCIUM CARBONATE 1250 (500 CA) MG PO TABS
1250.0000 mg | ORAL_TABLET | Freq: Every day | ORAL | Status: DC
  Administered 2018-04-11 – 2018-04-12 (×2): 1250 mg via ORAL
  Filled 2018-04-11 (×2): qty 1

## 2018-04-11 MED ORDER — SODIUM CHLORIDE 0.9 % IV SOLN: INTRAVENOUS | Status: DC

## 2018-04-11 MED ORDER — CLOPIDOGREL BISULFATE 75 MG PO TABS
75.0000 mg | ORAL_TABLET | Freq: Every day | ORAL | Status: DC
  Administered 2018-04-11 – 2018-04-12 (×2): 75 mg via ORAL
  Filled 2018-04-11 (×2): qty 1

## 2018-04-11 NOTE — Care Management Obs Status (Signed)
[facility] NOTIFICATION   Patient Details  Name: ANUHEA GASSNER MRN: [id number] Date of Birth: 1921/12/14   Medicare Observation Status Notification Given:  Yes    Purcell Mouton, RN 04/11/2018, 12:58 PM

## 2018-04-11 NOTE — Progress Notes (Signed)
Patient seen and examined at bedside, patient admitted after midnight, please see earlier detailed admission note by Rise Patience, MD. Briefly, patient presented with hyponatremia. Initial treatment of IV NS has improved sodium however, appears to be reaching plateau. Patient is euvolemic on exam. May need to discontinue IV fluids and fluid restrict depending on upcoming BMP. Will obtain nephrology consult.   Cordelia Poche, MD [facility] 04/11/2018, 12:03 PM

## 2018-04-11 NOTE — H&P (Signed)
History and Physical    Cassidy Ellison LNL:[id number] DOB: 17-Dec-1921 DOA: 04/10/2018  PCP: Harlan Stains, MD  Patient coming from: [facility] living facility.  Chief Complaint: Low sodium.  HPI: Cassidy Ellison is a 84 y.o. female with history of hypertension, hyperlipidemia and has been recently been worked up for wheezing was recently placed on prednisone for wheezing by patient's pulmonologist and has been tapered off yesterday was found to be mildly confused by patient's family.  Since patient was admitted in June with similar complaints at that time patient was found to have hyponatremia patient's family called the patient's primary care physician and had labs done as outpatient.  Which showed sodium of 116 and was instructed to come to the ER.  Patient states she has been drinking more than usual fluid.  Denies any chest pain shortness of breath nausea vomiting or diarrhea.  ED Course: In the ER patient sodium was 120.  Patient appears alert awake oriented to time place and person.  Chest x-ray and EKG unremarkable.  Was given fluid bolus of 100 cc normal saline.  Review of Systems: As per HPI, rest all negative.   Past Medical History:  Diagnosis Date  . Carpal tunnel syndrome    left  . DVT (deep venous thrombosis) ([place])   . GERD (gastroesophageal reflux disease)   . Hyperlipemia   . Hypertension   . RLS (restless legs syndrome)     Past Surgical History:  Procedure Laterality Date  . ABDOMINAL HYSTERECTOMY    . CARPAL TUNNEL RELEASE Left 10/31/2014   Procedure: LEFT CARPAL TUNNEL RELEASE;  Surgeon: Leanora Cover, MD;  Location: [facility];  Service: Orthopedics;  Laterality: Left;  . CARPAL TUNNEL RELEASE Right 02/12/2016   Procedure: CARPAL TUNNEL RELEASE, right;  Surgeon: Leanora Cover, MD;  Location: [facility];  Service: Orthopedics;  Laterality: Right;  . GALLBLADDER SURGERY       reports that she quit smoking about 27 years ago. Her smoking  use included cigarettes. She has a 45.00 pack-year smoking history. She has never used smokeless tobacco. She reports that she drinks alcohol. She reports that she does not use drugs.  Allergies  Allergen Reactions  . Adhesive [Tape] Itching  . Chlorthalidone Other (See Comments)    creatinine  increases  . Hydrochlorothiazide Other (See Comments)    CREATININE INCREASES  . Oxsoralen [Methoxsalen]     Family History  Problem Relation Age of Onset  . Heart failure Father   . Heart failure Mother   . Cancer Sister        breast cancer  . Diabetes Brother     Prior to Admission medications   Medication Sig Start Date End Date Taking? Authorizing Provider  amLODipine (NORVASC) 5 MG tablet Take 5 mg by mouth daily.  08/19/17  Yes [provider]  atorvastatin (LIPITOR) 20 MG tablet Take 20 mg by mouth daily.  07/28/17  Yes [provider]  Calcium Carbonate (CALCIUM 600 PO) Take 600 mg by mouth daily.   Yes [provider]  Cholecalciferol (VITAMIN D-3) 1000 units CAPS Take 1,000 Units by mouth daily.   Yes [provider]  clopidogrel (PLAVIX) 75 MG tablet Take 1 tablet (75 mg total) by mouth daily. 09/05/17  Yes Rosalin Hawking, MD  fexofenadine (ALLEGRA) 180 MG tablet Take 180 mg by mouth daily.   Yes [provider]  fluticasone (FLONASE) 50 MCG/ACT nasal spray Place 2 sprays into both nostrils daily.  04/05/18  Yes Collene Gobble, MD  losartan (COZAAR) 100 MG tablet Take 100 mg by mouth daily.   Yes [provider]  metoprolol succinate (TOPROL-XL) 50 MG 24 hr tablet Take 1 tablet (50 mg total) by mouth daily. 08/12/15  Yes Rosalin Hawking, MD  Multiple Vitamins-Minerals (MULTIVITAMIN WITH MINERALS) tablet Take 1 tablet by mouth daily.    Yes [provider]  predniSONE (DELTASONE) 10 MG tablet Take 3 tablets (30 mg total) by mouth daily with breakfast. 04/05/18  Yes Byrum, Rose Fillers, MD  rOPINIRole (REQUIP) 1 MG tablet Take 1 mg by  mouth at bedtime.   Yes [provider]  vitamin C (ASCORBIC ACID) 500 MG tablet Take 500 mg by mouth daily.   Yes [provider]  betamethasone dipropionate (DIPROLENE) 0.05 % ointment Apply 1 application topically 2 (two) times daily.  11/21/17   [provider]    Physical Exam: Vitals:   04/10/18 1937 04/10/18 2121 04/10/18 2130 04/10/18 2308  BP: 127/79 (!) 162/81 (!) 150/74 136/82  Pulse: 76 72 72 71  Resp: 14 16  20   Temp: 98.3 F (36.8 C) 98.2 F (36.8 C)  (!) 97.5 F (36.4 C)  TempSrc: Oral Oral  Oral  SpO2: 97% 97% 96% 96%  Weight: 65.8 kg   64.7 kg  Height: 5\' 2"  (1.575 m)   5\' 2"  (1.575 m)      Constitutional: Moderately built and nourished. Vitals:   04/10/18 1937 04/10/18 2121 04/10/18 2130 04/10/18 2308  BP: 127/79 (!) 162/81 (!) 150/74 136/82  Pulse: 76 72 72 71  Resp: 14 16  20   Temp: 98.3 F (36.8 C) 98.2 F (36.8 C)  (!) 97.5 F (36.4 C)  TempSrc: Oral Oral  Oral  SpO2: 97% 97% 96% 96%  Weight: 65.8 kg   64.7 kg  Height: 5\' 2"  (1.575 m)   5\' 2"  (1.575 m)   Eyes: Anicteric no pallor. ENMT: No discharge from the ears eyes nose or mouth. Neck: No mass or.  No neck rigidity. Respiratory: Bilateral expiratory wheeze heard. Cardiovascular: S1-S2 heard. Abdomen: Soft nontender bowel sounds present. Musculoskeletal: No edema.  No joint effusion. Skin: No rash. Neurologic: Alert awake oriented to time place and person.  Moves all extremities. Psychiatric: Appears normal.  Normal affect.   Labs on Admission: I have personally reviewed following labs and imaging studies  CBC: Recent Labs  Lab 04/10/18 2043  WBC 7.6  NEUTROABS 5.9  HGB 12.6  HCT 36.8  MCV 80.0  PLT 793   Basic Metabolic Panel: Recent Labs  Lab 04/10/18 2043  NA 120*  K 4.2  CL 87*  CO2 23  GLUCOSE 107*  BUN 22  CREATININE 0.72  CALCIUM 9.0   GFR: Estimated Creatinine Clearance: 37.9 mL/min (by C-G formula based on SCr of 0.72 mg/dL). Liver  Function Tests: No results for input(s): AST, ALT, ALKPHOS, BILITOT, PROT, ALBUMIN in the last 168 hours. No results for input(s): LIPASE, AMYLASE in the last 168 hours. No results for input(s): AMMONIA in the last 168 hours. Coagulation Profile: No results for input(s): INR, PROTIME in the last 168 hours. Cardiac Enzymes: No results for input(s): CKTOTAL, CKMB, CKMBINDEX, TROPONINI in the last 168 hours. BNP (last 3 results) No results for input(s): PROBNP in the last 8760 hours. HbA1C: No results for input(s): HGBA1C in the last 72 hours. CBG: No results for input(s): GLUCAP in the last 168 hours. Lipid Profile: No results for input(s): CHOL, HDL, LDLCALC,  TRIG, CHOLHDL, LDLDIRECT in the last 72 hours. Thyroid Function Tests: No results for input(s): TSH, T4TOTAL, FREET4, T3FREE, THYROIDAB in the last 72 hours. Anemia Panel: No results for input(s): VITAMINB12, FOLATE, FERRITIN, TIBC, IRON, RETICCTPCT in the last 72 hours. Urine analysis:    Component Value Date/Time   COLORURINE STRAW (A) 04/10/2018 2043   APPEARANCEUR CLEAR 04/10/2018 2043   LABSPEC 1.008 04/10/2018 2043   PHURINE 7.0 04/10/2018 2043   [place] 04/10/2018 2043   HGBUR NEGATIVE 04/10/2018 2043   [place] NEGATIVE 04/10/2018 2043   [place] NEGATIVE 04/10/2018 2043   PROTEINUR NEGATIVE 04/10/2018 2043   NITRITE NEGATIVE 04/10/2018 2043   LEUKOCYTESUR NEGATIVE 04/10/2018 2043   Sepsis Labs: @LABRCNTIP (procalcitonin:4,lacticidven:4) )No results found for this or any previous visit (from the past 240 hour(s)).   Radiological Exams on Admission: Dg Chest 2 View  Result Date: 04/10/2018 CLINICAL DATA:  Generalized weakness for 2 weeks.  Hyponatremic. EXAM: CHEST - 2 VIEW COMPARISON:  Chest radiograph November 03, 2017 FINDINGS: Cardiac silhouette is normal in size. Mildly tortuous calcified aorta associated chronic hypertension. Similar chronic interstitial changes and increased lung volumes without  pleural effusion or focal consolidation. LEFT lung base density could reflect anterior rib or nipple shadow. No pneumothorax. Old RIGHT rib fracture versus exostosis. Advanced degenerative change of the thoracic spine. IMPRESSION: 1. No acute cardiopulmonary process. 2.  Emphysema (ICD10-J43.9). 3.  Aortic Atherosclerosis (ICD10-I70.0). Electronically Signed   By: Elon Alas M.D.   On: 04/10/2018 22:06    EKG: Independently reviewed.  Normal sinus rhythm.  Assessment/Plan Principal Problem:   Hyponatremia Active Problems:   HTN (hypertension)   Wheezing    1. Hyponatremia -cause not clear.  Check urine studies.  Patient had similar symptoms in June at that time patient sodium improved with IV fluids and sodium tablets.  Patient received 500 cc normal saline bolus in the ER.  I have placed patient on sodium chloride infusion 50 cc/h.  Follow metabolic panel closely.  Check urine studies including urine osmolality and urine sodium and check TSH. 2. Chronic wheezing being followed by pulmonologist.  Was recently placed on prednisone which patient has completed course yesterday. 3. Acute encephalopathy which I think has improved in my exam patient appears well oriented.  Encephalopathy could have been due to sodium and also could have been due to recent prednisone placement. 4. Hyperlipidemia on statins. 5. Hypertension on amlodipine Cozaar and metoprolol.   DVT prophylaxis: Lovenox. Code Status: DNR. Family Communication: Discussed with patient. Disposition Plan: Back to independent living facility. Consults called: None. Admission status: Observation.   Rise Patience MD [facility] Pager [phone].  If 7PM-7AM, please contact night-coverage [email or web address] Password [facility]  04/11/2018, 12:43 AM

## 2018-04-11 NOTE — Consult Note (Signed)
Reason for Consult: Hyponatremia-acute on chronic Referring Physician: Cordelia Poche MD [facility])  HPI:  84 year old Caucasian woman with past history of hypertension, dyslipidemia, chronic hyponatremia (apparent baseline sodium around 130) and recent wheezing/cough for the past 2 weeks for which she was placed on prednisone last week.  She developed some confusion and hallucinations yesterday prompting labs to be checked that showed a sodium of 116 for which she was asked to come to the emergency room.  She reports to have been drinking more fluids to try and alleviate her cough.  She denies any nausea or vomiting but had 2 bouts of diarrhea last week.  She denies any chest pain and has had minimal exertional shortness of breath.  She denies any orthostatic dizziness, leg swelling, dysuria, urgency, frequency, flank pain, fever or chills.  Overnight, her sodium is improved from 120 to 126 and she reports corresponding improvement of her mental status.  Her urine osmolality is 319 and serum osmolality is pending.  Previous work-up indicates that she has a reset osmostat with history of poor solute intake and possibly SIADH.   Past Medical History:  Diagnosis Date  . Carpal tunnel syndrome    left  . DVT (deep venous thrombosis) ([place])   . GERD (gastroesophageal reflux disease)   . Hyperlipemia   . Hypertension   . RLS (restless legs syndrome)     Past Surgical History:  Procedure Laterality Date  . ABDOMINAL HYSTERECTOMY    . CARPAL TUNNEL RELEASE Left 10/31/2014   Procedure: LEFT CARPAL TUNNEL RELEASE;  Surgeon: Leanora Cover, MD;  Location: [place];  Service: Orthopedics;  Laterality: Left;  . CARPAL TUNNEL RELEASE Right 02/12/2016   Procedure: CARPAL TUNNEL RELEASE, right;  Surgeon: Leanora Cover, MD;  Location: [place];  Service: Orthopedics;  Laterality: Right;  . GALLBLADDER SURGERY      Family History  Problem Relation Age of Onset  . Heart failure  Father   . Heart failure Mother   . Cancer Sister        breast cancer  . Diabetes Brother     Social History:  reports that she quit smoking about 27 years ago. Her smoking use included cigarettes. She has a 45.00 pack-year smoking history. She has never used smokeless tobacco. She reports that she drinks alcohol. She reports that she does not use drugs.  Allergies:  Allergies  Allergen Reactions  . Adhesive [Tape] Itching  . Chlorthalidone Other (See Comments)    creatinine  increases  . Hydrochlorothiazide Other (See Comments)    CREATININE INCREASES  . Oxsoralen [Methoxsalen]     Medications:  Scheduled: . amLODipine  5 mg Oral Daily  . atorvastatin  20 mg Oral Daily  . calcium carbonate  1,250 mg Oral Daily  . cholecalciferol  1,000 Units Oral Daily  . clopidogrel  75 mg Oral Daily  . enoxaparin (LOVENOX) injection  40 mg Subcutaneous QHS  . fluticasone  2 spray Each Nare Daily  . loratadine  10 mg Oral Daily  . losartan  100 mg Oral Daily  . metoprolol succinate  50 mg Oral Daily  . multivitamin with minerals  1 tablet Oral Daily  . rOPINIRole  1 mg Oral QHS    BMP Latest Ref Rng & Units 04/11/2018 04/11/2018 04/11/2018  Glucose 70 - 99 mg/dL 101(H) 94 104(H)  BUN 8 - 23 mg/dL 15 16 17   Creatinine 0.44 - 1.00 mg/dL 0.61 0.70 0.69  Sodium 135 - 145 mmol/L  126(L) 126(L) 125(L)  Potassium 3.5 - 5.1 mmol/L 3.8 3.6 3.7  Chloride 98 - 111 mmol/L 92(L) 92(L) 92(L)  CO2 22 - 32 mmol/L 28 25 24   Calcium 8.9 - 10.3 mg/dL 8.8(L) 8.7(L) 8.9   CBC Latest Ref Rng & Units 04/11/2018 04/11/2018 04/10/2018  WBC 4.0 - 10.5 K/uL 9.6 7.7 7.6  Hemoglobin 12.0 - 15.0 g/dL 11.7(L) 12.4 12.6  Hematocrit 36.0 - 46.0 % 34.8(L) 36.6 36.8  Platelets 150 - 400 K/uL 183 196 216     Dg Chest 2 View  Result Date: 04/10/2018 CLINICAL DATA:  Generalized weakness for 2 weeks.  Hyponatremic. EXAM: CHEST - 2 VIEW COMPARISON:  Chest radiograph November 03, 2017 FINDINGS: Cardiac silhouette is normal in  size. Mildly tortuous calcified aorta associated chronic hypertension. Similar chronic interstitial changes and increased lung volumes without pleural effusion or focal consolidation. LEFT lung base density could reflect anterior rib or nipple shadow. No pneumothorax. Old RIGHT rib fracture versus exostosis. Advanced degenerative change of the thoracic spine. IMPRESSION: 1. No acute cardiopulmonary process. 2.  Emphysema (ICD10-J43.9). 3.  Aortic Atherosclerosis (ICD10-I70.0). Electronically Signed   By: Elon Alas M.D.   On: 04/10/2018 22:06    Review of Systems  Constitutional: Positive for malaise/fatigue. Negative for chills and fever.  HENT: Negative.   Eyes: Negative.   Respiratory: Positive for cough and wheezing. Negative for hemoptysis and shortness of breath.   Cardiovascular: Negative.   Gastrointestinal: Positive for diarrhea. Negative for heartburn, nausea and vomiting.  Genitourinary: Negative.   Musculoskeletal: Negative.   Skin: Negative.   Neurological: Negative.   Psychiatric/Behavioral: Positive for hallucinations.   Blood pressure (!) 153/80, pulse 72, temperature 98 F (36.7 C), temperature source Oral, resp. rate 17, height 5\' 2"  (1.575 m), weight 64.7 kg, SpO2 96 %. Physical Exam  Nursing note and vitals reviewed. Constitutional: She is oriented to person, place, and time. She appears well-developed and well-nourished. No distress.  HENT:  Head: Normocephalic.  Mouth/Throat: Oropharynx is clear and moist. No oropharyngeal exudate.  Eyes: Pupils are equal, round, and reactive to light. Conjunctivae and EOM are normal. No scleral icterus.  Neck: Normal range of motion. Neck supple. No JVD present.  Cardiovascular: Normal rate, regular rhythm and normal heart sounds. Exam reveals no gallop.  Respiratory: Effort normal. She has wheezes. She has no rales.  GI: Soft. Bowel sounds are normal. There is no tenderness. There is no rebound.  Musculoskeletal: She  exhibits no edema or tenderness.  Neurological: She is alert and oriented to person, place, and time.  Skin: Skin is warm and dry. No rash noted. No erythema.  Psychiatric: She has a normal mood and affect. Her behavior is normal.    Assessment/Plan: 1.  Acute on chronic hyponatremia: She is euvolemic to slightly hypovolemic on physical exam.  I suspect that the acute driver of her recent worsening was non-osmotic stimulation of ADH production with COPD exacerbation in the setting of transient corticosteroid use along with increased free water intake.  Agree with discontinuation of normal saline as soon as her next lab is resulted and putting her on fluid restriction.  We had a lengthy discussion regarding the importance of outpatient fluid restriction and will reevaluate need for furosemide.  TSH level checked within normal limits. 2.  Acute encephalopathy: Likely from hyponatremia but not unlikely to also have been from recent corticosteroid use.  Resolved at this time. 3.  Hypertension: Chronic, may have been exacerbated by recent corticosteroid use-hold losartan to allow  for more efficient correction of hyponatremia.  Blood pressure treatment targets should be tailored accordingly for her advanced age. 4.  Wheezing: Suspected to be upper respiratory tract although chest x-ray raises some concern for emphysema.  Status post recent prednisone.  Vonita Calloway K. 04/11/2018, 12:25 PM

## 2018-04-12 LAB — RENAL FUNCTION PANEL
Albumin: 3.5 g/dL (ref 3.5–5.0)
Anion gap: 10 (ref 5–15)
BUN: 23 mg/dL (ref 8–23)
CO2: 24 mmol/L (ref 22–32)
Calcium: 8.7 mg/dL — ABNORMAL LOW (ref 8.9–10.3)
Chloride: 91 mmol/L — ABNORMAL LOW (ref 98–111)
Creatinine, Ser: 0.82 mg/dL (ref 0.44–1.00)
GFR calc Af Amer: >60 mL/min (ref >60)
GFR calc non Af Amer: >60 mL/min (ref >60)
GLUCOSE: 89 mg/dL (ref 70–99)
Phosphorus: 3.2 mg/dL (ref 2.5–4.6)
Potassium: 3.6 mmol/L (ref 3.5–5.1)
Sodium: 125 mmol/L — ABNORMAL LOW (ref 135–145)

## 2018-04-12 LAB — BASIC METABOLIC PANEL
Anion gap: 7 (ref 5–15)
BUN: 22 mg/dL (ref 8–23)
CO2: 27 mmol/L (ref 22–32)
Calcium: 9 mg/dL (ref 8.9–10.3)
Chloride: 91 mmol/L — ABNORMAL LOW (ref 98–111)
Creatinine, Ser: 0.83 mg/dL (ref 0.44–1.00)
GFR calc Af Amer: >60 mL/min (ref >60)
GLUCOSE: 110 mg/dL — AB (ref 70–99)
Potassium: 3.3 mmol/L — ABNORMAL LOW (ref 3.5–5.1)
Sodium: 125 mmol/L — ABNORMAL LOW (ref 135–145)

## 2018-04-12 MED ORDER — FUROSEMIDE 20 MG PO TABS
20.0000 mg | ORAL_TABLET | Freq: Once | ORAL | Status: AC
  Administered 2018-04-12: 20 mg via ORAL
  Filled 2018-04-12: qty 1

## 2018-04-12 NOTE — Progress Notes (Signed)
   04/12/18 1456  Clinical Encounter Type  Visited With Patient  Visit Type Initial  Referral From Physician  Consult/Referral To Chaplain  The chaplain responded to Pt. consult for prayer from MD.  The chaplain was greeted by smiling Pt. preparing with assistance to leave as I talked at the door.  The Pt. accepted the chaplain's offer to include the Pt. in her personal prayers.

## 2018-04-12 NOTE — Discharge Summary (Signed)
Physician Discharge Summary  Cassidy Ellison XBJ:[id number] DOB: 04-23-1922 DOA: 04/10/2018  PCP: Harlan Stains, MD  Admit date: 04/10/2018 Discharge date: 04/12/2018  Admitted From: home Disposition:  Home  Recommendations for Outpatient Follow-up:  1. Follow up with PCP next week 2. Check a basic metabolic panel as an outpatient to follow-up on the sodium   Home Health:no Equipment/Devices:none  Discharge Condition:stable CODE STATUS: DNR Diet recommendation: Heart Healthy  Brief/Interim Summary: 84 y.o. female past medical history of hypertension recently being worked up for wheezing and was placed on prednisone she has been tapered off it and family found her to be confused.  In the ED her sodium was 116.  Discharge Diagnoses:  Principal Problem:   Hyponatremia Active Problems:   HTN (hypertension)   Wheezing  Hyponatremia: Nephrology was consulted and they suspect ADH production in the setting of a COPD exacerbation with transient corticosteroids use and increase free water. And recommended fluid restriction.   Her sodium improved Nephrology recommended follow-up labs done at primary care doctor on Friday or Monday of next week. Cont Fluid restriction at home.  Acute encephalopathy: Likely due to hyponatremia now resolved.  Chronic wheezing: Now resolved. Follow-up with pulmonary as an outpatient. Discharge Instructions  Discharge Instructions    Diet - low sodium heart healthy   Complete by:  As directed    Increase activity slowly   Complete by:  As directed      Allergies as of 04/12/2018      Reactions   Adhesive [tape] Itching   Chlorthalidone Other (See Comments)   creatinine  increases   Hydrochlorothiazide Other (See Comments)   CREATININE INCREASES   Oxsoralen [methoxsalen]       Medication List    TAKE these medications   amLODipine 5 MG tablet Commonly known as:  NORVASC Take 5 mg by mouth daily.   atorvastatin 20 MG tablet Commonly  known as:  LIPITOR Take 20 mg by mouth daily.   betamethasone dipropionate 0.05 % ointment Commonly known as:  DIPROLENE Apply 1 application topically 2 (two) times daily.   CALCIUM 600 PO Take 600 mg by mouth daily.   clopidogrel 75 MG tablet Commonly known as:  PLAVIX Take 1 tablet (75 mg total) by mouth daily.   fexofenadine 180 MG tablet Commonly known as:  ALLEGRA Take 180 mg by mouth daily.   fluticasone 50 MCG/ACT nasal spray Commonly known as:  FLONASE Place 2 sprays into both nostrils daily.   losartan 100 MG tablet Commonly known as:  COZAAR Take 100 mg by mouth daily.   metoprolol succinate 50 MG 24 hr tablet Commonly known as:  TOPROL-XL Take 1 tablet (50 mg total) by mouth daily.   multivitamin with minerals tablet Take 1 tablet by mouth daily.   predniSONE 10 MG tablet Commonly known as:  DELTASONE Take 3 tablets (30 mg total) by mouth daily with breakfast.   rOPINIRole 1 MG tablet Commonly known as:  REQUIP Take 1 mg by mouth at bedtime.   vitamin C 500 MG tablet Commonly known as:  ASCORBIC ACID Take 500 mg by mouth daily.   Vitamin D-3 25 MCG (1000 UT) Caps Take 1,000 Units by mouth daily.       Allergies  Allergen Reactions  . Adhesive [Tape] Itching  . Chlorthalidone Other (See Comments)    creatinine  increases  . Hydrochlorothiazide Other (See Comments)    CREATININE INCREASES  . Oxsoralen [Methoxsalen]     Consultations:  Nephrology  Procedures/Studies: Dg Chest 2 View  Result Date: 04/10/2018 CLINICAL DATA:  Generalized weakness for 2 weeks.  Hyponatremic. EXAM: CHEST - 2 VIEW COMPARISON:  Chest radiograph November 03, 2017 FINDINGS: Cardiac silhouette is normal in size. Mildly tortuous calcified aorta associated chronic hypertension. Similar chronic interstitial changes and increased lung volumes without pleural effusion or focal consolidation. LEFT lung base density could reflect anterior rib or nipple shadow. No pneumothorax.  Old RIGHT rib fracture versus exostosis. Advanced degenerative change of the thoracic spine. IMPRESSION: 1. No acute cardiopulmonary process. 2.  Emphysema (ICD10-J43.9). 3.  Aortic Atherosclerosis (ICD10-I70.0). Electronically Signed   By: Elon Alas M.D.   On: 04/10/2018 22:06     Subjective: No complaints feels great.  Discharge Exam: Vitals:   04/12/18 0440 04/12/18 0953  BP: 140/69 (!) 116/49  Pulse: 74 75  Resp: 17   Temp: 98.8 F (37.1 C) 98.3 F (36.8 C)  SpO2: 95% 94%   Vitals:   04/11/18 1448 04/11/18 2101 04/12/18 0440 04/12/18 0953  BP: (!) 101/52 129/62 140/69 (!) 116/49  Pulse: 66 64 74 75  Resp: 20 16 17    Temp: 98 F (36.7 C) 97.9 F (36.6 C) 98.8 F (37.1 C) 98.3 F (36.8 C)  TempSrc: Oral Oral Oral Oral  SpO2: 95% 95% 95% 94%  Weight:   64 kg   Height:        General: Pt is alert, awake, not in acute distress Cardiovascular: RRR, S1/S2 +, no rubs, no gallops Respiratory: CTA bilaterally, no wheezing, no rhonchi Abdominal: Soft, NT, ND, bowel sounds + Extremities: no edema, no cyanosis    The results of significant diagnostics from this hospitalization (including imaging, microbiology, ancillary and laboratory) are listed below for reference.     Microbiology: Recent Results (from the past 240 hour(s))  MRSA PCR Screening     Status: None   Collection Time: 04/10/18 11:42 PM  Result Value Ref Range Status   MRSA by PCR NEGATIVE NEGATIVE Final    Comment:        The GeneXpert MRSA Assay (FDA approved for NASAL specimens only), is one component of a comprehensive MRSA colonization surveillance program. It is not intended to diagnose MRSA infection nor to guide or monitor treatment for MRSA infections. Performed at [facility], [place] [address] [place], [place] [address]      Labs: BNP (last 3 results) Recent Labs    11/03/17 0449  BNP 945.0*   Basic Metabolic Panel: Recent Labs  Lab 04/11/18 0059  04/11/18 0423 04/11/18 0825 04/11/18 1254 04/12/18 0454  NA 125* 126* 126* 123* 125*  K 3.7 3.6 3.8 3.4* 3.6  CL 92* 92* 92* 89* 91*  CO2 24 25 28 25 24   GLUCOSE 104* 94 101* 103* 89  BUN 17 16 15 17 23   CREATININE 0.69 0.70 0.61 0.67 0.82  CALCIUM 8.9 8.7* 8.8* 8.8* 8.7*  PHOS  --   --   --   --  3.2   Liver Function Tests: Recent Labs  Lab 04/12/18 0454  ALBUMIN 3.5   No results for input(s): LIPASE, AMYLASE in the last 168 hours. No results for input(s): AMMONIA in the last 168 hours. CBC: Recent Labs  Lab 04/10/18 2043 04/11/18 0059 04/11/18 0423  WBC 7.6 7.7 9.6  NEUTROABS 5.9  --   --   HGB 12.6 12.4 11.7*  HCT 36.8 36.6 34.8*  MCV 80.0 82.8 83.5  PLT 216 196 183   Cardiac Enzymes: No  results for input(s): CKTOTAL, CKMB, CKMBINDEX, TROPONINI in the last 168 hours. BNP: Invalid input(s): POCBNP CBG: No results for input(s): GLUCAP in the last 168 hours. D-Dimer No results for input(s): DDIMER in the last 72 hours. Hgb A1c No results for input(s): HGBA1C in the last 72 hours. Lipid Profile No results for input(s): CHOL, HDL, LDLCALC, TRIG, CHOLHDL, LDLDIRECT in the last 72 hours. Thyroid function studies Recent Labs    04/11/18 0059  TSH 1.261   Anemia work up No results for input(s): VITAMINB12, FOLATE, FERRITIN, TIBC, IRON, RETICCTPCT in the last 72 hours. Urinalysis    Component Value Date/Time   COLORURINE STRAW (A) 04/10/2018 2043   APPEARANCEUR CLEAR 04/10/2018 2043   LABSPEC 1.008 04/10/2018 2043   PHURINE 7.0 04/10/2018 2043   GLUCOSEU NEGATIVE 04/10/2018 2043   HGBUR NEGATIVE 04/10/2018 2043   [place] NEGATIVE 04/10/2018 2043   [place] NEGATIVE 04/10/2018 2043   PROTEINUR NEGATIVE 04/10/2018 2043   NITRITE NEGATIVE 04/10/2018 2043   LEUKOCYTESUR NEGATIVE 04/10/2018 2043   Sepsis Labs Invalid input(s): PROCALCITONIN,  WBC,  LACTICIDVEN Microbiology Recent Results (from the past 240 hour(s))  MRSA PCR Screening     Status:  None   Collection Time: 04/10/18 11:42 PM  Result Value Ref Range Status   MRSA by PCR NEGATIVE NEGATIVE Final    Comment:        The GeneXpert MRSA Assay (FDA approved for NASAL specimens only), is one component of a comprehensive MRSA colonization surveillance program. It is not intended to diagnose MRSA infection nor to guide or monitor treatment for MRSA infections. Performed at [facility], [place] [address], [place], [place] [address]      Time coordinating discharge: Over 30 minutes  SIGNED:   Charlynne Cousins, MD  [facility] 04/12/2018, 1:26 PM Pager   If 7PM-7AM, please contact night-coverage [email or web address] Password [email or web address]

## 2018-04-12 NOTE — Progress Notes (Signed)
Patient ID: Cassidy Ellison, female   DOB: 1921/12/23, 84 y.o.   MRN: [id number]  [facility] Progress Note   Assessment/ Plan:   1.  Acute on chronic hyponatremia: Sodium level appears to have settled out around 125-126 and I anticipate it will continue to improve over the next several days.  She does not have any neurological symptoms arising from her current sodium level and if she is able to ambulate without difficulty, would recommend discharge home with follow-up labs to be done by her primary care provider on Friday of this week or Monday of next.  I will give her an additional dose of furosemide 20 mg today to help promote aquaresis.  No indication for tolvaptan/hypertonic saline.  I recounted fluid restriction 1.2 L/day with her. 2.  Acute encephalopathy: Suspected to be from hyponatremia/recent corticosteroid use-now back to baseline per granddaughter who is at her side 3.  Hypertension: Blood pressure currently well controlled, I have reservations of using an ARB in her given risk for volume depletion/acute kidney injury as well as its effects on sodium handling. 4.  Wheezing: Suspected to be from upper respiratory tract although chest x-ray shows possible emphysema-status post course of prednisone by pulmonology.  Subjective:   She reports to be feeling better with reduced wheezing today.   Objective:   BP (!) 116/49 (BP Location: Left Arm)   Pulse 75   Temp 98.3 F (36.8 C) (Oral)   Resp 17   Ht 5\' 2"  (1.575 m)   Wt 64 kg   SpO2 94%   BMI 25.81 kg/m   Intake/Output Summary (Last 24 hours) at 04/12/2018 1147 Last data filed at 04/11/2018 1500 Gross per 24 hour  Intake 400 ml  Output -  Net 400 ml   Weight change: -1.772 kg  Physical Exam: Gen: Comfortably sitting up in recliner, granddaughter by her side CVS: Pulse regular rhythm, normal rate, S1 and S2 normal Resp: Distant breath sounds bilaterally, no rales/rhonchi Abd: Soft, flat, nontender Ext: No lower  extremity edema  Imaging: Dg Chest 2 View  Result Date: 04/10/2018 CLINICAL DATA:  Generalized weakness for 2 weeks.  Hyponatremic. EXAM: CHEST - 2 VIEW COMPARISON:  Chest radiograph November 03, 2017 FINDINGS: Cardiac silhouette is normal in size. Mildly tortuous calcified aorta associated chronic hypertension. Similar chronic interstitial changes and increased lung volumes without pleural effusion or focal consolidation. LEFT lung base density could reflect anterior rib or nipple shadow. No pneumothorax. Old RIGHT rib fracture versus exostosis. Advanced degenerative change of the thoracic spine. IMPRESSION: 1. No acute cardiopulmonary process. 2.  Emphysema (ICD10-J43.9). 3.  Aortic Atherosclerosis (ICD10-I70.0). Electronically Signed   By: Elon Alas M.D.   On: 04/10/2018 22:06    Labs: BMET Recent Labs  Lab 04/10/18 2043 04/11/18 0059 04/11/18 0423 04/11/18 0825 04/11/18 1254 04/12/18 0454  NA 120* 125* 126* 126* 123* 125*  K 4.2 3.7 3.6 3.8 3.4* 3.6  CL 87* 92* 92* 92* 89* 91*  CO2 23 24 25 28 25 24   GLUCOSE 107* 104* 94 101* 103* 89  BUN 22 17 16 15 17 23   CREATININE 0.72 0.69 0.70 0.61 0.67 0.82  CALCIUM 9.0 8.9 8.7* 8.8* 8.8* 8.7*  PHOS  --   --   --   --   --  3.2   CBC Recent Labs  Lab 04/10/18 2043 04/11/18 0059 04/11/18 0423  WBC 7.6 7.7 9.6  NEUTROABS 5.9  --   --   HGB 12.6 12.4 11.7*  HCT 36.8 36.6 34.8*  MCV 80.0 82.8 83.5  PLT 216 196 183   Medications:    . amLODipine  5 mg Oral Daily  . atorvastatin  20 mg Oral Daily  . calcium carbonate  1,250 mg Oral Daily  . cholecalciferol  1,000 Units Oral Daily  . clopidogrel  75 mg Oral Daily  . enoxaparin (LOVENOX) injection  40 mg Subcutaneous QHS  . fluticasone  2 spray Each Nare Daily  . furosemide  20 mg Oral Once  . loratadine  10 mg Oral Daily  . losartan  100 mg Oral Daily  . metoprolol succinate  50 mg Oral Daily  . multivitamin with minerals  1 tablet Oral Daily  . polyethylene glycol  17 g  Oral Daily  . rOPINIRole  1 mg Oral QHS   Elmarie Shiley, MD 04/12/2018, 11:47 AM

## 2018-04-17 DIAGNOSIS — E871 Hypo-osmolality and hyponatremia: Secondary | ICD-10-CM | POA: Diagnosis not present

## 2018-04-26 DIAGNOSIS — E871 Hypo-osmolality and hyponatremia: Secondary | ICD-10-CM | POA: Diagnosis not present

## 2018-05-08 ENCOUNTER — Ambulatory Visit (INDEPENDENT_AMBULATORY_CARE_PROVIDER_SITE_OTHER): Payer: Medicare Other | Admitting: Emergency Medicine

## 2018-05-08 ENCOUNTER — Encounter: Payer: Self-pay | Admitting: Emergency Medicine

## 2018-05-08 DIAGNOSIS — R061 Stridor: Secondary | ICD-10-CM | POA: Diagnosis not present

## 2018-05-08 DIAGNOSIS — R062 Wheezing: Secondary | ICD-10-CM | POA: Diagnosis not present

## 2018-05-08 DIAGNOSIS — R911 Solitary pulmonary nodule: Secondary | ICD-10-CM | POA: Diagnosis not present

## 2018-05-08 NOTE — Patient Instructions (Signed)
Please continue Allegra and your nasal spray as you have been taking them. If your throat irritation and noise begins to worsen you could consider restarting your Pepcid or starting omeprazole 20 mg once daily. We will hold off on performing bronchoscopy at this time.  We could reconsider going forward depending on how your throat irritation behaves. We will hold off on starting an inhaled medication for now. We will not repeat your CT scan of the chest to follow your pulmonary nodules at this time Follow with Dr Lamonte Sakai if needed for any changes in your breathing or your upper airway noise.

## 2018-05-08 NOTE — Progress Notes (Signed)
Subjective:    Patient ID: Cassidy Ellison, female    DOB: [birth year], 84 y.o.   MRN: [id number]  HPI  ROV 04/05/18--follow-up visit for her 84 year old woman with a history of former tobacco use, right upper lobe groundglass nodule that was stable as of 06/2016 but no follow-up since.  I saw her for wheezing and associated dyspnea that sound like he had a component of upper airway noise, was associated in part with cough.  Last time we empirically started Pepcid and Allegra.  She underwent pulmonary function testing today which I reviewed.  This showed spirometry with mild obstruction and some curve to her flow volume loop was consistent with airflow obstruction. Since last time her cough has gotten worse, lasts all day, prod of thick clear. She has intermittent stridor through the day. Denies any GERD sx. She saw Dr Redmond Baseman w ENT > the laryngoscopy was reassuring.   ROV 05/08/18 --84 year old woman who follows up today for her history of stridor, mild obstructive lung disease, right upper lobe groundglass nodule.  Last time we added the nasal steroid to Allegra, stopped Pepcid.  I treated her empirically with a brief course of prednisone.  Unfortunately she had a recurrent episode of hyponatremia - had to be hospitalized, ? Whether this was connected to the prednisone (second time this pattern has happened to her).   We decided to be conservative with regard to her abnormal CT scan of the chest, no plans to repeat at this time.  Revisit going forward   Review of Systems  Constitutional: Negative for fever and unexpected weight change.  HENT: Negative for congestion, dental problem, ear pain, nosebleeds, postnasal drip, rhinorrhea, sinus pressure, sneezing, sore throat and trouble swallowing.   Eyes: Negative for redness and itching.  Respiratory: Positive for cough and wheezing. Negative for chest tightness and shortness of breath.   Cardiovascular: Negative for palpitations and leg swelling.    Gastrointestinal: Negative for nausea and vomiting.  Genitourinary: Negative for dysuria.  Musculoskeletal: Negative for joint swelling.  Skin: Negative for rash.  Neurological: Negative for headaches.  Hematological: Does not bruise/bleed easily.  Psychiatric/Behavioral: Negative for dysphoric mood. The patient is not nervous/anxious.     Past Medical History:  Diagnosis Date  . Carpal tunnel syndrome    left  . DVT (deep venous thrombosis) ([place])   . GERD (gastroesophageal reflux disease)   . Hyperlipemia   . Hypertension   . RLS (restless legs syndrome)      Family History  Problem Relation Age of Onset  . Heart failure Father   . Heart failure Mother   . Cancer Sister        breast cancer  . Diabetes Brother      Social History   Socioeconomic History  . Marital status: Widowed    Spouse name: Not on file  . Number of children: Not on file  . Years of education: Not on file  . Highest education level: Not on file  Occupational History  . Not on file  Social Needs  . Financial resource strain: Not hard at all  . Food insecurity:    Worry: Never true    Inability: Never true  . Transportation needs:    Medical: No    Non-medical: No  Tobacco Use  . Smoking status: Former Smoker    Packs/day: 1.50    Years: 30.00    Pack years: 45.00    Types: Cigarettes    Last attempt to quit:  05/10/1990    Years since quitting: 28.0  . Smokeless tobacco: Never Used  . Tobacco comment: quit 20years ago  Substance and Sexual Activity  . Alcohol use: Yes    Comment: social events  . Drug use: No  . Sexual activity: Not on file  Lifestyle  . Physical activity:    Days per week: 7 days    Minutes per session: 60 min  . Stress: Only a little  Relationships  . Social connections:    Talks on phone: More than three times a week    Gets together: More than three times a week    Attends religious service: Never    Active member of club or organization: No    Attends  meetings of clubs or organizations: Never    Relationship status: Widowed  . Intimate partner violence:    Fear of current or ex partner: No    Emotionally abused: No    Physically abused: No    Forced sexual activity: No  Other Topics Concern  . Not on file  Social History Narrative  . Not on file     Allergies  Allergen Reactions  . Corticosteroids Other (See Comments)    Caused patient to have low sodium levels  . Adhesive [Tape] Itching  . Chlorthalidone Other (See Comments)    creatinine  increases  . Hydrochlorothiazide Other (See Comments)    CREATININE INCREASES  . Oxsoralen [Methoxsalen]      Outpatient Medications Prior to Visit  Medication Sig Dispense Refill  . amLODipine (NORVASC) 5 MG tablet Take 5 mg by mouth daily.     Marland Kitchen atorvastatin (LIPITOR) 20 MG tablet Take 20 mg by mouth daily.     . betamethasone dipropionate (DIPROLENE) 0.05 % ointment Apply 1 application topically 2 (two) times daily.   2  . Calcium Carbonate (CALCIUM 600 PO) Take 600 mg by mouth daily.    . Cholecalciferol (VITAMIN D-3) 1000 units CAPS Take 1,000 Units by mouth daily.    . clopidogrel (PLAVIX) 75 MG tablet Take 1 tablet (75 mg total) by mouth daily. 90 tablet 3  . fexofenadine (ALLEGRA) 180 MG tablet Take 180 mg by mouth daily.    . fluticasone (FLONASE) 50 MCG/ACT nasal spray Place 2 sprays into both nostrils daily. 16 g 5  . losartan (COZAAR) 100 MG tablet Take 100 mg by mouth daily.    . metoprolol succinate (TOPROL-XL) 50 MG 24 hr tablet Take 1 tablet (50 mg total) by mouth daily. 30 tablet 3  . Multiple Vitamins-Minerals (MULTIVITAMIN WITH MINERALS) tablet Take 1 tablet by mouth daily.     Marland Kitchen rOPINIRole (REQUIP) 1 MG tablet Take 1 mg by mouth at bedtime.    . vitamin C (ASCORBIC ACID) 500 MG tablet Take 500 mg by mouth daily.    . predniSONE (DELTASONE) 10 MG tablet Take 3 tablets (30 mg total) by mouth daily with breakfast. 21 tablet 0   No facility-administered medications  prior to visit.         Objective:   Physical Exam  Vitals:   05/08/18 1606  BP: 128/82  Pulse: 72  SpO2: 95%  Weight: 142 lb (64.4 kg)  Height: 5\' 2"  (1.575 m)  Gen: Pleasant elderly woman, well-nourished, in no distress,  normal affect  ENT: No lesions,  mouth clear,  oropharynx clear, no postnasal drip  Neck: No JVD, only very soft subtle upper airway noise, much better than last visit  Lungs: No use of  accessory muscles, no no crackles, no overt wheezing  Cardiovascular: RRR, heart sounds normal, no murmur or gallops, no peripheral edema  Musculoskeletal: No deformities, no cyanosis or clubbing  Neuro: alert, non focal  Skin: Warm, no lesions or rash     Assessment & Plan:  Stridor Suspect that there is contribution of chronic rhinitis, may have benefited from the addition of a nasal steroid.  She had resolution when she was on prednisone but clearly she does not tolerate, was hospitalized (again) for hyponatremia.  She should not take corticosteroids again if at all possible.  She is off GERD therapy but we could re-add this if the upper airway noise begins to return.  Please continue Allegra and your nasal spray as you have been taking them. If your throat irritation and noise begins to worsen you could consider restarting your Pepcid or starting omeprazole 20 mg once daily. We will hold off on performing bronchoscopy at this time.  We could reconsider going forward depending on how your throat irritation behaves. Follow with Dr Lamonte Sakai if needed for any changes in your breathing or your upper airway noise.  Pulmonary nodule Probably low yield to continue to follow the small groundglass nodules.  We will defer CT scan unless there is clinical change  Wheezing She does have mild obstruction on her pulmonary function testing but I think that treating her upper airway noise is much higher yield.  Also I am concerned that inhaled medications will be difficult for her to  perform and may cause worsening of her upper airway noise.  Baltazar Apo, MD, PhD 05/08/2018, 4:46 PM [facility] [phone] or if no answer [phone]

## 2018-05-08 NOTE — Assessment & Plan Note (Signed)
Probably low yield to continue to follow the small groundglass nodules.  We will defer CT scan unless there is clinical change

## 2018-05-08 NOTE — Assessment & Plan Note (Signed)
Suspect that there is contribution of chronic rhinitis, may have benefited from the addition of a nasal steroid.  She had resolution when she was on prednisone but clearly she does not tolerate, was hospitalized (again) for hyponatremia.  She should not take corticosteroids again if at all possible.  She is off GERD therapy but we could re-add this if the upper airway noise begins to return.  Please continue Allegra and your nasal spray as you have been taking them. If your throat irritation and noise begins to worsen you could consider restarting your Pepcid or starting omeprazole 20 mg once daily. We will hold off on performing bronchoscopy at this time.  We could reconsider going forward depending on how your throat irritation behaves. Follow with Dr Lamonte Sakai if needed for any changes in your breathing or your upper airway noise.

## 2018-05-08 NOTE — Assessment & Plan Note (Signed)
She does have mild obstruction on her pulmonary function testing but I think that treating her upper airway noise is much higher yield.  Also I am concerned that inhaled medications will be difficult for her to perform and may cause worsening of her upper airway noise.

## 2018-06-02 ENCOUNTER — Ambulatory Visit: Payer: Medicare Other | Admitting: Podiatry

## 2018-06-02 ENCOUNTER — Ambulatory Visit (INDEPENDENT_AMBULATORY_CARE_PROVIDER_SITE_OTHER): Payer: Medicare Other | Admitting: Podiatry

## 2018-06-02 ENCOUNTER — Encounter: Payer: Self-pay | Admitting: Podiatry

## 2018-06-02 VITALS — BP 129/73 | HR 75 | Resp 16

## 2018-06-02 DIAGNOSIS — B351 Tinea unguium: Secondary | ICD-10-CM

## 2018-06-02 DIAGNOSIS — M79675 Pain in left toe(s): Secondary | ICD-10-CM | POA: Diagnosis not present

## 2018-06-02 DIAGNOSIS — M79674 Pain in right toe(s): Secondary | ICD-10-CM

## 2018-06-02 DIAGNOSIS — D689 Coagulation defect, unspecified: Secondary | ICD-10-CM | POA: Diagnosis not present

## 2018-06-02 DIAGNOSIS — L84 Corns and callosities: Secondary | ICD-10-CM

## 2018-06-02 NOTE — Patient Instructions (Signed)

## 2018-06-16 ENCOUNTER — Encounter: Payer: Self-pay | Admitting: Podiatry

## 2018-06-16 NOTE — Progress Notes (Signed)
Subjective: Cassidy Ellison is a 85 y.o. y.o. female who presents today with painful, discolored, thick toenails  which interfere with daily activities. Pain is aggravated when wearing enclosed shoe gear. Pain is relieved with periodic professional debridement.  She continues to be on the blood thinner Plavix.   Patient's PCP is Harlan Stains, MD .   Current Outpatient Medications:  .  amLODipine (NORVASC) 5 MG tablet, Take 5 mg by mouth daily. , Disp: , Rfl:  .  atorvastatin (LIPITOR) 20 MG tablet, Take 20 mg by mouth daily. , Disp: , Rfl:  .  betamethasone dipropionate (DIPROLENE) 0.05 % ointment, Apply 1 application topically 2 (two) times daily. , Disp: , Rfl: 2 .  Calcium Carbonate (CALCIUM 600 PO), Take 600 mg by mouth daily., Disp: , Rfl:  .  Cholecalciferol (VITAMIN D-3) 1000 units CAPS, Take 1,000 Units by mouth daily., Disp: , Rfl:  .  clopidogrel (PLAVIX) 75 MG tablet, Take 1 tablet (75 mg total) by mouth daily., Disp: 90 tablet, Rfl: 3 .  fexofenadine (ALLEGRA) 180 MG tablet, Take 180 mg by mouth daily., Disp: , Rfl:  .  fluticasone (FLONASE) 50 MCG/ACT nasal spray, Place 2 sprays into both nostrils daily., Disp: 16 g, Rfl: 5 .  losartan (COZAAR) 100 MG tablet, Take 100 mg by mouth daily., Disp: , Rfl:  .  metoprolol succinate (TOPROL-XL) 50 MG 24 hr tablet, Take 1 tablet (50 mg total) by mouth daily., Disp: 30 tablet, Rfl: 3 .  Multiple Vitamins-Minerals (MULTIVITAMIN WITH MINERALS) tablet, Take 1 tablet by mouth daily. , Disp: , Rfl:  .  rOPINIRole (REQUIP) 1 MG tablet, Take 1 mg by mouth at bedtime., Disp: , Rfl:  .  vitamin C (ASCORBIC ACID) 500 MG tablet, Take 500 mg by mouth daily., Disp: , Rfl:    Allergies  Allergen Reactions  . Corticosteroids Other (See Comments)    Caused patient to have low sodium levels  . Adhesive [Tape] Itching  . Chlorthalidone Other (See Comments)    creatinine  increases  . Hydrochlorothiazide Other (See Comments)    CREATININE INCREASES  .  Oxsoralen [Methoxsalen]      Objective:  Vital signs: Blood pressure 129/73 Pulse 75 bpm Respirations 16  Vascular Examination: Capillary refill time immediate x10 digits Dorsalis pedis pulses 2/4 bilaterally Posterior tibial pulses 2/4 bilaterally No digital hair x 10 digits Skin temperature gradient within normal limits bilaterally  Dermatological Examination: Skin with normal turgor, texture and tone bilaterally  Toenails 1-5 b/l discolored, thick, dystrophic with subungual debris and pain with palpation to nailbeds due to thickness of nails.  Hyperkeratotic lesion distal tip third toe left foot with tenderness to palpation.  No edema, no erythema, no drainage, no flocculence noted.  Musculoskeletal: Muscle strength 5/5 to all LE muscle groups Hallux abductovalgus with bunion deformity bilaterally Contracted digits 2 through 5 left foot  Neurological: Sensation intact with 10 gram monofilament. Vibratory sensation intact.  Assessment: Painful onychomycosis toenails 1-5 b/l in patient on blood thinner.  Corn distal tip left third toe Patient on long-term blood thinner  Plan: 1. Toenails 1-5 b/l were debrided in length and girth without iatrogenic bleeding. 2. Corn pared third digit left foot without incident. 3. Patient to continue soft, supportive shoe gear. 4. Patient to report any pedal injuries to medical professional immediately. 5. Avoid self trimming due to use of blood thinner. 6. Follow up 3 months.  7. Patient/POA to call should there be a concern in the interim.

## 2018-07-06 DIAGNOSIS — E559 Vitamin D deficiency, unspecified: Secondary | ICD-10-CM | POA: Diagnosis not present

## 2018-07-06 DIAGNOSIS — R7303 Prediabetes: Secondary | ICD-10-CM | POA: Diagnosis not present

## 2018-07-06 DIAGNOSIS — L989 Disorder of the skin and subcutaneous tissue, unspecified: Secondary | ICD-10-CM | POA: Diagnosis not present

## 2018-07-06 DIAGNOSIS — E785 Hyperlipidemia, unspecified: Secondary | ICD-10-CM | POA: Diagnosis not present

## 2018-07-06 DIAGNOSIS — Z0000 Encounter for general adult medical examination without abnormal findings: Secondary | ICD-10-CM | POA: Diagnosis not present

## 2018-07-06 DIAGNOSIS — I10 Essential (primary) hypertension: Secondary | ICD-10-CM | POA: Diagnosis not present

## 2018-07-06 DIAGNOSIS — G2581 Restless legs syndrome: Secondary | ICD-10-CM | POA: Diagnosis not present

## 2018-07-06 DIAGNOSIS — I700 Atherosclerosis of aorta: Secondary | ICD-10-CM | POA: Diagnosis not present

## 2018-07-06 DIAGNOSIS — Z8673 Personal history of transient ischemic attack (TIA), and cerebral infarction without residual deficits: Secondary | ICD-10-CM | POA: Diagnosis not present

## 2018-07-06 DIAGNOSIS — M810 Age-related osteoporosis without current pathological fracture: Secondary | ICD-10-CM | POA: Diagnosis not present

## 2018-07-06 DIAGNOSIS — R197 Diarrhea, unspecified: Secondary | ICD-10-CM | POA: Diagnosis not present

## 2018-07-10 DIAGNOSIS — R197 Diarrhea, unspecified: Secondary | ICD-10-CM | POA: Diagnosis not present

## 2018-08-07 ENCOUNTER — Ambulatory Visit: Payer: Medicare Other | Admitting: Podiatry

## 2018-10-16 ENCOUNTER — Other Ambulatory Visit: Payer: Self-pay

## 2018-10-16 ENCOUNTER — Encounter: Payer: Self-pay | Admitting: Podiatry

## 2018-10-16 ENCOUNTER — Ambulatory Visit (INDEPENDENT_AMBULATORY_CARE_PROVIDER_SITE_OTHER): Payer: Medicare Other | Admitting: Podiatry

## 2018-10-16 VITALS — Temp 96.3°F

## 2018-10-16 DIAGNOSIS — D689 Coagulation defect, unspecified: Secondary | ICD-10-CM

## 2018-10-16 DIAGNOSIS — B351 Tinea unguium: Secondary | ICD-10-CM

## 2018-10-16 DIAGNOSIS — M79674 Pain in right toe(s): Secondary | ICD-10-CM | POA: Diagnosis not present

## 2018-10-16 DIAGNOSIS — M79675 Pain in left toe(s): Secondary | ICD-10-CM | POA: Diagnosis not present

## 2018-10-16 DIAGNOSIS — L84 Corns and callosities: Secondary | ICD-10-CM | POA: Diagnosis not present

## 2018-10-16 NOTE — Patient Instructions (Addendum)
Corns and Calluses Corns are small areas of thickened skin that occur on the top, sides, or tip of a toe. They contain a cone-shaped core with a point that can press on a nerve below. This causes pain.  Calluses are areas of thickened skin that can occur anywhere on the body, including the hands, fingers, palms, soles of the feet, and heels. Calluses are usually larger than corns. What are the causes? Corns and calluses are caused by rubbing (friction) or pressure, such as from shoes that are too tight or do not fit properly. What increases the risk? Corns are more likely to develop in people who have misshapen toes (toe deformities), such as hammer toes. Calluses can occur with friction to any area of the skin. They are more likely to develop in people who:  Work with their hands.  Wear shoes that fit poorly, are too tight, or are high-heeled.  Have toe deformities. What are the signs or symptoms? Symptoms of a corn or callus include:  A hard growth on the skin.  Pain or tenderness under the skin.  Redness and swelling.  Increased discomfort while wearing tight-fitting shoes, if your feet are affected. If a corn or callus becomes infected, symptoms may include:  Redness and swelling that gets worse.  Pain.  Fluid, blood, or pus draining from the corn or callus. How is this diagnosed? Corns and calluses may be diagnosed based on your symptoms, your medical history, and a physical exam. How is this treated? Treatment for corns and calluses may include:  Removing the cause of the friction or pressure. This may involve: ? Changing your shoes. ? Wearing shoe inserts (orthotics) or other protective layers in your shoes, such as a corn pad. ? Wearing gloves.  Applying medicine to the skin (topical medicine) to help soften skin in the hardened, thickened areas.  Removing layers of dead skin with a file to reduce the size of the corn or callus.  Removing the corn or callus with a  scalpel or laser.  Taking antibiotic medicines, if your corn or callus is infected.  Having surgery, if a toe deformity is the cause. Follow these instructions at home:   Take over-the-counter and prescription medicines only as told by your health care provider.  If you were prescribed an antibiotic, take it as told by your health care provider. Do not stop taking it even if your condition starts to improve.  Wear shoes that fit well. Avoid wearing high-heeled shoes and shoes that are too tight or too loose.  Wear any padding, protective layers, gloves, or orthotics as told by your health care provider.  Soak your hands or feet and then use a file or pumice stone to soften your corn or callus. Do this as told by your health care provider.  Check your corn or callus every day for symptoms of infection. Contact a health care provider if you:  Notice that your symptoms do not improve with treatment.  Have redness or swelling that gets worse.  Notice that your corn or callus becomes painful.  Have fluid, blood, or pus coming from your corn or callus.  Have new symptoms. Summary  Corns are small areas of thickened skin that occur on the top, sides, or tip of a toe.  Calluses are areas of thickened skin that can occur anywhere on the body, including the hands, fingers, palms, and soles of the feet. Calluses are usually larger than corns.  Corns and calluses are caused by   rubbing (friction) or pressure, such as from shoes that are too tight or do not fit properly.  Treatment may include wearing any padding, protective layers, gloves, or orthotics as told by your health care provider. This information is not intended to replace advice given to you by your health care provider. Make sure you discuss any questions you have with your health care provider. Document Released: 01/31/2004 Document Revised: 03/09/2017 Document Reviewed: 03/09/2017 Elsevier Interactive Patient Education   2019 [employer]  Onychomycosis/Fungal Toenails  WHAT IS IT? An infection that lies within the keratin of your nail plate that is caused by a fungus.  WHY ME? Fungal infections affect all ages, sexes, races, and creeds.  There may be many factors that predispose you to a fungal infection such as age, coexisting medical conditions such as diabetes, or an autoimmune disease; stress, medications, fatigue, genetics, etc.  Bottom line: fungus thrives in a warm, moist environment and your shoes offer such a location.  IS IT CONTAGIOUS? Theoretically, yes.  You do not want to share shoes, nail clippers or files with someone who has fungal toenails.  Walking around barefoot in the same room or sleeping in the same bed is unlikely to transfer the organism.  It is important to realize, however, that fungus can spread easily from one nail to the next on the same foot.  HOW DO WE TREAT THIS?  There are several ways to treat this condition.  Treatment may depend on many factors such as age, medications, pregnancy, liver and kidney conditions, etc.  It is best to ask your doctor which options are available to you.  1. No treatment.   Unlike many other medical concerns, you can live with this condition.  However for many people this can be a painful condition and may lead to ingrown toenails or a bacterial infection.  It is recommended that you keep the nails cut short to help reduce the amount of fungal nail. 2. Topical treatment.  These range from herbal remedies to prescription strength nail lacquers.  About 40-50% effective, topicals require twice daily application for approximately 9 to 12 months or until an entirely new nail has grown out.  The most effective topicals are medical grade medications available through physicians offices. 3. Oral antifungal medications.  With an 80-90% cure rate, the most common oral medication requires 3 to 4 months of therapy and stays in your system for a year as the new nail  grows out.  Oral antifungal medications do require blood work to make sure it is a safe drug for you.  A liver function panel will be performed prior to starting the medication and after the first month of treatment.  It is important to have the blood work performed to avoid any harmful side effects.  In general, this medication safe but blood work is required. 4. Laser Therapy.  This treatment is performed by applying a specialized laser to the affected nail plate.  This therapy is noninvasive, fast, and non-painful.  It is not covered by insurance and is therefore, out of pocket.  The results have been very good with a 80-95% cure rate.  The [facility] is the only practice in the area to offer this therapy. 5. Permanent Nail Avulsion.  Removing the entire nail so that a new nail will not grow back. 

## 2018-10-19 NOTE — Progress Notes (Signed)
Subjective: Cassidy Ellison is a 85 y.o. y.o. female who is on long term blood thinner Plavix and presents today for preventative foot care with painful, discolored, thick toenails and digital corns which interfere with daily activities. Pain is aggravated when wearing enclosed shoe gear. Pain is relieved with periodic professional debridement.  Harlan Stains, MD is her PCP and last visit was 3 months ago per patient recall.   Current Outpatient Medications:  .  amLODipine (NORVASC) 5 MG tablet, Take 5 mg by mouth daily. , Disp: , Rfl:  .  atorvastatin (LIPITOR) 20 MG tablet, Take 20 mg by mouth daily. , Disp: , Rfl:  .  betamethasone dipropionate (DIPROLENE) 0.05 % ointment, Apply 1 application topically 2 (two) times daily. , Disp: , Rfl: 2 .  Calcium Carbonate (CALCIUM 600 PO), Take 600 mg by mouth daily., Disp: , Rfl:  .  Cholecalciferol (VITAMIN D-3) 1000 units CAPS, Take 1,000 Units by mouth daily., Disp: , Rfl:  .  clopidogrel (PLAVIX) 75 MG tablet, Take 1 tablet (75 mg total) by mouth daily., Disp: 90 tablet, Rfl: 3 .  fexofenadine (ALLEGRA) 180 MG tablet, Take 180 mg by mouth daily., Disp: , Rfl:  .  fluticasone (FLONASE) 50 MCG/ACT nasal spray, Place 2 sprays into both nostrils daily., Disp: 16 g, Rfl: 5 .  losartan (COZAAR) 100 MG tablet, Take 100 mg by mouth daily., Disp: , Rfl:  .  metoprolol succinate (TOPROL-XL) 50 MG 24 hr tablet, Take 1 tablet (50 mg total) by mouth daily., Disp: 30 tablet, Rfl: 3 .  Multiple Vitamins-Minerals (MULTIVITAMIN WITH MINERALS) tablet, Take 1 tablet by mouth daily. , Disp: , Rfl:  .  rOPINIRole (REQUIP) 1 MG tablet, Take 1 mg by mouth at bedtime., Disp: , Rfl:  .  vitamin C (ASCORBIC ACID) 500 MG tablet, Take 500 mg by mouth daily., Disp: , Rfl:    Allergies  Allergen Reactions  . Corticosteroids Other (See Comments)    Caused patient to have low sodium levels  . Adhesive [Tape] Itching  . Chlorthalidone Other (See Comments)    creatinine   increases  . Hydrochlorothiazide Other (See Comments)    CREATININE INCREASES  . Oxsoralen [Methoxsalen]      Objective: Vitals:   10/16/18 0958  Temp: (!) 96.3 F (35.7 C)    Vascular Examination: Capillary refill time immediate x 10 digits.   Dorsalis pedis pulses 2/4 b/l.  Posterior tibial pulses 2/4 b/l.  No digital hair x 10 digits.  Skin temperature gradient WNL b/l.  Dermatological Examination: Skin with normal turgor, texture and tone b/l.  Toenails 1-5 b/l discolored, thick, dystrophic with subungual debris and pain with palpation to nailbeds due to thickness of nails.  Hyperkeratotic lesion distal tip left 2nd, bilateral 3rd digits with tenderness to palpation. No erythema, no edema, no drainage, no flocculence noted.    Musculoskeletal: Muscle strength 5/5 to all LE muscle groups.  Hammertoe deformity 2-5 b/l.  Neurological: Sensation intact with 10 gram monofilament.  Vibratory sensation intact.  Assessment: Painful onychomycosis toenails 1-5 b/l in patient on blood thinner.  Digital corns left 2nd digit, bilateral 3rd digits Hammertoe deformity 2-5 Patient on long term blood thinner, Plavix  Plan: 1. Toenails 1-5 b/l were debrided in length and girth without iatrogenic bleeding. 2. Hyperkeratotic lesion(s) left 2nd, bilateral 3rd digits debrided utilizing sterile scalpel blade without incident.  3. Patient to continue soft, supportive shoe gear daily. 4. Patient to report any pedal injuries to medical professional immediately.  5. Avoid self trimming due to use of blood thinner. 6. Follow up 9 weeks. 7. Patient/POA to call should there be a concern in the interim.

## 2018-10-30 DIAGNOSIS — L218 Other seborrheic dermatitis: Secondary | ICD-10-CM | POA: Diagnosis not present

## 2018-10-30 DIAGNOSIS — D0439 Carcinoma in situ of skin of other parts of face: Secondary | ICD-10-CM | POA: Diagnosis not present

## 2018-10-30 DIAGNOSIS — Z85828 Personal history of other malignant neoplasm of skin: Secondary | ICD-10-CM | POA: Diagnosis not present

## 2018-10-30 DIAGNOSIS — D485 Neoplasm of uncertain behavior of skin: Secondary | ICD-10-CM | POA: Diagnosis not present

## 2018-12-11 DIAGNOSIS — H04123 Dry eye syndrome of bilateral lacrimal glands: Secondary | ICD-10-CM | POA: Diagnosis not present

## 2018-12-11 DIAGNOSIS — H1859 Other hereditary corneal dystrophies: Secondary | ICD-10-CM | POA: Diagnosis not present

## 2018-12-11 DIAGNOSIS — H524 Presbyopia: Secondary | ICD-10-CM | POA: Diagnosis not present

## 2018-12-18 ENCOUNTER — Ambulatory Visit: Payer: Medicare Other | Admitting: Podiatry

## 2018-12-29 ENCOUNTER — Other Ambulatory Visit: Payer: Self-pay

## 2018-12-29 ENCOUNTER — Encounter: Payer: Self-pay | Admitting: Podiatry

## 2018-12-29 ENCOUNTER — Ambulatory Visit (INDEPENDENT_AMBULATORY_CARE_PROVIDER_SITE_OTHER): Payer: Medicare Other | Admitting: Podiatry

## 2018-12-29 VITALS — Temp 94.1°F

## 2018-12-29 DIAGNOSIS — L84 Corns and callosities: Secondary | ICD-10-CM | POA: Diagnosis not present

## 2018-12-29 DIAGNOSIS — M79675 Pain in left toe(s): Secondary | ICD-10-CM

## 2018-12-29 DIAGNOSIS — M2042 Other hammer toe(s) (acquired), left foot: Secondary | ICD-10-CM

## 2018-12-29 DIAGNOSIS — M79674 Pain in right toe(s): Secondary | ICD-10-CM | POA: Diagnosis not present

## 2018-12-29 DIAGNOSIS — Z9229 Personal history of other drug therapy: Secondary | ICD-10-CM

## 2018-12-29 DIAGNOSIS — B351 Tinea unguium: Secondary | ICD-10-CM

## 2018-12-29 NOTE — Progress Notes (Signed)
Subjective: Cassidy Ellison is a 86 y.o. y.o. female who is on long term blood thinner Plavix and presents today with painful, discolored, thick toenails which interfere with daily activities. Pain is aggravated when wearing enclosed shoe gear. Pain is relieved with periodic professional debridement.  Pt also presents with painful lesions on distal aspect of lesser digits; left 3rd digit most symptomatic.  She resides at [facility].   Harlan Stains, MD is her PCP.   Current Outpatient Medications:  .  alendronate (FOSAMAX) 70 MG tablet, , Disp: , Rfl:  .  amLODipine (NORVASC) 5 MG tablet, Take 5 mg by mouth daily. , Disp: , Rfl:  .  atorvastatin (LIPITOR) 20 MG tablet, Take 20 mg by mouth daily. , Disp: , Rfl:  .  betamethasone dipropionate (DIPROLENE) 0.05 % ointment, Apply 1 application topically 2 (two) times daily. , Disp: , Rfl: 2 .  Calcium Carbonate (CALCIUM 600 PO), Take 600 mg by mouth daily., Disp: , Rfl:  .  Cholecalciferol (VITAMIN D-3) 1000 units CAPS, Take 1,000 Units by mouth daily., Disp: , Rfl:  .  clopidogrel (PLAVIX) 75 MG tablet, Take 1 tablet (75 mg total) by mouth daily., Disp: 90 tablet, Rfl: 3 .  fexofenadine (ALLEGRA) 180 MG tablet, Take 180 mg by mouth daily., Disp: , Rfl:  .  fluticasone (FLONASE) 50 MCG/ACT nasal spray, Place 2 sprays into both nostrils daily., Disp: 16 g, Rfl: 5 .  losartan (COZAAR) 100 MG tablet, Take 100 mg by mouth daily., Disp: , Rfl:  .  metoprolol succinate (TOPROL-XL) 50 MG 24 hr tablet, Take 1 tablet (50 mg total) by mouth daily., Disp: 30 tablet, Rfl: 3 .  Multiple Vitamins-Minerals (MULTIVITAMIN WITH MINERALS) tablet, Take 1 tablet by mouth daily. , Disp: , Rfl:  .  pantoprazole (PROTONIX) 40 MG tablet, , Disp: , Rfl:  .  rOPINIRole (REQUIP) 1 MG tablet, Take 1 mg by mouth at bedtime., Disp: , Rfl:  .  vitamin C (ASCORBIC ACID) 500 MG tablet, Take 500 mg by mouth daily., Disp: , Rfl:    Allergies  Allergen Reactions   . Corticosteroids Other (See Comments)    Caused patient to have low sodium levels  . Adhesive [Tape] Itching  . Chlorthalidone Other (See Comments)    creatinine  increases  . Hydrochlorothiazide Other (See Comments)    CREATININE INCREASES  . Oxsoralen [Methoxsalen]      Objective: Vitals:   12/29/18 0841  Temp: (!) 94.1 F (34.5 C)   Physical Examination essentially unchanged on today's visit.  Vascular Examination: Capillary refill time immediate x 10 digits.  Dorsalis pedis pulses and Posterior tibial pulses palpable b/l.  No digital hair x 10 digits.  Skin temperature gradient WNL b/l.  Dermatological Examination: Skin with normal turgor, texture and and tone b/l.  Toenails 1-5 b/l discolored, thick, dystrophic with subungual debris and pain with palpation to nailbeds due to thickness of nails.  Hyperkeratotic lesion distal tip 3rd digit b/l, left 2nd digit, b/l hallux IPJ b/l, submet head 1 b/l. No erythema, no edema, no drainage, no flocculence noted.    Musculoskeletal: Muscle strength 5/5 b/l  to all LE muscle groups.  Hammertoes 2-5 b/l.  Neurological: Sensation intact 5/5 b/l with 10 gram monofilament.  Vibratory sensation intact b/l.  Assessment: Painful onychomycosis toenails 1-5 b/l in patient on blood thinner. Corns b/l 3rd, left 2nd digit Calluses b/l hallux, submet head 1 b/l Patient on long term blood thinner   Plan: 1.  Toenails 1-5 b/l were debrided in length and girth without iatrogenic bleeding. 2. Hyperkeratotic lesions Corns b/l 3rd, left 2nd, b/l hallux, submet head 1 b/l debrided utilizing sterile scalpel blade without incident.  3. Patient to continue soft, supportive shoe gear daily. 4. Patient to report any pedal injuries to medical professional immediately. 5. Avoid self trimming due to use of blood thinner. 6. Follow up 9 weeks. 7. Patient/POA to call should there be a concern in the interim.

## 2018-12-29 NOTE — Patient Instructions (Signed)
Diabetes Mellitus and Foot Care Foot care is an important part of your health, especially when you have diabetes. Diabetes may cause you to have problems because of poor blood flow (circulation) to your feet and legs, which can cause your skin to:  Become thinner and drier.  Break more easily.  Heal more slowly.  Peel and crack. You may also have nerve damage (neuropathy) in your legs and feet, causing decreased feeling in them. This means that you may not notice minor injuries to your feet that could lead to more serious problems. Noticing and addressing any potential problems early is the best way to prevent future foot problems. How to care for your feet Foot hygiene  Wash your feet daily with warm water and mild soap. Do not use hot water. Then, pat your feet and the areas between your toes until they are completely dry. Do not soak your feet as this can dry your skin.  Trim your toenails straight across. Do not dig under them or around the cuticle. File the edges of your nails with an emery board or nail file.  Apply a moisturizing lotion or petroleum jelly to the skin on your feet and to dry, brittle toenails. Use lotion that does not contain alcohol and is unscented. Do not apply lotion between your toes. Shoes and socks  Wear clean socks or stockings every day. Make sure they are not too tight. Do not wear knee-high stockings since they may decrease blood flow to your legs.  Wear shoes that fit properly and have enough cushioning. Always look in your shoes before you put them on to be sure there are no objects inside.  To break in new shoes, wear them for just a few hours a day. This prevents injuries on your feet. Wounds, scrapes, corns, and calluses  Check your feet daily for blisters, cuts, bruises, sores, and redness. If you cannot see the bottom of your feet, use a mirror or ask someone for help.  Do not cut corns or calluses or try to remove them with medicine.  If you  find a minor scrape, cut, or break in the skin on your feet, keep it and the skin around it clean and dry. You may clean these areas with mild soap and water. Do not clean the area with peroxide, alcohol, or iodine.  If you have a wound, scrape, corn, or callus on your foot, look at it several times a day to make sure it is healing and not infected. Check for: ? Redness, swelling, or pain. ? Fluid or blood. ? Warmth. ? Pus or a bad smell. General instructions  Do not cross your legs. This may decrease blood flow to your feet.  Do not use heating pads or hot water bottles on your feet. They may burn your skin. If you have lost feeling in your feet or legs, you may not know this is happening until it is too late.  Protect your feet from hot and cold by wearing shoes, such as at the beach or on hot pavement.  Schedule a complete foot exam at least once a year (annually) or more often if you have foot problems. If you have foot problems, report any cuts, sores, or bruises to your health care provider immediately. Contact a health care provider if:  You have a medical condition that increases your risk of infection and you have any cuts, sores, or bruises on your feet.  You have an injury that is not   healing.  You have redness on your legs or feet.  You feel burning or tingling in your legs or feet.  You have pain or cramps in your legs and feet.  Your legs or feet are numb.  Your feet always feel cold.  You have pain around a toenail. Get help right away if:  You have a wound, scrape, corn, or callus on your foot and: ? You have pain, swelling, or redness that gets worse. ? You have fluid or blood coming from the wound, scrape, corn, or callus. ? Your wound, scrape, corn, or callus feels warm to the touch. ? You have pus or a bad smell coming from the wound, scrape, corn, or callus. ? You have a fever. ? You have a red line going up your leg. Summary  Check your feet every day  for cuts, sores, red spots, swelling, and blisters.  Moisturize feet and legs daily.  Wear shoes that fit properly and have enough cushioning.  If you have foot problems, report any cuts, sores, or bruises to your health care provider immediately.  Schedule a complete foot exam at least once a year (annually) or more often if you have foot problems. This information is not intended to replace advice given to you by your health care provider. Make sure you discuss any questions you have with your health care provider. Document Released: 04/23/2000 Document Revised: 06/08/2017 Document Reviewed: 05/28/2016 Elsevier Patient Education  2020 [employer]   Onychomycosis/Fungal Toenails  WHAT IS IT? An infection that lies within the keratin of your nail plate that is caused by a fungus.  WHY ME? Fungal infections affect all ages, sexes, races, and creeds.  There may be many factors that predispose you to a fungal infection such as age, coexisting medical conditions such as diabetes, or an autoimmune disease; stress, medications, fatigue, genetics, etc.  Bottom line: fungus thrives in a warm, moist environment and your shoes offer such a location.  IS IT CONTAGIOUS? Theoretically, yes.  You do not want to share shoes, nail clippers or files with someone who has fungal toenails.  Walking around barefoot in the same room or sleeping in the same bed is unlikely to transfer the organism.  It is important to realize, however, that fungus can spread easily from one nail to the next on the same foot.  HOW DO WE TREAT THIS?  There are several ways to treat this condition.  Treatment may depend on many factors such as age, medications, pregnancy, liver and kidney conditions, etc.  It is best to ask your doctor which options are available to you.  1. No treatment.   Unlike many other medical concerns, you can live with this condition.  However for many people this can be a painful condition and may lead to  ingrown toenails or a bacterial infection.  It is recommended that you keep the nails cut short to help reduce the amount of fungal nail. 2. Topical treatment.  These range from herbal remedies to prescription strength nail lacquers.  About 40-50% effective, topicals require twice daily application for approximately 9 to 12 months or until an entirely new nail has grown out.  The most effective topicals are medical grade medications available through physicians offices. 3. Oral antifungal medications.  With an 80-90% cure rate, the most common oral medication requires 3 to 4 months of therapy and stays in your system for a year as the new nail grows out.  Oral antifungal medications do require   blood work to make sure it is a safe drug for you.  A liver function panel will be performed prior to starting the medication and after the first month of treatment.  It is important to have the blood work performed to avoid any harmful side effects.  In general, this medication safe but blood work is required. 4. Laser Therapy.  This treatment is performed by applying a specialized laser to the affected nail plate.  This therapy is noninvasive, fast, and non-painful.  It is not covered by insurance and is therefore, out of pocket.  The results have been very good with a 80-95% cure rate.  The [facility] is the only practice in the area to offer this therapy. 5. Permanent Nail Avulsion.  Removing the entire nail so that a new nail will not grow back. 

## 2019-01-17 DIAGNOSIS — Z85828 Personal history of other malignant neoplasm of skin: Secondary | ICD-10-CM | POA: Diagnosis not present

## 2019-01-22 DIAGNOSIS — E559 Vitamin D deficiency, unspecified: Secondary | ICD-10-CM | POA: Diagnosis not present

## 2019-01-22 DIAGNOSIS — G2581 Restless legs syndrome: Secondary | ICD-10-CM | POA: Diagnosis not present

## 2019-01-22 DIAGNOSIS — E785 Hyperlipidemia, unspecified: Secondary | ICD-10-CM | POA: Diagnosis not present

## 2019-01-22 DIAGNOSIS — I10 Essential (primary) hypertension: Secondary | ICD-10-CM | POA: Diagnosis not present

## 2019-01-22 DIAGNOSIS — Z79899 Other long term (current) drug therapy: Secondary | ICD-10-CM | POA: Diagnosis not present

## 2019-01-22 DIAGNOSIS — R202 Paresthesia of skin: Secondary | ICD-10-CM | POA: Diagnosis not present

## 2019-01-22 DIAGNOSIS — Z8673 Personal history of transient ischemic attack (TIA), and cerebral infarction without residual deficits: Secondary | ICD-10-CM | POA: Diagnosis not present

## 2019-01-22 DIAGNOSIS — Z23 Encounter for immunization: Secondary | ICD-10-CM | POA: Diagnosis not present

## 2019-01-22 DIAGNOSIS — E871 Hypo-osmolality and hyponatremia: Secondary | ICD-10-CM | POA: Diagnosis not present

## 2019-03-07 ENCOUNTER — Ambulatory Visit (INDEPENDENT_AMBULATORY_CARE_PROVIDER_SITE_OTHER): Payer: Medicare Other | Admitting: Podiatry

## 2019-03-07 ENCOUNTER — Encounter: Payer: Self-pay | Admitting: Podiatry

## 2019-03-07 ENCOUNTER — Other Ambulatory Visit: Payer: Self-pay

## 2019-03-07 DIAGNOSIS — B351 Tinea unguium: Secondary | ICD-10-CM

## 2019-03-07 DIAGNOSIS — Z9229 Personal history of other drug therapy: Secondary | ICD-10-CM

## 2019-03-07 DIAGNOSIS — M79675 Pain in left toe(s): Secondary | ICD-10-CM

## 2019-03-07 DIAGNOSIS — L84 Corns and callosities: Secondary | ICD-10-CM

## 2019-03-07 DIAGNOSIS — M79674 Pain in right toe(s): Secondary | ICD-10-CM

## 2019-03-07 NOTE — Patient Instructions (Signed)
Diabetes Mellitus and Foot Care Foot care is an important part of your health, especially when you have diabetes. Diabetes may cause you to have problems because of poor blood flow (circulation) to your feet and legs, which can cause your skin to:  Become thinner and drier.  Break more easily.  Heal more slowly.  Peel and crack. You may also have nerve damage (neuropathy) in your legs and feet, causing decreased feeling in them. This means that you may not notice minor injuries to your feet that could lead to more serious problems. Noticing and addressing any potential problems early is the best way to prevent future foot problems. How to care for your feet Foot hygiene  Wash your feet daily with warm water and mild soap. Do not use hot water. Then, pat your feet and the areas between your toes until they are completely dry. Do not soak your feet as this can dry your skin.  Trim your toenails straight across. Do not dig under them or around the cuticle. File the edges of your nails with an emery board or nail file.  Apply a moisturizing lotion or petroleum jelly to the skin on your feet and to dry, brittle toenails. Use lotion that does not contain alcohol and is unscented. Do not apply lotion between your toes. Shoes and socks  Wear clean socks or stockings every day. Make sure they are not too tight. Do not wear knee-high stockings since they may decrease blood flow to your legs.  Wear shoes that fit properly and have enough cushioning. Always look in your shoes before you put them on to be sure there are no objects inside.  To break in new shoes, wear them for just a few hours a day. This prevents injuries on your feet. Wounds, scrapes, corns, and calluses  Check your feet daily for blisters, cuts, bruises, sores, and redness. If you cannot see the bottom of your feet, use a mirror or ask someone for help.  Do not cut corns or calluses or try to remove them with medicine.  If you  find a minor scrape, cut, or break in the skin on your feet, keep it and the skin around it clean and dry. You may clean these areas with mild soap and water. Do not clean the area with peroxide, alcohol, or iodine.  If you have a wound, scrape, corn, or callus on your foot, look at it several times a day to make sure it is healing and not infected. Check for: ? Redness, swelling, or pain. ? Fluid or blood. ? Warmth. ? Pus or a bad smell. General instructions  Do not cross your legs. This may decrease blood flow to your feet.  Do not use heating pads or hot water bottles on your feet. They may burn your skin. If you have lost feeling in your feet or legs, you may not know this is happening until it is too late.  Protect your feet from hot and cold by wearing shoes, such as at the beach or on hot pavement.  Schedule a complete foot exam at least once a year (annually) or more often if you have foot problems. If you have foot problems, report any cuts, sores, or bruises to your health care provider immediately. Contact a health care provider if:  You have a medical condition that increases your risk of infection and you have any cuts, sores, or bruises on your feet.  You have an injury that is not   healing.  You have redness on your legs or feet.  You feel burning or tingling in your legs or feet.  You have pain or cramps in your legs and feet.  Your legs or feet are numb.  Your feet always feel cold.  You have pain around a toenail. Get help right away if:  You have a wound, scrape, corn, or callus on your foot and: ? You have pain, swelling, or redness that gets worse. ? You have fluid or blood coming from the wound, scrape, corn, or callus. ? Your wound, scrape, corn, or callus feels warm to the touch. ? You have pus or a bad smell coming from the wound, scrape, corn, or callus. ? You have a fever. ? You have a red line going up your leg. Summary  Check your feet every day  for cuts, sores, red spots, swelling, and blisters.  Moisturize feet and legs daily.  Wear shoes that fit properly and have enough cushioning.  If you have foot problems, report any cuts, sores, or bruises to your health care provider immediately.  Schedule a complete foot exam at least once a year (annually) or more often if you have foot problems. This information is not intended to replace advice given to you by your health care provider. Make sure you discuss any questions you have with your health care provider. Document Released: 04/23/2000 Document Revised: 06/08/2017 Document Reviewed: 05/28/2016 Elsevier Patient Education  2020 [employer] Inc[employer]  

## 2019-03-07 NOTE — Progress Notes (Signed)
Subjective:  Cassidy Ellison presents to clinic today with cc of  Painful corns and calluses and thick, discolored, elongated toenails 1-5 b/l that become tender and cannot cut because of thickness. She is on long term blood thinner, Plavix.  Her left 3rd digit is most symptomatic on today.  Pain is aggravated when wearing enclosed shoe gear and relieved with periodic professional debridement.  Harlan Stains, MD is her PCP.   Current Outpatient Medications on File Prior to Visit  Medication Sig Dispense Refill  . alendronate (FOSAMAX) 70 MG tablet     . amLODipine (NORVASC) 5 MG tablet Take 5 mg by mouth daily.     Marland Kitchen atorvastatin (LIPITOR) 20 MG tablet Take 20 mg by mouth daily.     . betamethasone dipropionate (DIPROLENE) 0.05 % ointment Apply 1 application topically 2 (two) times daily.   2  . Calcium Carbonate (CALCIUM 600 PO) Take 600 mg by mouth daily.    . Cholecalciferol (VITAMIN D-3) 1000 units CAPS Take 1,000 Units by mouth daily.    . clopidogrel (PLAVIX) 75 MG tablet Take 1 tablet (75 mg total) by mouth daily. 90 tablet 3  . fexofenadine (ALLEGRA) 180 MG tablet Take 180 mg by mouth daily.    . fluticasone (FLONASE) 50 MCG/ACT nasal spray Place 2 sprays into both nostrils daily. 16 g 5  . losartan (COZAAR) 100 MG tablet Take 100 mg by mouth daily.    . metoprolol succinate (TOPROL-XL) 50 MG 24 hr tablet Take 1 tablet (50 mg total) by mouth daily. 30 tablet 3  . Multiple Vitamins-Minerals (MULTIVITAMIN WITH MINERALS) tablet Take 1 tablet by mouth daily.     . pantoprazole (PROTONIX) 40 MG tablet     . rOPINIRole (REQUIP) 1 MG tablet Take 1 mg by mouth at bedtime.    . vitamin C (ASCORBIC ACID) 500 MG tablet Take 500 mg by mouth daily.     No current facility-administered medications on file prior to visit.      Allergies  Allergen Reactions  . Corticosteroids Other (See Comments)    Caused patient to have low sodium levels  . Adhesive [Tape] Itching  . Chlorthalidone Other  (See Comments)    creatinine  increases  . Hydrochlorothiazide Other (See Comments)    CREATININE INCREASES  . Oxsoralen [Methoxsalen]     Objective:  Physical Examination:  Vascular Examination: Capillary refill time immediate x 10 digits.  Palpable DP/PT pulses b/l.  Digital hair absent b/l.  No edema noted b/l.  Skin temperature gradient WNL b/l.  Dermatological Examination: Skin with normal turgor, texture and tone b/l.  No open wounds b/l.  No interdigital macerations noted b/l.  Elongated, thick, discolored brittle toenails with subungual debris and pain on dorsal palpation of nailbeds 1-5 b/l.  Hyperkeratotic lesions distal tip 3rd digit b/l, distal tip left 2nd digit,  submet head 1 left foot and b/l hallux with tenderness to palpation. No edema, no erythema, no drainage, no flocculence.  Musculoskeletal Examination: Muscle strength 5/5 to all muscle groups b/l.  Rigid hammertoe deformity 2-5 b/l with overlapping 2nd digit left foot.  No pain, crepitus or joint discomfort with active/passive ROM.  Neurological Examination: Sensation intact 5/5 b/l with 10 gram monofilament.  Assessment: Mycotic nail infection with pain 1-5 b/l Corns b/l 3rd, left 2nd digit Calluses submet head 1 left foot and b/l hallux Pt on long term blood thinner  Plan: 1. Toenails 1-5 b/l were debrided in length and girth without iatrogenic laceration.  2. Calluses pared submetatarsal head 1 left foot, b/l hallux utilizing sterile scalpel blade without incident. Corn(s) pared b/l 3rd and left 2nd digit utilizing sterile scalpel blade without incident. 3. Continue soft, supportive shoe gear daily. 4.   Report any pedal injuries to medical professional. 5.   Follow up 9 weeks. 6.   Patient/POA to call should there be a question/concern in there interim.

## 2019-04-21 DIAGNOSIS — Z20828 Contact with and (suspected) exposure to other viral communicable diseases: Secondary | ICD-10-CM | POA: Diagnosis not present

## 2019-04-21 DIAGNOSIS — Z1159 Encounter for screening for other viral diseases: Secondary | ICD-10-CM | POA: Diagnosis not present

## 2019-04-30 DIAGNOSIS — Z20828 Contact with and (suspected) exposure to other viral communicable diseases: Secondary | ICD-10-CM | POA: Diagnosis not present

## 2019-04-30 DIAGNOSIS — Z1159 Encounter for screening for other viral diseases: Secondary | ICD-10-CM | POA: Diagnosis not present

## 2019-05-07 DIAGNOSIS — Z1159 Encounter for screening for other viral diseases: Secondary | ICD-10-CM | POA: Diagnosis not present

## 2019-05-07 DIAGNOSIS — Z20828 Contact with and (suspected) exposure to other viral communicable diseases: Secondary | ICD-10-CM | POA: Diagnosis not present

## 2019-05-14 DIAGNOSIS — Z1159 Encounter for screening for other viral diseases: Secondary | ICD-10-CM | POA: Diagnosis not present

## 2019-05-14 DIAGNOSIS — Z20828 Contact with and (suspected) exposure to other viral communicable diseases: Secondary | ICD-10-CM | POA: Diagnosis not present

## 2019-05-17 DIAGNOSIS — Z20828 Contact with and (suspected) exposure to other viral communicable diseases: Secondary | ICD-10-CM | POA: Diagnosis not present

## 2019-05-17 DIAGNOSIS — Z1159 Encounter for screening for other viral diseases: Secondary | ICD-10-CM | POA: Diagnosis not present

## 2019-05-21 DIAGNOSIS — Z1159 Encounter for screening for other viral diseases: Secondary | ICD-10-CM | POA: Diagnosis not present

## 2019-05-21 DIAGNOSIS — Z20828 Contact with and (suspected) exposure to other viral communicable diseases: Secondary | ICD-10-CM | POA: Diagnosis not present

## 2019-05-24 DIAGNOSIS — Z1159 Encounter for screening for other viral diseases: Secondary | ICD-10-CM | POA: Diagnosis not present

## 2019-05-24 DIAGNOSIS — Z20828 Contact with and (suspected) exposure to other viral communicable diseases: Secondary | ICD-10-CM | POA: Diagnosis not present

## 2019-05-28 DIAGNOSIS — B351 Tinea unguium: Secondary | ICD-10-CM | POA: Diagnosis not present

## 2019-05-28 DIAGNOSIS — Z1159 Encounter for screening for other viral diseases: Secondary | ICD-10-CM | POA: Diagnosis not present

## 2019-05-28 DIAGNOSIS — Z20828 Contact with and (suspected) exposure to other viral communicable diseases: Secondary | ICD-10-CM | POA: Diagnosis not present

## 2019-05-28 DIAGNOSIS — M79674 Pain in right toe(s): Secondary | ICD-10-CM | POA: Diagnosis not present

## 2019-05-28 DIAGNOSIS — M79675 Pain in left toe(s): Secondary | ICD-10-CM | POA: Diagnosis not present

## 2019-05-31 DIAGNOSIS — Z23 Encounter for immunization: Secondary | ICD-10-CM | POA: Diagnosis not present

## 2019-06-04 DIAGNOSIS — Z20828 Contact with and (suspected) exposure to other viral communicable diseases: Secondary | ICD-10-CM | POA: Diagnosis not present

## 2019-06-04 DIAGNOSIS — Z1159 Encounter for screening for other viral diseases: Secondary | ICD-10-CM | POA: Diagnosis not present

## 2019-06-06 ENCOUNTER — Ambulatory Visit: Payer: Medicare Other | Admitting: Podiatry

## 2019-06-11 DIAGNOSIS — Z20828 Contact with and (suspected) exposure to other viral communicable diseases: Secondary | ICD-10-CM | POA: Diagnosis not present

## 2019-06-11 DIAGNOSIS — Z1159 Encounter for screening for other viral diseases: Secondary | ICD-10-CM | POA: Diagnosis not present

## 2019-06-12 ENCOUNTER — Ambulatory Visit: Payer: Medicare Other | Admitting: Podiatry

## 2019-06-18 DIAGNOSIS — Z20828 Contact with and (suspected) exposure to other viral communicable diseases: Secondary | ICD-10-CM | POA: Diagnosis not present

## 2019-06-18 DIAGNOSIS — Z1159 Encounter for screening for other viral diseases: Secondary | ICD-10-CM | POA: Diagnosis not present

## 2019-06-25 DIAGNOSIS — Z20828 Contact with and (suspected) exposure to other viral communicable diseases: Secondary | ICD-10-CM | POA: Diagnosis not present

## 2019-06-25 DIAGNOSIS — Z1159 Encounter for screening for other viral diseases: Secondary | ICD-10-CM | POA: Diagnosis not present

## 2019-06-28 DIAGNOSIS — Z23 Encounter for immunization: Secondary | ICD-10-CM | POA: Diagnosis not present

## 2019-07-02 DIAGNOSIS — Z20828 Contact with and (suspected) exposure to other viral communicable diseases: Secondary | ICD-10-CM | POA: Diagnosis not present

## 2019-07-02 DIAGNOSIS — Z1159 Encounter for screening for other viral diseases: Secondary | ICD-10-CM | POA: Diagnosis not present

## 2019-07-09 DIAGNOSIS — Z1159 Encounter for screening for other viral diseases: Secondary | ICD-10-CM | POA: Diagnosis not present

## 2019-07-09 DIAGNOSIS — Z20828 Contact with and (suspected) exposure to other viral communicable diseases: Secondary | ICD-10-CM | POA: Diagnosis not present

## 2019-07-16 DIAGNOSIS — Z1159 Encounter for screening for other viral diseases: Secondary | ICD-10-CM | POA: Diagnosis not present

## 2019-07-16 DIAGNOSIS — Z20828 Contact with and (suspected) exposure to other viral communicable diseases: Secondary | ICD-10-CM | POA: Diagnosis not present

## 2019-07-18 DIAGNOSIS — Z8673 Personal history of transient ischemic attack (TIA), and cerebral infarction without residual deficits: Secondary | ICD-10-CM | POA: Diagnosis not present

## 2019-07-18 DIAGNOSIS — E785 Hyperlipidemia, unspecified: Secondary | ICD-10-CM | POA: Diagnosis not present

## 2019-07-18 DIAGNOSIS — G2581 Restless legs syndrome: Secondary | ICD-10-CM | POA: Diagnosis not present

## 2019-07-18 DIAGNOSIS — I700 Atherosclerosis of aorta: Secondary | ICD-10-CM | POA: Diagnosis not present

## 2019-07-18 DIAGNOSIS — I10 Essential (primary) hypertension: Secondary | ICD-10-CM | POA: Diagnosis not present

## 2019-07-18 DIAGNOSIS — K219 Gastro-esophageal reflux disease without esophagitis: Secondary | ICD-10-CM | POA: Diagnosis not present

## 2019-07-18 DIAGNOSIS — J3089 Other allergic rhinitis: Secondary | ICD-10-CM | POA: Diagnosis not present

## 2019-07-18 DIAGNOSIS — E559 Vitamin D deficiency, unspecified: Secondary | ICD-10-CM | POA: Diagnosis not present

## 2019-07-18 DIAGNOSIS — G43109 Migraine with aura, not intractable, without status migrainosus: Secondary | ICD-10-CM | POA: Diagnosis not present

## 2019-07-18 DIAGNOSIS — Z0000 Encounter for general adult medical examination without abnormal findings: Secondary | ICD-10-CM | POA: Diagnosis not present

## 2019-07-18 DIAGNOSIS — H6123 Impacted cerumen, bilateral: Secondary | ICD-10-CM | POA: Diagnosis not present

## 2019-07-18 DIAGNOSIS — M810 Age-related osteoporosis without current pathological fracture: Secondary | ICD-10-CM | POA: Diagnosis not present

## 2019-07-23 DIAGNOSIS — Z20828 Contact with and (suspected) exposure to other viral communicable diseases: Secondary | ICD-10-CM | POA: Diagnosis not present

## 2019-07-23 DIAGNOSIS — Z1159 Encounter for screening for other viral diseases: Secondary | ICD-10-CM | POA: Diagnosis not present

## 2019-07-30 DIAGNOSIS — Z20828 Contact with and (suspected) exposure to other viral communicable diseases: Secondary | ICD-10-CM | POA: Diagnosis not present

## 2019-07-30 DIAGNOSIS — Z1159 Encounter for screening for other viral diseases: Secondary | ICD-10-CM | POA: Diagnosis not present

## 2019-08-06 DIAGNOSIS — Z20828 Contact with and (suspected) exposure to other viral communicable diseases: Secondary | ICD-10-CM | POA: Diagnosis not present

## 2019-08-06 DIAGNOSIS — Z1159 Encounter for screening for other viral diseases: Secondary | ICD-10-CM | POA: Diagnosis not present

## 2019-08-13 DIAGNOSIS — Z1159 Encounter for screening for other viral diseases: Secondary | ICD-10-CM | POA: Diagnosis not present

## 2019-08-13 DIAGNOSIS — Z20828 Contact with and (suspected) exposure to other viral communicable diseases: Secondary | ICD-10-CM | POA: Diagnosis not present

## 2019-08-15 ENCOUNTER — Encounter: Payer: Self-pay | Admitting: Podiatry

## 2019-08-15 ENCOUNTER — Ambulatory Visit (INDEPENDENT_AMBULATORY_CARE_PROVIDER_SITE_OTHER): Payer: Medicare Other | Admitting: Podiatry

## 2019-08-15 ENCOUNTER — Other Ambulatory Visit: Payer: Self-pay

## 2019-08-15 VITALS — Temp 96.8°F

## 2019-08-15 DIAGNOSIS — L84 Corns and callosities: Secondary | ICD-10-CM

## 2019-08-15 DIAGNOSIS — M79674 Pain in right toe(s): Secondary | ICD-10-CM | POA: Diagnosis not present

## 2019-08-15 DIAGNOSIS — M79675 Pain in left toe(s): Secondary | ICD-10-CM

## 2019-08-15 DIAGNOSIS — D689 Coagulation defect, unspecified: Secondary | ICD-10-CM

## 2019-08-15 DIAGNOSIS — B351 Tinea unguium: Secondary | ICD-10-CM

## 2019-08-15 NOTE — Patient Instructions (Signed)
Corns and Calluses Corns are small areas of thickened skin that occur on the top, sides, or tip of a toe. They contain a cone-shaped core with a point that can press on a nerve below. This causes pain.  Calluses are areas of thickened skin that can occur anywhere on the body, including the hands, fingers, palms, soles of the feet, and heels. Calluses are usually larger than corns. What are the causes? Corns and calluses are caused by rubbing (friction) or pressure, such as from shoes that are too tight or do not fit properly. What increases the risk? Corns are more likely to develop in people who have misshapen toes (toe deformities), such as hammer toes. Calluses can occur with friction to any area of the skin. They are more likely to develop in people who:  Work with their hands.  Wear shoes that fit poorly, are too tight, or are high-heeled.  Have toe deformities. What are the signs or symptoms? Symptoms of a corn or callus include:  A hard growth on the skin.  Pain or tenderness under the skin.  Redness and swelling.  Increased discomfort while wearing tight-fitting shoes, if your feet are affected. If a corn or callus becomes infected, symptoms may include:  Redness and swelling that gets worse.  Pain.  Fluid, blood, or pus draining from the corn or callus. How is this diagnosed? Corns and calluses may be diagnosed based on your symptoms, your medical history, and a physical exam. How is this treated? Treatment for corns and calluses may include:  Removing the cause of the friction or pressure. This may involve: ? Changing your shoes. ? Wearing shoe inserts (orthotics) or other protective layers in your shoes, such as a corn pad. ? Wearing gloves.  Applying medicine to the skin (topical medicine) to help soften skin in the hardened, thickened areas.  Removing layers of dead skin with a file to reduce the size of the corn or callus.  Removing the corn or callus with a  scalpel or laser.  Taking antibiotic medicines, if your corn or callus is infected.  Having surgery, if a toe deformity is the cause. Follow these instructions at home:   Take over-the-counter and prescription medicines only as told by your health care provider.  If you were prescribed an antibiotic, take it as told by your health care provider. Do not stop taking it even if your condition starts to improve.  Wear shoes that fit well. Avoid wearing high-heeled shoes and shoes that are too tight or too loose.  Wear any padding, protective layers, gloves, or orthotics as told by your health care provider.  Soak your hands or feet and then use a file or pumice stone to soften your corn or callus. Do this as told by your health care provider.  Check your corn or callus every day for symptoms of infection. Contact a health care provider if you:  Notice that your symptoms do not improve with treatment.  Have redness or swelling that gets worse.  Notice that your corn or callus becomes painful.  Have fluid, blood, or pus coming from your corn or callus.  Have new symptoms. Summary  Corns are small areas of thickened skin that occur on the top, sides, or tip of a toe.  Calluses are areas of thickened skin that can occur anywhere on the body, including the hands, fingers, palms, and soles of the feet. Calluses are usually larger than corns.  Corns and calluses are caused by   rubbing (friction) or pressure, such as from shoes that are too tight or do not fit properly.  Treatment may include wearing any padding, protective layers, gloves, or orthotics as told by your health care provider. This information is not intended to replace advice given to you by your health care provider. Make sure you discuss any questions you have with your health care provider. Document Revised: 08/16/2018 Document Reviewed: 03/09/2017 Elsevier Patient Education  2020 [employer]  Onychomycosis/Fungal  Toenails  WHAT IS IT? An infection that lies within the keratin of your nail plate that is caused by a fungus.  WHY ME? Fungal infections affect all ages, sexes, races, and creeds.  There may be many factors that predispose you to a fungal infection such as age, coexisting medical conditions such as diabetes, or an autoimmune disease; stress, medications, fatigue, genetics, etc.  Bottom line: fungus thrives in a warm, moist environment and your shoes offer such a location.  IS IT CONTAGIOUS? Theoretically, yes.  You do not want to share shoes, nail clippers or files with someone who has fungal toenails.  Walking around barefoot in the same room or sleeping in the same bed is unlikely to transfer the organism.  It is important to realize, however, that fungus can spread easily from one nail to the next on the same foot.  HOW DO WE TREAT THIS?  There are several ways to treat this condition.  Treatment may depend on many factors such as age, medications, pregnancy, liver and kidney conditions, etc.  It is best to ask your doctor which options are available to you.  4. No treatment.   Unlike many other medical concerns, you can live with this condition.  However for many people this can be a painful condition and may lead to ingrown toenails or a bacterial infection.  It is recommended that you keep the nails cut short to help reduce the amount of fungal nail. 5. Topical treatment.  These range from herbal remedies to prescription strength nail lacquers.  About 40-50% effective, topicals require twice daily application for approximately 9 to 12 months or until an entirely new nail has grown out.  The most effective topicals are medical grade medications available through physicians offices. 6. Oral antifungal medications.  With an 80-90% cure rate, the most common oral medication requires 3 to 4 months of therapy and stays in your system for a year as the new nail grows out.  Oral antifungal medications do  require blood work to make sure it is a safe drug for you.  A liver function panel will be performed prior to starting the medication and after the first month of treatment.  It is important to have the blood work performed to avoid any harmful side effects.  In general, this medication safe but blood work is required. 7. Laser Therapy.  This treatment is performed by applying a specialized laser to the affected nail plate.  This therapy is noninvasive, fast, and non-painful.  It is not covered by insurance and is therefore, out of pocket.  The results have been very good with a 80-95% cure rate.  [facility] is the only practice in the area to offer this therapy. 8. Permanent Nail Avulsion.  Removing the entire nail so that a new nail will not grow back. 

## 2019-08-20 DIAGNOSIS — Z20828 Contact with and (suspected) exposure to other viral communicable diseases: Secondary | ICD-10-CM | POA: Diagnosis not present

## 2019-08-20 DIAGNOSIS — Z1159 Encounter for screening for other viral diseases: Secondary | ICD-10-CM | POA: Diagnosis not present

## 2019-08-20 NOTE — Progress Notes (Signed)
Subjective: Cassidy Ellison presents today for follow up of at risk foot care. Patient has history of clotting disorder with h/o DVT, CVA and TIA. She remains on Plavix daily. She has callus(es) b/l feet and painful mycotic toenails b/l that are difficult to trim. Pain interferes with ambulation. Aggravating factors include wearing enclosed shoe gear. Pain is relieved with periodic professional debridement.   Allergies  Allergen Reactions  . Corticosteroids Other (See Comments)    Caused patient to have low sodium levels  . Adhesive [Tape] Itching  . Chlorthalidone Other (See Comments)    creatinine  increases  . Hydrochlorothiazide Other (See Comments)    CREATININE INCREASES  . Oxsoralen [Methoxsalen]      Objective: Vitals:   08/15/19 1014  Temp: (!) 96.8 F (39 C)    Pt 86 y.o. year old Caucasian female  in NAD. AAO x 3.   Vascular Examination:  Capillary refill time to digits immediate b/l. Palpable DP pulses b/l. Palpable PT pulses b/l. Pedal hair absent b/l Skin temperature gradient within normal limits b/l. No edema noted b/l.  Dermatological Examination: Pedal skin with normal turgor, texture and tone bilaterally. No open wounds bilaterally. No interdigital macerations bilaterally. Toenails 1-5 b/l elongated, dystrophic, thickened, crumbly with subungual debris and tenderness to dorsal palpation. Hyperkeratotic lesion(s) L hallux, L 2nd toe, L 3rd toe, R hallux, R 3rd toe and submet head 1 left foot.  No erythema, no edema, no drainage, no flocculence.  Musculoskeletal: Normal muscle strength 5/5 to all lower extremity muscle groups bilaterally, no pain crepitus or joint limitation noted with ROM b/l and hammertoes noted to the  2-5 bilaterally  Neurological: Protective sensation intact 5/5 intact bilaterally with 10g monofilament b/l Vibratory sensation intact b/l Babinski reflex negative b/l Clonus negative b/l  Assessment: No diagnosis found.  Plan: -Toenails 1-5 b/l  were debrided in length and girth with sterile nail nippers and dremel without iatrogenic bleeding.  -Corn(s) debrided L 2nd toe, L 3rd toe and R 3rd toe without complication or incident. Total number debrided=3. -Callus(es) L hallux, R hallux and submet head 1 left foot were debrided without complication or incident. Total number debrided =3. -Patient to continue soft, supportive shoe gear daily. -Patient to report any pedal injuries to medical professional immediately. -Patient/POA to call should there be question/concern in the interim.  Return in about 3 months (around 11/14/2019) for nail and callus trim.

## 2019-08-27 DIAGNOSIS — Z1159 Encounter for screening for other viral diseases: Secondary | ICD-10-CM | POA: Diagnosis not present

## 2019-08-27 DIAGNOSIS — Z20828 Contact with and (suspected) exposure to other viral communicable diseases: Secondary | ICD-10-CM | POA: Diagnosis not present

## 2019-09-03 DIAGNOSIS — Z1159 Encounter for screening for other viral diseases: Secondary | ICD-10-CM | POA: Diagnosis not present

## 2019-09-03 DIAGNOSIS — Z20828 Contact with and (suspected) exposure to other viral communicable diseases: Secondary | ICD-10-CM | POA: Diagnosis not present

## 2019-09-10 DIAGNOSIS — Z1159 Encounter for screening for other viral diseases: Secondary | ICD-10-CM | POA: Diagnosis not present

## 2019-09-10 DIAGNOSIS — Z20828 Contact with and (suspected) exposure to other viral communicable diseases: Secondary | ICD-10-CM | POA: Diagnosis not present

## 2019-09-17 DIAGNOSIS — Z1159 Encounter for screening for other viral diseases: Secondary | ICD-10-CM | POA: Diagnosis not present

## 2019-09-17 DIAGNOSIS — Z20828 Contact with and (suspected) exposure to other viral communicable diseases: Secondary | ICD-10-CM | POA: Diagnosis not present

## 2019-09-24 DIAGNOSIS — Z1159 Encounter for screening for other viral diseases: Secondary | ICD-10-CM | POA: Diagnosis not present

## 2019-09-24 DIAGNOSIS — Z20828 Contact with and (suspected) exposure to other viral communicable diseases: Secondary | ICD-10-CM | POA: Diagnosis not present

## 2019-10-01 DIAGNOSIS — Z20828 Contact with and (suspected) exposure to other viral communicable diseases: Secondary | ICD-10-CM | POA: Diagnosis not present

## 2019-10-01 DIAGNOSIS — Z1159 Encounter for screening for other viral diseases: Secondary | ICD-10-CM | POA: Diagnosis not present

## 2019-10-08 DIAGNOSIS — Z1159 Encounter for screening for other viral diseases: Secondary | ICD-10-CM | POA: Diagnosis not present

## 2019-10-08 DIAGNOSIS — Z20828 Contact with and (suspected) exposure to other viral communicable diseases: Secondary | ICD-10-CM | POA: Diagnosis not present

## 2019-10-15 DIAGNOSIS — Z1159 Encounter for screening for other viral diseases: Secondary | ICD-10-CM | POA: Diagnosis not present

## 2019-10-15 DIAGNOSIS — Z20828 Contact with and (suspected) exposure to other viral communicable diseases: Secondary | ICD-10-CM | POA: Diagnosis not present

## 2019-10-22 DIAGNOSIS — Z1159 Encounter for screening for other viral diseases: Secondary | ICD-10-CM | POA: Diagnosis not present

## 2019-10-22 DIAGNOSIS — Z20828 Contact with and (suspected) exposure to other viral communicable diseases: Secondary | ICD-10-CM | POA: Diagnosis not present

## 2019-10-29 DIAGNOSIS — Z20828 Contact with and (suspected) exposure to other viral communicable diseases: Secondary | ICD-10-CM | POA: Diagnosis not present

## 2019-10-29 DIAGNOSIS — Z1159 Encounter for screening for other viral diseases: Secondary | ICD-10-CM | POA: Diagnosis not present

## 2019-11-05 DIAGNOSIS — Z1159 Encounter for screening for other viral diseases: Secondary | ICD-10-CM | POA: Diagnosis not present

## 2019-11-05 DIAGNOSIS — Z20828 Contact with and (suspected) exposure to other viral communicable diseases: Secondary | ICD-10-CM | POA: Diagnosis not present

## 2019-11-12 DIAGNOSIS — Z1159 Encounter for screening for other viral diseases: Secondary | ICD-10-CM | POA: Diagnosis not present

## 2019-11-12 DIAGNOSIS — Z20828 Contact with and (suspected) exposure to other viral communicable diseases: Secondary | ICD-10-CM | POA: Diagnosis not present

## 2019-11-14 ENCOUNTER — Other Ambulatory Visit: Payer: Self-pay

## 2019-11-14 ENCOUNTER — Ambulatory Visit (INDEPENDENT_AMBULATORY_CARE_PROVIDER_SITE_OTHER): Payer: Medicare Other | Admitting: Podiatry

## 2019-11-14 ENCOUNTER — Encounter: Payer: Self-pay | Admitting: Podiatry

## 2019-11-14 DIAGNOSIS — M2041 Other hammer toe(s) (acquired), right foot: Secondary | ICD-10-CM

## 2019-11-14 DIAGNOSIS — B351 Tinea unguium: Secondary | ICD-10-CM

## 2019-11-14 DIAGNOSIS — D689 Coagulation defect, unspecified: Secondary | ICD-10-CM | POA: Diagnosis not present

## 2019-11-14 DIAGNOSIS — M2042 Other hammer toe(s) (acquired), left foot: Secondary | ICD-10-CM

## 2019-11-14 DIAGNOSIS — L84 Corns and callosities: Secondary | ICD-10-CM

## 2019-11-14 DIAGNOSIS — M79674 Pain in right toe(s): Secondary | ICD-10-CM | POA: Diagnosis not present

## 2019-11-14 DIAGNOSIS — M79675 Pain in left toe(s): Secondary | ICD-10-CM

## 2019-11-18 NOTE — Progress Notes (Addendum)
Subjective: Cassidy Ellison is a pleasant 86 y.o. female patient seen today on long term blood thinner, Plavix. She presents with painful corn(s) b/l 2nd digits and callus(es) b/l feet and painful mycotic nails.  Pain interferes with ambulation. Aggravating factors include wearing enclosed shoe gear.   She voices no new pedal concerns on today's visit.  Past Medical History:  Diagnosis Date  . Carpal tunnel syndrome    left  . DVT (deep venous thrombosis) ([place])   . GERD (gastroesophageal reflux disease)   . Hyperlipemia   . Hypertension   . RLS (restless legs syndrome)     Patient Active Problem List   Diagnosis Date Noted  . Wheezing 02/09/2018  . Pulmonary nodule 02/09/2018  . Palpitations 08/12/2015  . HTN (hypertension) 08/12/2015  . Acute confusional state 08/12/2015  . Cerebrovascular accident (CVA) ([place])   . Hyperlipidemia   . TIA (transient ischemic attack) 06/13/2015  . Influenza with pneumonia 05/17/2015  . Essential hypertension 05/15/2015  . GERD (gastroesophageal reflux disease) 05/15/2015  . Cough 05/15/2015  . Complicated migraine 16/02/9603  . RLS (restless legs syndrome)   . Hyponatremia   . Stridor   . Dehydration 05/11/2015  . UTI (lower urinary tract infection) 05/11/2015  . Acute bronchitis with bronchospasm 05/11/2015    Current Outpatient Medications on File Prior to Visit  Medication Sig Dispense Refill  . alendronate (FOSAMAX) 70 MG tablet     . amLODipine (NORVASC) 5 MG tablet Take 5 mg by mouth daily.     Marland Kitchen atorvastatin (LIPITOR) 20 MG tablet Take 20 mg by mouth daily.     . betamethasone dipropionate (DIPROLENE) 0.05 % ointment Apply 1 application topically 2 (two) times daily.   2  . Calcium Carbonate (CALCIUM 600 PO) Take 600 mg by mouth daily.    . Cholecalciferol (VITAMIN D-3) 1000 units CAPS Take 1,000 Units by mouth daily.    . clopidogrel (PLAVIX) 75 MG tablet Take 1 tablet (75 mg total) by mouth daily. 90 tablet 3  . fexofenadine  (ALLEGRA) 180 MG tablet Take 180 mg by mouth daily.    . fluticasone (FLONASE) 50 MCG/ACT nasal spray Place 2 sprays into both nostrils daily. 16 g 5  . losartan (COZAAR) 100 MG tablet Take 100 mg by mouth daily.    . metoprolol succinate (TOPROL-XL) 50 MG 24 hr tablet Take 1 tablet (50 mg total) by mouth daily. 30 tablet 3  . Multiple Vitamins-Minerals (MULTIVITAMIN WITH MINERALS) tablet Take 1 tablet by mouth daily.     . Omega-3 Fatty Acids (FISH OIL) 1000 MG CAPS Take by mouth.    . pantoprazole (PROTONIX) 40 MG tablet     . polyethylene glycol powder (GLYCOLAX/MIRALAX) 17 GM/SCOOP powder Take by mouth.    Marland Kitchen rOPINIRole (REQUIP) 1 MG tablet Take 1 mg by mouth at bedtime.    . triamcinolone cream (KENALOG) 0.1 % APPLY TO GROIN AREA ONCE DAILY AS NEEDED FOR ITCH    . vitamin C (ASCORBIC ACID) 500 MG tablet Take 500 mg by mouth daily.     No current facility-administered medications on file prior to visit.    Allergies  Allergen Reactions  . Corticosteroids Other (See Comments)    Caused patient to have low sodium levels  . Adhesive [Tape] Itching  . Chlorthalidone Other (See Comments)    creatinine  increases  . Hydrochlorothiazide Other (See Comments)    CREATININE INCREASES  . Oxsoralen [Methoxsalen]     Objective: Physical Exam  General:  Cassidy Ellison is a pleasant 86 y.o. Caucasian female, WD, WN in NAD. AAO x 3.   Vascular:  Capillary refill time to digits immediate b/l. Palpable pedal pulses b/l LE. Pedal hair absent. Lower extremity skin temperature gradient within normal limits. No pain with calf compression b/l. No edema noted b/l lower extremities.  Dermatological:  Pedal skin with normal turgor, texture and tone bilaterally. No open wounds bilaterally. No interdigital macerations bilaterally. Toenails 1-5 b/l elongated, discolored, dystrophic, thickened, crumbly with subungual debris and tenderness to dorsal palpation. Hyperkeratotic lesion(s) L hallux, distal tip L 2nd  toe, dorsal 2nd PIPJ left 2nd toe, L 3rd toe, R hallux, R 2nd toe medially, R 3rd toe and submet head 1 left foot.  No erythema, no edema, no drainage, no flocculence.  Musculoskeletal:  Normal muscle strength 5/5 to all lower extremity muscle groups bilaterally. No pain crepitus or joint limitation noted with ROM b/l. Hammertoes noted to the 2-5 bilaterally.  Neurological:  Protective sensation intact 5/5 intact bilaterally with 10g monofilament b/l. Vibratory sensation intact b/l. Proprioception intact bilaterally. Clonus negative b/l.  Assessment and Plan:  1. Pain due to onychomycosis of toenails of both feet   2. Corns and callosities   3. Acquired hammertoes of both feet   4. Blood clotting disorder ([place])     -Examined patient. -No new findings. No new orders. -Toenails 1-5 b/l were debrided in length and girth with sterile nail nippers and dremel without iatrogenic bleeding.  -Corn(s) distal tip L 2nd toe, dorsal 2nd PIPJ left 2nd toe, L 3rd toe, R 2nd toe medially, R 3rd toe and callus(es) L hallux, R hallux and submet head 1 left foot were pared utilizing sterile scalpel blade without incident. Total number debrided =8. -Toe cap dispensed for left 2nd toe and foam toe separator for right 2nd toe for interdigital corn. Apply every morning and remove every evening. -Patient to report any pedal injuries to medical professional immediately. -Patient to continue soft, supportive shoe gear daily. -Patient/POA to call should there be question/concern in the interim.  Return in about 3 months (around 02/14/2020) for nail and callus trim/ Plavix.  Marzetta Board, DPM

## 2019-11-19 DIAGNOSIS — Z1159 Encounter for screening for other viral diseases: Secondary | ICD-10-CM | POA: Diagnosis not present

## 2019-11-19 DIAGNOSIS — Z20828 Contact with and (suspected) exposure to other viral communicable diseases: Secondary | ICD-10-CM | POA: Diagnosis not present

## 2019-11-26 DIAGNOSIS — Z20828 Contact with and (suspected) exposure to other viral communicable diseases: Secondary | ICD-10-CM | POA: Diagnosis not present

## 2019-11-26 DIAGNOSIS — Z1159 Encounter for screening for other viral diseases: Secondary | ICD-10-CM | POA: Diagnosis not present

## 2019-12-03 DIAGNOSIS — Z1159 Encounter for screening for other viral diseases: Secondary | ICD-10-CM | POA: Diagnosis not present

## 2019-12-03 DIAGNOSIS — Z20828 Contact with and (suspected) exposure to other viral communicable diseases: Secondary | ICD-10-CM | POA: Diagnosis not present

## 2019-12-10 DIAGNOSIS — Z20828 Contact with and (suspected) exposure to other viral communicable diseases: Secondary | ICD-10-CM | POA: Diagnosis not present

## 2019-12-10 DIAGNOSIS — Z1159 Encounter for screening for other viral diseases: Secondary | ICD-10-CM | POA: Diagnosis not present

## 2019-12-12 DIAGNOSIS — D485 Neoplasm of uncertain behavior of skin: Secondary | ICD-10-CM | POA: Diagnosis not present

## 2019-12-12 DIAGNOSIS — L821 Other seborrheic keratosis: Secondary | ICD-10-CM | POA: Diagnosis not present

## 2019-12-12 DIAGNOSIS — Z85828 Personal history of other malignant neoplasm of skin: Secondary | ICD-10-CM | POA: Diagnosis not present

## 2019-12-12 DIAGNOSIS — L570 Actinic keratosis: Secondary | ICD-10-CM | POA: Diagnosis not present

## 2019-12-12 DIAGNOSIS — D0439 Carcinoma in situ of skin of other parts of face: Secondary | ICD-10-CM | POA: Diagnosis not present

## 2019-12-17 DIAGNOSIS — Z1159 Encounter for screening for other viral diseases: Secondary | ICD-10-CM | POA: Diagnosis not present

## 2019-12-17 DIAGNOSIS — Z20828 Contact with and (suspected) exposure to other viral communicable diseases: Secondary | ICD-10-CM | POA: Diagnosis not present

## 2019-12-24 DIAGNOSIS — Z20828 Contact with and (suspected) exposure to other viral communicable diseases: Secondary | ICD-10-CM | POA: Diagnosis not present

## 2019-12-24 DIAGNOSIS — Z1159 Encounter for screening for other viral diseases: Secondary | ICD-10-CM | POA: Diagnosis not present

## 2019-12-31 DIAGNOSIS — Z20828 Contact with and (suspected) exposure to other viral communicable diseases: Secondary | ICD-10-CM | POA: Diagnosis not present

## 2019-12-31 DIAGNOSIS — Z1159 Encounter for screening for other viral diseases: Secondary | ICD-10-CM | POA: Diagnosis not present

## 2020-01-07 DIAGNOSIS — Z1159 Encounter for screening for other viral diseases: Secondary | ICD-10-CM | POA: Diagnosis not present

## 2020-01-07 DIAGNOSIS — Z20828 Contact with and (suspected) exposure to other viral communicable diseases: Secondary | ICD-10-CM | POA: Diagnosis not present

## 2020-01-14 DIAGNOSIS — Z20828 Contact with and (suspected) exposure to other viral communicable diseases: Secondary | ICD-10-CM | POA: Diagnosis not present

## 2020-01-14 DIAGNOSIS — Z1159 Encounter for screening for other viral diseases: Secondary | ICD-10-CM | POA: Diagnosis not present

## 2020-01-18 DIAGNOSIS — I10 Essential (primary) hypertension: Secondary | ICD-10-CM | POA: Diagnosis not present

## 2020-01-18 DIAGNOSIS — R413 Other amnesia: Secondary | ICD-10-CM | POA: Diagnosis not present

## 2020-01-18 DIAGNOSIS — E559 Vitamin D deficiency, unspecified: Secondary | ICD-10-CM | POA: Diagnosis not present

## 2020-01-18 DIAGNOSIS — Z23 Encounter for immunization: Secondary | ICD-10-CM | POA: Diagnosis not present

## 2020-01-18 DIAGNOSIS — E785 Hyperlipidemia, unspecified: Secondary | ICD-10-CM | POA: Diagnosis not present

## 2020-01-18 DIAGNOSIS — R29818 Other symptoms and signs involving the nervous system: Secondary | ICD-10-CM | POA: Diagnosis not present

## 2020-01-18 DIAGNOSIS — R296 Repeated falls: Secondary | ICD-10-CM | POA: Diagnosis not present

## 2020-01-21 DIAGNOSIS — Z1159 Encounter for screening for other viral diseases: Secondary | ICD-10-CM | POA: Diagnosis not present

## 2020-01-21 DIAGNOSIS — Z20828 Contact with and (suspected) exposure to other viral communicable diseases: Secondary | ICD-10-CM | POA: Diagnosis not present

## 2020-01-22 DIAGNOSIS — R2681 Unsteadiness on feet: Secondary | ICD-10-CM | POA: Diagnosis not present

## 2020-01-22 DIAGNOSIS — R296 Repeated falls: Secondary | ICD-10-CM | POA: Diagnosis not present

## 2020-01-22 DIAGNOSIS — M6281 Muscle weakness (generalized): Secondary | ICD-10-CM | POA: Diagnosis not present

## 2020-01-22 DIAGNOSIS — R29818 Other symptoms and signs involving the nervous system: Secondary | ICD-10-CM | POA: Diagnosis not present

## 2020-01-24 DIAGNOSIS — R296 Repeated falls: Secondary | ICD-10-CM | POA: Diagnosis not present

## 2020-01-24 DIAGNOSIS — R29818 Other symptoms and signs involving the nervous system: Secondary | ICD-10-CM | POA: Diagnosis not present

## 2020-01-24 DIAGNOSIS — R2681 Unsteadiness on feet: Secondary | ICD-10-CM | POA: Diagnosis not present

## 2020-01-24 DIAGNOSIS — M6281 Muscle weakness (generalized): Secondary | ICD-10-CM | POA: Diagnosis not present

## 2020-01-28 DIAGNOSIS — Z20828 Contact with and (suspected) exposure to other viral communicable diseases: Secondary | ICD-10-CM | POA: Diagnosis not present

## 2020-01-28 DIAGNOSIS — Z1159 Encounter for screening for other viral diseases: Secondary | ICD-10-CM | POA: Diagnosis not present

## 2020-01-29 DIAGNOSIS — R29818 Other symptoms and signs involving the nervous system: Secondary | ICD-10-CM | POA: Diagnosis not present

## 2020-01-29 DIAGNOSIS — M6281 Muscle weakness (generalized): Secondary | ICD-10-CM | POA: Diagnosis not present

## 2020-01-29 DIAGNOSIS — R296 Repeated falls: Secondary | ICD-10-CM | POA: Diagnosis not present

## 2020-01-29 DIAGNOSIS — R2681 Unsteadiness on feet: Secondary | ICD-10-CM | POA: Diagnosis not present

## 2020-01-31 DIAGNOSIS — R296 Repeated falls: Secondary | ICD-10-CM | POA: Diagnosis not present

## 2020-01-31 DIAGNOSIS — M6281 Muscle weakness (generalized): Secondary | ICD-10-CM | POA: Diagnosis not present

## 2020-01-31 DIAGNOSIS — R2681 Unsteadiness on feet: Secondary | ICD-10-CM | POA: Diagnosis not present

## 2020-01-31 DIAGNOSIS — R29818 Other symptoms and signs involving the nervous system: Secondary | ICD-10-CM | POA: Diagnosis not present

## 2020-02-04 DIAGNOSIS — Z20828 Contact with and (suspected) exposure to other viral communicable diseases: Secondary | ICD-10-CM | POA: Diagnosis not present

## 2020-02-04 DIAGNOSIS — Z1159 Encounter for screening for other viral diseases: Secondary | ICD-10-CM | POA: Diagnosis not present

## 2020-02-05 DIAGNOSIS — R29818 Other symptoms and signs involving the nervous system: Secondary | ICD-10-CM | POA: Diagnosis not present

## 2020-02-05 DIAGNOSIS — R296 Repeated falls: Secondary | ICD-10-CM | POA: Diagnosis not present

## 2020-02-05 DIAGNOSIS — M6281 Muscle weakness (generalized): Secondary | ICD-10-CM | POA: Diagnosis not present

## 2020-02-05 DIAGNOSIS — R2681 Unsteadiness on feet: Secondary | ICD-10-CM | POA: Diagnosis not present

## 2020-02-07 DIAGNOSIS — M6281 Muscle weakness (generalized): Secondary | ICD-10-CM | POA: Diagnosis not present

## 2020-02-07 DIAGNOSIS — R2681 Unsteadiness on feet: Secondary | ICD-10-CM | POA: Diagnosis not present

## 2020-02-07 DIAGNOSIS — R296 Repeated falls: Secondary | ICD-10-CM | POA: Diagnosis not present

## 2020-02-07 DIAGNOSIS — R29818 Other symptoms and signs involving the nervous system: Secondary | ICD-10-CM | POA: Diagnosis not present

## 2020-02-11 DIAGNOSIS — Z1159 Encounter for screening for other viral diseases: Secondary | ICD-10-CM | POA: Diagnosis not present

## 2020-02-11 DIAGNOSIS — Z20828 Contact with and (suspected) exposure to other viral communicable diseases: Secondary | ICD-10-CM | POA: Diagnosis not present

## 2020-02-12 DIAGNOSIS — R2681 Unsteadiness on feet: Secondary | ICD-10-CM | POA: Diagnosis not present

## 2020-02-12 DIAGNOSIS — R296 Repeated falls: Secondary | ICD-10-CM | POA: Diagnosis not present

## 2020-02-12 DIAGNOSIS — R29818 Other symptoms and signs involving the nervous system: Secondary | ICD-10-CM | POA: Diagnosis not present

## 2020-02-12 DIAGNOSIS — M6281 Muscle weakness (generalized): Secondary | ICD-10-CM | POA: Diagnosis not present

## 2020-02-14 DIAGNOSIS — R2681 Unsteadiness on feet: Secondary | ICD-10-CM | POA: Diagnosis not present

## 2020-02-14 DIAGNOSIS — R29818 Other symptoms and signs involving the nervous system: Secondary | ICD-10-CM | POA: Diagnosis not present

## 2020-02-14 DIAGNOSIS — M6281 Muscle weakness (generalized): Secondary | ICD-10-CM | POA: Diagnosis not present

## 2020-02-14 DIAGNOSIS — R296 Repeated falls: Secondary | ICD-10-CM | POA: Diagnosis not present

## 2020-02-18 DIAGNOSIS — Z1159 Encounter for screening for other viral diseases: Secondary | ICD-10-CM | POA: Diagnosis not present

## 2020-02-18 DIAGNOSIS — Z20828 Contact with and (suspected) exposure to other viral communicable diseases: Secondary | ICD-10-CM | POA: Diagnosis not present

## 2020-02-19 DIAGNOSIS — R296 Repeated falls: Secondary | ICD-10-CM | POA: Diagnosis not present

## 2020-02-19 DIAGNOSIS — R29818 Other symptoms and signs involving the nervous system: Secondary | ICD-10-CM | POA: Diagnosis not present

## 2020-02-19 DIAGNOSIS — M6281 Muscle weakness (generalized): Secondary | ICD-10-CM | POA: Diagnosis not present

## 2020-02-19 DIAGNOSIS — R2681 Unsteadiness on feet: Secondary | ICD-10-CM | POA: Diagnosis not present

## 2020-02-21 DIAGNOSIS — R2681 Unsteadiness on feet: Secondary | ICD-10-CM | POA: Diagnosis not present

## 2020-02-21 DIAGNOSIS — R29818 Other symptoms and signs involving the nervous system: Secondary | ICD-10-CM | POA: Diagnosis not present

## 2020-02-21 DIAGNOSIS — R296 Repeated falls: Secondary | ICD-10-CM | POA: Diagnosis not present

## 2020-02-21 DIAGNOSIS — M6281 Muscle weakness (generalized): Secondary | ICD-10-CM | POA: Diagnosis not present

## 2020-02-25 ENCOUNTER — Encounter: Payer: Self-pay | Admitting: Podiatry

## 2020-02-25 ENCOUNTER — Ambulatory Visit (INDEPENDENT_AMBULATORY_CARE_PROVIDER_SITE_OTHER): Payer: Medicare Other | Admitting: Podiatry

## 2020-02-25 ENCOUNTER — Other Ambulatory Visit: Payer: Self-pay

## 2020-02-25 DIAGNOSIS — M2041 Other hammer toe(s) (acquired), right foot: Secondary | ICD-10-CM

## 2020-02-25 DIAGNOSIS — D689 Coagulation defect, unspecified: Secondary | ICD-10-CM

## 2020-02-25 DIAGNOSIS — B351 Tinea unguium: Secondary | ICD-10-CM | POA: Diagnosis not present

## 2020-02-25 DIAGNOSIS — M79674 Pain in right toe(s): Secondary | ICD-10-CM | POA: Diagnosis not present

## 2020-02-25 DIAGNOSIS — Z1159 Encounter for screening for other viral diseases: Secondary | ICD-10-CM | POA: Diagnosis not present

## 2020-02-25 DIAGNOSIS — Z20828 Contact with and (suspected) exposure to other viral communicable diseases: Secondary | ICD-10-CM | POA: Diagnosis not present

## 2020-02-25 DIAGNOSIS — M79675 Pain in left toe(s): Secondary | ICD-10-CM | POA: Diagnosis not present

## 2020-02-25 DIAGNOSIS — L84 Corns and callosities: Secondary | ICD-10-CM

## 2020-02-25 DIAGNOSIS — M2042 Other hammer toe(s) (acquired), left foot: Secondary | ICD-10-CM

## 2020-02-25 NOTE — Progress Notes (Signed)
Subjective: Cassidy Ellison is a pleasant 86 y.o. female patient seen today on long term blood thinner, Plavix. She presents with painful corn(s) b/l 2nd digits and callus(es) b/l feet and painful mycotic nails.  Pain interferes with ambulation. Aggravating factors include wearing enclosed shoe gear.   She voices no new pedal concerns on today's visit.  Past Medical History:  Diagnosis Date  . Carpal tunnel syndrome    left  . DVT (deep venous thrombosis) ([place])   . GERD (gastroesophageal reflux disease)   . Hyperlipemia   . Hypertension   . RLS (restless legs syndrome)     Patient Active Problem List   Diagnosis Date Noted  . Wheezing 02/09/2018  . Pulmonary nodule 02/09/2018  . Palpitations 08/12/2015  . HTN (hypertension) 08/12/2015  . Acute confusional state 08/12/2015  . Cerebrovascular accident (CVA) ([place])   . Hyperlipidemia   . TIA (transient ischemic attack) 06/13/2015  . Influenza with pneumonia 05/17/2015  . Essential hypertension 05/15/2015  . GERD (gastroesophageal reflux disease) 05/15/2015  . Cough 05/15/2015  . Complicated migraine 16/02/9603  . RLS (restless legs syndrome)   . Hyponatremia   . Stridor   . Dehydration 05/11/2015  . UTI (lower urinary tract infection) 05/11/2015  . Acute bronchitis with bronchospasm 05/11/2015    Current Outpatient Medications on File Prior to Visit  Medication Sig Dispense Refill  . alendronate (FOSAMAX) 70 MG tablet     . amLODipine (NORVASC) 5 MG tablet Take 5 mg by mouth daily.     Marland Kitchen atorvastatin (LIPITOR) 20 MG tablet Take 20 mg by mouth daily.     . betamethasone dipropionate (DIPROLENE) 0.05 % ointment Apply 1 application topically 2 (two) times daily.   2  . Calcium Carbonate (CALCIUM 600 PO) Take 600 mg by mouth daily.    . Cholecalciferol (VITAMIN D-3) 1000 units CAPS Take 1,000 Units by mouth daily.    . clopidogrel (PLAVIX) 75 MG tablet Take 1 tablet (75 mg total) by mouth daily. 90 tablet 3  . fexofenadine  (ALLEGRA) 180 MG tablet Take 180 mg by mouth daily.    . fluticasone (FLONASE) 50 MCG/ACT nasal spray Place 2 sprays into both nostrils daily. 16 g 5  . losartan (COZAAR) 100 MG tablet Take 100 mg by mouth daily.    . metoprolol succinate (TOPROL-XL) 50 MG 24 hr tablet Take 1 tablet (50 mg total) by mouth daily. 30 tablet 3  . Multiple Vitamins-Minerals (MULTIVITAMIN WITH MINERALS) tablet Take 1 tablet by mouth daily.     . Omega-3 Fatty Acids (FISH OIL) 1000 MG CAPS Take by mouth.    . pantoprazole (PROTONIX) 40 MG tablet     . polyethylene glycol powder (GLYCOLAX/MIRALAX) 17 GM/SCOOP powder Take by mouth.    Marland Kitchen rOPINIRole (REQUIP) 1 MG tablet Take 1 mg by mouth at bedtime.    . triamcinolone cream (KENALOG) 0.1 % APPLY TO GROIN AREA ONCE DAILY AS NEEDED FOR ITCH    . vitamin C (ASCORBIC ACID) 500 MG tablet Take 500 mg by mouth daily.     No current facility-administered medications on file prior to visit.    Allergies  Allergen Reactions  . Corticosteroids Other (See Comments)    Caused patient to have low sodium levels  . Adhesive [Tape] Itching  . Chlorthalidone Other (See Comments)    creatinine  increases  . Hydrochlorothiazide Other (See Comments)    CREATININE INCREASES  . Oxsoralen [Methoxsalen]     Objective: Physical Exam  General:  Cassidy Ellison is a pleasant 86 y.o. Caucasian female, WD, WN in NAD. AAO x 3.   Vascular:  Capillary refill time to digits immediate b/l. Palpable pedal pulses b/l LE. Pedal hair absent. Lower extremity skin temperature gradient within normal limits. No pain with calf compression b/l. No edema noted b/l lower extremities.  Dermatological:  Pedal skin with normal turgor, texture and tone bilaterally. No open wounds bilaterally. No interdigital macerations bilaterally. Toenails 1-5 b/l elongated, discolored, dystrophic, thickened, crumbly with subungual debris and tenderness to dorsal palpation. Hyperkeratotic lesion(s) L hallux, distal tip L 2nd  toe, dorsal 2nd PIPJ left 2nd toe, L 3rd toe, R hallux, R 2nd toe medially, R 3rd toe and submet head 1 left foot.  No erythema, no edema, no drainage, no flocculence.  Musculoskeletal:  Normal muscle strength 5/5 to all lower extremity muscle groups bilaterally. No pain crepitus or joint limitation noted with ROM b/l. Hammertoes noted to the 2-5 bilaterally.  Neurological:  Protective sensation intact 5/5 intact bilaterally with 10g monofilament b/l. Vibratory sensation intact b/l. Proprioception intact bilaterally. Clonus negative b/l.  Assessment and Plan:  1. Pain due to onychomycosis of toenails of both feet   2. Corns and callosities   3. Acquired hammertoes of both feet   4. Blood clotting disorder ([place])     -Examined patient. -No new findings. No new orders. -Toenails 1-5 b/l were debrided in length and girth with sterile nail nippers and dremel without iatrogenic bleeding.  -Corn(s) distal tip L 2nd toe, dorsal 2nd PIPJ left 2nd toe, L 3rd toe, R 2nd toe medially, R 3rd toe and callus(es) L hallux, R hallux and submet head 1 left foot were pared utilizing sterile scalpel blade without incident. Total number debrided =8. -Patient to report any pedal injuries to medical professional immediately. -Patient to continue soft, supportive shoe gear daily. -Patient/POA to call should there be question/concern in the interim.  Return in about 3 months (around 05/27/2020).  Marzetta Board, DPM

## 2020-02-26 DIAGNOSIS — M6281 Muscle weakness (generalized): Secondary | ICD-10-CM | POA: Diagnosis not present

## 2020-02-26 DIAGNOSIS — R2681 Unsteadiness on feet: Secondary | ICD-10-CM | POA: Diagnosis not present

## 2020-02-26 DIAGNOSIS — R296 Repeated falls: Secondary | ICD-10-CM | POA: Diagnosis not present

## 2020-02-26 DIAGNOSIS — R29818 Other symptoms and signs involving the nervous system: Secondary | ICD-10-CM | POA: Diagnosis not present

## 2020-02-28 DIAGNOSIS — R2681 Unsteadiness on feet: Secondary | ICD-10-CM | POA: Diagnosis not present

## 2020-02-28 DIAGNOSIS — M6281 Muscle weakness (generalized): Secondary | ICD-10-CM | POA: Diagnosis not present

## 2020-02-28 DIAGNOSIS — R296 Repeated falls: Secondary | ICD-10-CM | POA: Diagnosis not present

## 2020-02-28 DIAGNOSIS — R29818 Other symptoms and signs involving the nervous system: Secondary | ICD-10-CM | POA: Diagnosis not present

## 2020-03-03 DIAGNOSIS — Z1159 Encounter for screening for other viral diseases: Secondary | ICD-10-CM | POA: Diagnosis not present

## 2020-03-03 DIAGNOSIS — Z20828 Contact with and (suspected) exposure to other viral communicable diseases: Secondary | ICD-10-CM | POA: Diagnosis not present

## 2020-03-06 DIAGNOSIS — R296 Repeated falls: Secondary | ICD-10-CM | POA: Diagnosis not present

## 2020-03-06 DIAGNOSIS — M6281 Muscle weakness (generalized): Secondary | ICD-10-CM | POA: Diagnosis not present

## 2020-03-06 DIAGNOSIS — R2681 Unsteadiness on feet: Secondary | ICD-10-CM | POA: Diagnosis not present

## 2020-03-06 DIAGNOSIS — R29818 Other symptoms and signs involving the nervous system: Secondary | ICD-10-CM | POA: Diagnosis not present

## 2020-03-10 DIAGNOSIS — Z20828 Contact with and (suspected) exposure to other viral communicable diseases: Secondary | ICD-10-CM | POA: Diagnosis not present

## 2020-03-10 DIAGNOSIS — Z1159 Encounter for screening for other viral diseases: Secondary | ICD-10-CM | POA: Diagnosis not present

## 2020-03-17 DIAGNOSIS — Z1159 Encounter for screening for other viral diseases: Secondary | ICD-10-CM | POA: Diagnosis not present

## 2020-03-17 DIAGNOSIS — Z20828 Contact with and (suspected) exposure to other viral communicable diseases: Secondary | ICD-10-CM | POA: Diagnosis not present

## 2020-03-21 DIAGNOSIS — Z23 Encounter for immunization: Secondary | ICD-10-CM | POA: Diagnosis not present

## 2020-03-24 DIAGNOSIS — Z1159 Encounter for screening for other viral diseases: Secondary | ICD-10-CM | POA: Diagnosis not present

## 2020-03-24 DIAGNOSIS — Z20828 Contact with and (suspected) exposure to other viral communicable diseases: Secondary | ICD-10-CM | POA: Diagnosis not present

## 2020-03-31 DIAGNOSIS — Z20828 Contact with and (suspected) exposure to other viral communicable diseases: Secondary | ICD-10-CM | POA: Diagnosis not present

## 2020-03-31 DIAGNOSIS — Z1159 Encounter for screening for other viral diseases: Secondary | ICD-10-CM | POA: Diagnosis not present

## 2020-04-07 DIAGNOSIS — Z20828 Contact with and (suspected) exposure to other viral communicable diseases: Secondary | ICD-10-CM | POA: Diagnosis not present

## 2020-04-07 DIAGNOSIS — Z1159 Encounter for screening for other viral diseases: Secondary | ICD-10-CM | POA: Diagnosis not present

## 2020-04-14 DIAGNOSIS — Z1159 Encounter for screening for other viral diseases: Secondary | ICD-10-CM | POA: Diagnosis not present

## 2020-04-14 DIAGNOSIS — Z20828 Contact with and (suspected) exposure to other viral communicable diseases: Secondary | ICD-10-CM | POA: Diagnosis not present

## 2020-04-21 DIAGNOSIS — Z20828 Contact with and (suspected) exposure to other viral communicable diseases: Secondary | ICD-10-CM | POA: Diagnosis not present

## 2020-04-21 DIAGNOSIS — Z1159 Encounter for screening for other viral diseases: Secondary | ICD-10-CM | POA: Diagnosis not present

## 2020-04-28 ENCOUNTER — Ambulatory Visit (INDEPENDENT_AMBULATORY_CARE_PROVIDER_SITE_OTHER): Payer: Medicare Other | Admitting: Podiatry

## 2020-04-28 ENCOUNTER — Encounter: Payer: Self-pay | Admitting: Podiatry

## 2020-04-28 ENCOUNTER — Other Ambulatory Visit: Payer: Self-pay

## 2020-04-28 DIAGNOSIS — L84 Corns and callosities: Secondary | ICD-10-CM

## 2020-04-28 DIAGNOSIS — M79674 Pain in right toe(s): Secondary | ICD-10-CM | POA: Diagnosis not present

## 2020-04-28 DIAGNOSIS — M2042 Other hammer toe(s) (acquired), left foot: Secondary | ICD-10-CM

## 2020-04-28 DIAGNOSIS — M79675 Pain in left toe(s): Secondary | ICD-10-CM

## 2020-04-28 DIAGNOSIS — D689 Coagulation defect, unspecified: Secondary | ICD-10-CM | POA: Diagnosis not present

## 2020-04-28 DIAGNOSIS — B351 Tinea unguium: Secondary | ICD-10-CM | POA: Diagnosis not present

## 2020-04-28 DIAGNOSIS — M2041 Other hammer toe(s) (acquired), right foot: Secondary | ICD-10-CM

## 2020-05-04 NOTE — Progress Notes (Signed)
Subjective: Cassidy Ellison is a pleasant 86 y.o. female patient seen today on long term blood thinner, Plavix. She presents with painful corn(s) b/l 2nd digits and callus(es) b/l feet and painful mycotic nails.  Pain interferes with ambulation. Aggravating factors include wearing enclosed shoe gear.   She voices no new pedal concerns on today's visit.  Past Medical History:  Diagnosis Date   Carpal tunnel syndrome    left   DVT (deep venous thrombosis) (HCC)    GERD (gastroesophageal reflux disease)    Hyperlipemia    Hypertension    RLS (restless legs syndrome)     Patient Active Problem List   Diagnosis Date Noted   Wheezing 02/09/2018   Pulmonary nodule 02/09/2018   Palpitations 08/12/2015   HTN (hypertension) 08/12/2015   Acute confusional state 08/12/2015   Cerebrovascular accident (CVA) ([place])    Hyperlipidemia    TIA (transient ischemic attack) 06/13/2015   Influenza with pneumonia 05/17/2015   Essential hypertension 05/15/2015   GERD (gastroesophageal reflux disease) 05/15/2015   Cough 26/37/8588   Complicated migraine 50/27/7412   RLS (restless legs syndrome)    Hyponatremia    Stridor    Dehydration 05/11/2015   UTI (lower urinary tract infection) 05/11/2015   Acute bronchitis with bronchospasm 05/11/2015    Current Outpatient Medications on File Prior to Visit  Medication Sig Dispense Refill   alendronate (FOSAMAX) 70 MG tablet      amLODipine (NORVASC) 5 MG tablet Take 5 mg by mouth daily.      atorvastatin (LIPITOR) 20 MG tablet Take 20 mg by mouth daily.      betamethasone dipropionate (DIPROLENE) 0.05 % ointment Apply 1 application topically 2 (two) times daily.   2   Calcium Carbonate (CALCIUM 600 PO) Take 600 mg by mouth daily.     Cholecalciferol (VITAMIN D-3) 1000 units CAPS Take 1,000 Units by mouth daily.     clopidogrel (PLAVIX) 75 MG tablet Take 1 tablet (75 mg total) by mouth daily. 90 tablet 3   fexofenadine  (ALLEGRA) 180 MG tablet Take 180 mg by mouth daily.     fluticasone (FLONASE) 50 MCG/ACT nasal spray Place 2 sprays into both nostrils daily. 16 g 5   losartan (COZAAR) 100 MG tablet Take 100 mg by mouth daily.     metoprolol succinate (TOPROL-XL) 50 MG 24 hr tablet Take 1 tablet (50 mg total) by mouth daily. 30 tablet 3   Multiple Vitamins-Minerals (MULTIVITAMIN WITH MINERALS) tablet Take 1 tablet by mouth daily.      Omega-3 Fatty Acids (FISH OIL) 1000 MG CAPS Take by mouth.     pantoprazole (PROTONIX) 40 MG tablet      polyethylene glycol powder (GLYCOLAX/MIRALAX) 17 GM/SCOOP powder Take by mouth.     rOPINIRole (REQUIP) 1 MG tablet Take 1 mg by mouth at bedtime.     triamcinolone cream (KENALOG) 0.1 % APPLY TO GROIN AREA ONCE DAILY AS NEEDED FOR ITCH     vitamin C (ASCORBIC ACID) 500 MG tablet Take 500 mg by mouth daily.     No current facility-administered medications on file prior to visit.    Allergies  Allergen Reactions   Corticosteroids Other (See Comments)    Caused patient to have low sodium levels   Adhesive [Tape] Itching   Chlorthalidone Other (See Comments)    creatinine  increases   Hydrochlorothiazide Other (See Comments)    CREATININE INCREASES   Oxsoralen [Methoxsalen]     Objective: Physical Exam  General:  Cassidy Ellison is a pleasant 86 y.o. Caucasian female, WD, WN in NAD. AAO x 3.   Vascular:  Capillary refill time to digits immediate b/l. Palpable pedal pulses b/l LE. Pedal hair absent. Lower extremity skin temperature gradient within normal limits. No pain with calf compression b/l. No edema noted b/l lower extremities.  Dermatological:  Pedal skin with normal turgor, texture and tone bilaterally. No open wounds bilaterally. No interdigital macerations bilaterally. Toenails 1-5 b/l elongated, discolored, dystrophic, thickened, crumbly with subungual debris and tenderness to dorsal palpation. Hyperkeratotic lesion(s) L hallux, distal tip L 2nd  toe, dorsal 2nd PIPJ left 2nd toe, L 3rd toe, R hallux, R 2nd toe medially, R 3rd toe and submet head 1 left foot.  No erythema, no edema, no drainage, no flocculence.   Musculoskeletal:  Normal muscle strength 5/5 to all lower extremity muscle groups bilaterally. No pain crepitus or joint limitation noted with ROM b/l. Hammertoes noted to the 2-5 bilaterally.  Neurological:  Protective sensation intact 5/5 intact bilaterally with 10g monofilament b/l. Vibratory sensation intact b/l. Proprioception intact bilaterally. Clonus negative b/l.  Assessment and Plan:  1. Pain due to onychomycosis of toenails of both feet   2. Corns and callosities   3. Acquired hammertoes of both feet   4. Blood clotting disorder ([place])     -Examined patient. -No new findings. No new orders. -Toenails 1-5 b/l were debrided in length and girth with sterile nail nippers and dremel without iatrogenic bleeding.  -Corn(s) distal tip L 2nd toe, dorsal 2nd PIPJ left 2nd toe, L 3rd toe, R 2nd toe medially, R 3rd toe and callus(es) L hallux, R hallux and submet head 1 left foot were pared utilizing sterile scalpel blade without incident. Total number debrided =8. -Patient to report any pedal injuries to medical professional immediately. -Patient to continue soft, supportive shoe gear daily. -Patient/POA to call should there be question/concern in the interim.  Return in about 9 weeks (around 06/30/2020) for nail and callus trim.  Marzetta Board, DPM

## 2020-05-05 DIAGNOSIS — Z20828 Contact with and (suspected) exposure to other viral communicable diseases: Secondary | ICD-10-CM | POA: Diagnosis not present

## 2020-05-05 DIAGNOSIS — Z1159 Encounter for screening for other viral diseases: Secondary | ICD-10-CM | POA: Diagnosis not present

## 2020-05-12 DIAGNOSIS — Z20828 Contact with and (suspected) exposure to other viral communicable diseases: Secondary | ICD-10-CM | POA: Diagnosis not present

## 2020-05-12 DIAGNOSIS — Z1159 Encounter for screening for other viral diseases: Secondary | ICD-10-CM | POA: Diagnosis not present

## 2020-05-19 DIAGNOSIS — Z1159 Encounter for screening for other viral diseases: Secondary | ICD-10-CM | POA: Diagnosis not present

## 2020-05-19 DIAGNOSIS — Z20828 Contact with and (suspected) exposure to other viral communicable diseases: Secondary | ICD-10-CM | POA: Diagnosis not present

## 2020-05-26 DIAGNOSIS — Z1159 Encounter for screening for other viral diseases: Secondary | ICD-10-CM | POA: Diagnosis not present

## 2020-05-26 DIAGNOSIS — Z20828 Contact with and (suspected) exposure to other viral communicable diseases: Secondary | ICD-10-CM | POA: Diagnosis not present

## 2020-05-28 DIAGNOSIS — H9193 Unspecified hearing loss, bilateral: Secondary | ICD-10-CM | POA: Diagnosis not present

## 2020-05-28 DIAGNOSIS — H6123 Impacted cerumen, bilateral: Secondary | ICD-10-CM | POA: Diagnosis not present

## 2020-06-16 DIAGNOSIS — Z20828 Contact with and (suspected) exposure to other viral communicable diseases: Secondary | ICD-10-CM | POA: Diagnosis not present

## 2020-06-16 DIAGNOSIS — Z1159 Encounter for screening for other viral diseases: Secondary | ICD-10-CM | POA: Diagnosis not present

## 2020-06-16 DIAGNOSIS — Z85828 Personal history of other malignant neoplasm of skin: Secondary | ICD-10-CM | POA: Diagnosis not present

## 2020-06-16 DIAGNOSIS — L821 Other seborrheic keratosis: Secondary | ICD-10-CM | POA: Diagnosis not present

## 2020-06-17 DIAGNOSIS — H9211 Otorrhea, right ear: Secondary | ICD-10-CM | POA: Diagnosis not present

## 2020-06-17 DIAGNOSIS — H9193 Unspecified hearing loss, bilateral: Secondary | ICD-10-CM | POA: Insufficient documentation

## 2020-06-17 DIAGNOSIS — H6122 Impacted cerumen, left ear: Secondary | ICD-10-CM | POA: Insufficient documentation

## 2020-06-17 DIAGNOSIS — H903 Sensorineural hearing loss, bilateral: Secondary | ICD-10-CM | POA: Insufficient documentation

## 2020-06-23 DIAGNOSIS — Z1159 Encounter for screening for other viral diseases: Secondary | ICD-10-CM | POA: Diagnosis not present

## 2020-06-23 DIAGNOSIS — Z20828 Contact with and (suspected) exposure to other viral communicable diseases: Secondary | ICD-10-CM | POA: Diagnosis not present

## 2020-06-30 ENCOUNTER — Encounter: Payer: Self-pay | Admitting: Podiatry

## 2020-06-30 ENCOUNTER — Other Ambulatory Visit: Payer: Self-pay

## 2020-06-30 ENCOUNTER — Ambulatory Visit (INDEPENDENT_AMBULATORY_CARE_PROVIDER_SITE_OTHER): Payer: Medicare Other | Admitting: Podiatry

## 2020-06-30 DIAGNOSIS — Z20828 Contact with and (suspected) exposure to other viral communicable diseases: Secondary | ICD-10-CM | POA: Diagnosis not present

## 2020-06-30 DIAGNOSIS — E041 Nontoxic single thyroid nodule: Secondary | ICD-10-CM | POA: Insufficient documentation

## 2020-06-30 DIAGNOSIS — J309 Allergic rhinitis, unspecified: Secondary | ICD-10-CM | POA: Insufficient documentation

## 2020-06-30 DIAGNOSIS — L409 Psoriasis, unspecified: Secondary | ICD-10-CM | POA: Insufficient documentation

## 2020-06-30 DIAGNOSIS — M79675 Pain in left toe(s): Secondary | ICD-10-CM | POA: Diagnosis not present

## 2020-06-30 DIAGNOSIS — K9189 Other postprocedural complications and disorders of digestive system: Secondary | ICD-10-CM | POA: Insufficient documentation

## 2020-06-30 DIAGNOSIS — B351 Tinea unguium: Secondary | ICD-10-CM

## 2020-06-30 DIAGNOSIS — M2042 Other hammer toe(s) (acquired), left foot: Secondary | ICD-10-CM

## 2020-06-30 DIAGNOSIS — D689 Coagulation defect, unspecified: Secondary | ICD-10-CM | POA: Diagnosis not present

## 2020-06-30 DIAGNOSIS — E559 Vitamin D deficiency, unspecified: Secondary | ICD-10-CM | POA: Insufficient documentation

## 2020-06-30 DIAGNOSIS — I700 Atherosclerosis of aorta: Secondary | ICD-10-CM | POA: Insufficient documentation

## 2020-06-30 DIAGNOSIS — Z1159 Encounter for screening for other viral diseases: Secondary | ICD-10-CM | POA: Diagnosis not present

## 2020-06-30 DIAGNOSIS — M79674 Pain in right toe(s): Secondary | ICD-10-CM

## 2020-06-30 DIAGNOSIS — E785 Hyperlipidemia, unspecified: Secondary | ICD-10-CM | POA: Insufficient documentation

## 2020-06-30 DIAGNOSIS — Z8673 Personal history of transient ischemic attack (TIA), and cerebral infarction without residual deficits: Secondary | ICD-10-CM | POA: Insufficient documentation

## 2020-06-30 DIAGNOSIS — L84 Corns and callosities: Secondary | ICD-10-CM | POA: Diagnosis not present

## 2020-06-30 DIAGNOSIS — R197 Diarrhea, unspecified: Secondary | ICD-10-CM | POA: Insufficient documentation

## 2020-06-30 DIAGNOSIS — R32 Unspecified urinary incontinence: Secondary | ICD-10-CM | POA: Insufficient documentation

## 2020-06-30 DIAGNOSIS — R413 Other amnesia: Secondary | ICD-10-CM | POA: Insufficient documentation

## 2020-06-30 DIAGNOSIS — Z9049 Acquired absence of other specified parts of digestive tract: Secondary | ICD-10-CM | POA: Insufficient documentation

## 2020-06-30 DIAGNOSIS — K580 Irritable bowel syndrome with diarrhea: Secondary | ICD-10-CM | POA: Insufficient documentation

## 2020-06-30 DIAGNOSIS — M810 Age-related osteoporosis without current pathological fracture: Secondary | ICD-10-CM | POA: Insufficient documentation

## 2020-06-30 DIAGNOSIS — M2041 Other hammer toe(s) (acquired), right foot: Secondary | ICD-10-CM

## 2020-06-30 DIAGNOSIS — R296 Repeated falls: Secondary | ICD-10-CM | POA: Insufficient documentation

## 2020-07-05 NOTE — Progress Notes (Addendum)
  Subjective:  Patient ID: Cassidy Ellison, female    DOB: [birth year],  MRN: [id number]  Cassidy Ellison presents to clinic today for at risk foot care with h/o clotting disorder and corn(s) b/l , callus(es) b/l and painful mycotic nails.  Pain interferes with ambulation. Aggravating factors include wearing enclosed shoe gear. Painful toenails interfere with ambulation. Aggravating factors include wearing enclosed shoe gear. Pain is relieved with periodic professional debridement. Painful corns and calluses are aggravated when weightbearing with and without shoegear. Pain is relieved with periodic professional debridement.  Allergies  Allergen Reactions  . Corticosteroids Other (See Comments)    Caused patient to have low sodium levels  . Adhesive [Tape] Itching  . Chlorthalidone Other (See Comments)    creatinine  increases Other reaction(s): elevated creatinine  . Denture Adhesive     Other reaction(s): rash  . Hydrochlorothiazide Other (See Comments)    CREATININE INCREASES Other reaction(s): elevated creatinine  . Methoxsalen     Other reaction(s): cream--rash  . Prednisone     Other reaction(s): hyponatremia    Review of Systems: Negative except as noted in the HPI. Objective:   Constitutional Cassidy Ellison is a pleasant 87 y.o. Caucasian female, WD, WN in NAD. AAO x 3.   Vascular Capillary refill time to digits immediate b/l. Palpable pedal pulses b/l LE. Pedal hair absent. Lower extremity skin temperature gradient within normal limits. No pain with calf compression b/l. No cyanosis or clubbing noted.  Neurologic Normal speech. Oriented to person, place, and time. Protective sensation intact 5/5 intact bilaterally with 10g monofilament b/l. Vibratory sensation intact b/l.  Dermatologic Pedal skin with normal turgor, texture and tone bilaterally. No open wounds bilaterally. No interdigital macerations bilaterally. Toenails 1-5 b/l elongated, discolored, dystrophic, thickened, crumbly with  subungual debris and tenderness to dorsal palpation. Hyperkeratotic lesion(s) left hallux, distal tip left 2nd toe, dorsal 2nd PIPJ left 2nd toe, left 3rd toe, right hallux, right 2nd toe medially, right 3rd toe and submet head 1 left foot.  No erythema, no edema, no drainage, no fluctuance.  Orthopedic: Normal muscle strength 5/5 to all lower extremity muscle groups bilaterally. No pain crepitus or joint limitation noted with ROM b/l. Hammertoes noted to the 2-5 bilaterally.   Radiographs: None Assessment:   1. Pain due to onychomycosis of toenails of both feet   2. Corns and callosities   3. Acquired hammertoes of both feet   4. Blood clotting disorder ([place])    Plan:  Patient was evaluated and treated and all questions answered.  Onychomycosis with pain -Nails palliatively debridement as below -Educated on self-care  Procedure: Nail Debridement Rationale: Pain Type of Debridement: manual, sharp debridement. Instrumentation: Nail nipper, rotary burr. Number of Nails: 10 -Examined patient. -Patient to continue soft, supportive shoe gear daily. -Toenails 1-5 b/l were debrided in length and girth with sterile nail nippers and dremel without iatrogenic bleeding.  -Corn(s) L 2nd toe, L 3rd toe, R 2nd toe and R 3rd toe and callus(es) L hallux, R hallux and submet head 1 left foot were pared utilizing sterile scalpel blade without incident. Total number debrided =8. -Patient to report any pedal injuries to medical professional immediately. -Patient/POA to call should there be question/concern in the interim.  Return in about 9 weeks (around 09/01/2020).  Marzetta Board, DPM

## 2020-07-07 DIAGNOSIS — Z1159 Encounter for screening for other viral diseases: Secondary | ICD-10-CM | POA: Diagnosis not present

## 2020-07-07 DIAGNOSIS — Z20828 Contact with and (suspected) exposure to other viral communicable diseases: Secondary | ICD-10-CM | POA: Diagnosis not present

## 2020-07-11 DIAGNOSIS — H9211 Otorrhea, right ear: Secondary | ICD-10-CM | POA: Diagnosis not present

## 2020-07-11 DIAGNOSIS — H9193 Unspecified hearing loss, bilateral: Secondary | ICD-10-CM | POA: Diagnosis not present

## 2020-07-14 DIAGNOSIS — Z1159 Encounter for screening for other viral diseases: Secondary | ICD-10-CM | POA: Diagnosis not present

## 2020-07-14 DIAGNOSIS — Z20828 Contact with and (suspected) exposure to other viral communicable diseases: Secondary | ICD-10-CM | POA: Diagnosis not present

## 2020-07-21 DIAGNOSIS — Z20828 Contact with and (suspected) exposure to other viral communicable diseases: Secondary | ICD-10-CM | POA: Diagnosis not present

## 2020-07-21 DIAGNOSIS — Z1159 Encounter for screening for other viral diseases: Secondary | ICD-10-CM | POA: Diagnosis not present

## 2020-07-22 DIAGNOSIS — M810 Age-related osteoporosis without current pathological fracture: Secondary | ICD-10-CM | POA: Diagnosis not present

## 2020-07-22 DIAGNOSIS — E559 Vitamin D deficiency, unspecified: Secondary | ICD-10-CM | POA: Diagnosis not present

## 2020-07-22 DIAGNOSIS — E785 Hyperlipidemia, unspecified: Secondary | ICD-10-CM | POA: Diagnosis not present

## 2020-07-22 DIAGNOSIS — G2581 Restless legs syndrome: Secondary | ICD-10-CM | POA: Diagnosis not present

## 2020-07-22 DIAGNOSIS — K219 Gastro-esophageal reflux disease without esophagitis: Secondary | ICD-10-CM | POA: Diagnosis not present

## 2020-07-22 DIAGNOSIS — I10 Essential (primary) hypertension: Secondary | ICD-10-CM | POA: Diagnosis not present

## 2020-07-22 DIAGNOSIS — I700 Atherosclerosis of aorta: Secondary | ICD-10-CM | POA: Diagnosis not present

## 2020-07-22 DIAGNOSIS — R062 Wheezing: Secondary | ICD-10-CM | POA: Diagnosis not present

## 2020-07-22 DIAGNOSIS — R061 Stridor: Secondary | ICD-10-CM | POA: Diagnosis not present

## 2020-07-22 DIAGNOSIS — Z8673 Personal history of transient ischemic attack (TIA), and cerebral infarction without residual deficits: Secondary | ICD-10-CM | POA: Diagnosis not present

## 2020-07-22 DIAGNOSIS — Z0000 Encounter for general adult medical examination without abnormal findings: Secondary | ICD-10-CM | POA: Diagnosis not present

## 2020-07-23 ENCOUNTER — Other Ambulatory Visit: Payer: Self-pay | Admitting: Family Medicine

## 2020-07-23 DIAGNOSIS — M810 Age-related osteoporosis without current pathological fracture: Secondary | ICD-10-CM

## 2020-07-28 DIAGNOSIS — Z1159 Encounter for screening for other viral diseases: Secondary | ICD-10-CM | POA: Diagnosis not present

## 2020-07-28 DIAGNOSIS — Z20828 Contact with and (suspected) exposure to other viral communicable diseases: Secondary | ICD-10-CM | POA: Diagnosis not present

## 2020-08-04 DIAGNOSIS — Z20828 Contact with and (suspected) exposure to other viral communicable diseases: Secondary | ICD-10-CM | POA: Diagnosis not present

## 2020-08-04 DIAGNOSIS — Z1159 Encounter for screening for other viral diseases: Secondary | ICD-10-CM | POA: Diagnosis not present

## 2020-08-07 DIAGNOSIS — H9193 Unspecified hearing loss, bilateral: Secondary | ICD-10-CM | POA: Diagnosis not present

## 2020-08-11 DIAGNOSIS — Z1159 Encounter for screening for other viral diseases: Secondary | ICD-10-CM | POA: Diagnosis not present

## 2020-08-11 DIAGNOSIS — Z20828 Contact with and (suspected) exposure to other viral communicable diseases: Secondary | ICD-10-CM | POA: Diagnosis not present

## 2020-08-18 DIAGNOSIS — Z20828 Contact with and (suspected) exposure to other viral communicable diseases: Secondary | ICD-10-CM | POA: Diagnosis not present

## 2020-08-18 DIAGNOSIS — Z1159 Encounter for screening for other viral diseases: Secondary | ICD-10-CM | POA: Diagnosis not present

## 2020-08-25 DIAGNOSIS — Z20828 Contact with and (suspected) exposure to other viral communicable diseases: Secondary | ICD-10-CM | POA: Diagnosis not present

## 2020-08-25 DIAGNOSIS — Z1159 Encounter for screening for other viral diseases: Secondary | ICD-10-CM | POA: Diagnosis not present

## 2020-09-01 DIAGNOSIS — Z20828 Contact with and (suspected) exposure to other viral communicable diseases: Secondary | ICD-10-CM | POA: Diagnosis not present

## 2020-09-01 DIAGNOSIS — Z1159 Encounter for screening for other viral diseases: Secondary | ICD-10-CM | POA: Diagnosis not present

## 2020-09-03 DIAGNOSIS — H04123 Dry eye syndrome of bilateral lacrimal glands: Secondary | ICD-10-CM | POA: Diagnosis not present

## 2020-09-03 DIAGNOSIS — H524 Presbyopia: Secondary | ICD-10-CM | POA: Diagnosis not present

## 2020-09-03 DIAGNOSIS — H52223 Regular astigmatism, bilateral: Secondary | ICD-10-CM | POA: Diagnosis not present

## 2020-09-03 DIAGNOSIS — H18593 Other hereditary corneal dystrophies, bilateral: Secondary | ICD-10-CM | POA: Diagnosis not present

## 2020-09-03 DIAGNOSIS — Z961 Presence of intraocular lens: Secondary | ICD-10-CM | POA: Diagnosis not present

## 2020-09-03 DIAGNOSIS — H35033 Hypertensive retinopathy, bilateral: Secondary | ICD-10-CM | POA: Diagnosis not present

## 2020-09-03 DIAGNOSIS — H5213 Myopia, bilateral: Secondary | ICD-10-CM | POA: Diagnosis not present

## 2020-09-08 ENCOUNTER — Encounter: Payer: Self-pay | Admitting: Podiatry

## 2020-09-08 ENCOUNTER — Other Ambulatory Visit: Payer: Self-pay

## 2020-09-08 ENCOUNTER — Ambulatory Visit (INDEPENDENT_AMBULATORY_CARE_PROVIDER_SITE_OTHER): Payer: Medicare Other | Admitting: Podiatry

## 2020-09-08 DIAGNOSIS — Z20828 Contact with and (suspected) exposure to other viral communicable diseases: Secondary | ICD-10-CM | POA: Diagnosis not present

## 2020-09-08 DIAGNOSIS — Z1159 Encounter for screening for other viral diseases: Secondary | ICD-10-CM | POA: Diagnosis not present

## 2020-09-08 DIAGNOSIS — M79675 Pain in left toe(s): Secondary | ICD-10-CM

## 2020-09-08 DIAGNOSIS — B351 Tinea unguium: Secondary | ICD-10-CM | POA: Diagnosis not present

## 2020-09-08 DIAGNOSIS — L84 Corns and callosities: Secondary | ICD-10-CM

## 2020-09-08 DIAGNOSIS — M2042 Other hammer toe(s) (acquired), left foot: Secondary | ICD-10-CM

## 2020-09-08 DIAGNOSIS — D689 Coagulation defect, unspecified: Secondary | ICD-10-CM | POA: Diagnosis not present

## 2020-09-08 DIAGNOSIS — M79674 Pain in right toe(s): Secondary | ICD-10-CM

## 2020-09-08 DIAGNOSIS — M2041 Other hammer toe(s) (acquired), right foot: Secondary | ICD-10-CM

## 2020-09-10 NOTE — Progress Notes (Signed)
Subjective:  Patient ID: Cassidy Ellison, female    DOB: 1922-04-05,  MRN: [id number]  87 y.o. female presents at risk foot care with h/o clotting disorder and corn(s) b/l feet , callus(es) b/l feet and painful mycotic nails.  Pain interferes with ambulation. Aggravating factors include wearing enclosed shoe gear. Painful toenails interfere with ambulation. Aggravating factors include wearing enclosed shoe gear. Pain is relieved with periodic professional debridement. Painful corns and calluses are aggravated when weightbearing with and without shoegear. Pain is relieved with periodic professional debridement..  Pain interferes with ambulation. Aggravating factors include wearing enclosed shoe gear. Pain is relieved with periodic professional debridement.  PCP is Dr. Harlan Stains and last visit was 08/03/2020.  She relates no new pedal concerns on today's visit.   Current Outpatient Medications:  .  umeclidinium-vilanterol (ANORO ELLIPTA) 62.5-25 MCG/INH AEPB, 1 puff, Disp: , Rfl:  .  alendronate (FOSAMAX) 70 MG tablet, , Disp: , Rfl:  .  amLODipine (NORVASC) 5 MG tablet, Take 5 mg by mouth daily. , Disp: , Rfl:  .  atorvastatin (LIPITOR) 20 MG tablet, Take 20 mg by mouth daily. , Disp: , Rfl:  .  betamethasone dipropionate (DIPROLENE) 0.05 % ointment, Apply 1 application topically 2 (two) times daily. , Disp: , Rfl: 2 .  Calcium Carbonate (CALCIUM 600 PO), Take 600 mg by mouth daily., Disp: , Rfl:  .  Cholecalciferol (VITAMIN D-3) 1000 units CAPS, Take 1,000 Units by mouth daily., Disp: , Rfl:  .  clopidogrel (PLAVIX) 75 MG tablet, Take 1 tablet (75 mg total) by mouth daily., Disp: 90 tablet, Rfl: 3 .  docusate sodium (COLACE) 100 MG capsule, 1 capsule as needed, Disp: , Rfl:  .  fexofenadine (ALLEGRA) 180 MG tablet, Take 180 mg by mouth daily., Disp: , Rfl:  .  fluticasone (FLONASE) 50 MCG/ACT nasal spray, Place 2 sprays into both nostrils daily., Disp: 16 g, Rfl: 5 .  losartan (COZAAR) 100 MG  tablet, Take 100 mg by mouth daily., Disp: , Rfl:  .  metoprolol succinate (TOPROL-XL) 50 MG 24 hr tablet, Take 1 tablet (50 mg total) by mouth daily., Disp: 30 tablet, Rfl: 3 .  Multiple Vitamins-Minerals (MULTIVITAMIN WITH MINERALS) tablet, Take 1 tablet by mouth daily. , Disp: , Rfl:  .  Omega-3 Fatty Acids (FISH OIL) 1000 MG CAPS, Take by mouth., Disp: , Rfl:  .  pantoprazole (PROTONIX) 40 MG tablet, , Disp: , Rfl:  .  polyethylene glycol powder (GLYCOLAX/MIRALAX) 17 GM/SCOOP powder, Take by mouth., Disp: , Rfl:  .  rOPINIRole (REQUIP) 1 MG tablet, Take 1 mg by mouth at bedtime., Disp: , Rfl:  .  triamcinolone cream (KENALOG) 0.1 %, APPLY TO GROIN AREA ONCE DAILY AS NEEDED FOR ITCH, Disp: , Rfl:  .  vitamin C (ASCORBIC ACID) 500 MG tablet, Take 500 mg by mouth daily., Disp: , Rfl:   Allergies  Allergen Reactions  . Corticosteroids Other (See Comments)    Caused patient to have low sodium levels  . Adhesive [Tape] Itching  . Chlorthalidone Other (See Comments)    creatinine  increases Other reaction(s): elevated creatinine Other reaction(s): elevated creatinine  . Denture Adhesive     Other reaction(s): rash  . Hydrochlorothiazide Other (See Comments)    CREATININE INCREASES Other reaction(s): elevated creatinine Other reaction(s): elevated creatinine  . Methoxsalen     Other reaction(s): cream--rash Other reaction(s): cream--rash  . Other     Other reaction(s): rash  . Prednisone     Other  reaction(s): hyponatremia Other reaction(s): hyponatremia    Review of Systems: Negative except as noted in the HPI.  Objective:   Constitutional Pt is a pleasant 87 y.o. Caucasian female WD, WN in NAD. AAO x 3.   Vascular Neurovascular status intact b/l lower extremities. Palpable pedal pulses b/l LE. Lower extremity skin temperature gradient within normal limits. No edema noted b/l lower extremities.  Neurologic Protective sensation intact 5/5 intact bilaterally with 10g monofilament  b/l.  Dermatologic Pedal skin with normal turgor, texture and tone bilaterally. No open wounds bilaterally. No interdigital macerations bilaterally. Toenails 1-5 b/l elongated, discolored, dystrophic, thickened, crumbly with subungual debris and tenderness to dorsal palpation. Hyperkeratotic lesion(s) L hallux, L 2nd toe, L 3rd toe, R 2nd toe, R 3rd toe and submet head 1 left foot.  No erythema, no edema, no drainage, no fluctuance.  Orthopedic: Normal muscle strength 5/5 to all lower extremity muscle groups bilaterally. No pain crepitus or joint limitation noted with ROM b/l. Hammertoes noted to the 2-5 bilaterally.   Radiographs: None Assessment:   1. Pain due to onychomycosis of toenails of both feet   2. Corns and callosities   3. Acquired hammertoes of both feet   4. Blood clotting disorder ([place])    Plan:  Patient was evaluated and treated and all questions answered.  Onychomycosis with pain -Nails palliatively debridement as below. -Educated on self-care  Procedure: Nail Debridement Rationale: Pain Type of Debridement: manual, sharp debridement. Instrumentation: Nail nipper, rotary burr. Number of Nails: 10  -Examined patient. -Patient to continue soft, supportive shoe gear daily. -Corns L 2nd toe, L 3rd toe, R 2nd toe, R 3rd toe and calluses L hallux and submet head 1 left foot pared without incident. -Toenails 1-5 b/l were debrided in length and girth with sterile nail nippers and dremel without iatrogenic bleeding.  -Patient to report any pedal injuries to medical professional immediately. -Patient/POA to call should there be question/concern in the interim.  Return in about 9 weeks (around 11/10/2020).  Marzetta Board, DPM

## 2020-09-15 DIAGNOSIS — Z20828 Contact with and (suspected) exposure to other viral communicable diseases: Secondary | ICD-10-CM | POA: Diagnosis not present

## 2020-09-15 DIAGNOSIS — Z1159 Encounter for screening for other viral diseases: Secondary | ICD-10-CM | POA: Diagnosis not present

## 2020-09-22 DIAGNOSIS — Z20828 Contact with and (suspected) exposure to other viral communicable diseases: Secondary | ICD-10-CM | POA: Diagnosis not present

## 2020-09-22 DIAGNOSIS — Z1159 Encounter for screening for other viral diseases: Secondary | ICD-10-CM | POA: Diagnosis not present

## 2020-09-29 DIAGNOSIS — Z1159 Encounter for screening for other viral diseases: Secondary | ICD-10-CM | POA: Diagnosis not present

## 2020-09-29 DIAGNOSIS — Z20828 Contact with and (suspected) exposure to other viral communicable diseases: Secondary | ICD-10-CM | POA: Diagnosis not present

## 2020-10-06 DIAGNOSIS — Z20828 Contact with and (suspected) exposure to other viral communicable diseases: Secondary | ICD-10-CM | POA: Diagnosis not present

## 2020-10-06 DIAGNOSIS — Z1159 Encounter for screening for other viral diseases: Secondary | ICD-10-CM | POA: Diagnosis not present

## 2020-10-13 DIAGNOSIS — Z20828 Contact with and (suspected) exposure to other viral communicable diseases: Secondary | ICD-10-CM | POA: Diagnosis not present

## 2020-10-13 DIAGNOSIS — Z1159 Encounter for screening for other viral diseases: Secondary | ICD-10-CM | POA: Diagnosis not present

## 2020-10-20 DIAGNOSIS — Z1159 Encounter for screening for other viral diseases: Secondary | ICD-10-CM | POA: Diagnosis not present

## 2020-10-20 DIAGNOSIS — Z20828 Contact with and (suspected) exposure to other viral communicable diseases: Secondary | ICD-10-CM | POA: Diagnosis not present

## 2020-10-27 DIAGNOSIS — Z1159 Encounter for screening for other viral diseases: Secondary | ICD-10-CM | POA: Diagnosis not present

## 2020-10-27 DIAGNOSIS — Z20828 Contact with and (suspected) exposure to other viral communicable diseases: Secondary | ICD-10-CM | POA: Diagnosis not present

## 2020-11-03 DIAGNOSIS — Z1159 Encounter for screening for other viral diseases: Secondary | ICD-10-CM | POA: Diagnosis not present

## 2020-11-03 DIAGNOSIS — Z20828 Contact with and (suspected) exposure to other viral communicable diseases: Secondary | ICD-10-CM | POA: Diagnosis not present

## 2020-11-10 DIAGNOSIS — Z20828 Contact with and (suspected) exposure to other viral communicable diseases: Secondary | ICD-10-CM | POA: Diagnosis not present

## 2020-11-10 DIAGNOSIS — Z1159 Encounter for screening for other viral diseases: Secondary | ICD-10-CM | POA: Diagnosis not present

## 2020-11-12 ENCOUNTER — Other Ambulatory Visit: Payer: Self-pay

## 2020-11-12 ENCOUNTER — Encounter: Payer: Self-pay | Admitting: Podiatry

## 2020-11-12 ENCOUNTER — Ambulatory Visit (INDEPENDENT_AMBULATORY_CARE_PROVIDER_SITE_OTHER): Payer: Medicare Other | Admitting: Podiatry

## 2020-11-12 DIAGNOSIS — D689 Coagulation defect, unspecified: Secondary | ICD-10-CM

## 2020-11-12 DIAGNOSIS — L84 Corns and callosities: Secondary | ICD-10-CM

## 2020-11-12 DIAGNOSIS — M778 Other enthesopathies, not elsewhere classified: Secondary | ICD-10-CM | POA: Diagnosis not present

## 2020-11-12 DIAGNOSIS — B351 Tinea unguium: Secondary | ICD-10-CM | POA: Diagnosis not present

## 2020-11-12 DIAGNOSIS — M79675 Pain in left toe(s): Secondary | ICD-10-CM | POA: Diagnosis not present

## 2020-11-12 DIAGNOSIS — M79674 Pain in right toe(s): Secondary | ICD-10-CM

## 2020-11-12 NOTE — Patient Instructions (Addendum)
Capsulitis of the 2nd toe joint may required local steroid injection. Call office and schedule appointment with Dr. Daylene Katayama if this continues to bother you.  Morton Neuralgia (Neuroma)  Morton neuralgia is foot pain that affects the ball of the foot and the area near the toes. Morton neuralgia occurs when part of a nerve in the foot (digital nerve) is under too much pressure (compressed). When this happens over a long period of time, the nerve can thicken (neuroma) and cause pain. Pain usually occurs between the third and fourth toes.  Morton neuralgia can come and go but may get worse over time. What are the causes? This condition is caused by doing the same things over and over with your foot, such as: Activities such as running or jumping. Wearing shoes that are too tight. What increases the risk? You may be at higher risk for Morton neuralgia if you: Are female. Wear high heels. Wear shoes that are narrow or tight. Do activities that repeatedly stretch your toes, such as: Running. Ballet. Long-distance walking. What are the signs or symptoms? The first symptom of Morton neuralgia is pain that spreads from the ball of the foot to the toes. It may feel like you are walking on a marble. Pain usually gets worse with walking and goes away at night. Other symptoms may include numbness and cramping of your toes. Both feet are equally affected, but rarelyat the same time. How is this diagnosed? This condition is diagnosed based on your symptoms, your medical history, and a physical exam. Your health care provider may: Squeeze your foot just behind your toe. Ask you to move your toes to check for pain. Ask about your physical activity level. You also may have imaging tests, such as an X-ray, ultrasound, or MRI. How is this treated? Treatment depends on how severe your condition is and what causes it. Treatment may involve: Wearing different shoes that are not too tight, are low-heeled,  and provide good support. For some people, this is the only treatment needed. Wearing an over-the-counter or custom supportive pad (orthotic) under the front of your foot. Getting injections of numbing medicine and anti-inflammatory medicine (steroid) in the nerve. Having surgery to remove part of the thickened nerve. Follow these instructions at home: Managing pain, stiffness, and swelling  Massage your foot as needed. Wear orthotics as told by your health care provider. If directed, put ice on your foot: Put ice in a plastic bag. Place a towel between your skin and the bag. Leave the ice on for 20 minutes, 2-3 times a day. Avoid activities that cause pain or make pain worse. If you play sports, ask your health care provider when it is safe for you to return to sports. Raise (elevate) your foot above the level of your heart while lying down and, when possible, while sitting.  General instructions Take over-the-counter and prescription medicines only as told by your health care provider. Do not drive or use heavy machinery while taking prescription pain medicine. Wear shoes that: Have soft soles. Have a wide toe area. Provide arch support. Do not pinch or squeeze your feet. Have room for your orthotics, if applicable. Keep all follow-up visits as told by your health care provider. This is important. Contact a health care provider if: Your symptoms get worse or do not get better with treatment and home care. Summary Morton neuralgia is foot pain that affects the ball of the foot and the area near the toes. Pain usually occurs  between the third and fourth toes, gets worse with walking, and goes away at night. Morton neuralgia occurs when part of a nerve in the foot (digital nerve) is under too much pressure. When this happens over a long period of time, the nerve can thicken (neuroma) and cause pain. This condition is caused by doing the same things over and over with your foot, such as  running or jumping, wearing shoes that are too tight, or wearing high heels. Treatment may involve wearing low-heeled shoes that are not too tight, wearing a supportive pad (orthotic) under the front of your foot, getting injections in the nerve, or having surgery to remove part of the thickened nerve. This information is not intended to replace advice given to you by your health care provider. Make sure you discuss any questions you have with your healthcare provider. Document Revised: 05/10/2017 Document Reviewed: 05/10/2017 Elsevier Patient Education  2022 [employer].

## 2020-11-15 NOTE — Progress Notes (Signed)
Subjective: Cassidy Ellison is a pleasant 87 y.o. female patient seen today with h/o clotting disorder. She is seen for corns b/l feet, calluses of both feet and painful thick toenails that are difficult to trim. Pain interferes with ambulation. Aggravating factors include wearing enclosed shoe gear. Pain is relieved with periodic professional debridement.  She states she has had an episode of discomfort of the right foot where there was a stabbing pain between the great toe and 2nd digit. She cannot recall any preceding episodes of trauma.   PCP is Harlan Stains, MD. Last visit was: 05/28/2020.  She voices no new pedal problems on today's visit.  Allergies  Allergen Reactions   Corticosteroids Other (See Comments)    Caused patient to have low sodium levels   Adhesive [Tape] Itching   Chlorthalidone Other (See Comments)    creatinine  increases Other reaction(s): elevated creatinine Other reaction(s): elevated creatinine   Denture Adhesive     Other reaction(s): rash   Hydrochlorothiazide Other (See Comments)    CREATININE INCREASES Other reaction(s): elevated creatinine Other reaction(s): elevated creatinine   Methoxsalen     Other reaction(s): cream--rash Other reaction(s): cream--rash   Other     Other reaction(s): rash   Prednisone     Other reaction(s): hyponatremia Other reaction(s): hyponatremia    Objective: Physical Exam  General: MERCEDEES Ellison is a pleasant 87 y.o. Caucasian female, WD, WN in NAD. AAO x 3.   Vascular:  Capillary fill time to digits <3 seconds b/l lower extremities. Palpable pedal pulses b/l LE. Pedal hair absent. Lower extremity skin temperature gradient within normal limits. No edema noted b/l lower extremities.  Dermatological:  Pedal skin with normal turgor, texture and tone b/l lower extremities Toenails 1-5 b/l elongated, discolored, dystrophic, thickened, crumbly with subungual debris and tenderness to dorsal palpation. Hyperkeratotic lesion(s)  L hallux, L 2nd toe, L 3rd toe, R 2nd toe, R 3rd toe, and submet head 1 left foot.  No erythema, no edema, no drainage, no fluctuance.  Musculoskeletal:  Normal muscle strength 5/5 to all lower extremity muscle groups bilaterally. No pain crepitus or joint limitation noted with ROM b/l. Hammertoe(s) noted to the 2-5 bilaterally.  She does have tenderness to palpation of 2nd metatarsal dorsal and plantarly. No Mulder's click.  Neurological:  Protective sensation intact 5/5 intact bilaterally with 10g monofilament b/l. Vibratory sensation intact b/l.  Assessment and Plan:  1. Pain due to onychomycosis of toenails of both feet   2. Corns and callosities   3. Capsulitis of right foot   4. Blood clotting disorder ([place])    -Examined patient. -We discussed Capsulitis vs Morton's Neuroma as well as treatment options for both. She will think about it and schedule an appointment with Dr. Amalia Hailey if she has this recurrence of pain again. -Patient to continue soft, supportive shoe gear daily. -Toenails 1-5 b/l were debrided in length and girth with sterile nail nippers and dremel without iatrogenic bleeding.  -Corn(s) L 2nd toe, L 3rd toe, R 2nd toe, and R 3rd toe and callus(es) L hallux and submet head 1 left foot were pared utilizing sterile scalpel blade without incident. Total number debrided =6. -Patient to report any pedal injuries to medical professional immediately. -Patient/POA to call should there be question/concern in the interim.  Return in about 9 weeks (around 01/14/2021) for nail trim.  Marzetta Board, DPM

## 2020-11-17 DIAGNOSIS — Z1159 Encounter for screening for other viral diseases: Secondary | ICD-10-CM | POA: Diagnosis not present

## 2020-11-17 DIAGNOSIS — Z20828 Contact with and (suspected) exposure to other viral communicable diseases: Secondary | ICD-10-CM | POA: Diagnosis not present

## 2020-11-24 DIAGNOSIS — Z1159 Encounter for screening for other viral diseases: Secondary | ICD-10-CM | POA: Diagnosis not present

## 2020-11-24 DIAGNOSIS — Z20828 Contact with and (suspected) exposure to other viral communicable diseases: Secondary | ICD-10-CM | POA: Diagnosis not present

## 2020-12-08 DIAGNOSIS — Z1159 Encounter for screening for other viral diseases: Secondary | ICD-10-CM | POA: Diagnosis not present

## 2020-12-08 DIAGNOSIS — Z20828 Contact with and (suspected) exposure to other viral communicable diseases: Secondary | ICD-10-CM | POA: Diagnosis not present

## 2020-12-15 DIAGNOSIS — Z20828 Contact with and (suspected) exposure to other viral communicable diseases: Secondary | ICD-10-CM | POA: Diagnosis not present

## 2020-12-24 DIAGNOSIS — Z20828 Contact with and (suspected) exposure to other viral communicable diseases: Secondary | ICD-10-CM | POA: Diagnosis not present

## 2020-12-29 DIAGNOSIS — Z20828 Contact with and (suspected) exposure to other viral communicable diseases: Secondary | ICD-10-CM | POA: Diagnosis not present

## 2021-01-05 DIAGNOSIS — Z20828 Contact with and (suspected) exposure to other viral communicable diseases: Secondary | ICD-10-CM | POA: Diagnosis not present

## 2021-01-12 DIAGNOSIS — Z20828 Contact with and (suspected) exposure to other viral communicable diseases: Secondary | ICD-10-CM | POA: Diagnosis not present

## 2021-01-19 DIAGNOSIS — Z20828 Contact with and (suspected) exposure to other viral communicable diseases: Secondary | ICD-10-CM | POA: Diagnosis not present

## 2021-01-20 ENCOUNTER — Other Ambulatory Visit: Payer: Medicare Other

## 2021-01-22 DIAGNOSIS — R0609 Other forms of dyspnea: Secondary | ICD-10-CM | POA: Diagnosis not present

## 2021-01-22 DIAGNOSIS — E785 Hyperlipidemia, unspecified: Secondary | ICD-10-CM | POA: Diagnosis not present

## 2021-01-22 DIAGNOSIS — G2581 Restless legs syndrome: Secondary | ICD-10-CM | POA: Diagnosis not present

## 2021-01-22 DIAGNOSIS — Z23 Encounter for immunization: Secondary | ICD-10-CM | POA: Diagnosis not present

## 2021-01-22 DIAGNOSIS — M810 Age-related osteoporosis without current pathological fracture: Secondary | ICD-10-CM | POA: Diagnosis not present

## 2021-01-22 DIAGNOSIS — I10 Essential (primary) hypertension: Secondary | ICD-10-CM | POA: Diagnosis not present

## 2021-01-26 DIAGNOSIS — Z8616 Personal history of COVID-19: Secondary | ICD-10-CM | POA: Diagnosis not present

## 2021-02-02 ENCOUNTER — Ambulatory Visit (INDEPENDENT_AMBULATORY_CARE_PROVIDER_SITE_OTHER): Payer: Medicare Other | Admitting: Podiatry

## 2021-02-02 ENCOUNTER — Encounter: Payer: Self-pay | Admitting: Podiatry

## 2021-02-02 ENCOUNTER — Other Ambulatory Visit: Payer: Self-pay

## 2021-02-02 DIAGNOSIS — L84 Corns and callosities: Secondary | ICD-10-CM | POA: Diagnosis not present

## 2021-02-02 DIAGNOSIS — D689 Coagulation defect, unspecified: Secondary | ICD-10-CM | POA: Diagnosis not present

## 2021-02-02 DIAGNOSIS — M79675 Pain in left toe(s): Secondary | ICD-10-CM | POA: Diagnosis not present

## 2021-02-02 DIAGNOSIS — B351 Tinea unguium: Secondary | ICD-10-CM

## 2021-02-02 DIAGNOSIS — M79674 Pain in right toe(s): Secondary | ICD-10-CM | POA: Diagnosis not present

## 2021-02-02 DIAGNOSIS — Z8616 Personal history of COVID-19: Secondary | ICD-10-CM | POA: Diagnosis not present

## 2021-02-02 DIAGNOSIS — Z23 Encounter for immunization: Secondary | ICD-10-CM | POA: Diagnosis not present

## 2021-02-06 NOTE — Progress Notes (Signed)
Subjective: Cassidy Ellison is a pleasant 87 y.o. female patient seen today with h/o clotting disorder. She is seen for corns b/l feet, calluses of both feet and painful thick toenails that are difficult to trim. Pain interferes with ambulation. Aggravating factors include wearing enclosed shoe gear. Pain is relieved with periodic professional debridement.  PCP is Harlan Stains, MD. Last visit was: 07/22/2020.   Allergies  Allergen Reactions   Corticosteroids Other (See Comments)    Caused patient to have low sodium levels   Adhesive [Tape] Itching   Chlorthalidone Other (See Comments)    creatinine  increases Other reaction(s): elevated creatinine Other reaction(s): elevated creatinine   Denture Adhesive     Other reaction(s): rash   Hydrochlorothiazide Other (See Comments)    CREATININE INCREASES Other reaction(s): elevated creatinine Other reaction(s): elevated creatinine   Methoxsalen     Other reaction(s): cream--rash Other reaction(s): cream--rash   Other     Other reaction(s): rash   Prednisone     Other reaction(s): hyponatremia Other reaction(s): hyponatremia    Objective: Physical Exam  General: Cassidy Ellison is a pleasant 87 y.o. Caucasian female, WD, WN in NAD. AAO x 3.   Vascular:  Capillary fill time to digits <3 seconds b/l lower extremities. Palpable pedal pulses b/l LE. Pedal hair absent. Lower extremity skin temperature gradient within normal limits. No edema noted b/l lower extremities.  Dermatological:  Pedal skin with normal turgor, texture and tone b/l lower extremities Toenails 1-5 b/l elongated, discolored, dystrophic, thickened, crumbly with subungual debris and tenderness to dorsal palpation. Hyperkeratotic lesion(s) L hallux, L 2nd toe, L 3rd toe, R 2nd toe, R 3rd toe, and submet head 1 left foot.  No erythema, no edema, no drainage, no fluctuance.  Musculoskeletal:  Normal muscle strength 5/5 to all lower extremity muscle groups bilaterally. Severe  hammertoe deformity noted 2-5 bilaterally.  Neurological:  Protective sensation intact 5/5 intact bilaterally with 10g monofilament b/l. Vibratory sensation intact b/l.  Assessment and Plan:  1. Pain due to onychomycosis of toenails of both feet   2. Corns and callosities   3. Blood clotting disorder (HCC)    -Examined patient. -Patient to continue soft, supportive shoe gear daily. -We did revisit neuroma signs/symptoms. She does not want to see Dr. Amalia Hailey presently, but will let us know if she would like to in the future. -Toenails 1-5 b/l were debrided in length and girth with sterile nail nippers and dremel without iatrogenic bleeding. Pinpoint bleeding of right 2nd toe addressed with Lumicain Hemostatic Solution. Triple antibiotic ointment applied. She was instructed to apply Neosporin to right 2nd toe once daily for one week. Call office if she has any problems. -Corn(s) L 2nd toe, L 3rd toe, R 2nd toe, and R 3rd toe and callus(es) L hallux and submet head 1 left foot were pared utilizing sterile scalpel blade without incident. Total number debrided =6. -Patient to report any pedal injuries to medical professional immediately. -Patient/POA to call should there be question/concern in the interim.  Return in about 3 months (around 05/04/2021).  Marzetta Board, DPM

## 2021-02-09 DIAGNOSIS — Z20828 Contact with and (suspected) exposure to other viral communicable diseases: Secondary | ICD-10-CM | POA: Diagnosis not present

## 2021-02-16 DIAGNOSIS — Z20828 Contact with and (suspected) exposure to other viral communicable diseases: Secondary | ICD-10-CM | POA: Diagnosis not present

## 2021-02-23 DIAGNOSIS — Z8616 Personal history of COVID-19: Secondary | ICD-10-CM | POA: Diagnosis not present

## 2021-03-02 DIAGNOSIS — Z20828 Contact with and (suspected) exposure to other viral communicable diseases: Secondary | ICD-10-CM | POA: Diagnosis not present

## 2021-03-09 DIAGNOSIS — Z8616 Personal history of COVID-19: Secondary | ICD-10-CM | POA: Diagnosis not present

## 2021-03-16 DIAGNOSIS — Z20828 Contact with and (suspected) exposure to other viral communicable diseases: Secondary | ICD-10-CM | POA: Diagnosis not present

## 2021-03-23 DIAGNOSIS — Z20822 Contact with and (suspected) exposure to covid-19: Secondary | ICD-10-CM | POA: Diagnosis not present

## 2021-03-30 DIAGNOSIS — Z20828 Contact with and (suspected) exposure to other viral communicable diseases: Secondary | ICD-10-CM | POA: Diagnosis not present

## 2021-04-06 DIAGNOSIS — Z20828 Contact with and (suspected) exposure to other viral communicable diseases: Secondary | ICD-10-CM | POA: Diagnosis not present

## 2021-04-06 DIAGNOSIS — Z1159 Encounter for screening for other viral diseases: Secondary | ICD-10-CM | POA: Diagnosis not present

## 2021-04-08 ENCOUNTER — Encounter: Payer: Self-pay | Admitting: Podiatry

## 2021-04-08 ENCOUNTER — Ambulatory Visit (INDEPENDENT_AMBULATORY_CARE_PROVIDER_SITE_OTHER): Payer: Medicare Other | Admitting: Podiatry

## 2021-04-08 ENCOUNTER — Other Ambulatory Visit: Payer: Self-pay

## 2021-04-08 DIAGNOSIS — D689 Coagulation defect, unspecified: Secondary | ICD-10-CM

## 2021-04-08 DIAGNOSIS — M778 Other enthesopathies, not elsewhere classified: Secondary | ICD-10-CM

## 2021-04-08 DIAGNOSIS — G5761 Lesion of plantar nerve, right lower limb: Secondary | ICD-10-CM | POA: Diagnosis not present

## 2021-04-08 DIAGNOSIS — L84 Corns and callosities: Secondary | ICD-10-CM

## 2021-04-08 DIAGNOSIS — M79674 Pain in right toe(s): Secondary | ICD-10-CM

## 2021-04-08 DIAGNOSIS — B351 Tinea unguium: Secondary | ICD-10-CM

## 2021-04-09 DIAGNOSIS — L84 Corns and callosities: Secondary | ICD-10-CM | POA: Insufficient documentation

## 2021-04-09 NOTE — Progress Notes (Signed)
Subjective:  Patient ID: Cassidy Ellison, female    DOB: [birth year],  MRN: [id number]  Cassidy Ellison presents to clinic today for at risk foot care with h/o clotting disorder, long term blood thinner, Plavix, and presents today with painful, discolored, thick toenails which interfere with daily activities, and corn(s) bilaterally , callus(es) bilaterally and painful mycotic nails.  Pain interferes with ambulation. Aggravating factors include wearing enclosed shoe gear. Painful toenails interfere with ambulation. Aggravating factors include wearing enclosed shoe gear. Pain is relieved with periodic professional debridement. Painful corns and calluses are aggravated when weightbearing with and without shoegear. Pain is relieved with periodic professional debridement.  Patient has brought in After Visit Summary from her July appointment. At that time, working diagnoses for her painful right foot were capsulitis 1st MPJ right foot vs Morton's Neuroma 1st webspace right foot. She was advised to schedule follow up appointment with Dr. Daylene Katayama. She relates she would like to have that work up now since her symptoms have returned. She relates pain at times described as cramping on bottom of her right foot 1st webspace. Sometimes it feels "raw" like a sore despite not having a wound.  PCP is Harlan Stains, MD , and last visit was 02/04/2021.  Allergies  Allergen Reactions   Corticosteroids Other (See Comments)    Caused patient to have low sodium levels   Adhesive [Tape] Itching   Chlorthalidone Other (See Comments)    creatinine  increases Other reaction(s): elevated creatinine Other reaction(s): elevated creatinine Other reaction(s): elevated creatinine   Denture Adhesive     Other reaction(s): rash   Hydrochlorothiazide Other (See Comments)    CREATININE INCREASES Other reaction(s): elevated creatinine Other reaction(s): elevated creatinine Other reaction(s): elevated creatinine   Methoxsalen      Other reaction(s): cream--rash Other reaction(s): cream--rash Other reaction(s): cream--rash   Other     Other reaction(s): rash Other reaction(s): rash   Prednisone     Other reaction(s): hyponatremia Other reaction(s): hyponatremia Other reaction(s): hyponatremia    Review of Systems: Negative except as noted in the HPI. Objective:   Constitutional Cassidy Ellison is a pleasant 87 y.o. Caucasian female, WD, WN in NAD. AAO x 3.   Vascular CFT <3 seconds b/l LE. Palpable DP/PT pulses b/l LE. Digital hair absent b/l. Skin temperature gradient WNL b/l. No pain with calf compression b/l. No edema noted b/l. No cyanosis or clubbing noted b/l LE.  Neurologic Normal speech. Oriented to person, place, and time. Protective sensation intact 5/5 intact bilaterally with 10g monofilament b/l. Vibratory sensation intact b/l.  Dermatologic Pedal integument with normal turgor, texture and tone b/l LE. No open wounds b/l. No interdigital macerations b/l. Toenails 1-5 b/l elongated, thickened, discolored with subungual debris. +Tenderness with dorsal palpation of nailplates. Hyperkeratotic lesion(s) noted bilateral great toes, R 2nd toe, R 3rd toe, and submet head 1 left foot. Preulcerative lesion noted L 2nd toe and L 3rd toe. There is visible subdermal hemorrhage. There is no surrounding erythema, no edema, no drainage, no odor, no fluctuance.  Orthopedic: Normal muscle strength 5/5 to all lower extremity muscle groups bilaterally. HAV with bunion deformity noted b/l LE. Severe hammertoe deformity noted 2-5 bilaterally. Crossover hammertoe deformity noted L 2nd toe.Marland Kitchen No pain, crepitus or joint limitation noted with ROM b/l LE.  Patient ambulates independently without assistive aids.   Radiographs: None   Assessment:   1. Pain due to onychomycosis of toenails of both feet   2. Corns and callosities  3. Pre-ulcerative corn or callous   4. Blood clotting disorder ([place])   5. Morton's neuroma of right foot    6. Capsulitis of right foot    Plan:  Patient was evaluated and treated and all questions answered. Consent given for treatment as described below: -For painful right foot, will refer her to Dr. Daylene Katayama for further workup of neuroma 1st webspace vs capsulitis right 1st MPJ . -Mycotic toenails 1-5 bilaterally were debrided in length and girth with sterile nail nippers and dremel without incident. -Corn(s) R 2nd toe and R 3rd toe and callus(es) bilateral great toes and submet head 1 left foot were pared utilizing sterile scalpel blade without incident. Total number debrided =4. -Preulcerative lesion pared L 2nd toe and L 3rd toe. Total number pared=2. -Dispensed digital toe cap for left great toe. Apply to L hallux every morning. Remove every evening. -Patient/POA to call should there be question/concern in the interim.  Return in about 3 months (around 07/07/2021).  Marzetta Board, DPM

## 2021-04-13 DIAGNOSIS — Z20828 Contact with and (suspected) exposure to other viral communicable diseases: Secondary | ICD-10-CM | POA: Diagnosis not present

## 2021-04-13 DIAGNOSIS — Z1159 Encounter for screening for other viral diseases: Secondary | ICD-10-CM | POA: Diagnosis not present

## 2021-04-20 ENCOUNTER — Other Ambulatory Visit: Payer: Self-pay

## 2021-04-20 ENCOUNTER — Ambulatory Visit (INDEPENDENT_AMBULATORY_CARE_PROVIDER_SITE_OTHER): Payer: Medicare Other | Admitting: Podiatry

## 2021-04-20 DIAGNOSIS — M2042 Other hammer toe(s) (acquired), left foot: Secondary | ICD-10-CM

## 2021-04-20 DIAGNOSIS — M2041 Other hammer toe(s) (acquired), right foot: Secondary | ICD-10-CM | POA: Diagnosis not present

## 2021-04-20 DIAGNOSIS — L84 Corns and callosities: Secondary | ICD-10-CM

## 2021-04-20 DIAGNOSIS — M2010 Hallux valgus (acquired), unspecified foot: Secondary | ICD-10-CM | POA: Diagnosis not present

## 2021-04-20 MED ORDER — CYCLOBENZAPRINE HCL 5 MG PO TABS: 5.0000 mg | ORAL_TABLET | Freq: Every day | ORAL | 1 refills | Status: DC

## 2021-04-20 NOTE — Progress Notes (Signed)
   Subjective: 87 y.o. female presenting today for evaluation of calluses that developed to the patient's feet.  Patient comes into the office routinely for routine foot care.  She would like to discuss the calluses that developed as well as some intermittent random nocturnal cramping to her feet.  She presents for further treatment evaluation  Past Medical History:  Diagnosis Date   Carpal tunnel syndrome    left   DVT (deep venous thrombosis) (HCC)    GERD (gastroesophageal reflux disease)    Hyperlipemia    Hypertension    RLS (restless legs syndrome)    Past Surgical History:  Procedure Laterality Date   ABDOMINAL HYSTERECTOMY     CARPAL TUNNEL RELEASE Left 10/31/2014   Procedure: LEFT CARPAL TUNNEL RELEASE;  Surgeon: Leanora Cover, MD;  Location: [facility];  Service: Orthopedics;  Laterality: Left;   CARPAL TUNNEL RELEASE Right 02/12/2016   Procedure: CARPAL TUNNEL RELEASE, right;  Surgeon: Leanora Cover, MD;  Location: [facility];  Service: Orthopedics;  Laterality: Right;   GALLBLADDER SURGERY       Objective: Physical Exam General: The patient is alert and oriented x3 in no acute distress.  Dermatology: Skin is cool, dry and supple bilateral lower extremities. Negative for open lesions or macerations.  There are some hyperkeratotic calluses associated to the distal tips of the bilateral toes correlating with the patient's bunion and hammertoe deformity.  Vascular: Palpable pedal pulses bilaterally. No edema or erythema noted. Capillary refill within normal limits.  Neurological: Epicritic and protective threshold grossly intact bilaterally.   Musculoskeletal Exam: Clinical evidence of bunion deformity noted to the respective foot. There is moderate pain on palpation range of motion of the first MPJ. Lateral deviation of the hallux noted consistent with hallux abductovalgus. Hammertoe contracture also noted on clinical exam to digits 2-5 of the  bilateral foot. Symptomatic pain on palpation and range of motion also noted to the metatarsal phalangeal joints of the respective hammertoe digits.    Assessment: 1. HAV w/ bunion deformity bilateral 2. Hammertoe deformity 2-5 bilateral 3.  Symptomatic calluses bilateral 4.  Intermittent nocturnal foot cramps   Plan of Care:  1. Patient was evaluated.  2.  Continue routine foot care with our routine foot care physician, Dr. Adah Perl. 3.  Silicone toe spacers were provided to provide space and cushioning between the hallux and second toe bilateral 4.  Continue wearing good supportive shoes and Aetrex insoles.  5.  In order to address the severe nocturnal foot cramps that the patient gets at night, I did prescribe a prescription for Flexeril 5 mg nightly to see if this helps alleviate some of her symptoms.  If this does help she may need to follow-up with her PCP for long-term management 6.  Return to clinic for next scheduled routine foot care appointment   Edrick Kins, DPM [facility]  Dr. Edrick Kins, DPM    [address], [place] [address]                Office [phone]  Fax [phone]

## 2021-04-22 DIAGNOSIS — Z20828 Contact with and (suspected) exposure to other viral communicable diseases: Secondary | ICD-10-CM | POA: Diagnosis not present

## 2021-04-22 DIAGNOSIS — Z1159 Encounter for screening for other viral diseases: Secondary | ICD-10-CM | POA: Diagnosis not present

## 2021-04-24 DIAGNOSIS — Z1159 Encounter for screening for other viral diseases: Secondary | ICD-10-CM | POA: Diagnosis not present

## 2021-04-24 DIAGNOSIS — Z20828 Contact with and (suspected) exposure to other viral communicable diseases: Secondary | ICD-10-CM | POA: Diagnosis not present

## 2021-04-25 DIAGNOSIS — U071 COVID-19: Secondary | ICD-10-CM | POA: Diagnosis not present

## 2021-04-26 ENCOUNTER — Encounter (HOSPITAL_COMMUNITY): Payer: Self-pay

## 2021-04-26 ENCOUNTER — Inpatient Hospital Stay (HOSPITAL_COMMUNITY)
Admission: EM | Admit: 2021-04-26 | Discharge: 2021-04-29 | DRG: 177 | Disposition: A | Discharge status: Home Health | Payer: Medicare Other | Attending: Family Medicine | Admitting: Family Medicine

## 2021-04-26 ENCOUNTER — Observation Stay (HOSPITAL_COMMUNITY): Payer: Medicare Other

## 2021-04-26 ENCOUNTER — Emergency Department (HOSPITAL_COMMUNITY): Payer: Medicare Other

## 2021-04-26 DIAGNOSIS — I10 Essential (primary) hypertension: Secondary | ICD-10-CM | POA: Diagnosis not present

## 2021-04-26 DIAGNOSIS — Z888 Allergy status to other drugs, medicaments and biological substances status: Secondary | ICD-10-CM

## 2021-04-26 DIAGNOSIS — Z8249 Family history of ischemic heart disease and other diseases of the circulatory system: Secondary | ICD-10-CM

## 2021-04-26 DIAGNOSIS — F05 Delirium due to known physiological condition: Secondary | ICD-10-CM | POA: Diagnosis present

## 2021-04-26 DIAGNOSIS — J9811 Atelectasis: Secondary | ICD-10-CM | POA: Diagnosis present

## 2021-04-26 DIAGNOSIS — Z91048 Other nonmedicinal substance allergy status: Secondary | ICD-10-CM | POA: Diagnosis not present

## 2021-04-26 DIAGNOSIS — G9341 Metabolic encephalopathy: Secondary | ICD-10-CM | POA: Diagnosis present

## 2021-04-26 DIAGNOSIS — G2581 Restless legs syndrome: Secondary | ICD-10-CM | POA: Diagnosis present

## 2021-04-26 DIAGNOSIS — J9601 Acute respiratory failure with hypoxia: Secondary | ICD-10-CM | POA: Diagnosis present

## 2021-04-26 DIAGNOSIS — E785 Hyperlipidemia, unspecified: Secondary | ICD-10-CM | POA: Diagnosis present

## 2021-04-26 DIAGNOSIS — J1282 Pneumonia due to coronavirus disease 2019: Secondary | ICD-10-CM | POA: Diagnosis present

## 2021-04-26 DIAGNOSIS — Z66 Do not resuscitate: Secondary | ICD-10-CM | POA: Diagnosis present

## 2021-04-26 DIAGNOSIS — Z87891 Personal history of nicotine dependence: Secondary | ICD-10-CM | POA: Diagnosis not present

## 2021-04-26 DIAGNOSIS — E222 Syndrome of inappropriate secretion of antidiuretic hormone: Secondary | ICD-10-CM | POA: Diagnosis present

## 2021-04-26 DIAGNOSIS — D696 Thrombocytopenia, unspecified: Secondary | ICD-10-CM | POA: Diagnosis present

## 2021-04-26 DIAGNOSIS — Z79899 Other long term (current) drug therapy: Secondary | ICD-10-CM

## 2021-04-26 DIAGNOSIS — R0689 Other abnormalities of breathing: Secondary | ICD-10-CM | POA: Diagnosis not present

## 2021-04-26 DIAGNOSIS — Z7902 Long term (current) use of antithrombotics/antiplatelets: Secondary | ICD-10-CM | POA: Diagnosis not present

## 2021-04-26 DIAGNOSIS — K219 Gastro-esophageal reflux disease without esophagitis: Secondary | ICD-10-CM | POA: Diagnosis present

## 2021-04-26 DIAGNOSIS — R062 Wheezing: Secondary | ICD-10-CM | POA: Diagnosis not present

## 2021-04-26 DIAGNOSIS — R000 Tachycardia, unspecified: Secondary | ICD-10-CM | POA: Diagnosis not present

## 2021-04-26 DIAGNOSIS — E871 Hypo-osmolality and hyponatremia: Secondary | ICD-10-CM | POA: Diagnosis not present

## 2021-04-26 DIAGNOSIS — J449 Chronic obstructive pulmonary disease, unspecified: Secondary | ICD-10-CM | POA: Diagnosis present

## 2021-04-26 DIAGNOSIS — R0902 Hypoxemia: Secondary | ICD-10-CM | POA: Diagnosis not present

## 2021-04-26 DIAGNOSIS — R0602 Shortness of breath: Secondary | ICD-10-CM | POA: Diagnosis not present

## 2021-04-26 DIAGNOSIS — Z833 Family history of diabetes mellitus: Secondary | ICD-10-CM

## 2021-04-26 DIAGNOSIS — Z8673 Personal history of transient ischemic attack (TIA), and cerebral infarction without residual deficits: Secondary | ICD-10-CM | POA: Diagnosis not present

## 2021-04-26 DIAGNOSIS — D6869 Other thrombophilia: Secondary | ICD-10-CM | POA: Diagnosis present

## 2021-04-26 DIAGNOSIS — H9193 Unspecified hearing loss, bilateral: Secondary | ICD-10-CM | POA: Diagnosis present

## 2021-04-26 DIAGNOSIS — U071 COVID-19: Secondary | ICD-10-CM | POA: Diagnosis not present

## 2021-04-26 HISTORY — DX: Chronic obstructive pulmonary disease, unspecified: J44.9

## 2021-04-26 LAB — CBC WITH DIFFERENTIAL/PLATELET
Abs Immature Granulocytes: 0.01 10*3/uL (ref 0.00–0.07)
Basophils Absolute: 0 10*3/uL (ref 0.0–0.1)
Basophils Relative: 0 %
Eosinophils Absolute: 0 10*3/uL (ref 0.0–0.5)
Eosinophils Relative: 0 %
HCT: 35.9 % — ABNORMAL LOW (ref 36.0–46.0)
Hemoglobin: 11.8 g/dL — ABNORMAL LOW (ref 12.0–15.0)
Immature Granulocytes: 0 %
Lymphocytes Relative: 20 %
Lymphs Abs: 1.1 10*3/uL (ref 0.7–4.0)
MCH: 27.8 pg (ref 26.0–34.0)
MCHC: 32.9 g/dL (ref 30.0–36.0)
MCV: 84.5 fL (ref 80.0–100.0)
Monocytes Absolute: 0.4 10*3/uL (ref 0.1–1.0)
Monocytes Relative: 7 %
Neutro Abs: 3.9 10*3/uL (ref 1.7–7.7)
Neutrophils Relative %: 73 %
Platelets: 135 10*3/uL — ABNORMAL LOW (ref 150–400)
RBC: 4.25 MIL/uL (ref 3.87–5.11)
RDW: 12.9 % (ref 11.5–15.5)
WBC: 5.4 10*3/uL (ref 4.0–10.5)
nRBC: 0 % (ref 0.0–0.2)

## 2021-04-26 LAB — COMPREHENSIVE METABOLIC PANEL
ALT: 17 U/L (ref 0–44)
AST: 40 U/L (ref 15–41)
Albumin: 3.2 g/dL — ABNORMAL LOW (ref 3.5–5.0)
Alkaline Phosphatase: 47 U/L (ref 38–126)
Anion gap: 13 (ref 5–15)
BUN: 20 mg/dL (ref 8–23)
CO2: 17 mmol/L — ABNORMAL LOW (ref 22–32)
Calcium: 8.7 mg/dL — ABNORMAL LOW (ref 8.9–10.3)
Chloride: 90 mmol/L — ABNORMAL LOW (ref 98–111)
Creatinine, Ser: 0.87 mg/dL (ref 0.44–1.00)
GFR, Estimated: >60 mL/min (ref >60)
Glucose, Bld: 117 mg/dL — ABNORMAL HIGH (ref 70–99)
Potassium: 3.7 mmol/L (ref 3.5–5.1)
Sodium: 120 mmol/L — ABNORMAL LOW (ref 135–145)
Total Bilirubin: 1.1 mg/dL (ref 0.3–1.2)
Total Protein: 6.5 g/dL (ref 6.5–8.1)

## 2021-04-26 LAB — D-DIMER, QUANTITATIVE: D-Dimer, Quant: 1.54 ug/mL-FEU — ABNORMAL HIGH (ref 0.00–0.50)

## 2021-04-26 LAB — RESP PANEL BY RT-PCR (FLU A&B, COVID) ARPGX2
Influenza A by PCR: NEGATIVE
Influenza B by PCR: NEGATIVE
SARS Coronavirus 2 by RT PCR: POSITIVE — AB

## 2021-04-26 LAB — BRAIN NATRIURETIC PEPTIDE: B Natriuretic Peptide: 148.9 pg/mL — ABNORMAL HIGH (ref 0.0–100.0)

## 2021-04-26 LAB — SODIUM, URINE, RANDOM: Sodium, Ur: 29 mmol/L

## 2021-04-26 LAB — OSMOLALITY, URINE: Osmolality, Ur: 186 mOsm/kg — ABNORMAL LOW (ref 300–900)

## 2021-04-26 LAB — LACTIC ACID, PLASMA: Lactic Acid, Venous: 1.4 mmol/L (ref 0.5–1.9)

## 2021-04-26 MED ORDER — SODIUM CHLORIDE 0.9 % IV SOLN
200.0000 mg | Freq: Once | INTRAVENOUS | Status: AC
  Administered 2021-04-26: 18:00:00 200 mg via INTRAVENOUS
  Filled 2021-04-26 (×2): qty 40

## 2021-04-26 MED ORDER — ALBUTEROL SULFATE HFA 108 (90 BASE) MCG/ACT IN AERS
2.0000 | INHALATION_SPRAY | RESPIRATORY_TRACT | Status: DC | PRN
  Administered 2021-04-28: 13:00:00 2 via RESPIRATORY_TRACT
  Filled 2021-04-26: qty 6.7

## 2021-04-26 MED ORDER — ENOXAPARIN SODIUM 40 MG/0.4ML IJ SOSY
40.0000 mg | PREFILLED_SYRINGE | INTRAMUSCULAR | Status: DC
  Administered 2021-04-26 – 2021-04-27 (×2): 40 mg via SUBCUTANEOUS
  Filled 2021-04-26 (×3): qty 0.4

## 2021-04-26 MED ORDER — IOHEXOL 350 MG/ML SOLN
75.0000 mL | Freq: Once | INTRAVENOUS | Status: AC | PRN
  Administered 2021-04-26: 19:00:00 75 mL via INTRAVENOUS

## 2021-04-26 MED ORDER — PANTOPRAZOLE SODIUM 40 MG PO TBEC
40.0000 mg | DELAYED_RELEASE_TABLET | Freq: Every day | ORAL | Status: DC
  Administered 2021-04-26 – 2021-04-29 (×4): 40 mg via ORAL
  Filled 2021-04-26 (×4): qty 1

## 2021-04-26 MED ORDER — UMECLIDINIUM-VILANTEROL 62.5-25 MCG/ACT IN AEPB
1.0000 | INHALATION_SPRAY | Freq: Every day | RESPIRATORY_TRACT | Status: DC
  Administered 2021-04-26 – 2021-04-28 (×2): 1 via RESPIRATORY_TRACT
  Filled 2021-04-26: qty 14

## 2021-04-26 MED ORDER — SODIUM CHLORIDE 0.9 % IV SOLN: INTRAVENOUS | Status: DC

## 2021-04-26 MED ORDER — ACETAMINOPHEN 325 MG PO TABS: 650.0000 mg | ORAL_TABLET | Freq: Four times a day (QID) | ORAL | Status: DC | PRN

## 2021-04-26 MED ORDER — SODIUM CHLORIDE 0.9 % IV SOLN
100.0000 mg | Freq: Every day | INTRAVENOUS | Status: DC
  Administered 2021-04-27 – 2021-04-29 (×3): 100 mg via INTRAVENOUS
  Filled 2021-04-26 (×2): qty 20
  Filled 2021-04-26: qty 100

## 2021-04-26 MED ORDER — ROPINIROLE HCL 0.5 MG PO TABS
1.0000 mg | ORAL_TABLET | Freq: Every day | ORAL | Status: DC
  Administered 2021-04-26 – 2021-04-28 (×3): 1 mg via ORAL
  Filled 2021-04-26 (×2): qty 1
  Filled 2021-04-26: qty 2

## 2021-04-26 NOTE — Hospital Course (Addendum)
Cassidy Ellison is a 87 yo F presenting with SOB. PMHx COPD, HLD, HTN, DVT (no anticoagulation), and atypical migraine.   Acute Hypoxic Respiratory failure 2/2 COVID-19   COPD Patient brought in by EMS with hypoxia requiring supplemental oxygen. CXR showed prominent bronchial markings and COVID test was positive. 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  Patient was transitioned to room air and tolerated well.  Inflammatory markers down trended throughout stay. Discharge home with home health PT/OT.   Hyponatremia   Hx of SIADH On presentation, the patient was hyponatremic to 120.  We continued with fluid restriction and monitored sodium levels very closely.  She had a gradual increase in sodium, and on discharge had sodium of 127 with a baseline sodium generally in the lower 130s.  Urine sodium, osmolality, serum osmolality were all obtained and pointed to SIADH as the cause of patient's hyponatremia. 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 Will need outpatient follow up of electrolytes.  Acute Encephalopathy 2/2 Delirium  Patient had acute encephalopathy that was likely multifactorial in nature secondary to delirium.  This improved throughout the patient's stay and as she improved clinically.  HTN: Helding blood pressure medications due to episodes of soft diastolic pressures. These were also held on discharge.   Other chronic conditions were treated with home medications and monitored.    Items for Follow Up:  Follow up BMP for hyponatremia with PCP in one week  Monitor acute encephalopathy by assessing mental status  Monitor oxygen status   We are holding all of this patient's BP meds on discharge due to borderline-low readings throughout hospitalization. Please reassess the need for anti-hypertensives.

## 2021-04-26 NOTE — ED Triage Notes (Signed)
Pt tested positive for covid last week. Reports productive cough x 2-3 weeks with sob. EMS reports 81% on RA. EMS gave Albuterol, Atrovent tx. 125mg  Solumedrol, 2g Mag drip for 69minutes. Pt did not complete drip. Pt came in on nonrebreather. O2 94% on RA.

## 2021-04-26 NOTE — Progress Notes (Signed)
FPTS Brief Progress Note  S: Julienne was seen this p.m., awake with her son at bedside.  She is A&Ox 2, to self and year.  Was not able to state that she was in the hospital and admitted for [place].  Per son at bedside, patient becomes altered when her sodium is low.  Also that patient is currently not at baseline.   O: BP 139/70    Pulse 70    Temp 98 F (36.7 C) (Oral)    Resp 19    Ht 5\' 2"  (1.575 m)    Wt 64.4 kg    SpO2 94%    BMI 25.97 kg/m   Physical Exam Vitals and nursing note reviewed.  Constitutional:      General: She is awake. She is not in acute distress.    Appearance: She is not ill-appearing, toxic-appearing or diaphoretic.  HENT:     Head: Normocephalic and atraumatic.  Pulmonary:     Effort: Pulmonary effort is normal. No tachypnea, bradypnea or respiratory distress.     Breath sounds: Decreased air movement present. Decreased breath sounds present. No wheezing, rhonchi or rales.  Neurological:     Mental Status: She is alert. She is disoriented.  Psychiatric:        Behavior: Behavior is cooperative.     A/P: RYLIN SAEZ is a 87 y.o. female with past medical history of COPD, HLD, HTN, DVT (not on anticoagulation), and atypical migraine, who presented with SOB and found to be COVID positive.  COVID day 1   COPD Patient denied SOB, SPO2 95% on room air.  D-dimer elevated at 1.54. Anoro Ellipta daily Remdesivir starting tomorrow Holding Decadron due to steroid induced SIADH and psychosis in the setting of hyponatremia. Trend inflammatory labs, CRP and D-dimer Supplemental O2 PRN, goal SPO2 88-92% Wean O2 as tolerated Incentive spirometer Continuous pulse ox   - Orders reviewed. Labs for AM ordered, which was adjusted as needed.   Remainder of plan per day team/daily progress note.  Merrily Brittle, DO 04/27/2021, 3:10 AM PGY-1, [facility] Night Resident  Please page [phone] with questions.

## 2021-04-26 NOTE — ED Notes (Signed)
MD at bedside. 

## 2021-04-26 NOTE — H&P (Addendum)
[facility] Admission History and Physical Service Pager: [phone]  Patient name: Cassidy Ellison record number: [id number] Date of birth: 01-04-1922 Age: 87 y.o. Gender: female  Primary Care Provider: Harlan Stains, MD Consultants: None Code Status: DNR Preferred Emergency Contact: Daughter Larence Penning (Daughter)  [phone] (Mobile)  Chief Complaint: Shortness of Breath  Assessment and Plan: Cassidy Ellison is a 87 y.o. female presenting with shortness of breath. PMH is significant for COPD, HLD, HTN, DVT (not on anticoagulation), and atypical migraine.   COVID-19 COPD  EMS found pt to be 81% on RA. On arrival to the ED she was weaned to L and vitals were stable. HR in 90s, BP 120s/60s.  Admission labs notable for COVID-positive on respiratory panel, lactic acid within normal limits at 1.4, BNP slightly and 148.9.  Platelets down slightly at 135. Chest x-ray with prominence of the bronchial markings which may represent viral bronchiolitis/pneumonitis, consistent with COVID infection.  Lung exam likewise consistent with COVID infection with coarse breath sounds throughout but no focal crackles or areas of decreased sounds.  Does not appear short of breath throughout examination.  Patient's daughter does acknowledge incredible increase shortness of breath when ambulating. Status post Solu-Medrol, Atrovent, IV mag with EMS. Consider also PE given acute onset of SOB and tachycardia on admission in the setting of hypercoagulable state with COVID-19, especially given history of DVT. Will rule this out with CTA chest.  - Admit to med-tele, Dr. Owens Shark attending -CTA Chest PE Study -We will initiate remdesivir -Hold off on Decadron due to concern for steroid-induced SIADH -Adding CRP and D-dimer to admission labs -Trend daily CBC, CMP, CRP, D-dimer -Continue home Anoro Ellipta -Tylenol as needed for fever -Supplemental O2 as needed -Wean O2 as  tolerated -PT/OT - Once weaned off of O2 ambulate with pulse ox -Incentive spirometer and flutter valve -Continuous pulse ox  Hyponatremia Patient's sodium on admission was 120.  Per the daughter's history she is somewhat confused and appeared more wobbly on her feet compared to baseline.  Patient does have a history of recurrent hyponatremia and there seems to be a temporal relationship between receiving steroids and subsequent hyponatremia.  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  Patient received Solu-Medrol from EMS. - Regular diet with 123mL fluid restriction - Recheck BMP now and midnight - Urine sodium, osmolality ordered - Serum osmolality ordered - If Na drops below 120, will consult nephrology  Hypertension BP 110s-130s/60s-70s.  -Will hold off on home BP meds for now  Restless Legs -Continue home ropinirole   FEN/GI: Regular diet with 500 mL fluid restriction Prophylaxis: Lovenox  Disposition: MedSurg  History of Present Illness:  Cassidy Ellison is a 87 y.o. female presenting with 3 days of cough, congestion, with acute onset shortness of breath today. She called who daughter who then called EMS. She also endorses large volume watery diarrhea last night that was brown in color. No bloody stool. Denies CP or palpitations. Does endorse a mild, intermittent headache for the past few days. Her daughter believes that she is more confused than usual at this time and noted that she was "wobbly" when walking with EMS earlier today. No N/V or changes in urination.  She was initially diagnosed with COVID at her facility on 12/16, the same day that she began developing a productive cough with thin white sputum and nasal congestion.  She has been on Paxlovid for the last 2 days.   She  received albuterol, Atrovent, Solumedrol and IV Mag.   Review Of Systems: Per HPI with the following additions:   Review of Systems  Constitutional:  Negative for chills and  fever.  Respiratory:  Positive for cough and shortness of breath. Negative for chest tightness.   Cardiovascular:  Negative for chest pain, palpitations and leg swelling.  Gastrointestinal:  Positive for diarrhea.  Neurological:  Positive for headaches. Negative for syncope.    Patient Active Problem List   Diagnosis Date Noted   Pre-ulcerative corn or callous 04/09/2021   Age-related osteoporosis without current pathological fracture 06/30/2020   Allergic rhinitis 06/30/2020   Amnesia 06/30/2020   Dyslipidemia 06/30/2020   Hardening of the aorta (main artery of the heart) ([place]) 06/30/2020   Irritable bowel syndrome with diarrhea 06/30/2020   Personal history of transient ischemic attack (TIA), and cerebral infarction without residual deficits 06/30/2020   Postcholecystectomy diarrhea 06/30/2020   Psoriasis 06/30/2020   Recurrent falls 06/30/2020   Thyroid nodule 06/30/2020   Urinary incontinence 06/30/2020   Vitamin D deficiency 06/30/2020   Left ear impacted cerumen 06/17/2020   Otorrhea, right 06/17/2020   Bilateral hearing loss 06/17/2020   Wheezing 02/09/2018   Pulmonary nodule 02/09/2018   Palpitations 08/12/2015   HTN (hypertension) 08/12/2015   Acute confusional state 08/12/2015   Cerebrovascular accident (CVA) ([place])    Hyperlipidemia    TIA (transient ischemic attack) 06/13/2015   Influenza with pneumonia 05/17/2015   Essential hypertension 05/15/2015   GERD (gastroesophageal reflux disease) 05/15/2015   Cough 69/67/8938   Complicated migraine 02/23/5101   RLS (restless legs syndrome)    Hyponatremia    Stridor    Dehydration 05/11/2015   UTI (lower urinary tract infection) 05/11/2015   Acute bronchitis with bronchospasm 05/11/2015    Past Medical History: Past Medical History:  Diagnosis Date   Carpal tunnel syndrome    left   COPD (chronic obstructive pulmonary disease) ([place])    DVT (deep venous thrombosis) ([facility])    GERD (gastroesophageal reflux  disease)    Hyperlipemia    Hypertension    RLS (restless legs syndrome)     Past Surgical History: Past Surgical History:  Procedure Laterality Date   ABDOMINAL HYSTERECTOMY     CARPAL TUNNEL RELEASE Left 10/31/2014   Procedure: LEFT CARPAL TUNNEL RELEASE;  Surgeon: Leanora Cover, MD;  Location: [facility];  Service: Orthopedics;  Laterality: Left;   CARPAL TUNNEL RELEASE Right 02/12/2016   Procedure: CARPAL TUNNEL RELEASE, right;  Surgeon: Leanora Cover, MD;  Location: [facility];  Service: Orthopedics;  Laterality: Right;   GALLBLADDER SURGERY      Social History: Social History   Tobacco Use   Smoking status: Former    Packs/day: 1.50    Years: 30.00    Pack years: 45.00    Types: Cigarettes    Quit date: 05/10/1990    Years since quitting: 30.9   Smokeless tobacco: Never   Tobacco comments:    quit 20years ago  Vaping Use   Vaping Use: Never used  Substance Use Topics   Alcohol use: Yes    Comment: social events   Drug use: No    Family History: Family History  Problem Relation Age of Onset   Heart failure Father    Heart failure Mother    Cancer Sister        breast cancer   Diabetes Brother     Allergies and Medications: Allergies  Allergen Reactions   Corticosteroids  Other (See Comments)    Caused patient to have low sodium levels   Adhesive [Tape] Itching   Chlorthalidone Other (See Comments)    creatinine  increases Other reaction(s): elevated creatinine Other reaction(s): elevated creatinine Other reaction(s): elevated creatinine   Denture Adhesive     Other reaction(s): rash   Hydrochlorothiazide Other (See Comments)    CREATININE INCREASES Other reaction(s): elevated creatinine Other reaction(s): elevated creatinine Other reaction(s): elevated creatinine   Methoxsalen     Other reaction(s): cream--rash Other reaction(s): cream--rash Other reaction(s): cream--rash   Other     Other reaction(s): rash Other  reaction(s): rash   Prednisone     Other reaction(s): hyponatremia Other reaction(s): hyponatremia Other reaction(s): hyponatremia   No current facility-administered medications on file prior to encounter.   Current Outpatient Medications on File Prior to Encounter  Medication Sig Dispense Refill   alendronate (FOSAMAX) 70 MG tablet      amLODipine (NORVASC) 5 MG tablet Take 5 mg by mouth daily.      atorvastatin (LIPITOR) 20 MG tablet Take 20 mg by mouth daily.      betamethasone dipropionate (DIPROLENE) 0.05 % ointment Apply 1 application topically 2 (two) times daily.   2   Calcium Carbonate (CALCIUM 600 PO) Take 600 mg by mouth daily.     Cholecalciferol (VITAMIN D-3) 1000 units CAPS Take 1,000 Units by mouth daily.     clopidogrel (PLAVIX) 75 MG tablet Take 1 tablet (75 mg total) by mouth daily. 90 tablet 3   cyclobenzaprine (FLEXERIL) 5 MG tablet Take 1 tablet (5 mg total) by mouth at bedtime. 30 tablet 1   docusate sodium (COLACE) 100 MG capsule 1 capsule as needed     fexofenadine (ALLEGRA) 180 MG tablet Take 180 mg by mouth daily.     fluticasone (FLONASE) 50 MCG/ACT nasal spray Place 2 sprays into both nostrils daily. 16 g 5   losartan (COZAAR) 100 MG tablet Take 100 mg by mouth daily.     metoprolol succinate (TOPROL-XL) 50 MG 24 hr tablet Take 1 tablet (50 mg total) by mouth daily. 30 tablet 3   Multiple Vitamins-Minerals (MULTIVITAMIN WITH MINERALS) tablet Take 1 tablet by mouth daily.      Omega-3 Fatty Acids (FISH OIL) 1000 MG CAPS Take by mouth.     pantoprazole (PROTONIX) 40 MG tablet      polyethylene glycol powder (GLYCOLAX/MIRALAX) 17 GM/SCOOP powder Take by mouth.     rOPINIRole (REQUIP) 1 MG tablet Take 1 mg by mouth at bedtime.     triamcinolone cream (KENALOG) 0.1 % APPLY TO GROIN AREA ONCE DAILY AS NEEDED FOR ITCH     Umeclidinium-Vilanterol (ANORO ELLIPTA IN) 1 puff     umeclidinium-vilanterol (ANORO ELLIPTA) 62.5-25 MCG/INH AEPB 1 puff     vitamin C (ASCORBIC  ACID) 500 MG tablet Take 500 mg by mouth daily.      Objective: BP 112/67    Pulse 84    Temp 98 F (36.7 C) (Oral)    Resp 16    Ht 5\' 2"  (1.575 m)    Wt 64.4 kg    SpO2 94%    BMI 25.97 kg/m  Exam: General: Very thin elderly female, NAD, comfortable appearing and interactive throughout interview Eyes: No scleral icterus or conjunctival injection, EOMs intact ENTM: Mucous membranes moist Neck: Supple Cardiovascular: RRR, no murmur, rub, gallop Respiratory: Diffuse rhonchi, no foci of crackles or diminished breath sounds Gastrointestinal: Abdomen is soft, non-tender, non-distended MSK: Without edema or  deformity Derm: Warm and dry, no skin tenting Neuro: No focal deficits Psych: Mood and affect are normal  Labs and Imaging: CBC BMET  Recent Labs  Lab 04/26/21 1302  WBC 5.4  HGB 11.8*  HCT 35.9*  PLT 135*   Recent Labs  Lab 04/26/21 1302  NA 120*  K 3.7  CL 90*  CO2 17*  BUN 20  CREATININE 0.87  GLUCOSE 117*  CALCIUM 8.7*     EKG: Sinus tachycardia, no ST elevation or depression, QTC 465  DG Chest 2 View CLINICAL DATA:  Shortness of breath, COVID positive.  EXAM: CHEST - 2 VIEW  COMPARISON:  None.  FINDINGS: The heart size and mediastinal contours are within normal limits. No focal consolidation or pleural effusion. Prominence of the bronchovascular markings, which may represent viral bronchiolitis. The visualized skeletal structures are unremarkable.  IMPRESSION: No focal consolidation or large pleural effusion. Prominence of the bronchial markings which may represent viral bronchiolitis/pneumonitis.  Electronically Signed   By: Keane Police D.O.   On: 04/26/2021 14:13    Eppie Gibson, MD 04/26/2021, 4:39 PM PGY-1, [place] pager: [phone], text pages welcome  FPTS Upper-Level Resident Addendum   I have independently interviewed and examined the patient. I have discussed the above with the original author  and agree with their documentation.Please see also any attending notes.   Gifford Shave, MD PGY-3, [facility] 04/26/2021 6:51 PM  FPTS Service pager: [phone] (text pages welcome through [facility])

## 2021-04-26 NOTE — ED Provider Notes (Signed)
See [place] Provider Note   CSN: [id number] Arrival date & time: 04/26/21  1231     History Chief Complaint  Patient presents with   Shortness of Breath         Cassidy Ellison is a 87 y.o. female.  HPI Patient is a 87 year old female with past medical history significant for COPD, reflux, HLD, HTN  Patient is presented to the emergency department today with complaints of cough congestion since 3 days ago seems that today she became acutely short of breath and was found to be wheezing by EMS and hypoxic 81% on room air.  She was given albuterol, Atrovent, 125 Solu-Medrol 1 g of magnesium and seems to have dramatic improvement in her symptoms.  She states that she feels well currently but still somewhat short of breath.  Denies any nausea vomiting diarrhea chest pain lightheadedness or dizziness.  No syncope or near syncope.  She states that her symptoms came on gradually over the course of the morning.  Denies any other associate symptoms.  She denies any hemoptysis or unilateral leg swelling.     Past Medical History:  Diagnosis Date   Carpal tunnel syndrome    left   COPD (chronic obstructive pulmonary disease) (HCC)    DVT (deep venous thrombosis) (HCC)    GERD (gastroesophageal reflux disease)    Hyperlipemia    Hypertension    RLS (restless legs syndrome)     Patient Active Problem List   Diagnosis Date Noted   Pre-ulcerative corn or callous 04/09/2021   Age-related osteoporosis without current pathological fracture 06/30/2020   Allergic rhinitis 06/30/2020   Amnesia 06/30/2020   Dyslipidemia 06/30/2020   Hardening of the aorta (main artery of the heart) ([place]) 06/30/2020   Irritable bowel syndrome with diarrhea 06/30/2020   Personal history of transient ischemic attack (TIA), and cerebral infarction without residual deficits 06/30/2020   Postcholecystectomy diarrhea 06/30/2020   Psoriasis 06/30/2020   Recurrent falls  06/30/2020   Thyroid nodule 06/30/2020   Urinary incontinence 06/30/2020   Vitamin D deficiency 06/30/2020   Left ear impacted cerumen 06/17/2020   Otorrhea, right 06/17/2020   Bilateral hearing loss 06/17/2020   Wheezing 02/09/2018   Pulmonary nodule 02/09/2018   Palpitations 08/12/2015   HTN (hypertension) 08/12/2015   Acute confusional state 08/12/2015   Cerebrovascular accident (CVA) ([place])    Hyperlipidemia    TIA (transient ischemic attack) 06/13/2015   Influenza with pneumonia 05/17/2015   Essential hypertension 05/15/2015   GERD (gastroesophageal reflux disease) 05/15/2015   Cough 58/52/7782   Complicated migraine 42/35/3614   RLS (restless legs syndrome)    Hyponatremia    Stridor    Dehydration 05/11/2015   UTI (lower urinary tract infection) 05/11/2015   Acute bronchitis with bronchospasm 05/11/2015    Past Surgical History:  Procedure Laterality Date   ABDOMINAL HYSTERECTOMY     CARPAL TUNNEL RELEASE Left 10/31/2014   Procedure: LEFT CARPAL TUNNEL RELEASE;  Surgeon: Leanora Cover, MD;  Location: [facility];  Service: Orthopedics;  Laterality: Left;   CARPAL TUNNEL RELEASE Right 02/12/2016   Procedure: CARPAL TUNNEL RELEASE, right;  Surgeon: Leanora Cover, MD;  Location: [facility];  Service: Orthopedics;  Laterality: Right;   GALLBLADDER SURGERY       OB History   No obstetric history on file.     Family History  Problem Relation Age of Onset   Heart failure Father    Heart failure Mother  Cancer Sister        breast cancer   Diabetes Brother     Social History   Tobacco Use   Smoking status: Former    Packs/day: 1.50    Years: 30.00    Pack years: 45.00    Types: Cigarettes    Quit date: 05/10/1990    Years since quitting: 30.9   Smokeless tobacco: Never   Tobacco comments:    quit 20years ago  Vaping Use   Vaping Use: Never used  Substance Use Topics   Alcohol use: Yes    Comment: social events   Drug use: No     Home Medications Prior to Admission medications   Medication Sig Start Date End Date Taking? Authorizing Provider  alendronate (FOSAMAX) 70 MG tablet  12/13/18   [provider]  amLODipine (NORVASC) 5 MG tablet Take 5 mg by mouth daily.  08/19/17   [provider]  atorvastatin (LIPITOR) 20 MG tablet Take 20 mg by mouth daily.  07/28/17   [provider]  betamethasone dipropionate (DIPROLENE) 0.05 % ointment Apply 1 application topically 2 (two) times daily.  11/21/17   [provider]  Calcium Carbonate (CALCIUM 600 PO) Take 600 mg by mouth daily.    [provider]  Cholecalciferol (VITAMIN D-3) 1000 units CAPS Take 1,000 Units by mouth daily.    [provider]  clopidogrel (PLAVIX) 75 MG tablet Take 1 tablet (75 mg total) by mouth daily. 09/05/17   Rosalin Hawking, MD  cyclobenzaprine (FLEXERIL) 5 MG tablet Take 1 tablet (5 mg total) by mouth at bedtime. 04/20/21   Edrick Kins, DPM  docusate sodium (COLACE) 100 MG capsule 1 capsule as needed    [provider]  fexofenadine (ALLEGRA) 180 MG tablet Take 180 mg by mouth daily.    [provider]  fluticasone (FLONASE) 50 MCG/ACT nasal spray Place 2 sprays into both nostrils daily. 04/05/18   Collene Gobble, MD  losartan (COZAAR) 100 MG tablet Take 100 mg by mouth daily.    [provider]  metoprolol succinate (TOPROL-XL) 50 MG 24 hr tablet Take 1 tablet (50 mg total) by mouth daily. 08/12/15   Rosalin Hawking, MD  Multiple Vitamins-Minerals (MULTIVITAMIN WITH MINERALS) tablet Take 1 tablet by mouth daily.     [provider]  Omega-3 Fatty Acids (FISH OIL) 1000 MG CAPS Take by mouth.    [provider]  pantoprazole (PROTONIX) 40 MG tablet  11/17/18   [provider]  polyethylene glycol powder (GLYCOLAX/MIRALAX) 17 GM/SCOOP powder Take by mouth.    [provider]  rOPINIRole (REQUIP) 1 MG tablet Take 1 mg by mouth at bedtime.     [provider]  triamcinolone cream (KENALOG) 0.1 % APPLY TO GROIN AREA ONCE DAILY AS NEEDED FOR [facility] 03/06/19   [provider]  Umeclidinium-Vilanterol (ANORO ELLIPTA IN) 1 puff 07/22/20   [provider]  umeclidinium-vilanterol (ANORO ELLIPTA) 62.5-25 MCG/INH AEPB 1 puff 07/22/20   [provider]  vitamin C (ASCORBIC ACID) 500 MG tablet Take 500 mg by mouth daily.    [provider]    Allergies    Corticosteroids, Adhesive [tape], Chlorthalidone, Denture adhesive, Hydrochlorothiazide, Methoxsalen, Other, and Prednisone  Review of Systems   Review of Systems  Constitutional:  Positive for fatigue. Negative for chills and fever.  HENT:  Negative for congestion.   Eyes:  Negative for pain.  Respiratory:  Positive for cough and shortness of breath.  Cardiovascular:  Negative for chest pain and leg swelling.  Gastrointestinal:  Negative for abdominal pain, diarrhea, nausea and vomiting.  Genitourinary:  Negative for dysuria.  Musculoskeletal:  Negative for myalgias.  Skin:  Negative for rash.  Neurological:  Negative for dizziness and headaches.   Physical Exam Updated Vital Signs BP 128/73    Pulse 93    Temp 98 F (36.7 C) (Oral)    Resp 15    Ht 5\' 2"  (1.575 m)    Wt 64.4 kg    SpO2 95%    BMI 25.97 kg/m   Physical Exam Vitals and nursing note reviewed.  Constitutional:      General: She is not in acute distress. HENT:     Head: Normocephalic and atraumatic.     Nose: Nose normal.  Eyes:     General: No scleral icterus. Cardiovascular:     Rate and Rhythm: Normal rate and regular rhythm.     Pulses: Normal pulses.     Heart sounds: Normal heart sounds.  Pulmonary:     Breath sounds: Rhonchi present. No wheezing.     Comments: Diffuse rhonchi.  Tachypneic SPO2 drops to 87% on RA with talking.  Placed on 2 L nasal cannula with mid to high 90s SPO2 Abdominal:     Palpations: Abdomen is soft.     Tenderness: There is no  abdominal tenderness.  Musculoskeletal:     Cervical back: Normal range of motion.     Right lower leg: No edema.     Left lower leg: No edema.  Skin:    General: Skin is warm and dry.     Capillary Refill: Capillary refill takes less than 2 seconds.  Neurological:     Mental Status: She is alert. Mental status is at baseline.  Psychiatric:        Mood and Affect: Mood normal.        Behavior: Behavior normal.    ED Results / Procedures / Treatments   Labs (all labs ordered are listed, but only abnormal results are displayed) Labs Reviewed  RESP PANEL BY RT-PCR (FLU A&B, COVID) ARPGX2 - Abnormal; Notable for the following components:      Result Value   SARS Coronavirus 2 by RT PCR POSITIVE (*)    All other components within normal limits  CBC WITH DIFFERENTIAL/PLATELET - Abnormal; Notable for the following components:   Hemoglobin 11.8 (*)    HCT 35.9 (*)    Platelets 135 (*)    All other components within normal limits  COMPREHENSIVE METABOLIC PANEL - Abnormal; Notable for the following components:   Sodium 120 (*)    Chloride 90 (*)    CO2 17 (*)    Glucose, Bld 117 (*)    Calcium 8.7 (*)    Albumin 3.2 (*)    All other components within normal limits  CULTURE, BLOOD (SINGLE)  LACTIC ACID, PLASMA  BRAIN NATRIURETIC PEPTIDE    EKG None  Radiology DG Chest 2 View  Result Date: 04/26/2021 CLINICAL DATA:  Shortness of breath, COVID positive. EXAM: CHEST - 2 VIEW COMPARISON:  None. FINDINGS: The heart size and mediastinal contours are within normal limits. No focal consolidation or pleural effusion. Prominence of the bronchovascular markings, which may represent viral bronchiolitis. The visualized skeletal structures are unremarkable. IMPRESSION: No focal consolidation or large pleural effusion. Prominence of the bronchial markings which may represent viral bronchiolitis/pneumonitis. Electronically Signed   By: Keane Police D.O.   On: 04/26/2021  14:13     Procedures .Critical Care Performed by: Tedd Sias, PA Authorized by: Tedd Sias, PA   Critical care provider statement:    Critical care time (minutes):  35   Critical care time was exclusive of:  Separately billable procedures and treating other patients and teaching time   Critical care was necessary to treat or prevent imminent or life-threatening deterioration of the following conditions:  Respiratory failure   Critical care was time spent personally by me on the following activities:  Development of treatment plan with patient or surrogate, review of old charts, re-evaluation of patient's condition, pulse oximetry, ordering and review of radiographic studies, ordering and review of laboratory studies, ordering and performing treatments and interventions, obtaining history from patient or surrogate, examination of patient and evaluation of patient's response to treatment   Care discussed with: admitting provider     Medications Ordered in ED Medications - No data to display  ED Course  I have reviewed the triage vital signs and the nursing notes.  Pertinent labs & imaging results that were available during my care of the patient were reviewed by me and considered in my medical decision making (see chart for details).  Clinical Course as of 04/26/21 1528  Sun Apr 26, 2021  1524 SARS Coronavirus 2 by RT PCR(!): POSITIVE [WF]    Clinical Course User Index [WF] Tedd Sias, [place]   MDM Rules/Calculators/A&P                          Patient is a 87 year old female with history of COPD(?)  Presented today with COVID-19 symptoms since 3 days ago.  Today became acutely short of breath received Solu-Medrol 125, mag 1 g, DuoNeb's with improvement of symptoms.  On my examination she is no longer wheezing but does have diffuse rhonchi.  She is hypoxic and dropped to 87% on room air when talking.  She has a history of oxygen requirement  After arrival EMS was informed  that she has a tendency to develop SIADH and become hyponatremic with steroids.  Sodium of 120.  CBC without leukocytosis or anemia of note.  BNP marginally elevated.  COVID PCR positive.  Blood culture x1  Afebrile, without chest x-ray evidence of pneumonia.  Suspect patient's hypoxia is due to COVID-19.  IMPRESSION:  No focal consolidation or large pleural effusion. Prominence of the  bronchial markings which may represent viral  bronchiolitis/pneumonitis.    I personally reviewed all labs and imaging for this patient.  Agree with radiology read.  EKG nonischemic.  Lactic within normal limits  Patient will require admission for COVID-19 hypoxia.  Patient is agreeable to plan.  Remdesivir ordered per pharmacy consult.  Cassidy Ellison was evaluated in Emergency Department on 04/26/2021 for the symptoms described in the history of present illness. She was evaluated in the context of the global COVID-19 pandemic, which necessitated consideration that the patient might be at risk for infection with the SARS-CoV-2 virus that causes COVID-19. Institutional protocols and algorithms that pertain to the evaluation of patients at risk for COVID-19 are in a state of rapid change based on information released by regulatory bodies including the CDC and federal and state organizations. These policies and algorithms were followed during the patient's care in the ED.   Final Clinical Impression(s) / ED Diagnoses Final diagnoses:  COVID-19  Hyponatremia    Rx / DC Orders ED Discharge Orders  None        Pati Gallo [place], [place] 04/26/21 1604    Pattricia Boss, MD 04/27/21 [phone]

## 2021-04-26 NOTE — ED Provider Notes (Signed)
Received signout at the end of shift, please see notes from previous provider.  In short this is a 87 year old female presenting with COVID symptoms for the past 2 days.  She tested positive for COVID.  She was hypoxic with an O2 sats of 81% on room air at home, improved to 96% on 2 L of supplemental oxygen.  She is receiving remdesivir.  Apparently she developed ARDS after receiving steroid previously so steroid was not initiated.  Appreciate consultation from family medicine resident who agrees to see and will admit patient for further care.  BP 112/67    Pulse 84    Temp 98 F (36.7 C) (Oral)    Resp 16    Ht 5\' 2"  (1.575 m)    Wt 64.4 kg    SpO2 94%    BMI 25.97 kg/m   Results for orders placed or performed during the hospital encounter of 04/26/21  Resp Panel by RT-PCR (Flu A&B, Covid) Nasopharyngeal Swab   Specimen: Nasopharyngeal Swab; Nasopharyngeal(NP) swabs in vial transport medium  Result Value Ref Range   SARS Coronavirus 2 by RT PCR POSITIVE (A) NEGATIVE   Influenza A by PCR NEGATIVE NEGATIVE   Influenza B by PCR NEGATIVE NEGATIVE  Lactic acid, plasma  Result Value Ref Range   Lactic Acid, Venous 1.4 0.5 - 1.9 mmol/L  CBC WITH DIFFERENTIAL  Result Value Ref Range   WBC 5.4 4.0 - 10.5 K/uL   RBC 4.25 3.87 - 5.11 MIL/uL   Hemoglobin 11.8 (L) 12.0 - 15.0 g/dL   HCT 35.9 (L) 36.0 - 46.0 %   MCV 84.5 80.0 - 100.0 fL   MCH 27.8 26.0 - 34.0 pg   MCHC 32.9 30.0 - 36.0 g/dL   RDW 12.9 11.5 - 15.5 %   Platelets 135 (L) 150 - 400 K/uL   nRBC 0.0 0.0 - 0.2 %   Neutrophils Relative % 73 %   Neutro Abs 3.9 1.7 - 7.7 K/uL   Lymphocytes Relative 20 %   Lymphs Abs 1.1 0.7 - 4.0 K/uL   Monocytes Relative 7 %   Monocytes Absolute 0.4 0.1 - 1.0 K/uL   Eosinophils Relative 0 %   Eosinophils Absolute 0.0 0.0 - 0.5 K/uL   Basophils Relative 0 %   Basophils Absolute 0.0 0.0 - 0.1 K/uL   Immature Granulocytes 0 %   Abs Immature Granulocytes 0.01 0.00 - 0.07 K/uL  Comprehensive metabolic  panel  Result Value Ref Range   Sodium 120 (L) 135 - 145 mmol/L   Potassium 3.7 3.5 - 5.1 mmol/L   Chloride 90 (L) 98 - 111 mmol/L   CO2 17 (L) 22 - 32 mmol/L   Glucose, Bld 117 (H) 70 - 99 mg/dL   BUN 20 8 - 23 mg/dL   Creatinine, Ser 0.87 0.44 - 1.00 mg/dL   Calcium 8.7 (L) 8.9 - 10.3 mg/dL   Total Protein 6.5 6.5 - 8.1 g/dL   Albumin 3.2 (L) 3.5 - 5.0 g/dL   AST 40 15 - 41 U/L   ALT 17 0 - 44 U/L   Alkaline Phosphatase 47 38 - 126 U/L   Total Bilirubin 1.1 0.3 - 1.2 mg/dL   GFR, Estimated >60 >60 mL/min   Anion gap 13 5 - 15  Brain natriuretic peptide  Result Value Ref Range   B Natriuretic Peptide 148.9 (H) 0.0 - 100.0 pg/mL   DG Chest 2 View  Result Date: 04/26/2021 CLINICAL DATA:  Shortness of breath, COVID positive.  EXAM: CHEST - 2 VIEW COMPARISON:  None. FINDINGS: The heart size and mediastinal contours are within normal limits. No focal consolidation or pleural effusion. Prominence of the bronchovascular markings, which may represent viral bronchiolitis. The visualized skeletal structures are unremarkable. IMPRESSION: No focal consolidation or large pleural effusion. Prominence of the bronchial markings which may represent viral bronchiolitis/pneumonitis. Electronically Signed   By: Keane Police D.O.   On: 04/26/2021 14:13      Domenic Moras, PA-C 04/26/21 1641    Lajean Saver, MD 04/26/21 [phone]

## 2021-04-26 NOTE — ED Notes (Signed)
Pt O2 turned off. 88% lowest on RA. Pt became tachypneic in the 30s with highest 36cpm. O2 at 2LPM initiated at this time. Provider notified.

## 2021-04-27 ENCOUNTER — Encounter (HOSPITAL_COMMUNITY): Payer: Self-pay | Admitting: Student

## 2021-04-27 DIAGNOSIS — H9193 Unspecified hearing loss, bilateral: Secondary | ICD-10-CM | POA: Diagnosis present

## 2021-04-27 DIAGNOSIS — E222 Syndrome of inappropriate secretion of antidiuretic hormone: Secondary | ICD-10-CM | POA: Diagnosis present

## 2021-04-27 DIAGNOSIS — F05 Delirium due to known physiological condition: Secondary | ICD-10-CM | POA: Diagnosis present

## 2021-04-27 DIAGNOSIS — Z87891 Personal history of nicotine dependence: Secondary | ICD-10-CM | POA: Diagnosis not present

## 2021-04-27 DIAGNOSIS — D6869 Other thrombophilia: Secondary | ICD-10-CM | POA: Diagnosis present

## 2021-04-27 DIAGNOSIS — U071 COVID-19: Secondary | ICD-10-CM | POA: Diagnosis present

## 2021-04-27 DIAGNOSIS — Z8249 Family history of ischemic heart disease and other diseases of the circulatory system: Secondary | ICD-10-CM | POA: Diagnosis not present

## 2021-04-27 DIAGNOSIS — G9341 Metabolic encephalopathy: Secondary | ICD-10-CM | POA: Diagnosis present

## 2021-04-27 DIAGNOSIS — J9601 Acute respiratory failure with hypoxia: Secondary | ICD-10-CM | POA: Diagnosis present

## 2021-04-27 DIAGNOSIS — D696 Thrombocytopenia, unspecified: Secondary | ICD-10-CM | POA: Diagnosis present

## 2021-04-27 DIAGNOSIS — Z833 Family history of diabetes mellitus: Secondary | ICD-10-CM | POA: Diagnosis not present

## 2021-04-27 DIAGNOSIS — J1282 Pneumonia due to coronavirus disease 2019: Secondary | ICD-10-CM | POA: Diagnosis present

## 2021-04-27 DIAGNOSIS — I10 Essential (primary) hypertension: Secondary | ICD-10-CM | POA: Diagnosis present

## 2021-04-27 DIAGNOSIS — Z66 Do not resuscitate: Secondary | ICD-10-CM | POA: Diagnosis present

## 2021-04-27 DIAGNOSIS — Z8673 Personal history of transient ischemic attack (TIA), and cerebral infarction without residual deficits: Secondary | ICD-10-CM | POA: Diagnosis not present

## 2021-04-27 DIAGNOSIS — J9811 Atelectasis: Secondary | ICD-10-CM | POA: Diagnosis present

## 2021-04-27 DIAGNOSIS — Z7902 Long term (current) use of antithrombotics/antiplatelets: Secondary | ICD-10-CM | POA: Diagnosis not present

## 2021-04-27 DIAGNOSIS — Z91048 Other nonmedicinal substance allergy status: Secondary | ICD-10-CM | POA: Diagnosis not present

## 2021-04-27 DIAGNOSIS — J449 Chronic obstructive pulmonary disease, unspecified: Secondary | ICD-10-CM | POA: Diagnosis present

## 2021-04-27 DIAGNOSIS — E871 Hypo-osmolality and hyponatremia: Secondary | ICD-10-CM | POA: Diagnosis present

## 2021-04-27 DIAGNOSIS — Z888 Allergy status to other drugs, medicaments and biological substances status: Secondary | ICD-10-CM | POA: Diagnosis not present

## 2021-04-27 DIAGNOSIS — E785 Hyperlipidemia, unspecified: Secondary | ICD-10-CM | POA: Diagnosis present

## 2021-04-27 DIAGNOSIS — Z79899 Other long term (current) drug therapy: Secondary | ICD-10-CM | POA: Diagnosis not present

## 2021-04-27 DIAGNOSIS — G2581 Restless legs syndrome: Secondary | ICD-10-CM | POA: Diagnosis present

## 2021-04-27 DIAGNOSIS — K219 Gastro-esophageal reflux disease without esophagitis: Secondary | ICD-10-CM | POA: Diagnosis present

## 2021-04-27 LAB — COMPREHENSIVE METABOLIC PANEL
ALT: 19 U/L (ref 0–44)
ALT: 22 U/L (ref 0–44)
ALT: 18 U/L (ref 0–44)
ALT: 23 U/L (ref 0–44)
ALT: 22 U/L (ref 0–44)
AST: 30 U/L (ref 15–41)
AST: 31 U/L (ref 15–41)
AST: 27 U/L (ref 15–41)
AST: 40 U/L (ref 15–41)
AST: 33 U/L (ref 15–41)
Albumin: 3 g/dL — ABNORMAL LOW (ref 3.5–5.0)
Albumin: 3.1 g/dL — ABNORMAL LOW (ref 3.5–5.0)
Albumin: 2.7 g/dL — ABNORMAL LOW (ref 3.5–5.0)
Albumin: 3.3 g/dL — ABNORMAL LOW (ref 3.5–5.0)
Albumin: 3.2 g/dL — ABNORMAL LOW (ref 3.5–5.0)
Alkaline Phosphatase: 51 U/L (ref 38–126)
Alkaline Phosphatase: 41 U/L (ref 38–126)
Alkaline Phosphatase: 34 U/L — ABNORMAL LOW (ref 38–126)
Alkaline Phosphatase: 45 U/L (ref 38–126)
Alkaline Phosphatase: 48 U/L (ref 38–126)
Anion gap: 8 (ref 5–15)
Anion gap: 11 (ref 5–15)
Anion gap: 8 (ref 5–15)
Anion gap: 8 (ref 5–15)
Anion gap: 7 (ref 5–15)
BUN: 17 mg/dL (ref 8–23)
BUN: 17 mg/dL (ref 8–23)
BUN: 16 mg/dL (ref 8–23)
BUN: 18 mg/dL (ref 8–23)
BUN: 20 mg/dL (ref 8–23)
CO2: 23 mmol/L (ref 22–32)
CO2: 22 mmol/L (ref 22–32)
CO2: 22 mmol/L (ref 22–32)
CO2: 23 mmol/L (ref 22–32)
CO2: 23 mmol/L (ref 22–32)
Calcium: 8.6 mg/dL — ABNORMAL LOW (ref 8.9–10.3)
Calcium: 8.8 mg/dL — ABNORMAL LOW (ref 8.9–10.3)
Calcium: 7.7 mg/dL — ABNORMAL LOW (ref 8.9–10.3)
Calcium: 8.8 mg/dL — ABNORMAL LOW (ref 8.9–10.3)
Calcium: 8.7 mg/dL — ABNORMAL LOW (ref 8.9–10.3)
Chloride: 96 mmol/L — ABNORMAL LOW (ref 98–111)
Chloride: 95 mmol/L — ABNORMAL LOW (ref 98–111)
Chloride: 101 mmol/L (ref 98–111)
Chloride: 98 mmol/L (ref 98–111)
Chloride: 97 mmol/L — ABNORMAL LOW (ref 98–111)
Creatinine, Ser: 0.78 mg/dL (ref 0.44–1.00)
Creatinine, Ser: 0.92 mg/dL (ref 0.44–1.00)
Creatinine, Ser: 0.76 mg/dL (ref 0.44–1.00)
Creatinine, Ser: 1.01 mg/dL — ABNORMAL HIGH (ref 0.44–1.00)
Creatinine, Ser: 0.84 mg/dL (ref 0.44–1.00)
GFR, Estimated: >60 mL/min (ref >60)
GFR, Estimated: 57 mL/min — ABNORMAL LOW (ref >60)
GFR, Estimated: >60 mL/min (ref >60)
GFR, Estimated: 51 mL/min — ABNORMAL LOW (ref >60)
GFR, Estimated: >60 mL/min (ref >60)
Glucose, Bld: 134 mg/dL — ABNORMAL HIGH (ref 70–99)
Glucose, Bld: 199 mg/dL — ABNORMAL HIGH (ref 70–99)
Glucose, Bld: 177 mg/dL — ABNORMAL HIGH (ref 70–99)
Glucose, Bld: 156 mg/dL — ABNORMAL HIGH (ref 70–99)
Glucose, Bld: 169 mg/dL — ABNORMAL HIGH (ref 70–99)
Potassium: 4 mmol/L (ref 3.5–5.1)
Potassium: 3.6 mmol/L (ref 3.5–5.1)
Potassium: 3.4 mmol/L — ABNORMAL LOW (ref 3.5–5.1)
Potassium: 3.6 mmol/L (ref 3.5–5.1)
Potassium: 3.7 mmol/L (ref 3.5–5.1)
Sodium: 127 mmol/L — ABNORMAL LOW (ref 135–145)
Sodium: 128 mmol/L — ABNORMAL LOW (ref 135–145)
Sodium: 131 mmol/L — ABNORMAL LOW (ref 135–145)
Sodium: 129 mmol/L — ABNORMAL LOW (ref 135–145)
Sodium: 127 mmol/L — ABNORMAL LOW (ref 135–145)
Total Bilirubin: 0.8 mg/dL (ref 0.3–1.2)
Total Bilirubin: 0.4 mg/dL (ref 0.3–1.2)
Total Bilirubin: 0.5 mg/dL (ref 0.3–1.2)
Total Bilirubin: 0.4 mg/dL (ref 0.3–1.2)
Total Bilirubin: 0.5 mg/dL (ref 0.3–1.2)
Total Protein: 6.8 g/dL (ref 6.5–8.1)
Total Protein: 6.6 g/dL (ref 6.5–8.1)
Total Protein: 5.7 g/dL — ABNORMAL LOW (ref 6.5–8.1)
Total Protein: 6.9 g/dL (ref 6.5–8.1)
Total Protein: 6.7 g/dL (ref 6.5–8.1)

## 2021-04-27 LAB — CBC WITH DIFFERENTIAL/PLATELET
Abs Immature Granulocytes: 0.01 10*3/uL (ref 0.00–0.07)
Basophils Absolute: 0 10*3/uL (ref 0.0–0.1)
Basophils Relative: 0 %
Eosinophils Absolute: 0 10*3/uL (ref 0.0–0.5)
Eosinophils Relative: 0 %
HCT: 36.6 % (ref 36.0–46.0)
Hemoglobin: 12.5 g/dL (ref 12.0–15.0)
Immature Granulocytes: 0 %
Lymphocytes Relative: 15 %
Lymphs Abs: 0.8 10*3/uL (ref 0.7–4.0)
MCH: 28.5 pg (ref 26.0–34.0)
MCHC: 34.2 g/dL (ref 30.0–36.0)
MCV: 83.6 fL (ref 80.0–100.0)
Monocytes Absolute: 0.1 10*3/uL (ref 0.1–1.0)
Monocytes Relative: 3 %
Neutro Abs: 4.2 10*3/uL (ref 1.7–7.7)
Neutrophils Relative %: 82 %
Platelets: 165 10*3/uL (ref 150–400)
RBC: 4.38 MIL/uL (ref 3.87–5.11)
RDW: 12.8 % (ref 11.5–15.5)
WBC: 5.1 10*3/uL (ref 4.0–10.5)
nRBC: 0 % (ref 0.0–0.2)

## 2021-04-27 LAB — PROCALCITONIN: Procalcitonin: 0.1 ng/mL

## 2021-04-27 LAB — C-REACTIVE PROTEIN: CRP: 5.7 mg/dL — ABNORMAL HIGH (ref <1.0)

## 2021-04-27 LAB — OSMOLALITY: Osmolality: 270 mOsm/kg — ABNORMAL LOW (ref 275–295)

## 2021-04-27 LAB — D-DIMER, QUANTITATIVE: D-Dimer, Quant: 1.18 ug/mL-FEU — ABNORMAL HIGH (ref 0.00–0.50)

## 2021-04-27 MED ORDER — DEXTROMETHORPHAN POLISTIREX ER 30 MG/5ML PO SUER
15.0000 mg | Freq: Once | ORAL | Status: AC
  Administered 2021-04-28: 15 mg via ORAL
  Filled 2021-04-27 (×2): qty 5

## 2021-04-27 NOTE — Progress Notes (Signed)
Family Medicine Teaching Service Daily Progress Note Intern Pager: [phone]  Patient name: Cassidy Ellison record number: [id number] Date of birth: 02-02-1922 Age: 87 y.o. Gender: female  Primary Care Provider: Harlan Stains, MD Consultants: None  Code Status: DNR   Pt Overview and Major Events to Date:  12/1: Admitted to FPTS    Assessment and Plan:  Cassidy Ellison is a 87 yo F presenting with SOB. PMHx COPD, HLD, HTN, DVT (no anticoagulation), and atypical migraine.   COVID-19   COPD  CTAP was negative for PE, mild to moderate bibasilar atelectasis and mucous plugs with multiple bilateral atelectasis and/or infiltrate. CBC: Hgb up to 12.5/WBC 5.1, CMP: Na 120>127, CRP 5.7, d-dimer 1.54>1.18. History of steroid-induced SIADH, holding decadron. Pt is satting well on 2L O2.  - Remdesivir day 2/5 - Trending daily CBC, CMP, CRP, D-dimer - Continue home Anoro Ellipta - Once weaned off of oxygen, will ambulate with pulse ox -- PT/OT following - Incentive spirometer and flutter valve  Hyponatremic 2/2 to SIADH Patient has history of steroid induced SIADH, and this is consistent with current lab findings.  Cause likely is multifactorial, received solu-medrol in EMS prior to arrival, other causes include malignancy, PNA/pulmonary disease, other medications. Sodium today 120 > 127 > 128.  7 PM and 12 AM labs overnight were cancelled by lab, see documented note from this morning. Goal increase by maximum of 10 in 24 hours (24 hour mark at 1400 today. New CMP pending for 2PM, will very closely follow.  - Holding steroids -- q4hr CMPs  -- Fluid restriction 1200   Hypertension Blood pressure range: 110s-140s/60s-80s - Holding home blood pressure medications  RLS  -- Continue home Ropinirole home dose   GERD -- Continue home protonix 40 mg.   FEN/GI: Regular diet with 1200 mL fluid restriction  PPx: Lovenox  Dispo:Pending PT recommendations   1-2 days . Barriers include hyponatremia  and oxygen requirement.   Subjective:  Pt is doing quite well today. Denies any worsening SOB or CP. No complaints at this time.   Objective: Temp:  [98 F (36.7 C)] 98 F (36.7 C) (12/18 1238) Pulse Rate:  [65-101] 80 (12/19 0945) Resp:  [12-24] 18 (12/19 0945) BP: (110-145)/(60-94) 125/71 (12/19 0945) SpO2:  [91 %-98 %] 94 % (12/19 0945) Weight:  [64.4 kg] 64.4 kg (12/18 1239) Physical Exam: General: NAD, sitting up in bed with daughter at bedside, [place] in place @2L   Cardiovascular: RRR, no m/g/r Respiratory: No focal diminishments appreciated, diffuse rhonchi  Abdomen: Nontender  Extremities: FROM, no swelling  Neuro: A&Ox3. CN II through XII intact. BUE and BLE 5/5 strength and normal sensation bilaterally, no focal neurologic deficits. PERRLA, EOMI  Laboratory: Recent Labs  Lab 04/26/21 1302 04/27/21 0524  WBC 5.4 5.1  HGB 11.8* 12.5  HCT 35.9* 36.6  PLT 135* 165   Recent Labs  Lab 04/26/21 1302 04/27/21 0524 04/27/21 0938  NA 120* 127* 128*  K 3.7 4.0 3.6  CL 90* 96* 95*  CO2 17* 23 22  BUN 20 17 17   CREATININE 0.87 0.78 0.92  CALCIUM 8.7* 8.6* 8.8*  PROT 6.5 6.8 6.6  BILITOT 1.1 0.8 0.4  ALKPHOS 47 51 41  ALT 17 19 22   AST 40 30 31  GLUCOSE 117* 134* 199*   CTA PE IMPRESSION: 1. No CT evidence of pulmonary embolism. 2. Mild to moderate severity bibasilar atelectasis and/or infiltrate. 3. Mucous plugs involving the multiple bilateral lower lobe secondary and tertiary bronchi.  4. Aortic atherosclerosis.   Erskine Emery, MD 04/27/2021, 11:48 AM PGY-1, [place] Intern pager: [phone], text pages welcome

## 2021-04-27 NOTE — Progress Notes (Addendum)
Went to patient bedside with Dr. Zigmund Daniel for evaluation. Patient does not have any complaints this morning and has been doing well on her oxygen.   Blood pressure (!) 145/82, pulse 76, temperature 98 F (36.7 C), temperature source Oral, resp. rate 18, height 5\' 2"  (1.575 m), weight 64.4 kg, SpO2 97 %. General: NAD, sitting up in bed, daughter at bedside Respiratory: speaking in full sentences, no increased WOB, [place] in place with 2L O2 Neuro: CN II: PERRL CN III, IV,VI: EOMI CV V: Normal sensation in V1, V2, V3 CVII: Symmetric smile and brow raise CN VIII: Normal hearing CN IX,X: Symmetric palate raise  CN XI: 5/5 shoulder shrug CN XII: Symmetric tongue protrusion  UE and LE strength 5/5 Normal sensation in UE and LE bilaterally    A/P: COVID Patient stable on 2L, will continue to monitor respiratory status. Currently on not on steroids due to hyponatremia and history of steroid-induced SIADH.   Hyponatremia Improving to 127. Overnight labs were discontinued by the lab, will resume the q4h serum chemistry checks to monitor hyponatremia. Called the laboratory to inquire as to why the labs were rescheduled for the morning and there was no reason listed, they recommended calling phlebotomy who stated that they cannot cancel orders. Called the RN in the ER who was unaware of the labs not having been drawn or if there is a policy to notify of cancelled labs and orders. Patient reassuringly has had an increase in her sodium that was not excessive (127). Next draw to be completed at 9am.   The rest of the plan for the patient is per Dr. Zigmund Daniel progress note.

## 2021-04-27 NOTE — ED Notes (Signed)
Breakfast orders placed 

## 2021-04-27 NOTE — ED Notes (Signed)
Pt changed, linen changed, and peri care provided. New purewick in place.

## 2021-04-27 NOTE — Progress Notes (Signed)
Called nursing and discussed the need for CMP q4hr, orders in place. Na trend 120>127>128. Last CMP obtained at [id number]. Will monitor closely.

## 2021-04-27 NOTE — Progress Notes (Addendum)
Patient's 7 PM and 12 AM BMPs to trend low sodium were canceled by lab on 04/26/2021.  No notification or page received.  Merrily Brittle, DO 04/27/2021, 6:48 AM PGY-1, [place] Intern pager: [phone], text pages welcome

## 2021-04-27 NOTE — ED Notes (Signed)
Pt moved to room, changed and purwick placed. Daughter at bedside.

## 2021-04-27 NOTE — Evaluation (Signed)
Physical Therapy Evaluation Patient Details Name: Cassidy Ellison MRN: [id number] DOB: 11-17-1921 Today's Date: 04/27/2021  History of Present Illness  87 yo female with admission on 12/18 was exposed to Covid in her ALF, now referred to rehab for functional assessment.  Pt is SOB and hypoxic, on new O2, and demonstrating mucus plugs on imaging of bronchii.  PMHx:  COPD, HLD, HTN, DVT, migraine,  Clinical Impression  Pt was seen for mobility to walk on RW, with significant obstacles and difficulties to manage this safely.  Her use of O2 presents a greater fall risk for  home, and after walking was 100% with rest.  Daughter asked for trial on room air to see how pt tolerated it, since she was 97% O2 sat while off briefly with PT for transfers of lines to return to bed post gait.  Follow along with her for progression to walk farther and to monitor tolerances with and without O2 as able. [place]ating [place] closer [place] frame.       Recommendations for follow up therapy are one component of a multi-disciplinary discharge planning process, led by the attending physician.  Recommendations may be updated based on patient status, additional functional criteria and insurance authorization.  Follow Up Recommendations Home health PT    Assistance Recommended at Discharge Intermittent Supervision/Assistance  Functional Status Assessment Patient has had a recent decline in their functional status and demonstrates the ability to make significant improvements in function in a reasonable and predictable amount of time.  Equipment Recommendations  None recommended by PT    Recommendations for Other Services       Precautions / Restrictions Precautions Precautions: Fall Precaution Comments: new confusion per daugther Restrictions Weight Bearing Restrictions: No      Mobility  Bed Mobility Overal bed mobility: Needs Assistance Bed Mobility: Supine to Sit;Sit to Supine     Supine  to sit: Min guard Sit to supine: Min guard   General bed mobility comments: min guard for safety and security    Transfers Overall transfer level: Needs assistance Equipment used: Rolling walker (2 wheels) Transfers: Sit to/from Stand Sit to Stand: Min guard           General transfer comment: no power up assist needed but contact for safety    Ambulation/Gait Ambulation/Gait assistance: Min guard Gait Distance (Feet): 18 Feet Assistive device: Rolling walker (2 wheels);1 person hand held assist Gait Pattern/deviations: Step-to pattern;Step-through pattern;Decreased stride length;Wide base of support Gait velocity: reduced Gait velocity interpretation: <1.31 ft/sec, indicative of household ambulator Pre-gait activities: standing wgt shift and balance skills General Gait Details: pt was in a very confined space with RW and her gait was hindered more by the plethora of lines  Stairs            Wheelchair Mobility    Modified Rankin (Stroke Patients Only)       Balance Overall balance assessment: No apparent balance deficits (not formally assessed) (close to baseline function)                                           Pertinent Vitals/Pain Pain Assessment: No/denies pain    Home Living Family/patient expects to be discharged to:: Other (Comment)                   Additional Comments: [facility]  Prior Function Prior Level of Function : Independent/Modified Independent             Mobility Comments: Rollator to maneuver around faciltiy       Hand Dominance   Dominant Hand: Right    Extremity/Trunk Assessment   Upper Extremity Assessment Upper Extremity Assessment: Defer to OT evaluation RUE Coordination: decreased fine motor LUE Coordination: decreased fine motor    Lower Extremity Assessment Lower Extremity Assessment: Overall WFL for tasks assessed    Cervical / Trunk Assessment Cervical  / Trunk Assessment: Kyphotic  Communication   Communication: No difficulties  Cognition Arousal/Alertness: Awake/alert Behavior During Therapy: WFL for tasks assessed/performed Overall Cognitive Status:  (family implies pt is less intact than recently)                                          General Comments General comments (skin integrity, edema, etc.): Pt walked in her room and was assisted to maneuver with cues and line management.  Her daughter was in attendance and was voicing that she would like her mother to be off O2 for home    Exercises     Assessment/Plan    PT Assessment Patient needs continued PT services  PT Problem List Cardiopulmonary status limiting activity;Decreased activity tolerance;Decreased mobility       PT Treatment Interventions DME instruction;Gait training;Functional mobility training;Therapeutic activities;Therapeutic exercise;Neuromuscular re-education;Balance training;Patient/family education    PT Goals (Current goals can be found in the Care Plan section)  Acute Rehab PT Goals Patient Stated Goal: none stated PT Goal Formulation: With patient/family Time For Goal Achievement: 05/11/21 Potential to Achieve Goals: Good    Frequency Min 3X/week   Barriers to discharge Decreased caregiver support home in [place] and will have higher fall risk with O2 line    Co-evaluation               AM-PAC PT "6 Clicks" Mobility  Outcome Measure Help needed turning from your back to your side while in a flat bed without using bedrails?: A Little Help needed moving from lying on your back to sitting on the side of a flat bed without using bedrails?: A Little Help needed moving to and from a bed to a chair (including a wheelchair)?: A Little Help needed standing up from a chair using your arms (e.g., wheelchair or bedside chair)?: A Little Help needed to walk in hospital room?: A Little Help needed climbing 3-5 steps with a railing? : A  Little 6 Click Score: 18    End of Session Equipment Utilized During Treatment: Gait belt;Oxygen Activity Tolerance: Patient limited by fatigue;Treatment limited secondary to medical complications (Comment) Patient left: in bed;with call bell/phone within reach;with family/visitor present Nurse Communication: Mobility status (minor drop in sats within 90% ranges, recovered at rest on 2L to 100%) PT Visit Diagnosis: Other abnormalities of gait and mobility (R26.89)    Time: 2229-7989 PT Time Calculation (min) (ACUTE ONLY): 33 min   Charges:   PT Evaluation $PT Eval Moderate Complexity: 1 Mod PT Treatments $Gait Training: 8-22 mins       Ramond Dial 04/27/2021, 1:56 PM  Mee Hives, PT PhD Acute Rehab Dept. Number: [place] [facility] [place]

## 2021-04-27 NOTE — Evaluation (Signed)
Occupational Therapy Evaluation Patient Details Name: Cassidy Ellison MRN: [id number] DOB: 12/02/1921 Today's Date: 04/27/2021   History of Present Illness 87 yo female with admission on 12/18 was exposed to Covid in her ALF, now referred to rehab for functional assessment.  Pt is SOB and hypoxic, on new O2, and demonstrating mucus plugs on imaging of bronchii.  PMHx:  COPD, HLD, HTN, DVT, migraine,   Clinical Impression   Pt presents with slightly decreased cognition and activity tolerance. Currently requiring supervision - Min guard A with ADLs and functional transfers/mobility due to these deficits. Pt would likely benefit from [facility] at [facility] to ensure safe transition home. Will follow acutely.      Recommendations for follow up therapy are one component of a multi-disciplinary discharge planning process, led by the attending physician.  Recommendations may be updated based on patient status, additional functional criteria and insurance authorization.   Follow Up Recommendations  Home health OT    Assistance Recommended at Discharge Set up Supervision/Assistance  Functional Status Assessment  Patient has had a recent decline in their functional status and demonstrates the ability to make significant improvements in function in a reasonable and predictable amount of time.  Equipment Recommendations  None recommended by OT    Recommendations for Other Services       Precautions / Restrictions Precautions Precautions: Fall Precaution Comments: new confusion per daugther Restrictions Weight Bearing Restrictions: No      Mobility Bed Mobility Overal bed mobility: Needs Assistance Bed Mobility: Supine to Sit;Sit to Supine     Supine to sit: Min guard Sit to supine: Min guard   General bed mobility comments: min guard for safety and security    Transfers Overall transfer level: Needs assistance Equipment used: Rolling walker (2 wheels) Transfers: Sit to/from Stand Sit to  Stand: Min guard           General transfer comment: no power up assist needed but contact for safety      Balance Overall balance assessment: No apparent balance deficits (not formally assessed)                                         ADL either performed or assessed with clinical judgement   ADL Overall ADL's : Needs assistance/impaired Eating/Feeding: Independent   Grooming: Supervision/safety   Upper Body Bathing: Supervision/ safety   Lower Body Bathing: Supervison/ safety   Upper Body Dressing : Supervision/safety   Lower Body Dressing: Supervision/safety   Toilet Transfer: Min guard;Ambulation;Rolling walker (2 wheels)   Toileting- Clothing Manipulation and Hygiene: Supervision/safety       Functional mobility during ADLs: Min guard;Rolling walker (2 wheels)       Vision Patient Visual Report: No change from baseline       Perception     Praxis      Pertinent Vitals/Pain Pain Assessment: No/denies pain     Hand Dominance Right   Extremity/Trunk Assessment Upper Extremity Assessment Upper Extremity Assessment: Generalized weakness RUE Coordination: decreased fine motor LUE Coordination: decreased fine motor   Lower Extremity Assessment Lower Extremity Assessment: Defer to PT evaluation   Cervical / Trunk Assessment Cervical / Trunk Assessment: Kyphotic   Communication Communication Communication: No difficulties   Cognition Arousal/Alertness: Awake/alert Behavior During Therapy: WFL for tasks assessed/performed Overall Cognitive Status: Within Functional Limits for tasks assessed (A&Ox3 however appearing slightly confused and repeating questions.)  General Comments  Pt walked in her room and was assisted to maneuver with cues and line management.  Her daughter was in attendance and was voicing that she would like her mother to be off O2 for home    Exercises  Exercises: Other exercises (LE strength grossly [facility])   Shoulder Instructions      Home Living Family/patient expects to be discharged to:: Other (Comment)                                 Additional Comments: [facility]      Prior Functioning/Environment Prior Level of Function : Independent/Modified Independent             Mobility Comments: Rollator to maneuver around faciltiy          OT Problem List: Decreased safety awareness      OT Treatment/Interventions: Self-care/ADL training;Therapeutic exercise;DME and/or AE instruction;Therapeutic activities;Patient/family education;Balance training;Cognitive remediation/compensation    OT Goals(Current goals can be found in the care plan section) Acute Rehab OT Goals Patient Stated Goal: none stated OT Goal Formulation: With patient/family Time For Goal Achievement: 05/11/21 Potential to Achieve Goals: Good  OT Frequency: Min 2X/week   Barriers to D/C:            Co-evaluation              AM-PAC OT "6 Clicks" Daily Activity     Outcome Measure Help from another person eating meals?: None Help from another person taking care of personal grooming?: A Little Help from another person toileting, which includes using toliet, bedpan, or urinal?: A Little Help from another person bathing (including washing, rinsing, drying)?: A Little Help from another person to put on and taking off regular upper body clothing?: A Little Help from another person to put on and taking off regular lower body clothing?: A Little 6 Click Score: 19   End of Session Equipment Utilized During Treatment: Rolling walker (2 wheels);Oxygen Nurse Communication: Mobility status  Activity Tolerance: Patient tolerated treatment well Patient left: in bed;with call bell/phone within reach;with family/visitor present  OT Visit Diagnosis: Other symptoms and signs involving cognitive function                Time:  1655-3748 OT Time Calculation (min): 22 min Charges:  OT General Charges $OT Visit: 1 Visit OT Evaluation $OT Eval Low Complexity: 1 Low  Lakyla Biswas C, OT/L  Acute Rehab [place] 04/27/2021, 2:26 PM

## 2021-04-28 LAB — COMPREHENSIVE METABOLIC PANEL
ALT: 22 U/L (ref 0–44)
ALT: 20 U/L (ref 0–44)
AST: 39 U/L (ref 15–41)
AST: 30 U/L (ref 15–41)
Albumin: 3.3 g/dL — ABNORMAL LOW (ref 3.5–5.0)
Albumin: 3 g/dL — ABNORMAL LOW (ref 3.5–5.0)
Alkaline Phosphatase: 47 U/L (ref 38–126)
Alkaline Phosphatase: 41 U/L (ref 38–126)
Anion gap: 9 (ref 5–15)
Anion gap: 8 (ref 5–15)
BUN: 19 mg/dL (ref 8–23)
BUN: 17 mg/dL (ref 8–23)
CO2: 25 mmol/L (ref 22–32)
CO2: 24 mmol/L (ref 22–32)
Calcium: 8.9 mg/dL (ref 8.9–10.3)
Calcium: 8.5 mg/dL — ABNORMAL LOW (ref 8.9–10.3)
Chloride: 95 mmol/L — ABNORMAL LOW (ref 98–111)
Chloride: 93 mmol/L — ABNORMAL LOW (ref 98–111)
Creatinine, Ser: 0.98 mg/dL (ref 0.44–1.00)
Creatinine, Ser: 0.81 mg/dL (ref 0.44–1.00)
GFR, Estimated: 52 mL/min — ABNORMAL LOW (ref >60)
GFR, Estimated: >60 mL/min (ref >60)
Glucose, Bld: 131 mg/dL — ABNORMAL HIGH (ref 70–99)
Glucose, Bld: 181 mg/dL — ABNORMAL HIGH (ref 70–99)
Potassium: 3.8 mmol/L (ref 3.5–5.1)
Potassium: 3.3 mmol/L — ABNORMAL LOW (ref 3.5–5.1)
Sodium: 129 mmol/L — ABNORMAL LOW (ref 135–145)
Sodium: 125 mmol/L — ABNORMAL LOW (ref 135–145)
Total Bilirubin: 0.5 mg/dL (ref 0.3–1.2)
Total Bilirubin: 0.6 mg/dL (ref 0.3–1.2)
Total Protein: 6.8 g/dL (ref 6.5–8.1)
Total Protein: 6.5 g/dL (ref 6.5–8.1)

## 2021-04-28 LAB — CBC WITH DIFFERENTIAL/PLATELET
Abs Immature Granulocytes: 0.06 10*3/uL (ref 0.00–0.07)
Basophils Absolute: 0 10*3/uL (ref 0.0–0.1)
Basophils Relative: 0 %
Eosinophils Absolute: 0 10*3/uL (ref 0.0–0.5)
Eosinophils Relative: 0 %
HCT: 38.2 % (ref 36.0–46.0)
Hemoglobin: 12.6 g/dL (ref 12.0–15.0)
Immature Granulocytes: 1 %
Lymphocytes Relative: 12 %
Lymphs Abs: 1.3 10*3/uL (ref 0.7–4.0)
MCH: 27.7 pg (ref 26.0–34.0)
MCHC: 33 g/dL (ref 30.0–36.0)
MCV: 84 fL (ref 80.0–100.0)
Monocytes Absolute: 0.8 10*3/uL (ref 0.1–1.0)
Monocytes Relative: 8 %
Neutro Abs: 8.9 10*3/uL — ABNORMAL HIGH (ref 1.7–7.7)
Neutrophils Relative %: 79 %
Platelets: 168 10*3/uL (ref 150–400)
RBC: 4.55 MIL/uL (ref 3.87–5.11)
RDW: 12.9 % (ref 11.5–15.5)
WBC: 11.1 10*3/uL — ABNORMAL HIGH (ref 4.0–10.5)
nRBC: 0 % (ref 0.0–0.2)

## 2021-04-28 LAB — D-DIMER, QUANTITATIVE: D-Dimer, Quant: 0.79 ug/mL-FEU — ABNORMAL HIGH (ref 0.00–0.50)

## 2021-04-28 LAB — C-REACTIVE PROTEIN: CRP: 4.3 mg/dL — ABNORMAL HIGH (ref <1.0)

## 2021-04-28 MED ORDER — GUAIFENESIN-DM 100-10 MG/5ML PO SYRP
5.0000 mL | ORAL_SOLUTION | ORAL | Status: DC | PRN
  Administered 2021-04-28: 11:00:00 5 mL via ORAL
  Filled 2021-04-28: qty 5

## 2021-04-28 MED ORDER — GUAIFENESIN 100 MG/5ML PO LIQD
5.0000 mL | ORAL | Status: DC | PRN
  Administered 2021-04-28 – 2021-04-29 (×2): 5 mL via ORAL
  Filled 2021-04-28 (×3): qty 5

## 2021-04-28 MED ORDER — BENZONATATE 100 MG PO CAPS
100.0000 mg | ORAL_CAPSULE | Freq: Three times a day (TID) | ORAL | Status: DC | PRN
  Administered 2021-04-28 – 2021-04-29 (×3): 100 mg via ORAL
  Filled 2021-04-28 (×3): qty 1

## 2021-04-28 MED ORDER — ENOXAPARIN SODIUM 30 MG/0.3ML IJ SOSY
30.0000 mg | PREFILLED_SYRINGE | INTRAMUSCULAR | Status: DC
  Administered 2021-04-28: 20:00:00 30 mg via SUBCUTANEOUS
  Filled 2021-04-28: qty 0.3

## 2021-04-28 NOTE — Progress Notes (Signed)
Mobility Specialist Progress Note:   04/28/21 1450  Mobility  Activity Ambulated in room  Level of Assistance Standby assist, set-up cues, supervision of patient - no hands on  Assistive Device Front wheel walker  Distance Ambulated (ft) 115 ft  Mobility Ambulated with assistance in room  Mobility Response Tolerated well  Mobility performed by Mobility specialist  Bed Position Chair  $Mobility charge 1 Mobility   Pt eager for mobility. Required standbyA for safety. Pt with no c/o SOB during ambulation on RA. Back in chair with all needs met.   Nelta Numbers Mobility Specialist  Phone [phone]

## 2021-04-28 NOTE — Progress Notes (Signed)
Pt arrived from ED with son at the bedside. Pt was alert to self. VS taken and stable, placed pt on tele and rhythm verified with CCMD. Assessment preformed and documented.

## 2021-04-28 NOTE — Progress Notes (Signed)
FPTS Brief Progress Note  S: Resting in bed   O: BP 118/70 (BP Location: Left Arm)    Pulse 86    Temp 97.9 F (36.6 C) (Oral)    Resp 18    Ht 5\' 2"  (1.575 m)    Wt 64.4 kg    SpO2 96%    BMI 25.97 kg/m   General:No acute distress. Age appropriate. 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 Labs for AM ordered, which was adjusted as needed.  - Fluid restrict  Gerlene Fee, DO 04/28/2021, 10:17 PM PGY-3, [facility] Night Resident  Please page [phone] with questions.

## 2021-04-28 NOTE — Progress Notes (Signed)
FPTS PM Check:   Pt is in chair with daughter at bedside. Pt's daughter reports that she is still slightly confused but not as aggressive as she was yesterday. Per daughter, this can happen when she is in the hospital. Will continue to monitor mental status. She is currently A&Ox2, as she was this morning. She has had a slow gradual decline over the last couple of years in mental status baseline, but daughter reports that she is still more confused than baseline. Likely cause is multifactorial in nature, acute delirium. Tomorrow, will re-evaluate and decide on further imaging. BMP scheduled for 5PM.

## 2021-04-28 NOTE — Progress Notes (Signed)
Family Medicine Teaching Service Daily Progress Note Intern Pager: [phone]  Patient name: Cassidy Ellison record number: [id number] Date of birth: 1922-03-18 Age: 87 y.o. Gender: female  Primary Care Provider: Harlan Stains, MD Consultants: None  Code Status: DNR   Pt Overview and Major Events to Date:  12/18: Admitted to FPTS   Assessment and Plan: Cassidy Ellison is a 87 yo F presenting with SOB 2/2 COVID and likely COPD. PMHx COPD, HLD, HTN, DVT (no anticoagulation), and atypical migraine.   COVID-19   COPD CTAP negative for PE. D-dimer 1.18>0.79, CRP 5.7>4.3, procal 0.10, Na 127>129, WBC elevated from 5.1>11.1. Currently, -pt is satting on RA since ~0500 this morning.   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 Na today 127>129. Gradually increasing sodium level, maximum of 10 in 24 hours. Can likely spread CMPs to q12 now that sodium continues to increase. Continue with fluid restriction.  -- Holding steroids -- Goals in 130s  -- q12hr -- Fluid restriction to 1200 mL   Acute Encephalopathy, improved Patient has had some altered mental status since later yesterday and into the night last night.  This is likely multifactorial, as the patient is in an unknown location, hyponatremic, has COVID infection, and likely has some sort of age component.  She is doing much better this morning, and we plan to monitor her symptoms.  We will create a soothing environment and add delirium precautions. -- Monitor symptoms  -- Add delirium precautions   Hypertension  Blood pressure range: 90s-160s/40s-80s -Holding home medications currently  RLS -- Continue with home Ropinirole dose   GERD -- Protonix 40 mg   FEN/GI: Regular diet with 1200 mL fluid restriction  PPx: Lovenox  Dispo:Home tomorrow pending improvement of encephalopathy and ambulation  without oxygen.   Subjective:  Pt reports that she is breathing much better and has no new complaints. Denies urinary symptoms or abdominal pain.    Daughter is expressing concern, that the pt was confused overnight, more than normal.   Objective: Temp:  [97.9 F (36.6 C)-98.1 F (36.7 C)] 98.1 F (36.7 C) (12/20 0524) Pulse Rate:  [73-97] 83 (12/20 0524) Resp:  [14-33] 16 (12/20 0524) BP: (92-161)/(46-108) 92/70 (12/20 0524) SpO2:  [94 %-100 %] 97 % (12/20 0524) Physical Exam: General: NAD, sitting up in chair  Cardiovascular: RRR Respiratory: CTAB Abdomen: Nontender Extremities: FROM Neuro: A&Ox2, to person and time.   Laboratory: Recent Labs  Lab 04/26/21 1302 04/27/21 0524 04/28/21 0226  WBC 5.4 5.1 11.1*  HGB 11.8* 12.5 12.6  HCT 35.9* 36.6 38.2  PLT 135* 165 168   Recent Labs  Lab 04/27/21 1718 04/27/21 2116 04/28/21 0226  NA 129* 127* 129*  K 3.6 3.7 3.8  CL 98 97* 95*  CO2 23 23 25   BUN 18 20 19   CREATININE 1.01* 0.84 0.98  CALCIUM 8.8* 8.7* 8.9  PROT 6.9 6.7 6.8  BILITOT 0.4 0.5 0.5  ALKPHOS 45 48 47  ALT 23 22 22   AST 40 33 39  GLUCOSE 156* 169* 131*     Erskine Emery, MD 04/28/2021, 9:40 AM PGY-1, [place] Intern pager: [phone], text pages welcome

## 2021-04-28 NOTE — Progress Notes (Addendum)
FPTS Brief Progress Note  S: Cassidy Ellison was seen this p.m. sleeping comfortably, did not awaken patient.   O: BP (!) 156/74 (BP Location: Right Arm)    Pulse 97    Temp 97.9 F (36.6 C) (Oral)    Resp 18    Ht 5\' 2"  (1.575 m)    Wt 64.4 kg    SpO2 96%    BMI 25.97 kg/m   Physical Exam Vitals and nursing note reviewed.  Constitutional:      General: She is sleeping. She is not in acute distress.    Appearance: She is not ill-appearing, toxic-appearing or diaphoretic.  Cardiovascular:     Rate and Rhythm: Normal rate.  Pulmonary:     Effort: Pulmonary effort is normal.     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  P.m. Na+ 127, 129.  We will continue to monitor closely. Holding steroids CMP q4hr Fluid restriction to 1.2 L  Cough Per RN, patient complained of cough and requested something to alleviate it. Delsym x1 dose  - Orders reviewed. Labs for AM ordered, which was adjusted as needed.   Remainder of plan per day team/daily progress note.  Merrily Brittle, DO 04/28/2021, 3:53 AM PGY-1, [facility] Night Resident  Please page [phone] with questions.

## 2021-04-29 ENCOUNTER — Other Ambulatory Visit (HOSPITAL_COMMUNITY): Payer: Self-pay

## 2021-04-29 LAB — CBC WITH DIFFERENTIAL/PLATELET
Abs Immature Granulocytes: 0.04 10*3/uL (ref 0.00–0.07)
Basophils Absolute: 0 10*3/uL (ref 0.0–0.1)
Basophils Relative: 0 %
Eosinophils Absolute: 0 10*3/uL (ref 0.0–0.5)
Eosinophils Relative: 0 %
HCT: 36.4 % (ref 36.0–46.0)
Hemoglobin: 12.4 g/dL (ref 12.0–15.0)
Immature Granulocytes: 1 %
Lymphocytes Relative: 19 %
Lymphs Abs: 1.5 10*3/uL (ref 0.7–4.0)
MCH: 28 pg (ref 26.0–34.0)
MCHC: 34.1 g/dL (ref 30.0–36.0)
MCV: 82.2 fL (ref 80.0–100.0)
Monocytes Absolute: 0.7 10*3/uL (ref 0.1–1.0)
Monocytes Relative: 9 %
Neutro Abs: 5.7 10*3/uL (ref 1.7–7.7)
Neutrophils Relative %: 71 %
Platelets: 151 10*3/uL (ref 150–400)
RBC: 4.43 MIL/uL (ref 3.87–5.11)
RDW: 12.7 % (ref 11.5–15.5)
WBC: 7.9 10*3/uL (ref 4.0–10.5)
nRBC: 0 % (ref 0.0–0.2)

## 2021-04-29 LAB — COMPREHENSIVE METABOLIC PANEL
ALT: 18 U/L (ref 0–44)
AST: 24 U/L (ref 15–41)
Albumin: 2.7 g/dL — ABNORMAL LOW (ref 3.5–5.0)
Alkaline Phosphatase: 37 U/L — ABNORMAL LOW (ref 38–126)
Anion gap: 6 (ref 5–15)
BUN: 15 mg/dL (ref 8–23)
CO2: 25 mmol/L (ref 22–32)
Calcium: 8.3 mg/dL — ABNORMAL LOW (ref 8.9–10.3)
Chloride: 96 mmol/L — ABNORMAL LOW (ref 98–111)
Creatinine, Ser: 0.66 mg/dL (ref 0.44–1.00)
GFR, Estimated: >60 mL/min (ref >60)
Glucose, Bld: 103 mg/dL — ABNORMAL HIGH (ref 70–99)
Potassium: 3.4 mmol/L — ABNORMAL LOW (ref 3.5–5.1)
Sodium: 127 mmol/L — ABNORMAL LOW (ref 135–145)
Total Bilirubin: 0.6 mg/dL (ref 0.3–1.2)
Total Protein: 6 g/dL — ABNORMAL LOW (ref 6.5–8.1)

## 2021-04-29 MED ORDER — ACETAMINOPHEN 325 MG PO TABS: 650.0000 mg | ORAL_TABLET | Freq: Four times a day (QID) | ORAL | Status: DC | PRN

## 2021-04-29 MED ORDER — ANORO ELLIPTA 62.5-25 MCG/ACT IN AEPB
1.0000 | INHALATION_SPRAY | Freq: Every day | RESPIRATORY_TRACT | 0 refills | Status: AC
Start: 2021-04-30 — End: ?
  Filled 2021-04-29: qty 60, 30d supply, fill #0

## 2021-04-29 MED ORDER — BENZONATATE 100 MG PO CAPS
100.0000 mg | ORAL_CAPSULE | Freq: Three times a day (TID) | ORAL | 0 refills | Status: AC | PRN
  Filled 2021-04-29: qty 20, 7d supply, fill #0

## 2021-04-29 MED ORDER — POTASSIUM CHLORIDE CRYS ER 20 MEQ PO TBCR: 40.0000 meq | EXTENDED_RELEASE_TABLET | Freq: Two times a day (BID) | ORAL | Status: DC

## 2021-04-29 MED ORDER — ALBUTEROL SULFATE HFA 108 (90 BASE) MCG/ACT IN AERS
2.0000 | INHALATION_SPRAY | RESPIRATORY_TRACT | 0 refills | Status: AC | PRN
  Filled 2021-04-29: qty 8.5, 18d supply, fill #0

## 2021-04-29 NOTE — Progress Notes (Signed)
Mobility Specialist Progress Note:   04/29/21 1050  Mobility  Activity Ambulated in room  Level of Assistance Standby assist, set-up cues, supervision of patient - no hands on  Assistive Device Front wheel walker  Distance Ambulated (ft) 100 ft  Mobility Ambulated with assistance in room;Sit up in bed/chair position for meals  Mobility Response Tolerated well  Mobility performed by Mobility specialist  Bed Position Chair  $Mobility charge 1 Mobility   SATURATION QUALIFICATIONS: (This note is used to comply with regulatory documentation for home oxygen)  Patient Saturations on Room Air at Rest = 93%  Patient Saturations on Room Air while Ambulating = 91%  Pt asx during ambulation. SpO2 stayed at 91% on RA throughout session. Pt back in chair with all needs met.   Nelta Numbers Mobility Specialist  Phone [phone]

## 2021-04-29 NOTE — Plan of Care (Signed)

## 2021-04-29 NOTE — TOC Initial Note (Signed)
Transition of Care [facility]) - Initial/Assessment Note    Patient Details  Name: Cassidy Ellison MRN: [id number] Date of Birth: [birth year]  Transition of Care [facility]) CM/SW Contact:    Marilu Favre, RN Phone Number: 04/29/2021, 10:28 AM  Clinical Narrative:                 Spoke to patient's daughter Larence Penning via phone [phone] .   Patient from [place]. [name] wants her mother to return with HHPT/OT through [employer].   Called [facility] with Leandrew Koyanagi , Rollene Fare can accept and requested orders and face to face be faxed t [facility] at [phone], same done   Expected Discharge Plan: [place] Barriers to Discharge: No Barriers Identified   Patient Goals and CMS Choice Patient states their goals for this hospitalization and ongoing recovery are:: to return to home CMS Medicare.gov Compare Post Acute Care list provided to:: Patient Choice offered to / list presented to : Patient, Adult Children  Expected Discharge Plan and Services Expected Discharge Plan: [place]   Discharge Planning Services: CM Consult Post Acute Care Choice: [place] arrangements for the past 2 months: [place] Expected Discharge Date: 04/29/21               DME Arranged: N/A DME Agency: NA       HH Arranged: PT, OT [place] Agency: Other - See comment Secondary school teacher) Date HH Agency Contacted: 04/29/21 Time [place]: 1027 Representative spoke with at [place]: [place] Arrangements/Services Living arrangements for the past 2 months: [place] Lives with:: Self Patient language and need for interpreter reviewed:: Yes Do you feel safe going back to the place where you live?: Yes      Need for Family Participation in Patient Care: Yes (Comment) Care giver support system in place?: Yes (comment) Current home services: DME Criminal Activity/Legal Involvement Pertinent to Current  Situation/Hospitalization: No - Comment as needed  Activities of Daily Living      Permission Sought/Granted   Permission granted to share information with : Yes, Verbal Permission Granted  Share Information with NAME: Larence Penning daughter  Permission granted to share info w AGENCY: Building surveyor        Emotional Assessment Appearance:: Appears stated age            Admission diagnosis:  Hyponatremia [E87.1] COVID-19 virus infection [U07.1] COVID-19 [U07.1] Patient Active Problem List   Diagnosis Date Noted   COVID-19 virus infection 04/27/2021   COVID-19 04/26/2021   Pre-ulcerative corn or callous 04/09/2021   Age-related osteoporosis without current pathological fracture 06/30/2020   Allergic rhinitis 06/30/2020   Amnesia 06/30/2020   Dyslipidemia 06/30/2020   Hardening of the aorta (main artery of the heart) ([place]) 06/30/2020   Irritable bowel syndrome with diarrhea 06/30/2020   Personal history of transient ischemic attack (TIA), and cerebral infarction without residual deficits 06/30/2020   Postcholecystectomy diarrhea 06/30/2020   Psoriasis 06/30/2020   Recurrent falls 06/30/2020   Thyroid nodule 06/30/2020   Urinary incontinence 06/30/2020   Vitamin D deficiency 06/30/2020   Left ear impacted cerumen 06/17/2020   Otorrhea, right 06/17/2020   Bilateral hearing loss 06/17/2020   Wheezing 02/09/2018   Pulmonary nodule 02/09/2018   Palpitations 08/12/2015   HTN (hypertension) 08/12/2015   Acute confusional state 08/12/2015   Cerebrovascular accident (CVA) ([place])    Hyperlipidemia    TIA (transient ischemic attack) 06/13/2015  Influenza with pneumonia 05/17/2015   Essential hypertension 05/15/2015   GERD (gastroesophageal reflux disease) 05/15/2015   Cough 98/00/1239   Complicated migraine 35/94/0905   RLS (restless legs syndrome)    Hyponatremia    Stridor    Dehydration 05/11/2015   UTI (lower urinary tract infection) 05/11/2015   Acute bronchitis with  bronchospasm 05/11/2015   PCP:  Harlan Stains, MD Pharmacy:   [facility], [place] [address] Phone: [phone] Fax: [phone]  [facility] [place], [place] - [address] [place] [address] Phone: [phone] Fax: [phone]  [facility] [address] [place] [address] Phone: [phone] Fax: [phone]     Social Determinants of Health (SDOH) Interventions    Readmission Risk Interventions No flowsheet data found.

## 2021-04-29 NOTE — Discharge Summary (Signed)
Cassidy Ellison  Patient name: Cassidy Ellison record number: [id number] Date of birth: 11-Aug-1921 Age: 87 y.o. Gender: female Date of Admission: 04/26/2021  Date of Discharge: 04/29/21 Admitting Physician: Erskine Emery, MD  Primary Care Provider: Harlan Stains, MD Consultants: None   Indication for Hospitalization: Hyponatremia and COVID 19 requiring oxygen   Discharge Diagnoses/Problem List:  Principal Problem:   COVID-19 Active Problems:   Hyponatremia   Essential hypertension   GERD (gastroesophageal reflux disease)   RLS (restless legs syndrome)   Acute confusional state   COVID-19 virus infection   Disposition: ALF  Discharge Condition: Stable   Discharge Exam: Blood pressure 139/76, pulse 84, temperature 98.4 F (36.9 C), resp. rate 19, height 5\' 2"  (1.575 m), weight 64.4 kg, SpO2 91 %. General: Alert and oriented in no apparent distress, sitting in her chair with daughter at bedside  Heart: Regular rate and rhythm with no murmurs appreciated Lungs: CTA bilaterally, no wheezing Abdomen: Nontender to palpation  Extremities: FROM    Brief Hospital Course:  Cassidy Ellison is a 87 yo F presenting with SOB. PMHx COPD, HLD, HTN, DVT (no anticoagulation), and atypical migraine.   Acute Hypoxic Respiratory failure 2/2 COVID-19   COPD Patient brought in by EMS with hypoxia requiring supplemental oxygen. CXR showed prominent bronchial markings and COVID test was positive. 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  Patient was transitioned to room air and tolerated well.  Inflammatory markers down trended throughout stay. Discharge home with home health PT/OT.   Hyponatremia   Hx of SIADH On presentation, the patient was hyponatremic to 120.  We continued with fluid restriction and monitored sodium levels very closely.  She had a gradual increase in  sodium, and on discharge had sodium of 127 with a baseline sodium generally in the lower 130s.  Urine sodium, osmolality, serum osmolality were all obtained and pointed to SIADH as the cause of patient's hyponatremia. 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 Will need outpatient follow up of electrolytes.  Acute Encephalopathy 2/2 Delirium  Patient had acute encephalopathy that was likely multifactorial in nature secondary to delirium.  This improved throughout the patient's stay and as she improved clinically.  HTN: Helding blood pressure medications due to episodes of soft diastolic pressures. These were also held on discharge.   Other chronic conditions were treated with home medications and monitored.    Items for Follow Up:  Follow up BMP for hyponatremia with PCP in one week  Monitor acute encephalopathy by assessing mental status  Monitor oxygen status   We are holding all of this patient's BP meds on discharge due to borderline-low readings throughout hospitalization. Please reassess the need for anti-hypertensives.     Significant Labs and Imaging:  Recent Labs  Lab 04/27/21 0524 04/28/21 0226 04/29/21 0624  WBC 5.1 11.1* 7.9  HGB 12.5 12.6 12.4  HCT 36.6 38.2 36.4  PLT 165 168 151   Recent Labs  Lab 04/27/21 1718 04/27/21 2116 04/28/21 0226 04/28/21 1614 04/29/21 0624  NA 129* 127* 129* 125* 127*  K 3.6 3.7 3.8 3.3* 3.4*  CL 98 97* 95* 93* 96*  CO2 23 23 25 24 25   GLUCOSE 156* 169* 131* 181* 103*  BUN 18 20 19 17 15   CREATININE 1.01* 0.84 0.98 0.81 0.66  CALCIUM 8.8* 8.7* 8.9 8.5* 8.3*  ALKPHOS 45 48 47 41  37*  AST 40 33 39 30 24  ALT 23 22 22 20 18   ALBUMIN 3.3* 3.2* 3.3* 3.0* 2.7*      Results/Tests Pending at Time of Discharge: NONE   Discharge Medications:  Allergies as of 04/29/2021       Reactions   Corticosteroids Other (See Comments)   Caused patient to have low sodium levels   Adhesive [tape] Itching    Chlorthalidone Other (See Comments)   creatinine  increases Other reaction(s): elevated creatinine Other reaction(s): elevated creatinine Other reaction(s): elevated creatinine   Denture Adhesive    Other reaction(s): rash   Hydrochlorothiazide Other (See Comments)   CREATININE INCREASES Other reaction(s): elevated creatinine Other reaction(s): elevated creatinine Other reaction(s): elevated creatinine   Methoxsalen    Other reaction(s): cream--rash Other reaction(s): cream--rash Other reaction(s): cream--rash   Other    Other reaction(s): rash Other reaction(s): rash   Prednisone    Other reaction(s): hyponatremia Other reaction(s): hyponatremia Other reaction(s): hyponatremia        Medication List     STOP taking these medications    amLODipine 5 MG tablet Commonly known as: NORVASC   clopidogrel 75 MG tablet Commonly known as: PLAVIX   cyclobenzaprine 5 MG tablet Commonly known as: FLEXERIL   fluticasone 50 MCG/ACT nasal spray Commonly known as: FLONASE   losartan 100 MG tablet Commonly known as: COZAAR   metoprolol succinate 50 MG 24 hr tablet Commonly known as: TOPROL-XL   Paxlovid (150/100) 10 x 150 MG & 10 x 100MG  Tbpk Generic drug: nirmatrelvir & ritonavir       TAKE these medications    acetaminophen 325 MG tablet Commonly known as: TYLENOL Take 2 tablets (650 mg total) by mouth every 6 (six) hours as needed for mild pain or headache (fever >/= 101).   albuterol 108 (90 Base) MCG/ACT inhaler Commonly known as: VENTOLIN HFA Inhale 2 puffs into the lungs every 4 (four) hours as needed for wheezing or shortness of breath.   alendronate 70 MG tablet Commonly known as: FOSAMAX   Anoro Ellipta 62.5-25 MCG/ACT Aepb Generic drug: umeclidinium-vilanterol Inhale 1 puff into the lungs daily. Start taking on: April 30, 2021 What changed:  medication strength See the new instructions. Another medication with the same name was removed.  Continue taking this medication, and follow the directions you see here.   atorvastatin 20 MG tablet Commonly known as: LIPITOR Take 20 mg by mouth daily.   benzonatate 100 MG capsule Commonly known as: TESSALON Take 1 capsule (100 mg total) by mouth 3 (three) times daily as needed for cough.   docusate sodium 100 MG capsule Commonly known as: COLACE 100 mg daily as needed for mild constipation.   loperamide 2 MG capsule Commonly known as: IMODIUM Take 2 mg by mouth as needed for diarrhea or loose stools.   multivitamin with minerals tablet Take 1 tablet by mouth daily.   pantoprazole 40 MG tablet Commonly known as: PROTONIX   rOPINIRole 1 MG tablet Commonly known as: REQUIP Take 1 mg by mouth at bedtime.   vitamin C 500 MG tablet Commonly known as: ASCORBIC ACID Take 500 mg by mouth daily.   Vitamin D-3 25 MCG (1000 UT) Caps Take 1,000 Units by mouth daily.        Discharge Instructions: Please refer to Patient Instructions section of EMR for full details.  Patient was counseled important signs and symptoms that should prompt return to medical care, changes in medications, dietary instructions, activity restrictions,  and follow up appointments.   Follow-Up Appointments:  Follow-up Information     Harlan Stains, MD. Schedule an appointment as soon as possible for a visit in 1 week(s).   Specialty: Family Medicine Why: Hospital follow up Contact information: [place] [address] [place]. Follow up.   Specialty: Physical Therapy Contact information: [address] [place] [address] [phone]                 Erskine Emery, MD 04/29/2021, 11:47 AM PGY-1, [place]

## 2021-04-29 NOTE — Discharge Instructions (Addendum)
Thank you for letting us care for you during your stay.  You were admitted to the [facility] Teaching Service.   You were admitted for COVID and low sodium as well as the need for oxygen.   We recommend follow up specifically to check your low sodium and how well you are breathing at home with no oxygen. You will have completed 4 doses of Remdesivir. We will not send you home on anymore Remdesivir. We are holding your blood pressure medications including amlodipine, metoprolol, and Cozaar. If your pressure gets higher than 140/90, please start Amlodipine first, while holding off on the other two. If your pressure remains this high, start back home dose Metoprolol second in addition to Amlodipine. Last addition would be your Losartan if your pressure still remains that high with the first two medications, as this medication can alter your sodium. Please return if you have worsening shortness of breath, confusion, chest pain, consistent fever. We have put in the order for your home physical therapy.   Please call and follow up with your primary care physician to recheck your sodium, mental status, and blood pressure in 3 days, on Friday.   If your symptoms worsen or return, please return to the hospital.  Please let us know if you have questions about your stay at [facility].

## 2021-04-29 NOTE — Progress Notes (Signed)
Occupational Therapy Treatment Patient Details Name: Cassidy Ellison MRN: [id number] DOB: 06-03-1921 Today's Date: 04/29/2021   History of present illness 87 yo female with admission on 12/18 was exposed to Covid in her ALF, now referred to rehab for functional assessment.  Pt is SOB and hypoxic, on new O2, and demonstrating mucus plugs on imaging of bronchii.  PMHx:  COPD, HLD, HTN, DVT, migraine,   OT comments  Pt making progress with functional goals. Pt pleasantly confused with STM deficits, repeating same questions. OT will continue to follow acutely to maximize level of function and safety   Recommendations for follow up therapy are one component of a multi-disciplinary discharge planning process, led by the attending physician.  Recommendations may be updated based on patient status, additional functional criteria and insurance authorization.    Follow Up Recommendations  Home health OT    Assistance Recommended at Discharge Intermittent Supervision/Assistance  Equipment Recommendations  None recommended by OT    Recommendations for Other Services      Precautions / Restrictions Precautions Precautions: Fall Restrictions Weight Bearing Restrictions: No       Mobility Bed Mobility Overal bed mobility: Needs Assistance Bed Mobility: Supine to Sit;Sit to Supine     Supine to sit: Min guard     General bed mobility comments: min guard for safety and security    Transfers Overall transfer level: Needs assistance Equipment used: Rolling walker (2 wheels) Transfers: Sit to/from Stand Sit to Stand: Min guard           General transfer comment: no power up assist needed but contact for safety     Balance Overall balance assessment: No apparent balance deficits (not formally assessed)                                         ADL either performed or assessed with clinical judgement   ADL Overall ADL's : Needs assistance/impaired     Grooming:  Wash/dry hands;Wash/dry face;Standing;Min guard;Cueing for safety               Lower Body Dressing: Min guard;Supervision/safety;Sitting/lateral leans;Sit to/from stand;Cueing for safety   Toilet Transfer: Min guard;Ambulation;Rolling walker (2 wheels);Cueing for safety   Toileting- Water quality scientist and Hygiene: Min guard;Supervision/safety;Sit to/from stand;Cueing for safety       Functional mobility during ADLs: Min guard;Rolling walker (2 wheels);Cueing for safety      Extremity/Trunk Assessment Upper Extremity Assessment Upper Extremity Assessment: Generalized weakness;RUE deficits/detail RUE Coordination: decreased fine motor LUE Coordination: decreased fine motor   Lower Extremity Assessment Lower Extremity Assessment: Defer to PT evaluation   Cervical / Trunk Assessment Cervical / Trunk Assessment: Kyphotic    Vision Baseline Vision/History: 1 Wears glasses Ability to See in Adequate Light: 0 Adequate Patient Visual Report: No change from baseline     Perception     Praxis      Cognition Arousal/Alertness: Awake/alert Behavior During Therapy: WFL for tasks assessed/performed Overall Cognitive Status: Impaired/Different from baseline Area of Impairment: Memory;Attention;Awareness                     Memory: Decreased short-term memory                    Exercises     Shoulder Instructions       General Comments      Pertinent Vitals/ Pain  Pain Assessment: No/denies pain  Home Living                                          Prior Functioning/Environment              Frequency  Min 2X/week        Progress Toward Goals  OT Goals(current goals can now be found in the care plan section)  Progress towards OT goals: Progressing toward goals     Plan Discharge plan remains appropriate    Co-evaluation                 AM-PAC OT "6 Clicks" Daily Activity     Outcome Measure    Help from another person eating meals?: None Help from another person taking care of personal grooming?: A Little Help from another person toileting, which includes using toliet, bedpan, or urinal?: A Little Help from another person bathing (including washing, rinsing, drying)?: A Little Help from another person to put on and taking off regular upper body clothing?: A Little Help from another person to put on and taking off regular lower body clothing?: A Little 6 Click Score: 19    End of Session Equipment Utilized During Treatment: Rolling walker (2 wheels);Gait belt  OT Visit Diagnosis: Other symptoms and signs involving cognitive function   Activity Tolerance Patient tolerated treatment well   Patient Left in bed;with call bell/phone within reach   Nurse Communication                 Charges: OT General Charges $OT Visit: 1 Visit OT Treatments $Self Care/Home Management : 8-22 mins    Britt Bottom 04/29/2021, 2:09 PM

## 2021-05-01 LAB — CULTURE, BLOOD (SINGLE): Culture: NO GROWTH

## 2021-05-05 DIAGNOSIS — I700 Atherosclerosis of aorta: Secondary | ICD-10-CM | POA: Diagnosis not present

## 2021-05-05 DIAGNOSIS — R32 Unspecified urinary incontinence: Secondary | ICD-10-CM | POA: Diagnosis not present

## 2021-05-05 DIAGNOSIS — R29818 Other symptoms and signs involving the nervous system: Secondary | ICD-10-CM | POA: Diagnosis not present

## 2021-05-05 DIAGNOSIS — E871 Hypo-osmolality and hyponatremia: Secondary | ICD-10-CM | POA: Diagnosis not present

## 2021-05-05 DIAGNOSIS — U071 COVID-19: Secondary | ICD-10-CM | POA: Diagnosis not present

## 2021-05-06 DIAGNOSIS — R278 Other lack of coordination: Secondary | ICD-10-CM | POA: Diagnosis not present

## 2021-05-06 DIAGNOSIS — M6281 Muscle weakness (generalized): Secondary | ICD-10-CM | POA: Diagnosis not present

## 2021-05-06 DIAGNOSIS — N3941 Urge incontinence: Secondary | ICD-10-CM | POA: Diagnosis not present

## 2021-05-06 DIAGNOSIS — R2681 Unsteadiness on feet: Secondary | ICD-10-CM | POA: Diagnosis not present

## 2021-05-06 DIAGNOSIS — R41841 Cognitive communication deficit: Secondary | ICD-10-CM | POA: Diagnosis not present

## 2021-05-06 DIAGNOSIS — R32 Unspecified urinary incontinence: Secondary | ICD-10-CM | POA: Diagnosis not present

## 2021-05-08 DIAGNOSIS — N3941 Urge incontinence: Secondary | ICD-10-CM | POA: Diagnosis not present

## 2021-05-08 DIAGNOSIS — M6281 Muscle weakness (generalized): Secondary | ICD-10-CM | POA: Diagnosis not present

## 2021-05-08 DIAGNOSIS — R41841 Cognitive communication deficit: Secondary | ICD-10-CM | POA: Diagnosis not present

## 2021-05-08 DIAGNOSIS — R278 Other lack of coordination: Secondary | ICD-10-CM | POA: Diagnosis not present

## 2021-05-08 DIAGNOSIS — R2681 Unsteadiness on feet: Secondary | ICD-10-CM | POA: Diagnosis not present

## 2021-05-12 DIAGNOSIS — N3941 Urge incontinence: Secondary | ICD-10-CM | POA: Diagnosis not present

## 2021-05-12 DIAGNOSIS — R278 Other lack of coordination: Secondary | ICD-10-CM | POA: Diagnosis not present

## 2021-05-12 DIAGNOSIS — R2681 Unsteadiness on feet: Secondary | ICD-10-CM | POA: Diagnosis not present

## 2021-05-12 DIAGNOSIS — R41841 Cognitive communication deficit: Secondary | ICD-10-CM | POA: Diagnosis not present

## 2021-05-12 DIAGNOSIS — M6281 Muscle weakness (generalized): Secondary | ICD-10-CM | POA: Diagnosis not present

## 2021-05-13 ENCOUNTER — Ambulatory Visit: Payer: Medicare Other | Admitting: Podiatry

## 2021-05-13 DIAGNOSIS — R278 Other lack of coordination: Secondary | ICD-10-CM | POA: Diagnosis not present

## 2021-05-13 DIAGNOSIS — R41841 Cognitive communication deficit: Secondary | ICD-10-CM | POA: Diagnosis not present

## 2021-05-13 DIAGNOSIS — R2681 Unsteadiness on feet: Secondary | ICD-10-CM | POA: Diagnosis not present

## 2021-05-13 DIAGNOSIS — N3941 Urge incontinence: Secondary | ICD-10-CM | POA: Diagnosis not present

## 2021-05-13 DIAGNOSIS — M6281 Muscle weakness (generalized): Secondary | ICD-10-CM | POA: Diagnosis not present

## 2021-05-15 DIAGNOSIS — R2681 Unsteadiness on feet: Secondary | ICD-10-CM | POA: Diagnosis not present

## 2021-05-15 DIAGNOSIS — R41841 Cognitive communication deficit: Secondary | ICD-10-CM | POA: Diagnosis not present

## 2021-05-15 DIAGNOSIS — R278 Other lack of coordination: Secondary | ICD-10-CM | POA: Diagnosis not present

## 2021-05-15 DIAGNOSIS — M6281 Muscle weakness (generalized): Secondary | ICD-10-CM | POA: Diagnosis not present

## 2021-05-15 DIAGNOSIS — N3941 Urge incontinence: Secondary | ICD-10-CM | POA: Diagnosis not present

## 2021-05-18 DIAGNOSIS — M6281 Muscle weakness (generalized): Secondary | ICD-10-CM | POA: Diagnosis not present

## 2021-05-18 DIAGNOSIS — R2681 Unsteadiness on feet: Secondary | ICD-10-CM | POA: Diagnosis not present

## 2021-05-18 DIAGNOSIS — R41841 Cognitive communication deficit: Secondary | ICD-10-CM | POA: Diagnosis not present

## 2021-05-18 DIAGNOSIS — N3941 Urge incontinence: Secondary | ICD-10-CM | POA: Diagnosis not present

## 2021-05-18 DIAGNOSIS — R278 Other lack of coordination: Secondary | ICD-10-CM | POA: Diagnosis not present

## 2021-05-19 DIAGNOSIS — N3941 Urge incontinence: Secondary | ICD-10-CM | POA: Diagnosis not present

## 2021-05-19 DIAGNOSIS — M6281 Muscle weakness (generalized): Secondary | ICD-10-CM | POA: Diagnosis not present

## 2021-05-19 DIAGNOSIS — R41841 Cognitive communication deficit: Secondary | ICD-10-CM | POA: Diagnosis not present

## 2021-05-19 DIAGNOSIS — R278 Other lack of coordination: Secondary | ICD-10-CM | POA: Diagnosis not present

## 2021-05-19 DIAGNOSIS — R2681 Unsteadiness on feet: Secondary | ICD-10-CM | POA: Diagnosis not present

## 2021-05-20 DIAGNOSIS — R278 Other lack of coordination: Secondary | ICD-10-CM | POA: Diagnosis not present

## 2021-05-20 DIAGNOSIS — R41841 Cognitive communication deficit: Secondary | ICD-10-CM | POA: Diagnosis not present

## 2021-05-20 DIAGNOSIS — N3941 Urge incontinence: Secondary | ICD-10-CM | POA: Diagnosis not present

## 2021-05-20 DIAGNOSIS — M6281 Muscle weakness (generalized): Secondary | ICD-10-CM | POA: Diagnosis not present

## 2021-05-20 DIAGNOSIS — R2681 Unsteadiness on feet: Secondary | ICD-10-CM | POA: Diagnosis not present

## 2021-05-21 DIAGNOSIS — E559 Vitamin D deficiency, unspecified: Secondary | ICD-10-CM | POA: Diagnosis not present

## 2021-05-21 DIAGNOSIS — E871 Hypo-osmolality and hyponatremia: Secondary | ICD-10-CM | POA: Diagnosis not present

## 2021-05-21 DIAGNOSIS — R413 Other amnesia: Secondary | ICD-10-CM | POA: Diagnosis not present

## 2021-05-21 DIAGNOSIS — Z8673 Personal history of transient ischemic attack (TIA), and cerebral infarction without residual deficits: Secondary | ICD-10-CM | POA: Diagnosis not present

## 2021-05-21 DIAGNOSIS — F331 Major depressive disorder, recurrent, moderate: Secondary | ICD-10-CM | POA: Diagnosis not present

## 2021-05-21 DIAGNOSIS — D72829 Elevated white blood cell count, unspecified: Secondary | ICD-10-CM | POA: Diagnosis not present

## 2021-05-22 DIAGNOSIS — M6281 Muscle weakness (generalized): Secondary | ICD-10-CM | POA: Diagnosis not present

## 2021-05-22 DIAGNOSIS — R278 Other lack of coordination: Secondary | ICD-10-CM | POA: Diagnosis not present

## 2021-05-22 DIAGNOSIS — R41841 Cognitive communication deficit: Secondary | ICD-10-CM | POA: Diagnosis not present

## 2021-05-22 DIAGNOSIS — N3941 Urge incontinence: Secondary | ICD-10-CM | POA: Diagnosis not present

## 2021-05-22 DIAGNOSIS — R2681 Unsteadiness on feet: Secondary | ICD-10-CM | POA: Diagnosis not present

## 2021-05-25 DIAGNOSIS — R41841 Cognitive communication deficit: Secondary | ICD-10-CM | POA: Diagnosis not present

## 2021-05-25 DIAGNOSIS — R2681 Unsteadiness on feet: Secondary | ICD-10-CM | POA: Diagnosis not present

## 2021-05-25 DIAGNOSIS — M6281 Muscle weakness (generalized): Secondary | ICD-10-CM | POA: Diagnosis not present

## 2021-05-25 DIAGNOSIS — R278 Other lack of coordination: Secondary | ICD-10-CM | POA: Diagnosis not present

## 2021-05-25 DIAGNOSIS — N3941 Urge incontinence: Secondary | ICD-10-CM | POA: Diagnosis not present

## 2021-05-26 DIAGNOSIS — R41841 Cognitive communication deficit: Secondary | ICD-10-CM | POA: Diagnosis not present

## 2021-05-26 DIAGNOSIS — R278 Other lack of coordination: Secondary | ICD-10-CM | POA: Diagnosis not present

## 2021-05-26 DIAGNOSIS — M6281 Muscle weakness (generalized): Secondary | ICD-10-CM | POA: Diagnosis not present

## 2021-05-26 DIAGNOSIS — N3941 Urge incontinence: Secondary | ICD-10-CM | POA: Diagnosis not present

## 2021-05-26 DIAGNOSIS — R2681 Unsteadiness on feet: Secondary | ICD-10-CM | POA: Diagnosis not present

## 2021-05-27 DIAGNOSIS — R278 Other lack of coordination: Secondary | ICD-10-CM | POA: Diagnosis not present

## 2021-05-27 DIAGNOSIS — R41841 Cognitive communication deficit: Secondary | ICD-10-CM | POA: Diagnosis not present

## 2021-05-27 DIAGNOSIS — R2681 Unsteadiness on feet: Secondary | ICD-10-CM | POA: Diagnosis not present

## 2021-05-27 DIAGNOSIS — M6281 Muscle weakness (generalized): Secondary | ICD-10-CM | POA: Diagnosis not present

## 2021-05-27 DIAGNOSIS — N3941 Urge incontinence: Secondary | ICD-10-CM | POA: Diagnosis not present

## 2021-05-28 DIAGNOSIS — M6281 Muscle weakness (generalized): Secondary | ICD-10-CM | POA: Diagnosis not present

## 2021-05-28 DIAGNOSIS — R41841 Cognitive communication deficit: Secondary | ICD-10-CM | POA: Diagnosis not present

## 2021-05-28 DIAGNOSIS — R2681 Unsteadiness on feet: Secondary | ICD-10-CM | POA: Diagnosis not present

## 2021-05-28 DIAGNOSIS — N3941 Urge incontinence: Secondary | ICD-10-CM | POA: Diagnosis not present

## 2021-05-28 DIAGNOSIS — R278 Other lack of coordination: Secondary | ICD-10-CM | POA: Diagnosis not present

## 2021-06-01 DIAGNOSIS — M6281 Muscle weakness (generalized): Secondary | ICD-10-CM | POA: Diagnosis not present

## 2021-06-01 DIAGNOSIS — R278 Other lack of coordination: Secondary | ICD-10-CM | POA: Diagnosis not present

## 2021-06-01 DIAGNOSIS — N3941 Urge incontinence: Secondary | ICD-10-CM | POA: Diagnosis not present

## 2021-06-01 DIAGNOSIS — R41841 Cognitive communication deficit: Secondary | ICD-10-CM | POA: Diagnosis not present

## 2021-06-01 DIAGNOSIS — R2681 Unsteadiness on feet: Secondary | ICD-10-CM | POA: Diagnosis not present

## 2021-06-02 DIAGNOSIS — Z87891 Personal history of nicotine dependence: Secondary | ICD-10-CM | POA: Diagnosis not present

## 2021-06-02 DIAGNOSIS — H61891 Other specified disorders of right external ear: Secondary | ICD-10-CM | POA: Diagnosis not present

## 2021-06-02 DIAGNOSIS — H9211 Otorrhea, right ear: Secondary | ICD-10-CM | POA: Diagnosis not present

## 2021-06-02 DIAGNOSIS — L988 Other specified disorders of the skin and subcutaneous tissue: Secondary | ICD-10-CM | POA: Diagnosis not present

## 2021-06-02 DIAGNOSIS — H938X2 Other specified disorders of left ear: Secondary | ICD-10-CM | POA: Diagnosis not present

## 2021-06-02 DIAGNOSIS — H903 Sensorineural hearing loss, bilateral: Secondary | ICD-10-CM | POA: Diagnosis not present

## 2021-06-02 DIAGNOSIS — H6121 Impacted cerumen, right ear: Secondary | ICD-10-CM | POA: Diagnosis not present

## 2021-06-03 DIAGNOSIS — R278 Other lack of coordination: Secondary | ICD-10-CM | POA: Diagnosis not present

## 2021-06-03 DIAGNOSIS — R41841 Cognitive communication deficit: Secondary | ICD-10-CM | POA: Diagnosis not present

## 2021-06-03 DIAGNOSIS — N3941 Urge incontinence: Secondary | ICD-10-CM | POA: Diagnosis not present

## 2021-06-03 DIAGNOSIS — R2681 Unsteadiness on feet: Secondary | ICD-10-CM | POA: Diagnosis not present

## 2021-06-03 DIAGNOSIS — M6281 Muscle weakness (generalized): Secondary | ICD-10-CM | POA: Diagnosis not present

## 2021-06-04 DIAGNOSIS — N3941 Urge incontinence: Secondary | ICD-10-CM | POA: Diagnosis not present

## 2021-06-04 DIAGNOSIS — M6281 Muscle weakness (generalized): Secondary | ICD-10-CM | POA: Diagnosis not present

## 2021-06-04 DIAGNOSIS — R41841 Cognitive communication deficit: Secondary | ICD-10-CM | POA: Diagnosis not present

## 2021-06-04 DIAGNOSIS — R2681 Unsteadiness on feet: Secondary | ICD-10-CM | POA: Diagnosis not present

## 2021-06-04 DIAGNOSIS — R278 Other lack of coordination: Secondary | ICD-10-CM | POA: Diagnosis not present

## 2021-06-04 DIAGNOSIS — E871 Hypo-osmolality and hyponatremia: Secondary | ICD-10-CM | POA: Diagnosis not present

## 2021-06-09 DIAGNOSIS — M6281 Muscle weakness (generalized): Secondary | ICD-10-CM | POA: Diagnosis not present

## 2021-06-09 DIAGNOSIS — R41841 Cognitive communication deficit: Secondary | ICD-10-CM | POA: Diagnosis not present

## 2021-06-09 DIAGNOSIS — R278 Other lack of coordination: Secondary | ICD-10-CM | POA: Diagnosis not present

## 2021-06-09 DIAGNOSIS — N3941 Urge incontinence: Secondary | ICD-10-CM | POA: Diagnosis not present

## 2021-06-09 DIAGNOSIS — R2681 Unsteadiness on feet: Secondary | ICD-10-CM | POA: Diagnosis not present

## 2021-06-10 DIAGNOSIS — R278 Other lack of coordination: Secondary | ICD-10-CM | POA: Diagnosis not present

## 2021-06-10 DIAGNOSIS — R41841 Cognitive communication deficit: Secondary | ICD-10-CM | POA: Diagnosis not present

## 2021-06-10 DIAGNOSIS — R4701 Aphasia: Secondary | ICD-10-CM | POA: Diagnosis not present

## 2021-06-10 DIAGNOSIS — M6281 Muscle weakness (generalized): Secondary | ICD-10-CM | POA: Diagnosis not present

## 2021-06-10 DIAGNOSIS — R2681 Unsteadiness on feet: Secondary | ICD-10-CM | POA: Diagnosis not present

## 2021-06-10 DIAGNOSIS — N3941 Urge incontinence: Secondary | ICD-10-CM | POA: Diagnosis not present

## 2021-06-11 DIAGNOSIS — R41841 Cognitive communication deficit: Secondary | ICD-10-CM | POA: Diagnosis not present

## 2021-06-11 DIAGNOSIS — R278 Other lack of coordination: Secondary | ICD-10-CM | POA: Diagnosis not present

## 2021-06-11 DIAGNOSIS — R4701 Aphasia: Secondary | ICD-10-CM | POA: Diagnosis not present

## 2021-06-11 DIAGNOSIS — M6281 Muscle weakness (generalized): Secondary | ICD-10-CM | POA: Diagnosis not present

## 2021-06-11 DIAGNOSIS — R2681 Unsteadiness on feet: Secondary | ICD-10-CM | POA: Diagnosis not present

## 2021-06-11 DIAGNOSIS — N3941 Urge incontinence: Secondary | ICD-10-CM | POA: Diagnosis not present

## 2021-06-15 DIAGNOSIS — R278 Other lack of coordination: Secondary | ICD-10-CM | POA: Diagnosis not present

## 2021-06-15 DIAGNOSIS — R41841 Cognitive communication deficit: Secondary | ICD-10-CM | POA: Diagnosis not present

## 2021-06-15 DIAGNOSIS — R2681 Unsteadiness on feet: Secondary | ICD-10-CM | POA: Diagnosis not present

## 2021-06-15 DIAGNOSIS — R4701 Aphasia: Secondary | ICD-10-CM | POA: Diagnosis not present

## 2021-06-15 DIAGNOSIS — M6281 Muscle weakness (generalized): Secondary | ICD-10-CM | POA: Diagnosis not present

## 2021-06-15 DIAGNOSIS — N3941 Urge incontinence: Secondary | ICD-10-CM | POA: Diagnosis not present

## 2021-06-16 DIAGNOSIS — R278 Other lack of coordination: Secondary | ICD-10-CM | POA: Diagnosis not present

## 2021-06-16 DIAGNOSIS — N3941 Urge incontinence: Secondary | ICD-10-CM | POA: Diagnosis not present

## 2021-06-16 DIAGNOSIS — M6281 Muscle weakness (generalized): Secondary | ICD-10-CM | POA: Diagnosis not present

## 2021-06-16 DIAGNOSIS — R4701 Aphasia: Secondary | ICD-10-CM | POA: Diagnosis not present

## 2021-06-16 DIAGNOSIS — R2681 Unsteadiness on feet: Secondary | ICD-10-CM | POA: Diagnosis not present

## 2021-06-16 DIAGNOSIS — R41841 Cognitive communication deficit: Secondary | ICD-10-CM | POA: Diagnosis not present

## 2021-06-17 DIAGNOSIS — M6281 Muscle weakness (generalized): Secondary | ICD-10-CM | POA: Diagnosis not present

## 2021-06-17 DIAGNOSIS — R2681 Unsteadiness on feet: Secondary | ICD-10-CM | POA: Diagnosis not present

## 2021-06-17 DIAGNOSIS — N3941 Urge incontinence: Secondary | ICD-10-CM | POA: Diagnosis not present

## 2021-06-17 DIAGNOSIS — R278 Other lack of coordination: Secondary | ICD-10-CM | POA: Diagnosis not present

## 2021-06-17 DIAGNOSIS — R4701 Aphasia: Secondary | ICD-10-CM | POA: Diagnosis not present

## 2021-06-17 DIAGNOSIS — R41841 Cognitive communication deficit: Secondary | ICD-10-CM | POA: Diagnosis not present

## 2021-06-18 DIAGNOSIS — R278 Other lack of coordination: Secondary | ICD-10-CM | POA: Diagnosis not present

## 2021-06-18 DIAGNOSIS — N3941 Urge incontinence: Secondary | ICD-10-CM | POA: Diagnosis not present

## 2021-06-18 DIAGNOSIS — R4701 Aphasia: Secondary | ICD-10-CM | POA: Diagnosis not present

## 2021-06-18 DIAGNOSIS — R41841 Cognitive communication deficit: Secondary | ICD-10-CM | POA: Diagnosis not present

## 2021-06-18 DIAGNOSIS — M6281 Muscle weakness (generalized): Secondary | ICD-10-CM | POA: Diagnosis not present

## 2021-06-18 DIAGNOSIS — R2681 Unsteadiness on feet: Secondary | ICD-10-CM | POA: Diagnosis not present

## 2021-06-22 DIAGNOSIS — R2681 Unsteadiness on feet: Secondary | ICD-10-CM | POA: Diagnosis not present

## 2021-06-22 DIAGNOSIS — R41841 Cognitive communication deficit: Secondary | ICD-10-CM | POA: Diagnosis not present

## 2021-06-22 DIAGNOSIS — N3941 Urge incontinence: Secondary | ICD-10-CM | POA: Diagnosis not present

## 2021-06-22 DIAGNOSIS — R278 Other lack of coordination: Secondary | ICD-10-CM | POA: Diagnosis not present

## 2021-06-22 DIAGNOSIS — R4701 Aphasia: Secondary | ICD-10-CM | POA: Diagnosis not present

## 2021-06-22 DIAGNOSIS — M6281 Muscle weakness (generalized): Secondary | ICD-10-CM | POA: Diagnosis not present

## 2021-06-24 DIAGNOSIS — R2681 Unsteadiness on feet: Secondary | ICD-10-CM | POA: Diagnosis not present

## 2021-06-24 DIAGNOSIS — R41841 Cognitive communication deficit: Secondary | ICD-10-CM | POA: Diagnosis not present

## 2021-06-24 DIAGNOSIS — M6281 Muscle weakness (generalized): Secondary | ICD-10-CM | POA: Diagnosis not present

## 2021-06-24 DIAGNOSIS — R278 Other lack of coordination: Secondary | ICD-10-CM | POA: Diagnosis not present

## 2021-06-24 DIAGNOSIS — N3941 Urge incontinence: Secondary | ICD-10-CM | POA: Diagnosis not present

## 2021-06-24 DIAGNOSIS — R4701 Aphasia: Secondary | ICD-10-CM | POA: Diagnosis not present

## 2021-06-25 DIAGNOSIS — R2681 Unsteadiness on feet: Secondary | ICD-10-CM | POA: Diagnosis not present

## 2021-06-25 DIAGNOSIS — R41841 Cognitive communication deficit: Secondary | ICD-10-CM | POA: Diagnosis not present

## 2021-06-25 DIAGNOSIS — M6281 Muscle weakness (generalized): Secondary | ICD-10-CM | POA: Diagnosis not present

## 2021-06-25 DIAGNOSIS — N3941 Urge incontinence: Secondary | ICD-10-CM | POA: Diagnosis not present

## 2021-06-25 DIAGNOSIS — R278 Other lack of coordination: Secondary | ICD-10-CM | POA: Diagnosis not present

## 2021-06-25 DIAGNOSIS — R4701 Aphasia: Secondary | ICD-10-CM | POA: Diagnosis not present

## 2021-06-26 DIAGNOSIS — F331 Major depressive disorder, recurrent, moderate: Secondary | ICD-10-CM | POA: Diagnosis not present

## 2021-06-26 DIAGNOSIS — I10 Essential (primary) hypertension: Secondary | ICD-10-CM | POA: Diagnosis not present

## 2021-06-26 DIAGNOSIS — E871 Hypo-osmolality and hyponatremia: Secondary | ICD-10-CM | POA: Diagnosis not present

## 2021-06-26 DIAGNOSIS — R413 Other amnesia: Secondary | ICD-10-CM | POA: Diagnosis not present

## 2021-06-30 DIAGNOSIS — N3941 Urge incontinence: Secondary | ICD-10-CM | POA: Diagnosis not present

## 2021-06-30 DIAGNOSIS — R4701 Aphasia: Secondary | ICD-10-CM | POA: Diagnosis not present

## 2021-06-30 DIAGNOSIS — M6281 Muscle weakness (generalized): Secondary | ICD-10-CM | POA: Diagnosis not present

## 2021-06-30 DIAGNOSIS — R2681 Unsteadiness on feet: Secondary | ICD-10-CM | POA: Diagnosis not present

## 2021-06-30 DIAGNOSIS — R41841 Cognitive communication deficit: Secondary | ICD-10-CM | POA: Diagnosis not present

## 2021-06-30 DIAGNOSIS — R278 Other lack of coordination: Secondary | ICD-10-CM | POA: Diagnosis not present

## 2021-07-06 DIAGNOSIS — R4701 Aphasia: Secondary | ICD-10-CM | POA: Diagnosis not present

## 2021-07-06 DIAGNOSIS — R2681 Unsteadiness on feet: Secondary | ICD-10-CM | POA: Diagnosis not present

## 2021-07-06 DIAGNOSIS — R41841 Cognitive communication deficit: Secondary | ICD-10-CM | POA: Diagnosis not present

## 2021-07-06 DIAGNOSIS — M6281 Muscle weakness (generalized): Secondary | ICD-10-CM | POA: Diagnosis not present

## 2021-07-06 DIAGNOSIS — N3941 Urge incontinence: Secondary | ICD-10-CM | POA: Diagnosis not present

## 2021-07-06 DIAGNOSIS — R278 Other lack of coordination: Secondary | ICD-10-CM | POA: Diagnosis not present

## 2021-07-08 DIAGNOSIS — R2681 Unsteadiness on feet: Secondary | ICD-10-CM | POA: Diagnosis not present

## 2021-07-08 DIAGNOSIS — R4701 Aphasia: Secondary | ICD-10-CM | POA: Diagnosis not present

## 2021-07-08 DIAGNOSIS — R278 Other lack of coordination: Secondary | ICD-10-CM | POA: Diagnosis not present

## 2021-07-08 DIAGNOSIS — M6281 Muscle weakness (generalized): Secondary | ICD-10-CM | POA: Diagnosis not present

## 2021-07-08 DIAGNOSIS — R41841 Cognitive communication deficit: Secondary | ICD-10-CM | POA: Diagnosis not present

## 2021-07-09 DIAGNOSIS — H9211 Otorrhea, right ear: Secondary | ICD-10-CM | POA: Diagnosis not present

## 2021-07-09 DIAGNOSIS — H903 Sensorineural hearing loss, bilateral: Secondary | ICD-10-CM | POA: Diagnosis not present

## 2021-07-15 ENCOUNTER — Other Ambulatory Visit: Payer: Self-pay

## 2021-07-15 ENCOUNTER — Encounter: Payer: Self-pay | Admitting: Podiatry

## 2021-07-15 ENCOUNTER — Ambulatory Visit (INDEPENDENT_AMBULATORY_CARE_PROVIDER_SITE_OTHER): Payer: Medicare Other | Admitting: Podiatry

## 2021-07-15 DIAGNOSIS — M79675 Pain in left toe(s): Secondary | ICD-10-CM

## 2021-07-15 DIAGNOSIS — L84 Corns and callosities: Secondary | ICD-10-CM

## 2021-07-15 DIAGNOSIS — F331 Major depressive disorder, recurrent, moderate: Secondary | ICD-10-CM | POA: Insufficient documentation

## 2021-07-15 DIAGNOSIS — D689 Coagulation defect, unspecified: Secondary | ICD-10-CM | POA: Diagnosis not present

## 2021-07-15 DIAGNOSIS — M79674 Pain in right toe(s): Secondary | ICD-10-CM | POA: Diagnosis not present

## 2021-07-15 DIAGNOSIS — B351 Tinea unguium: Secondary | ICD-10-CM

## 2021-07-15 DIAGNOSIS — M6281 Muscle weakness (generalized): Secondary | ICD-10-CM | POA: Diagnosis not present

## 2021-07-15 DIAGNOSIS — R278 Other lack of coordination: Secondary | ICD-10-CM | POA: Diagnosis not present

## 2021-07-15 DIAGNOSIS — D72829 Elevated white blood cell count, unspecified: Secondary | ICD-10-CM | POA: Insufficient documentation

## 2021-07-15 DIAGNOSIS — R2681 Unsteadiness on feet: Secondary | ICD-10-CM | POA: Diagnosis not present

## 2021-07-15 DIAGNOSIS — R41841 Cognitive communication deficit: Secondary | ICD-10-CM | POA: Diagnosis not present

## 2021-07-15 DIAGNOSIS — R4701 Aphasia: Secondary | ICD-10-CM | POA: Diagnosis not present

## 2021-07-17 DIAGNOSIS — R41841 Cognitive communication deficit: Secondary | ICD-10-CM | POA: Diagnosis not present

## 2021-07-17 DIAGNOSIS — R278 Other lack of coordination: Secondary | ICD-10-CM | POA: Diagnosis not present

## 2021-07-17 DIAGNOSIS — M6281 Muscle weakness (generalized): Secondary | ICD-10-CM | POA: Diagnosis not present

## 2021-07-17 DIAGNOSIS — R4701 Aphasia: Secondary | ICD-10-CM | POA: Diagnosis not present

## 2021-07-17 DIAGNOSIS — R2681 Unsteadiness on feet: Secondary | ICD-10-CM | POA: Diagnosis not present

## 2021-07-20 DIAGNOSIS — R4701 Aphasia: Secondary | ICD-10-CM | POA: Diagnosis not present

## 2021-07-20 DIAGNOSIS — M6281 Muscle weakness (generalized): Secondary | ICD-10-CM | POA: Diagnosis not present

## 2021-07-20 DIAGNOSIS — R41841 Cognitive communication deficit: Secondary | ICD-10-CM | POA: Diagnosis not present

## 2021-07-20 DIAGNOSIS — R278 Other lack of coordination: Secondary | ICD-10-CM | POA: Diagnosis not present

## 2021-07-20 DIAGNOSIS — R2681 Unsteadiness on feet: Secondary | ICD-10-CM | POA: Diagnosis not present

## 2021-07-22 NOTE — Progress Notes (Signed)
?Subjective:  ?Patient ID: KAMAREE BERKEL, female    DOB: Dec 13, 1921,  MRN: [id number] ? ?[name] presents to clinic today for at risk foot care with h/o clotting disorder and corn(s) b/l feet, callus(es) b/l feetand painful mycotic nails.  Pain interferes with ambulation. Aggravating factors include wearing enclosed shoe gear. Painful toenails interfere with ambulation. Aggravating factors include wearing enclosed shoe gear. Pain is relieved with periodic professional debridement. Painful corns and calluses are aggravated when weightbearing with and without shoegear. Pain is relieved with periodic professional debridement. ? ?She did see Dr. Amalia Hailey for cramping of feet and he prescribed Flexeril She notes no new problems today. ? ?PCP is Harlan Stains, MD , and last visit was May 29, 2021. ? ?Allergies  ?Allergen Reactions  ? Corticosteroids Other (See Comments)  ?  Caused patient to have low sodium levels  ? Adhesive [Tape] Itching  ? Chlorthalidone Other (See Comments)  ?  creatinine  increases ?Other reaction(s): elevated creatinine ?Other reaction(s): elevated creatinine ?Other reaction(s): elevated creatinine ?Other reaction(s): elevated creatinine  ? Denture Adhesive   ?  Other reaction(s): rash  ? Hydrochlorothiazide Other (See Comments)  ?  CREATININE INCREASES ?Other reaction(s): elevated creatinine ?Other reaction(s): elevated creatinine ?Other reaction(s): elevated creatinine ?Other reaction(s): elevated creatinine  ? Methoxsalen   ?  Other reaction(s): cream--rash ?Other reaction(s): cream--rash ?Other reaction(s): cream--rash ?Other reaction(s): cream--rash  ? Other   ?  Other reaction(s): rash ?Other reaction(s): rash ?Other reaction(s): rash  ? Prednisone   ?  Other reaction(s): hyponatremia ?Other reaction(s): hyponatremia ?Other reaction(s): hyponatremia ?Other reaction(s): hyponatremia  ? ? ?Review of Systems: Negative except as noted in the HPI. ? ?Objective: No changes noted in today's  physical examination. ?Constitutional MELEENA MUNROE is a pleasant 88 y.o. Caucasian female, WD, WN in NAD. AAO x 3.   ?Vascular CFT <3 seconds b/l LE. Palpable DP/PT pulses b/l LE. Digital hair absent b/l. Skin temperature gradient WNL b/l. No pain with calf compression b/l. No edema noted b/l. No cyanosis or clubbing noted b/l LE.  ?Neurologic Normal speech. Oriented to person, place, and time. Protective sensation intact 5/5 intact bilaterally with 10g monofilament b/l. Vibratory sensation intact b/l.  ?Dermatologic Pedal integument with normal turgor, texture and tone b/l LE. No open wounds b/l. No interdigital macerations b/l. Toenails 1-5 b/l elongated, thickened, discolored with subungual debris. +Tenderness with dorsal palpation of nailplates. Hyperkeratotic lesion(s) noted bilateral great toes, R 2nd toe, R 3rd toe, and submet head 1 left foot. Preulcerative lesion noted L 2nd toe and L 3rd toe. There is visible subdermal hemorrhage. There is no surrounding erythema, no edema, no drainage, no odor, no fluctuance.  ?Orthopedic: Normal muscle strength 5/5 to all lower extremity muscle groups bilaterally. HAV with bunion deformity noted b/l LE. Severe hammertoe deformity noted 2-5 bilaterally. Crossover hammertoe deformity noted L 2nd toe.Marland Kitchen No pain, crepitus or joint limitation noted with ROM b/l LE.  Patient ambulates independently without assistive aids.  ? ?Radiographs: None ?Assessment/Plan: ?1. Pain due to onychomycosis of toenails of both feet   ?2. Corns and callosities   ?3. Blood clotting disorder ([place])   ?  ?-Examined patient. ?-Patient to continue soft, supportive shoe gear daily. ?-Toenails 1-5 b/l were debrided in length and girth with sterile nail nippers and dremel without iatrogenic bleeding.  ?-Corn(s) right second digit and right third digit and callus(es) bilateral great toes and submet head 1 left foot were pared utilizing sterile scalpel blade without incident. Total number debrided  =  5. ?-Dispensed foam toe separators for 1st webspace b/l. Apply to first webspace bilaterally every morning. Remove every evening. ?-Patient/POA to call should there be question/concern in the interim.  ? ?Return in about 3 months (around 10/15/2021). ? ?Marzetta Board, DPM  ?

## 2021-07-23 DIAGNOSIS — R278 Other lack of coordination: Secondary | ICD-10-CM | POA: Diagnosis not present

## 2021-07-23 DIAGNOSIS — R2681 Unsteadiness on feet: Secondary | ICD-10-CM | POA: Diagnosis not present

## 2021-07-23 DIAGNOSIS — M6281 Muscle weakness (generalized): Secondary | ICD-10-CM | POA: Diagnosis not present

## 2021-07-23 DIAGNOSIS — R41841 Cognitive communication deficit: Secondary | ICD-10-CM | POA: Diagnosis not present

## 2021-07-23 DIAGNOSIS — R4701 Aphasia: Secondary | ICD-10-CM | POA: Diagnosis not present

## 2021-07-27 DIAGNOSIS — Z20822 Contact with and (suspected) exposure to covid-19: Secondary | ICD-10-CM | POA: Diagnosis not present

## 2021-07-29 DIAGNOSIS — K219 Gastro-esophageal reflux disease without esophagitis: Secondary | ICD-10-CM | POA: Diagnosis not present

## 2021-07-29 DIAGNOSIS — G2581 Restless legs syndrome: Secondary | ICD-10-CM | POA: Diagnosis not present

## 2021-07-29 DIAGNOSIS — E785 Hyperlipidemia, unspecified: Secondary | ICD-10-CM | POA: Diagnosis not present

## 2021-07-29 DIAGNOSIS — Z8673 Personal history of transient ischemic attack (TIA), and cerebral infarction without residual deficits: Secondary | ICD-10-CM | POA: Diagnosis not present

## 2021-07-29 DIAGNOSIS — F331 Major depressive disorder, recurrent, moderate: Secondary | ICD-10-CM | POA: Diagnosis not present

## 2021-07-29 DIAGNOSIS — Z0000 Encounter for general adult medical examination without abnormal findings: Secondary | ICD-10-CM | POA: Diagnosis not present

## 2021-07-29 DIAGNOSIS — R413 Other amnesia: Secondary | ICD-10-CM | POA: Diagnosis not present

## 2021-07-29 DIAGNOSIS — E559 Vitamin D deficiency, unspecified: Secondary | ICD-10-CM | POA: Diagnosis not present

## 2021-07-29 DIAGNOSIS — I700 Atherosclerosis of aorta: Secondary | ICD-10-CM | POA: Diagnosis not present

## 2021-07-29 DIAGNOSIS — I10 Essential (primary) hypertension: Secondary | ICD-10-CM | POA: Diagnosis not present

## 2021-07-29 DIAGNOSIS — M810 Age-related osteoporosis without current pathological fracture: Secondary | ICD-10-CM | POA: Diagnosis not present

## 2021-07-29 DIAGNOSIS — E871 Hypo-osmolality and hyponatremia: Secondary | ICD-10-CM | POA: Diagnosis not present

## 2021-08-03 DIAGNOSIS — Z20822 Contact with and (suspected) exposure to covid-19: Secondary | ICD-10-CM | POA: Diagnosis not present

## 2021-08-04 DIAGNOSIS — M6281 Muscle weakness (generalized): Secondary | ICD-10-CM | POA: Diagnosis not present

## 2021-08-04 DIAGNOSIS — R2681 Unsteadiness on feet: Secondary | ICD-10-CM | POA: Diagnosis not present

## 2021-08-04 DIAGNOSIS — R41841 Cognitive communication deficit: Secondary | ICD-10-CM | POA: Diagnosis not present

## 2021-08-04 DIAGNOSIS — R4701 Aphasia: Secondary | ICD-10-CM | POA: Diagnosis not present

## 2021-08-04 DIAGNOSIS — R278 Other lack of coordination: Secondary | ICD-10-CM | POA: Diagnosis not present

## 2021-08-10 DIAGNOSIS — Z20822 Contact with and (suspected) exposure to covid-19: Secondary | ICD-10-CM | POA: Diagnosis not present

## 2021-08-11 ENCOUNTER — Telehealth: Payer: Self-pay | Admitting: *Deleted

## 2021-08-11 NOTE — Telephone Encounter (Signed)
Patient is calling to let the physician know that she will not be able to see her for the next  couple of months and her foot problems have worsened. She would like her to suggest someone else she would recommend for a sooner appointment. ?

## 2021-08-11 NOTE — Telephone Encounter (Signed)
Hi Romie Minus, can you schedule this patient in for a sooner appointment w/ one of these physicians, thanks

## 2021-08-17 DIAGNOSIS — Z20822 Contact with and (suspected) exposure to covid-19: Secondary | ICD-10-CM | POA: Diagnosis not present

## 2021-08-24 DIAGNOSIS — Z20822 Contact with and (suspected) exposure to covid-19: Secondary | ICD-10-CM | POA: Diagnosis not present

## 2021-08-31 ENCOUNTER — Ambulatory Visit (INDEPENDENT_AMBULATORY_CARE_PROVIDER_SITE_OTHER): Payer: Medicare Other | Admitting: Podiatry

## 2021-08-31 DIAGNOSIS — M2041 Other hammer toe(s) (acquired), right foot: Secondary | ICD-10-CM | POA: Diagnosis not present

## 2021-08-31 DIAGNOSIS — M21611 Bunion of right foot: Secondary | ICD-10-CM | POA: Diagnosis not present

## 2021-08-31 DIAGNOSIS — M2012 Hallux valgus (acquired), left foot: Secondary | ICD-10-CM | POA: Diagnosis not present

## 2021-08-31 DIAGNOSIS — M2042 Other hammer toe(s) (acquired), left foot: Secondary | ICD-10-CM

## 2021-08-31 DIAGNOSIS — Z20822 Contact with and (suspected) exposure to covid-19: Secondary | ICD-10-CM | POA: Diagnosis not present

## 2021-08-31 DIAGNOSIS — M21612 Bunion of left foot: Secondary | ICD-10-CM | POA: Diagnosis not present

## 2021-08-31 DIAGNOSIS — M2011 Hallux valgus (acquired), right foot: Secondary | ICD-10-CM

## 2021-08-31 NOTE — Patient Instructions (Signed)
I think you need a wider shoe. I usually recommend the [employer] on [address]. We can also sell you a pair of diabetic shoes which may help. The brand of shoes I wear are called [employer] shoes  ?

## 2021-09-01 NOTE — Progress Notes (Signed)
?  Subjective:  ?Patient ID: Cassidy Ellison, female    DOB: 08-12-1921,  MRN: [id number] ? ?Chief Complaint  ?Patient presents with  ? Callouses  ?   foot problems have gotten worse  ? Nail Problem  ? ? ?88 y.o. female presents with the above complaint. History confirmed with patient.  She presents today with painful multiple calluses on both feet and thickened elongated nails.  She has been wearing the toe spacers that Dr. Elisha Ponder has given her.  Says it continues to hurt more and more. ? ?Objective:  ?Physical Exam: ?warm, good capillary refill, no trophic changes or ulcerative lesions, normal DP and PT pulses, normal sensory exam, and bilaterally she has severe hallux valgus deformity and rigid hammertoe deformities the worst of which is the second there are multiple calluses on the tips of multiple toes the medial first MTPJ and the lateral hallux. ? ?Assessment:  ? ?1. Hallux valgus with bunions, left   ?2. Hallux valgus with bunions, right   ?3. Hammertoe of left foot   ?4. Hammertoe of right foot   ? ? ? ?Plan:  ?Patient was evaluated and treated and all questions answered. ? ?I discussed with her the etiology of the painful lesions as well as of the deformed nails and how the hammertoe in 20 deformities contribute to this.  Unfortunately at her age I do not think surgical correction would be in her best interest.  She would be high risk candidate for surgery and I think palliative care of these lesions is best.  I discussed offloading with her and shoe gear.  I dispensed different silicone pads and metatarsal pads today to see if this alleviates her pain as well.  We also discussed the option of having an extra-depth diabetic shoe with a multidensity insole made for her even though she is not diabetic which she understands she would have to pay out-of-pocket for.  She will consider this and let us know if she is interested in pursuing this.  I will see her back as needed I recommended she continue routine  regular debridements I discussed with her we could see her on a more regular basis shorter than the 58-month interval but she likely would have to pay out-of-pocket for every other 1 as Medicare would not cover debridements every 6 to 7 weeks ? ?Return if symptoms worsen or fail to improve.  ? ?

## 2021-09-07 DIAGNOSIS — Z20822 Contact with and (suspected) exposure to covid-19: Secondary | ICD-10-CM | POA: Diagnosis not present

## 2021-09-14 DIAGNOSIS — Z20822 Contact with and (suspected) exposure to covid-19: Secondary | ICD-10-CM | POA: Diagnosis not present

## 2021-10-19 ENCOUNTER — Ambulatory Visit (INDEPENDENT_AMBULATORY_CARE_PROVIDER_SITE_OTHER): Payer: Medicare Other | Admitting: Podiatry

## 2021-10-19 ENCOUNTER — Encounter: Payer: Self-pay | Admitting: Podiatry

## 2021-10-19 DIAGNOSIS — L84 Corns and callosities: Secondary | ICD-10-CM | POA: Diagnosis not present

## 2021-10-19 DIAGNOSIS — M79675 Pain in left toe(s): Secondary | ICD-10-CM

## 2021-10-19 DIAGNOSIS — M2041 Other hammer toe(s) (acquired), right foot: Secondary | ICD-10-CM | POA: Diagnosis not present

## 2021-10-19 DIAGNOSIS — M79674 Pain in right toe(s): Secondary | ICD-10-CM

## 2021-10-19 DIAGNOSIS — D689 Coagulation defect, unspecified: Secondary | ICD-10-CM | POA: Diagnosis not present

## 2021-10-19 DIAGNOSIS — M2042 Other hammer toe(s) (acquired), left foot: Secondary | ICD-10-CM | POA: Diagnosis not present

## 2021-10-19 DIAGNOSIS — B351 Tinea unguium: Secondary | ICD-10-CM | POA: Diagnosis not present

## 2021-10-19 NOTE — Patient Instructions (Signed)
Cassidy Ellison with stretchable uppers and heel strap. Can be purchased at [employer]

## 2021-10-24 NOTE — Progress Notes (Signed)
Subjective:  Patient ID: DWAYNE BEGAY, female    DOB: 1922/05/10,  MRN: [id number]  Cassidy Ellison presents to clinic today for at risk foot care with h/o clotting disorder and corn(s) both feet, callus(es) b/l lower extremities and painful mycotic nails.  Pain interferes with ambulation. Aggravating factors include wearing enclosed shoe gear. Painful toenails interfere with ambulation. Aggravating factors include wearing enclosed shoe gear. Pain is relieved with periodic professional debridement. Painful corns and calluses are aggravated when weightbearing with and without shoegear. Pain is relieved with periodic professional debridement.  Patient states she did see Dr. Sherryle Lis who recommended continuing routine foot care and extra depth shoes. She would have to pay out of pocket for these shoes because she is not diabetic.   New problem(s): None.   PCP is Harlan Stains, MD , and last visit was July 06, 2021.  Allergies  Allergen Reactions   Corticosteroids Other (See Comments)    Caused patient to have low sodium levels   Adhesive [Tape] Itching   Chlorthalidone Other (See Comments)    creatinine  increases Other reaction(s): elevated creatinine Other reaction(s): elevated creatinine Other reaction(s): elevated creatinine Other reaction(s): elevated creatinine   Denture Adhesive     Other reaction(s): rash   Hydrochlorothiazide Other (See Comments)    CREATININE INCREASES Other reaction(s): elevated creatinine Other reaction(s): elevated creatinine Other reaction(s): elevated creatinine Other reaction(s): elevated creatinine   Methoxsalen     Other reaction(s): cream--rash Other reaction(s): cream--rash Other reaction(s): cream--rash Other reaction(s): cream--rash   Other     Other reaction(s): rash Other reaction(s): rash Other reaction(s): rash   Prednisone     Other reaction(s): hyponatremia Other reaction(s): hyponatremia Other reaction(s): hyponatremia Other  reaction(s): hyponatremia    Review of Systems: Negative except as noted in the HPI.  Objective:  Constitutional Cassidy Ellison is a pleasant 88 y.o. Caucasian female, WD, WN in NAD. AAO x 3.   Vascular CFT <3 seconds b/l LE. Palpable DP/PT pulses b/l LE. Digital hair absent b/l. Skin temperature gradient WNL b/l. No pain with calf compression b/l. No edema noted b/l. No cyanosis or clubbing noted b/l LE.  Neurologic Normal speech. Oriented to person, place, and time. Protective sensation intact 5/5 intact bilaterally with 10g monofilament b/l. Vibratory sensation intact b/l.  Dermatologic Pedal integument with normal turgor, texture and tone b/l LE. No open wounds b/l. No interdigital macerations b/l. Toenails 1-5 b/l elongated, thickened, discolored with subungual debris. +Tenderness with dorsal palpation of nailplates. Hyperkeratotic lesion(s) noted bilateral great toes and submet head 1 left foot. Preulcerative lesion noted distal tip L 2nd toe. There is visible subdermal hemorrhage. There is no surrounding erythema, no edema, no drainage, no odor, no fluctuance.  Orthopedic: Normal muscle strength 5/5 to all lower extremity muscle groups bilaterally. HAV with bunion deformity noted b/l LE. Severe hammertoe deformity noted 2-5 bilaterally. Crossover hammertoe deformity noted L 2nd toe.Marland Kitchen No pain, crepitus or joint limitation noted with ROM b/l LE.  Patient ambulates independently without assistive aids.   Radiographs: None  Assessment/Plan: 1. Pain due to onychomycosis of toenails of both feet   2. Callus   3. Pre-ulcerative corn or callous   4. Blood clotting disorder ([place])   5. Acquired hammertoes of both feet   6. Pain in toes of both feet     -Patient was evaluated and treated. All patient's and/or POA's questions/concerns answered on today's visit. -Discussed shoe gear. I recommended she purchase Transport [employer] at [employer]. -Mycotic toenails  1-5 bilaterally were debrided in length and  girth with sterile nail nippers and dremel without incident. -Callus(es) bilateral great toes pared utilizing sterile scalpel blade without complication or incident. Total number debrided =3. -Preulcerative lesion pared L 2nd toe. Total number pared=1. -Continue padding to afftected digits daily for protection. -Patient/POA to call should there be question/concern in the interim.   Return in about 9 weeks (around 12/21/2021).  Marzetta Board, DPM

## 2021-11-18 DIAGNOSIS — H18593 Other hereditary corneal dystrophies, bilateral: Secondary | ICD-10-CM | POA: Diagnosis not present

## 2021-11-18 DIAGNOSIS — H524 Presbyopia: Secondary | ICD-10-CM | POA: Diagnosis not present

## 2021-11-18 DIAGNOSIS — H04123 Dry eye syndrome of bilateral lacrimal glands: Secondary | ICD-10-CM | POA: Diagnosis not present

## 2021-11-18 DIAGNOSIS — H35033 Hypertensive retinopathy, bilateral: Secondary | ICD-10-CM | POA: Diagnosis not present

## 2021-12-23 ENCOUNTER — Ambulatory Visit (INDEPENDENT_AMBULATORY_CARE_PROVIDER_SITE_OTHER): Payer: Medicare Other | Admitting: Podiatry

## 2021-12-23 ENCOUNTER — Encounter: Payer: Self-pay | Admitting: Podiatry

## 2021-12-23 DIAGNOSIS — D689 Coagulation defect, unspecified: Secondary | ICD-10-CM

## 2021-12-23 DIAGNOSIS — B351 Tinea unguium: Secondary | ICD-10-CM | POA: Diagnosis not present

## 2021-12-23 DIAGNOSIS — M79675 Pain in left toe(s): Secondary | ICD-10-CM

## 2021-12-23 DIAGNOSIS — M79674 Pain in right toe(s): Secondary | ICD-10-CM | POA: Diagnosis not present

## 2021-12-23 DIAGNOSIS — L84 Corns and callosities: Secondary | ICD-10-CM | POA: Diagnosis not present

## 2021-12-27 NOTE — Progress Notes (Unsigned)
  Subjective:  Patient ID: Cassidy Ellison, female    DOB: Aug 12, 1921,  MRN: [id number]  Cassidy Ellison presents to clinic today for at risk foot care with h/o clotting disorder and corn(s) b/l lower extremities, callus(es) b/l lower extremities and painful mycotic nails.  Pain interferes with ambulation. Aggravating factors include wearing enclosed shoe gear. Painful toenails interfere with ambulation. Aggravating factors include wearing enclosed shoe gear. Pain is relieved with periodic professional debridement. Painful corns and calluses are aggravated when weightbearing with and without shoegear. Pain is relieved with periodic professional debridement.  New problem(s): None.   PCP is Cassidy Stains, MD , and last visit was  July 06, 2021  Allergies  Allergen Reactions   Corticosteroids Other (See Comments)    Caused patient to have low sodium levels   Adhesive [Tape] Itching   Chlorthalidone Other (See Comments)    creatinine  increases Other reaction(s): elevated creatinine Other reaction(s): elevated creatinine Other reaction(s): elevated creatinine Other reaction(s): elevated creatinine   Denture Adhesive     Other reaction(s): rash   Hydrochlorothiazide Other (See Comments)    CREATININE INCREASES Other reaction(s): elevated creatinine Other reaction(s): elevated creatinine Other reaction(s): elevated creatinine Other reaction(s): elevated creatinine   Methoxsalen     Other reaction(s): cream--rash Other reaction(s): cream--rash Other reaction(s): cream--rash Other reaction(s): cream--rash   Other     Other reaction(s): rash Other reaction(s): rash Other reaction(s): rash   Prednisone     Other reaction(s): hyponatremia Other reaction(s): hyponatremia Other reaction(s): hyponatremia Other reaction(s): hyponatremia    Review of Systems: Negative except as noted in the HPI.  Objective: No changes noted in today's physical examination.  Vascular  Examination: Vascular status intact b/l with palpable pedal pulses. Pedal hair present b/l. CFT <3seconds b/l. No edema. No pain with calf compression b/l. Skin temperature gradient WNL b/l.   Neurological Examination: Sensation grossly intact b/l with 10 gram monofilament. Vibratory sensation intact b/l.   Dermatological Examination: Pedal skin with normal turgor, texture and tone b/l. Toenails 1-5 b/l thick, discolored, elongated with subungual debris and pain on dorsal palpation.  Hyperkeratotic lesion(s) {jgPodToeLocator:23637}.  No erythema, no edema, no drainage, no fluctuance.  Musculoskeletal Examination: Muscle strength 5/5 to b/l LE. {jgmsk:23600}  Radiographs: None  {jgxrayfindings:23683}  Last A1c:       No data to display          Assessment/Plan: No diagnosis found.   {Jgplan:23602::"-Patient/POA to call should there be question/concern in the interim."}   Return in about 9 weeks (around 02/24/2022).  Marzetta Board, DPM

## 2022-02-04 DIAGNOSIS — Z23 Encounter for immunization: Secondary | ICD-10-CM | POA: Diagnosis not present

## 2022-02-19 DIAGNOSIS — Z23 Encounter for immunization: Secondary | ICD-10-CM | POA: Diagnosis not present

## 2022-02-24 ENCOUNTER — Ambulatory Visit (INDEPENDENT_AMBULATORY_CARE_PROVIDER_SITE_OTHER): Payer: Medicare Other | Admitting: Podiatry

## 2022-02-24 DIAGNOSIS — M79674 Pain in right toe(s): Secondary | ICD-10-CM

## 2022-02-24 DIAGNOSIS — L84 Corns and callosities: Secondary | ICD-10-CM | POA: Diagnosis not present

## 2022-02-24 DIAGNOSIS — M79675 Pain in left toe(s): Secondary | ICD-10-CM

## 2022-02-24 DIAGNOSIS — D689 Coagulation defect, unspecified: Secondary | ICD-10-CM | POA: Diagnosis not present

## 2022-02-24 DIAGNOSIS — B351 Tinea unguium: Secondary | ICD-10-CM | POA: Diagnosis not present

## 2022-02-24 NOTE — Progress Notes (Signed)
  Subjective:  Patient ID: Cassidy Ellison, female    DOB: 07-25-1921,  MRN: [id number]  Cassidy Ellison presents to clinic today for:  Chief Complaint  Patient presents with   Nail Problem    Routine foot care PCP-White, Caren Griffins PCP VST-1 year ago   New problem(s): None.   PCP is Harlan Stains, MD , and last visit was  July 06, 2021.  Allergies  Allergen Reactions   Corticosteroids Other (See Comments)    Caused patient to have low sodium levels   Adhesive [Tape] Itching   Chlorthalidone Other (See Comments)    creatinine  increases Other reaction(s): elevated creatinine Other reaction(s): elevated creatinine Other reaction(s): elevated creatinine Other reaction(s): elevated creatinine   Denture Adhesive     Other reaction(s): rash   Hydrochlorothiazide Other (See Comments)    CREATININE INCREASES Other reaction(s): elevated creatinine Other reaction(s): elevated creatinine Other reaction(s): elevated creatinine Other reaction(s): elevated creatinine   Methoxsalen     Other reaction(s): cream--rash Other reaction(s): cream--rash Other reaction(s): cream--rash Other reaction(s): cream--rash   Other     Other reaction(s): rash Other reaction(s): rash Other reaction(s): rash   Prednisone     Other reaction(s): hyponatremia Other reaction(s): hyponatremia Other reaction(s): hyponatremia Other reaction(s): hyponatremia    Review of Systems: Negative except as noted in the HPI.  Objective:   Cassidy Ellison is a pleasant 88 y.o. female WD, WN in NAD. AAO x 3.  Vascular Examination: Vascular status intact b/l with palpable pedal pulses. Pedal hair present b/l. CFT <3seconds b/l. No edema. No pain with calf compression b/l. Skin temperature gradient WNL b/l.   Neurological Examination: Sensation grossly intact b/l with 10 gram monofilament. Vibratory sensation intact b/l.   Dermatological Examination: Pedal skin with normal turgor, texture and tone b/l. Toenails 1-5  b/l thick, discolored, elongated with subungual debris and pain on dorsal palpation.    Hyperkeratotic lesion(s) left 2nd toe,  medial IPJ of b/l great toes, and 1st metatarsal head b/l.  No erythema, no edema, no drainage, no fluctuance.   Preulcerative lesion noted distal tip of left 2nd toe. There is visible subdermal hemorrhage. There is no surrounding erythema, no edema, no drainage, no odor, no fluctuance.  Musculoskeletal Examination: Muscle strength 5/5 to b/l LE. HAV with bunion deformity noted b/l LE. Severe hammertoe deformity noted 3-5 bilaterally. Crossover hammertoe deformity noted bilateral 2nd toes. Patient ambulates with assistance of rollator.  Radiographs: None  Assessment/Plan: 1. Pain due to onychomycosis of toenails of both feet   2. Callus   3. Pre-ulcerative corn or callous   4. Blood clotting disorder (HCC)     No orders of the defined types were placed in this encounter.   -Patient was evaluated and treated. All patient's and/or POA's questions/concerns answered on today's visit. -No new findings. No new orders. -Patient to continue soft, supportive shoe gear daily. -Mycotic toenails 1-5 bilaterally were debrided in length and girth with sterile nail nippers and dremel without incident. -Callus(es) bilateral great toes and 1st metatarsal head b/l lower extremities pared utilizing rotary bur without complication or incident. Total number pared =4. -Preulcerative lesion pared L 2nd toe utilizing sterile scalpel blade. Total number pared=1. -Patient/POA to call should there be question/concern in the interim.   Return in about 3 months (around 05/27/2022).  Marzetta Board, DPM

## 2022-02-27 ENCOUNTER — Encounter: Payer: Self-pay | Admitting: Podiatry

## 2022-03-03 DIAGNOSIS — R296 Repeated falls: Secondary | ICD-10-CM | POA: Diagnosis not present

## 2022-03-03 DIAGNOSIS — R413 Other amnesia: Secondary | ICD-10-CM | POA: Diagnosis not present

## 2022-03-03 DIAGNOSIS — G2581 Restless legs syndrome: Secondary | ICD-10-CM | POA: Diagnosis not present

## 2022-03-03 DIAGNOSIS — F331 Major depressive disorder, recurrent, moderate: Secondary | ICD-10-CM | POA: Diagnosis not present

## 2022-03-03 DIAGNOSIS — M79672 Pain in left foot: Secondary | ICD-10-CM | POA: Diagnosis not present

## 2022-03-03 DIAGNOSIS — I700 Atherosclerosis of aorta: Secondary | ICD-10-CM | POA: Diagnosis not present

## 2022-03-03 DIAGNOSIS — E785 Hyperlipidemia, unspecified: Secondary | ICD-10-CM | POA: Diagnosis not present

## 2022-03-03 DIAGNOSIS — J9801 Acute bronchospasm: Secondary | ICD-10-CM | POA: Diagnosis not present

## 2022-03-03 DIAGNOSIS — Z8673 Personal history of transient ischemic attack (TIA), and cerebral infarction without residual deficits: Secondary | ICD-10-CM | POA: Diagnosis not present

## 2022-03-03 DIAGNOSIS — I10 Essential (primary) hypertension: Secondary | ICD-10-CM | POA: Diagnosis not present

## 2022-03-03 DIAGNOSIS — M79671 Pain in right foot: Secondary | ICD-10-CM | POA: Diagnosis not present

## 2022-03-03 DIAGNOSIS — M810 Age-related osteoporosis without current pathological fracture: Secondary | ICD-10-CM | POA: Diagnosis not present

## 2022-03-15 DIAGNOSIS — R3915 Urgency of urination: Secondary | ICD-10-CM | POA: Diagnosis not present

## 2022-03-15 DIAGNOSIS — I10 Essential (primary) hypertension: Secondary | ICD-10-CM | POA: Diagnosis not present

## 2022-05-12 ENCOUNTER — Encounter: Payer: Self-pay | Admitting: Podiatry

## 2022-05-12 ENCOUNTER — Ambulatory Visit (INDEPENDENT_AMBULATORY_CARE_PROVIDER_SITE_OTHER): Payer: Medicare Other | Admitting: Podiatry

## 2022-05-12 VITALS — BP 174/85

## 2022-05-12 DIAGNOSIS — M79675 Pain in left toe(s): Secondary | ICD-10-CM | POA: Diagnosis not present

## 2022-05-12 DIAGNOSIS — L84 Corns and callosities: Secondary | ICD-10-CM

## 2022-05-12 DIAGNOSIS — D689 Coagulation defect, unspecified: Secondary | ICD-10-CM

## 2022-05-12 DIAGNOSIS — B351 Tinea unguium: Secondary | ICD-10-CM | POA: Diagnosis not present

## 2022-05-12 DIAGNOSIS — M79674 Pain in right toe(s): Secondary | ICD-10-CM

## 2022-05-12 NOTE — Patient Instructions (Addendum)
BPs: 178/85 and 174/85. Check with your PCP regarding blood pressure.  [employer] with knit uppers to stretch over hammertoes.  Website: [email or web address]

## 2022-05-12 NOTE — Progress Notes (Signed)
Subjective:  Patient ID: Cassidy Ellison, female    DOB: Oct 29, 1921,  MRN: [id number]  [name] presents to clinic today for at risk foot care with h/o clotting disorder and preulcerative lesion(s) b/l lower extremities and painful mycotic toenails that limit ambulation. Painful toenails interfere with ambulation. Aggravating factors include wearing enclosed shoe gear. Pain is relieved with periodic professional debridement. Painful porokeratotic lesions are aggravated when weightbearing with and without shoegear. Pain is relieved with periodic professional debridement.  Chief Complaint  Patient presents with   Nail Problem    RFC PCP-Cynthia White PCP VST-6 months ago    New problem(s): None.   PCP is Harlan Stains, MD.  Allergies  Allergen Reactions   Corticosteroids Other (See Comments)    Caused patient to have low sodium levels   Adhesive [Tape] Itching   Chlorthalidone Other (See Comments)    creatinine  increases Other reaction(s): elevated creatinine Other reaction(s): elevated creatinine Other reaction(s): elevated creatinine Other reaction(s): elevated creatinine   Denture Adhesive     Other reaction(s): rash   Hydrochlorothiazide Other (See Comments)    CREATININE INCREASES Other reaction(s): elevated creatinine Other reaction(s): elevated creatinine Other reaction(s): elevated creatinine Other reaction(s): elevated creatinine   Methoxsalen     Other reaction(s): cream--rash Other reaction(s): cream--rash Other reaction(s): cream--rash Other reaction(s): cream--rash   Other     Other reaction(s): rash Other reaction(s): rash Other reaction(s): rash   Prednisone     Other reaction(s): hyponatremia Other reaction(s): hyponatremia Other reaction(s): hyponatremia Other reaction(s): hyponatremia    Review of Systems: Negative except as noted in the HPI.  Objective: No changes noted in today's physical examination. Vitals:   05/12/22 0953 05/12/22 1004   BP: (!) 178/85 (!) 174/85  Patient denies any dizziness, lightheadedness, blurred vision or headaches.  Cassidy Ellison is a pleasant 89 y.o. female WD, WN in NAD. AAO x 3.  Vascular Examination: Vascular status intact b/l with palpable pedal pulses. Pedal hair present b/l. CFT <3seconds b/l. No edema. No pain with calf compression b/l. Skin temperature gradient WNL b/l.   Neurological Examination: Sensation grossly intact b/l with 10 gram monofilament. Vibratory sensation intact b/l.   Dermatological Examination: Pedal skin with normal turgor, texture and tone b/l. Toenails 1-5 b/l thick, discolored, elongated with subungual debris and pain on dorsal palpation.    Hyperkeratotic lesion(s) left 2nd toe,  medial IPJ of b/l great toes, and 1st metatarsal head b/l.  No erythema, no edema, no drainage, no fluctuance.   Preulcerative lesion noted distal tip of left 2nd toe. There is visible subdermal hemorrhage. There is no surrounding erythema, no edema, no drainage, no odor, no fluctuance.  Musculoskeletal Examination: Muscle strength 5/5 to b/l LE. HAV with bunion deformity noted b/l LE. Severe hammertoe deformity noted 3-5 bilaterally. Crossover hammertoe deformity noted bilateral 2nd toes. Patient ambulates with assistance of rollator.  Radiographs: None  Assessment/Plan: 1. Pain due to onychomycosis of toenails of both feet   2. Callus   3. Pre-ulcerative corn or callous   4. Blood clotting disorder (HCC)     No orders of the defined types were placed in this encounter.   -Consent given for treatment as described below: -Examined patient. -Discussed elevated blood pressure reading with patient. Patient is asymptomatic on today's visit. Patient advised to discuss blood pressure with PCP. Patient/Family/Caregiver/POA related understanding. -Toenails 1-5 b/l were debrided in length and girth with sterile nail nippers and dremel without iatrogenic bleeding.  -Callus(es) L hallux, R  hallux, and 1st metatarsal head b/l lower extremities pared utilizing sterile scalpel blade without complication or incident. Total number debrided =4. -Preulcerative lesion pared left second digit utilizing sterile scalpel blade. Total number pared=1. -Patient/POA to call should there be question/concern in the interim.   Return in about 3 months (around 08/11/2022).  Marzetta Board, DPM

## 2022-07-26 ENCOUNTER — Telehealth: Payer: Self-pay | Admitting: *Deleted

## 2022-07-26 NOTE — Patient Outreach (Signed)
  Care Coordination   Initial Visit Note   07/26/2022 Name: Cassidy Ellison MRN: [id number] DOB: [birth year]  Cassidy Ellison is a 89 y.o. year old female who sees Harlan Stains, MD for primary care. I spoke with  [name] by phone today.  What matters to the patients health and wellness today?  Pt residing in [place] at [facility] and is "very well cared for". Pt denies need for Care Coordination services at this time.    Goals Addressed   None     SDOH assessments and interventions completed:  Yes  SDOH Interventions Today    Flowsheet Row Most Recent Value  SDOH Interventions   Food Insecurity Interventions Intervention Not Indicated  Housing Interventions Intervention Not Indicated  Transportation Interventions Intervention Not Indicated  Financial Strain Interventions Intervention Not Indicated        Care Coordination Interventions:  Yes, provided   Follow up plan: No further intervention required.   Encounter Outcome:  Pt. Visit Completed

## 2022-08-18 ENCOUNTER — Encounter: Payer: Self-pay | Admitting: Podiatry

## 2022-08-18 ENCOUNTER — Ambulatory Visit (INDEPENDENT_AMBULATORY_CARE_PROVIDER_SITE_OTHER): Payer: Medicare Other | Admitting: Podiatry

## 2022-08-18 DIAGNOSIS — D689 Coagulation defect, unspecified: Secondary | ICD-10-CM

## 2022-08-18 DIAGNOSIS — B351 Tinea unguium: Secondary | ICD-10-CM | POA: Diagnosis not present

## 2022-08-18 DIAGNOSIS — M79674 Pain in right toe(s): Secondary | ICD-10-CM | POA: Diagnosis not present

## 2022-08-18 DIAGNOSIS — M79675 Pain in left toe(s): Secondary | ICD-10-CM | POA: Diagnosis not present

## 2022-08-18 DIAGNOSIS — L84 Corns and callosities: Secondary | ICD-10-CM | POA: Diagnosis not present

## 2022-08-18 NOTE — Progress Notes (Signed)
  Subjective:  Patient ID: Cassidy Ellison, female    DOB: October 14, 1921,  MRN: [id number]  Cassidy Ellison presents to clinic today for at risk foot care with h/o clotting disorder and preulcerative lesion(s) left lower extremity. Pain prevent(s) comfortable ambulation. Aggravating factor is weightbearing with and without shoegear.  Chief Complaint  Patient presents with   NAIL CARE    RFC    Callouses    CALLUS BILAT    New problem(s): None.   PCP is Laurann Montana, MD.  Allergies  Allergen Reactions   Corticosteroids Other (See Comments)    Caused patient to have low sodium levels   Chlorthalidone Other (See Comments)    creatinine  increases Other reaction(s): elevated creatinine Other reaction(s): elevated creatinine Other reaction(s): elevated creatinine Other reaction(s): elevated creatinine   Denture Adhesive     Other reaction(s): rash   Hydrochlorothiazide Other (See Comments)    CREATININE INCREASES Other reaction(s): elevated creatinine Other reaction(s): elevated creatinine Other reaction(s): elevated creatinine Other reaction(s): elevated creatinine   Methoxsalen     Other reaction(s): cream--rash Other reaction(s): cream--rash Other reaction(s): cream--rash Other reaction(s): cream--rash   Other     Other reaction(s): rash Other reaction(s): rash Other reaction(s): rash   Prednisone     Other reaction(s): hyponatremia Other reaction(s): hyponatremia Other reaction(s): hyponatremia Other reaction(s): hyponatremia   Tape Itching    Review of Systems: Negative except as noted in the HPI.  Objective: No changes noted in today's physical examination. There were no vitals filed for this visit. Cassidy Ellison is a pleasant 89 y.o. female WD, WN in NAD. AAO x 3. Vascular Examination: Vascular status intact b/l with palpable pedal pulses. Pedal hair present b/l. CFT <3seconds b/l. No edema. No pain with calf compression b/l. Skin temperature gradient WNL b/l.    Neurological Examination: Sensation grossly intact b/l with 10 gram monofilament. Vibratory sensation intact b/l.   Dermatological Examination: Pedal skin with normal turgor, texture and tone b/l. Toenails 1-5 b/l thick, discolored, elongated with subungual debris and pain on dorsal palpation.    Hyperkeratotic lesion(s) right 3rd toe, left great toe and submet head 1 left  No erythema, no edema, no drainage, no fluctuance.   Preulcerative lesion noted distal tip of left 2nd toe. There is visible subdermal hemorrhage. There is no surrounding erythema, no edema, no drainage, no odor, no fluctuance.  Musculoskeletal Examination: Muscle strength 5/5 to b/l LE. HAV with bunion deformity noted b/l LE. Severe hammertoe deformity noted 3-5 bilaterally. Crossover hammertoe deformity noted bilateral 2nd toes. Patient ambulates with assistance of rollator.  Radiographs: None  Assessment/Plan: 1. Pain due to onychomycosis of toenails of both feet   2. Callus   3. Pre-ulcerative corn or callous   4. Blood clotting disorder     -Patient was evaluated and treated. All patient's and/or POA's questions/concerns answered on today's visit. -Mycotic toenails 1-5 bilaterally were debrided in length and girth with sterile nail nippers and dremel without incident. -Callus(es) bilateral 3rd toes and submet head 1 left foot pared utilizing sharp debridement with sterile blade without complication or incident. Total number debrided =3. -Preulcerative lesion pared L 2nd toe utilizing sterile scalpel blade. Total number pared=1. -Patient/POA to call should there be question/concern in the interim.   Return in about 3 months (around 11/17/2022).  Freddie Breech, DPM

## 2022-09-08 DIAGNOSIS — K219 Gastro-esophageal reflux disease without esophagitis: Secondary | ICD-10-CM | POA: Diagnosis not present

## 2022-09-08 DIAGNOSIS — R413 Other amnesia: Secondary | ICD-10-CM | POA: Diagnosis not present

## 2022-09-08 DIAGNOSIS — G2581 Restless legs syndrome: Secondary | ICD-10-CM | POA: Diagnosis not present

## 2022-09-08 DIAGNOSIS — E785 Hyperlipidemia, unspecified: Secondary | ICD-10-CM | POA: Diagnosis not present

## 2022-09-08 DIAGNOSIS — I129 Hypertensive chronic kidney disease with stage 1 through stage 4 chronic kidney disease, or unspecified chronic kidney disease: Secondary | ICD-10-CM | POA: Diagnosis not present

## 2022-09-08 DIAGNOSIS — I700 Atherosclerosis of aorta: Secondary | ICD-10-CM | POA: Diagnosis not present

## 2022-09-08 DIAGNOSIS — M810 Age-related osteoporosis without current pathological fracture: Secondary | ICD-10-CM | POA: Diagnosis not present

## 2022-09-08 DIAGNOSIS — Z0000 Encounter for general adult medical examination without abnormal findings: Secondary | ICD-10-CM | POA: Diagnosis not present

## 2022-09-08 DIAGNOSIS — F331 Major depressive disorder, recurrent, moderate: Secondary | ICD-10-CM | POA: Diagnosis not present

## 2022-09-08 DIAGNOSIS — Z79899 Other long term (current) drug therapy: Secondary | ICD-10-CM | POA: Diagnosis not present

## 2022-09-08 DIAGNOSIS — N1831 Chronic kidney disease, stage 3a: Secondary | ICD-10-CM | POA: Diagnosis not present

## 2022-09-08 DIAGNOSIS — Z8673 Personal history of transient ischemic attack (TIA), and cerebral infarction without residual deficits: Secondary | ICD-10-CM | POA: Diagnosis not present

## 2022-09-08 DIAGNOSIS — E559 Vitamin D deficiency, unspecified: Secondary | ICD-10-CM | POA: Diagnosis not present

## 2022-11-10 DIAGNOSIS — Z85828 Personal history of other malignant neoplasm of skin: Secondary | ICD-10-CM | POA: Diagnosis not present

## 2022-11-10 DIAGNOSIS — D485 Neoplasm of uncertain behavior of skin: Secondary | ICD-10-CM | POA: Diagnosis not present

## 2022-11-10 DIAGNOSIS — L821 Other seborrheic keratosis: Secondary | ICD-10-CM | POA: Diagnosis not present

## 2022-11-10 DIAGNOSIS — D1801 Hemangioma of skin and subcutaneous tissue: Secondary | ICD-10-CM | POA: Diagnosis not present

## 2022-11-10 DIAGNOSIS — L820 Inflamed seborrheic keratosis: Secondary | ICD-10-CM | POA: Diagnosis not present

## 2022-11-17 ENCOUNTER — Ambulatory Visit (INDEPENDENT_AMBULATORY_CARE_PROVIDER_SITE_OTHER): Payer: Medicare Other | Admitting: Podiatry

## 2022-11-17 VITALS — BP 178/86

## 2022-11-17 DIAGNOSIS — M79674 Pain in right toe(s): Secondary | ICD-10-CM

## 2022-11-17 DIAGNOSIS — M79675 Pain in left toe(s): Secondary | ICD-10-CM | POA: Diagnosis not present

## 2022-11-17 DIAGNOSIS — L84 Corns and callosities: Secondary | ICD-10-CM

## 2022-11-17 DIAGNOSIS — D689 Coagulation defect, unspecified: Secondary | ICD-10-CM

## 2022-11-17 DIAGNOSIS — B351 Tinea unguium: Secondary | ICD-10-CM

## 2022-11-17 NOTE — Progress Notes (Signed)
Subjective:  Patient ID: Cassidy Ellison, female    DOB: [birth year],  MRN: [id number]  Lendon Collar presents to clinic today for at risk foot care with h/o clotting disorder and preulcerative lesion(s) b/l lower extremities and painful mycotic toenails that limit ambulation. Painful toenails interfere with ambulation. Aggravating factors include wearing enclosed shoe gear. Pain is relieved with periodic professional debridement. Painful preulcerative lesion(s) is/are aggravated when weightbearing with and without shoegear. Pain is relieved with periodic professional debridement.  Chief Complaint  Patient presents with   Nail Problem     Routine foot care / callus trim    New problem(s): None.   PCP is Laurann Montana, MD.  Allergies  Allergen Reactions   Corticosteroids Other (See Comments)    Caused patient to have low sodium levels   Chlorthalidone Other (See Comments)    creatinine  increases Other reaction(s): elevated creatinine Other reaction(s): elevated creatinine Other reaction(s): elevated creatinine Other reaction(s): elevated creatinine   Denture Adhesive     Other reaction(s): rash   Hydrochlorothiazide Other (See Comments)    CREATININE INCREASES Other reaction(s): elevated creatinine Other reaction(s): elevated creatinine Other reaction(s): elevated creatinine Other reaction(s): elevated creatinine   Methoxsalen     Other reaction(s): cream--rash Other reaction(s): cream--rash Other reaction(s): cream--rash Other reaction(s): cream--rash   Other     Other reaction(s): rash Other reaction(s): rash Other reaction(s): rash   Prednisone     Other reaction(s): hyponatremia Other reaction(s): hyponatremia Other reaction(s): hyponatremia Other reaction(s): hyponatremia   Tape Itching    Review of Systems: Negative except as noted in the HPI.  Objective: No changes noted in today's physical examination. Vitals:   11/17/22 0919  BP: (!) 178/86   Cassidy Ellison  is a pleasant 89 y.o. female WD, WN in NAD. AAO x 3.  Vascular Examination: Capillary refill time < 3 seconds b/l. Vascular status intact b/l with palpable pedal pulses. Pedal hair absent b/l. No pain with calf compression b/l. Skin temperature gradient WNL b/l. No cyanosis or clubbing b/l. No ischemia or gangrene noted b/l.   Neurological Examination: Sensation grossly intact b/l with 10 gram monofilament. Vibratory sensation intact b/l.   Dermatological Examination: Pedal skin with normal turgor, texture and tone b/l.  No open wounds. No interdigital macerations.   Toenails 1-5 b/l thick, discolored, elongated with subungual debris and pain on dorsal palpation.   Hyperkeratotic lesion(s) bilateral 3rd toes, L hallux, and submet head 1 left foot.  No erythema, no edema, no drainage, no fluctuance. Preulcerative lesion noted L 2nd toe. There is visible subdermal hemorrhage. There is no surrounding erythema, no edema, no drainage, no odor, no fluctuance.  Musculoskeletal Examination: Muscle strength 5/5 to all lower extremity muscle groups bilaterally. Hallux valgus with bunion deformity noted b/l feet. Severe hammertoe deformity noted b/l feet. Utilizes rollator for ambulation assistance.  Radiographs: None  Assessment/Plan: 1. Pain due to onychomycosis of toenails of both feet   2. Pre-ulcerative corn or callous   3. Blood clotting disorder (HCC)   -Consent given for treatment as described below: -Examined patient. -Toenails 1-5 b/l were debrided in length and girth with sterile nail nippers and dremel without iatrogenic bleeding.  -Corn(s) L 3rd toe and R 3rd toe pared utilizing sharp debridement with sterile blade without complication or incident. Total number debrided=2. -Callus(es) L hallux and submet head 1 left foot pared utilizing sharp debridement with sterile blade without complication or incident. Total number debrided =2. -Preulcerative lesion pared left second digit  utilizing sterile scalpel blade. Total number pared=1. -Patient/POA to call should there be question/concern in the interim.   Return in about 3 months (around 02/17/2023).  Freddie Breech, DPM

## 2022-11-21 ENCOUNTER — Encounter: Payer: Self-pay | Admitting: Podiatry

## 2023-01-24 DIAGNOSIS — M2012 Hallux valgus (acquired), left foot: Secondary | ICD-10-CM | POA: Diagnosis not present

## 2023-01-24 DIAGNOSIS — S92302A Fracture of unspecified metatarsal bone(s), left foot, initial encounter for closed fracture: Secondary | ICD-10-CM | POA: Diagnosis not present

## 2023-01-24 DIAGNOSIS — M2042 Other hammer toe(s) (acquired), left foot: Secondary | ICD-10-CM | POA: Diagnosis not present

## 2023-01-24 DIAGNOSIS — S92322A Displaced fracture of second metatarsal bone, left foot, initial encounter for closed fracture: Secondary | ICD-10-CM | POA: Diagnosis not present

## 2023-01-24 DIAGNOSIS — S92902A Unspecified fracture of left foot, initial encounter for closed fracture: Secondary | ICD-10-CM | POA: Diagnosis not present

## 2023-01-24 DIAGNOSIS — S99922A Unspecified injury of left foot, initial encounter: Secondary | ICD-10-CM | POA: Diagnosis not present

## 2023-01-24 DIAGNOSIS — H1031 Unspecified acute conjunctivitis, right eye: Secondary | ICD-10-CM | POA: Diagnosis not present

## 2023-01-26 DIAGNOSIS — S92332A Displaced fracture of third metatarsal bone, left foot, initial encounter for closed fracture: Secondary | ICD-10-CM | POA: Diagnosis not present

## 2023-01-26 DIAGNOSIS — S92352A Displaced fracture of fifth metatarsal bone, left foot, initial encounter for closed fracture: Secondary | ICD-10-CM | POA: Diagnosis not present

## 2023-01-26 DIAGNOSIS — S92342A Displaced fracture of fourth metatarsal bone, left foot, initial encounter for closed fracture: Secondary | ICD-10-CM | POA: Diagnosis not present

## 2023-01-26 DIAGNOSIS — S92322A Displaced fracture of second metatarsal bone, left foot, initial encounter for closed fracture: Secondary | ICD-10-CM | POA: Diagnosis not present

## 2023-02-03 DIAGNOSIS — Z23 Encounter for immunization: Secondary | ICD-10-CM | POA: Diagnosis not present

## 2023-02-16 DIAGNOSIS — Z23 Encounter for immunization: Secondary | ICD-10-CM | POA: Diagnosis not present

## 2023-02-22 ENCOUNTER — Ambulatory Visit: Payer: Medicare Other | Admitting: Podiatry

## 2023-02-23 ENCOUNTER — Ambulatory Visit: Payer: Medicare Other | Admitting: Podiatry

## 2023-02-23 DIAGNOSIS — S92352A Displaced fracture of fifth metatarsal bone, left foot, initial encounter for closed fracture: Secondary | ICD-10-CM | POA: Diagnosis not present

## 2023-02-23 DIAGNOSIS — S92332A Displaced fracture of third metatarsal bone, left foot, initial encounter for closed fracture: Secondary | ICD-10-CM | POA: Diagnosis not present

## 2023-02-23 DIAGNOSIS — S92342A Displaced fracture of fourth metatarsal bone, left foot, initial encounter for closed fracture: Secondary | ICD-10-CM | POA: Diagnosis not present

## 2023-02-23 DIAGNOSIS — S92322A Displaced fracture of second metatarsal bone, left foot, initial encounter for closed fracture: Secondary | ICD-10-CM | POA: Diagnosis not present

## 2023-03-08 ENCOUNTER — Ambulatory Visit: Payer: Medicare Other | Admitting: Podiatry

## 2023-03-09 DIAGNOSIS — R2681 Unsteadiness on feet: Secondary | ICD-10-CM | POA: Diagnosis not present

## 2023-03-09 DIAGNOSIS — R2689 Other abnormalities of gait and mobility: Secondary | ICD-10-CM | POA: Diagnosis not present

## 2023-03-09 DIAGNOSIS — S92322S Displaced fracture of second metatarsal bone, left foot, sequela: Secondary | ICD-10-CM | POA: Diagnosis not present

## 2023-03-11 DIAGNOSIS — R2681 Unsteadiness on feet: Secondary | ICD-10-CM | POA: Diagnosis not present

## 2023-03-11 DIAGNOSIS — S92322S Displaced fracture of second metatarsal bone, left foot, sequela: Secondary | ICD-10-CM | POA: Diagnosis not present

## 2023-03-11 DIAGNOSIS — R2689 Other abnormalities of gait and mobility: Secondary | ICD-10-CM | POA: Diagnosis not present

## 2023-03-14 DIAGNOSIS — Z8673 Personal history of transient ischemic attack (TIA), and cerebral infarction without residual deficits: Secondary | ICD-10-CM | POA: Diagnosis not present

## 2023-03-14 DIAGNOSIS — R413 Other amnesia: Secondary | ICD-10-CM | POA: Diagnosis not present

## 2023-03-14 DIAGNOSIS — H6123 Impacted cerumen, bilateral: Secondary | ICD-10-CM | POA: Diagnosis not present

## 2023-03-14 DIAGNOSIS — E785 Hyperlipidemia, unspecified: Secondary | ICD-10-CM | POA: Diagnosis not present

## 2023-03-14 DIAGNOSIS — F331 Major depressive disorder, recurrent, moderate: Secondary | ICD-10-CM | POA: Diagnosis not present

## 2023-03-14 DIAGNOSIS — I129 Hypertensive chronic kidney disease with stage 1 through stage 4 chronic kidney disease, or unspecified chronic kidney disease: Secondary | ICD-10-CM | POA: Diagnosis not present

## 2023-03-14 DIAGNOSIS — N1831 Chronic kidney disease, stage 3a: Secondary | ICD-10-CM | POA: Diagnosis not present

## 2023-03-14 DIAGNOSIS — I700 Atherosclerosis of aorta: Secondary | ICD-10-CM | POA: Diagnosis not present

## 2023-03-14 DIAGNOSIS — R2681 Unsteadiness on feet: Secondary | ICD-10-CM | POA: Diagnosis not present

## 2023-03-14 DIAGNOSIS — S92322S Displaced fracture of second metatarsal bone, left foot, sequela: Secondary | ICD-10-CM | POA: Diagnosis not present

## 2023-03-14 DIAGNOSIS — M810 Age-related osteoporosis without current pathological fracture: Secondary | ICD-10-CM | POA: Diagnosis not present

## 2023-03-14 DIAGNOSIS — R2689 Other abnormalities of gait and mobility: Secondary | ICD-10-CM | POA: Diagnosis not present

## 2023-03-16 ENCOUNTER — Encounter: Payer: Self-pay | Admitting: Podiatry

## 2023-03-16 ENCOUNTER — Ambulatory Visit (INDEPENDENT_AMBULATORY_CARE_PROVIDER_SITE_OTHER): Payer: Medicare Other | Admitting: Podiatry

## 2023-03-16 DIAGNOSIS — M79674 Pain in right toe(s): Secondary | ICD-10-CM

## 2023-03-16 DIAGNOSIS — M21611 Bunion of right foot: Secondary | ICD-10-CM

## 2023-03-16 DIAGNOSIS — L84 Corns and callosities: Secondary | ICD-10-CM

## 2023-03-16 DIAGNOSIS — B351 Tinea unguium: Secondary | ICD-10-CM

## 2023-03-16 DIAGNOSIS — M2041 Other hammer toe(s) (acquired), right foot: Secondary | ICD-10-CM

## 2023-03-16 DIAGNOSIS — S92322S Displaced fracture of second metatarsal bone, left foot, sequela: Secondary | ICD-10-CM | POA: Diagnosis not present

## 2023-03-16 DIAGNOSIS — M2012 Hallux valgus (acquired), left foot: Secondary | ICD-10-CM

## 2023-03-16 DIAGNOSIS — D689 Coagulation defect, unspecified: Secondary | ICD-10-CM | POA: Diagnosis not present

## 2023-03-16 DIAGNOSIS — M21612 Bunion of left foot: Secondary | ICD-10-CM

## 2023-03-16 DIAGNOSIS — R2689 Other abnormalities of gait and mobility: Secondary | ICD-10-CM | POA: Diagnosis not present

## 2023-03-16 DIAGNOSIS — M79675 Pain in left toe(s): Secondary | ICD-10-CM

## 2023-03-16 DIAGNOSIS — R2681 Unsteadiness on feet: Secondary | ICD-10-CM | POA: Diagnosis not present

## 2023-03-16 DIAGNOSIS — M2011 Hallux valgus (acquired), right foot: Secondary | ICD-10-CM

## 2023-03-16 DIAGNOSIS — M2042 Other hammer toe(s) (acquired), left foot: Secondary | ICD-10-CM

## 2023-03-16 NOTE — Progress Notes (Signed)
This patient returns to the office for evaluation and treatment of long thick painful nails .  This patient is unable to trim his own nails since the patient cannot reach her feet.  Patient says the nails are painful walking and wearing her shoes.   She also has painful callus under her big toe joint.He returns for preventive foot care services.  General Appearance  Alert, conversant and in no acute stress.  Vascular  Dorsalis pedis and posterior tibial  pulses are  weakly palpable  bilaterally.  Capillary return is within normal limits  bilaterally. Temperature is within normal limits  bilaterally.  Neurologic  Senn-Weinstein monofilament wire test within normal limits  bilaterally. Muscle power within normal limits bilaterally.  Nails Thick disfigured discolored nails with subungual debris  from hallux to fifth toes bilaterally. No evidence of bacterial infection or drainage bilaterally.  Orthopedic  No limitations of motion  feet .  No crepitus or effusions noted.  No bony pathology or digital deformities noted.  Skin  normotropic skin with no porokeratosis noted bilaterally.  No signs of infections or ulcers noted.   Pre-ulcerous callus sub 1st MPJ left foot.  Onychomycosis  Pain in toes right foot  Pain in toes left foot  Callus left foot.  Debridement  of nails  1-5  B/L with a nail nipper.  Nails were then filed using a dremel tool with no incidents.  Debride callus with dremel tool.   RTC  3 months    Helane Gunther DPM

## 2023-03-21 DIAGNOSIS — R2689 Other abnormalities of gait and mobility: Secondary | ICD-10-CM | POA: Diagnosis not present

## 2023-03-21 DIAGNOSIS — R2681 Unsteadiness on feet: Secondary | ICD-10-CM | POA: Diagnosis not present

## 2023-03-21 DIAGNOSIS — S92322S Displaced fracture of second metatarsal bone, left foot, sequela: Secondary | ICD-10-CM | POA: Diagnosis not present

## 2023-03-23 DIAGNOSIS — R2681 Unsteadiness on feet: Secondary | ICD-10-CM | POA: Diagnosis not present

## 2023-03-23 DIAGNOSIS — S92322S Displaced fracture of second metatarsal bone, left foot, sequela: Secondary | ICD-10-CM | POA: Diagnosis not present

## 2023-03-23 DIAGNOSIS — R2689 Other abnormalities of gait and mobility: Secondary | ICD-10-CM | POA: Diagnosis not present

## 2023-03-29 DIAGNOSIS — H6123 Impacted cerumen, bilateral: Secondary | ICD-10-CM | POA: Diagnosis not present

## 2023-03-31 DIAGNOSIS — S92322S Displaced fracture of second metatarsal bone, left foot, sequela: Secondary | ICD-10-CM | POA: Diagnosis not present

## 2023-03-31 DIAGNOSIS — R2681 Unsteadiness on feet: Secondary | ICD-10-CM | POA: Diagnosis not present

## 2023-03-31 DIAGNOSIS — R2689 Other abnormalities of gait and mobility: Secondary | ICD-10-CM | POA: Diagnosis not present

## 2023-04-04 DIAGNOSIS — S92322S Displaced fracture of second metatarsal bone, left foot, sequela: Secondary | ICD-10-CM | POA: Diagnosis not present

## 2023-04-04 DIAGNOSIS — R2689 Other abnormalities of gait and mobility: Secondary | ICD-10-CM | POA: Diagnosis not present

## 2023-04-04 DIAGNOSIS — R2681 Unsteadiness on feet: Secondary | ICD-10-CM | POA: Diagnosis not present

## 2023-06-20 ENCOUNTER — Ambulatory Visit: Payer: Medicare Other | Admitting: Podiatry

## 2023-07-27 ENCOUNTER — Ambulatory Visit: Payer: Medicare Other | Admitting: Podiatry

## 2023-08-11 DIAGNOSIS — L89311 Pressure ulcer of right buttock, stage 1: Secondary | ICD-10-CM | POA: Diagnosis not present

## 2023-08-11 DIAGNOSIS — L89321 Pressure ulcer of left buttock, stage 1: Secondary | ICD-10-CM | POA: Diagnosis not present

## 2023-08-11 DIAGNOSIS — B372 Candidiasis of skin and nail: Secondary | ICD-10-CM | POA: Diagnosis not present

## 2023-08-16 DIAGNOSIS — M25511 Pain in right shoulder: Secondary | ICD-10-CM | POA: Diagnosis not present

## 2023-08-16 DIAGNOSIS — N898 Other specified noninflammatory disorders of vagina: Secondary | ICD-10-CM | POA: Diagnosis not present

## 2023-08-16 DIAGNOSIS — L89321 Pressure ulcer of left buttock, stage 1: Secondary | ICD-10-CM | POA: Diagnosis not present

## 2023-08-17 ENCOUNTER — Ambulatory Visit: Admitting: Podiatry

## 2023-08-17 ENCOUNTER — Encounter: Payer: Self-pay | Admitting: Podiatry

## 2023-08-17 ENCOUNTER — Ambulatory Visit (INDEPENDENT_AMBULATORY_CARE_PROVIDER_SITE_OTHER): Admitting: Podiatry

## 2023-08-17 VITALS — Ht 62.0 in | Wt 142.0 lb

## 2023-08-17 DIAGNOSIS — D689 Coagulation defect, unspecified: Secondary | ICD-10-CM

## 2023-08-17 DIAGNOSIS — L84 Corns and callosities: Secondary | ICD-10-CM

## 2023-08-17 DIAGNOSIS — B351 Tinea unguium: Secondary | ICD-10-CM | POA: Diagnosis not present

## 2023-08-17 DIAGNOSIS — M79675 Pain in left toe(s): Secondary | ICD-10-CM | POA: Diagnosis not present

## 2023-08-17 DIAGNOSIS — M79674 Pain in right toe(s): Secondary | ICD-10-CM

## 2023-08-21 NOTE — Progress Notes (Signed)
 Subjective:  Patient ID: Cassidy Ellison, female    DOB: [birth year],  MRN: [id number]  Cassidy Ellison presents to clinic today for at risk foot care with h/o clotting disorder and preulcerative lesion(s) of both feet and painful mycotic toenails that limit ambulation. Painful toenails interfere with ambulation. Aggravating factors include wearing enclosed shoe gear. Pain is relieved with periodic professional debridement. Painful preulcerative lesion(s) is/are aggravated when weightbearing with and without shoegear. Pain is relieved with periodic professional debridement.  Chief Complaint  Patient presents with   Nail Problem    Pt is here for [facility] PCP is Dr Camilo Cella and LOV was in October.   New problem(s): None.   PCP is Victorio Grave, MD.   Allergies  Allergen Reactions   Corticosteroids Other (See Comments)    Caused patient to have low sodium levels   Chlorthalidone Other (See Comments)    creatinine  increases Other reaction(s): elevated creatinine Other reaction(s): elevated creatinine Other reaction(s): elevated creatinine Other reaction(s): elevated creatinine   Denture Adhesive     Other reaction(s): rash   Hydrochlorothiazide Other (See Comments)    CREATININE INCREASES Other reaction(s): elevated creatinine Other reaction(s): elevated creatinine Other reaction(s): elevated creatinine Other reaction(s): elevated creatinine   Methoxsalen     Other reaction(s): cream--rash Other reaction(s): cream--rash Other reaction(s): cream--rash Other reaction(s): cream--rash   Other     Other reaction(s): rash Other reaction(s): rash Other reaction(s): rash   Prednisone     Other reaction(s): hyponatremia Other reaction(s): hyponatremia Other reaction(s): hyponatremia Other reaction(s): hyponatremia   Tape Itching    Review of Systems: Negative except as noted in the HPI.  Objective: No changes noted in today's physical examination. There were no vitals filed for this  visit. Cassidy Ellison is a pleasant 89 y.o. female WD, WN in NAD. AAO x 3.  Vascular Examination: Capillary refill time immediate b/l. Vascular status intact b/l with palpable pedal pulses. Pedal hair present b/l. No pain with calf compression b/l. Skin temperature gradient WNL b/l. No cyanosis or clubbing b/l. No ischemia or gangrene noted b/l.   Neurological Examination: Sensation grossly intact b/l with 10 gram monofilament. Vibratory sensation intact b/l.   Dermatological Examination: Pedal skin with normal turgor, texture and tone b/l.  No open wounds. No interdigital macerations.   Toenails 1-5 b/l thick, discolored, elongated with subungual debris and pain on dorsal palpation.   Hyperkeratotic lesion(s) bilateral 3rd toes, left great toe, and submet head 1 left foot.  No erythema, no edema, no drainage, no fluctuance. Preulcerative lesion noted L 2nd toe. There is visible subdermal hemorrhage. There is no surrounding erythema, no edema, no drainage, no odor, no fluctuance.  Musculoskeletal Examination: Muscle strength 5/5 to all lower extremity muscle groups bilaterally. HAV with bunion deformity noted b/l LE. Hammertoe deformity noted 2-5 b/l.  Radiographs: None  Assessment/Plan: 1. Pain due to onychomycosis of toenails of both feet   2. Corns and callosities   3. Pre-ulcerative corn or callous   4. Blood clotting disorder (HCC)     -Mycotic toenails 1-5 bilaterally were debrided in length and girth with sterile nail nippers and dremel without incident. -Corn(s) bilateral 3rd toes pared utilizing sterile scalpel blade without complication or incident. Total number debrided=2. -Callus(es) left great toe and submet head 1 left foot pared utilizing sterile scalpel blade without complication or incident. Total number debrided =2. -Preulcerative lesion pared left second digit utilizing sterile scalpel blade. Total number pared=1. -Patient/POA to call should there be question/concern  in  the interim.   Return in about 3 months (around 11/16/2023).  Cassidy Ellison, DPM      [place] LOCATION: [address] [place] [address]                   Office [phone]   [facility] LOCATION: [address] [place], [place] [address] Office [phone]

## 2023-09-15 DIAGNOSIS — L89321 Pressure ulcer of left buttock, stage 1: Secondary | ICD-10-CM | POA: Diagnosis not present

## 2023-10-06 DIAGNOSIS — E041 Nontoxic single thyroid nodule: Secondary | ICD-10-CM | POA: Diagnosis not present

## 2023-10-06 DIAGNOSIS — R413 Other amnesia: Secondary | ICD-10-CM | POA: Diagnosis not present

## 2023-10-06 DIAGNOSIS — E785 Hyperlipidemia, unspecified: Secondary | ICD-10-CM | POA: Diagnosis not present

## 2023-10-06 DIAGNOSIS — Z23 Encounter for immunization: Secondary | ICD-10-CM | POA: Diagnosis not present

## 2023-10-06 DIAGNOSIS — I129 Hypertensive chronic kidney disease with stage 1 through stage 4 chronic kidney disease, or unspecified chronic kidney disease: Secondary | ICD-10-CM | POA: Diagnosis not present

## 2023-10-06 DIAGNOSIS — N1831 Chronic kidney disease, stage 3a: Secondary | ICD-10-CM | POA: Diagnosis not present

## 2023-10-06 DIAGNOSIS — F331 Major depressive disorder, recurrent, moderate: Secondary | ICD-10-CM | POA: Diagnosis not present

## 2023-10-06 DIAGNOSIS — J432 Centrilobular emphysema: Secondary | ICD-10-CM | POA: Diagnosis not present

## 2023-10-06 DIAGNOSIS — Z79899 Other long term (current) drug therapy: Secondary | ICD-10-CM | POA: Diagnosis not present

## 2023-10-06 DIAGNOSIS — Z0000 Encounter for general adult medical examination without abnormal findings: Secondary | ICD-10-CM | POA: Diagnosis not present

## 2023-10-06 DIAGNOSIS — E871 Hypo-osmolality and hyponatremia: Secondary | ICD-10-CM | POA: Diagnosis not present

## 2023-10-06 DIAGNOSIS — E559 Vitamin D deficiency, unspecified: Secondary | ICD-10-CM | POA: Diagnosis not present

## 2023-10-06 DIAGNOSIS — I700 Atherosclerosis of aorta: Secondary | ICD-10-CM | POA: Diagnosis not present

## 2023-11-30 ENCOUNTER — Ambulatory Visit (INDEPENDENT_AMBULATORY_CARE_PROVIDER_SITE_OTHER): Admitting: Podiatry

## 2023-11-30 ENCOUNTER — Encounter: Payer: Self-pay | Admitting: Podiatry

## 2023-11-30 DIAGNOSIS — M79674 Pain in right toe(s): Secondary | ICD-10-CM

## 2023-11-30 DIAGNOSIS — B351 Tinea unguium: Secondary | ICD-10-CM

## 2023-11-30 DIAGNOSIS — M79675 Pain in left toe(s): Secondary | ICD-10-CM

## 2023-11-30 DIAGNOSIS — D689 Coagulation defect, unspecified: Secondary | ICD-10-CM

## 2023-11-30 DIAGNOSIS — L84 Corns and callosities: Secondary | ICD-10-CM | POA: Diagnosis not present

## 2023-12-04 NOTE — Progress Notes (Signed)
 Subjective:  Patient ID: Cassidy Ellison, female    DOB: 1921-09-14,  MRN: [id number]  Cassidy Ellison presents to clinic today for at risk foot care with h/o clotting disorder and corn(s) of both feet, callus(es) of both feet and painful elongated, discolored, dystrophic nails.  Pain interferes with ambulation. Aggravating factors include wearing enclosed shoe gear. Painful toenails interfere with ambulation. Aggravating factors include wearing enclosed shoe gear. Pain is relieved with periodic professional debridement. Painful corns and calluses are aggravated when weightbearing with and without shoegear. Pain is relieved with periodic professional debridement.  Chief Complaint  Patient presents with   RFC    RFC non diabetic toenail trim. LOV with PCP 08/16/23/   New problem(s): None.   PCP is Teresa Channel, MD.  Allergies  Allergen Reactions   Corticosteroids Other (See Comments)    Caused patient to have low sodium levels   Chlorthalidone Other (See Comments)    creatinine  increases Other reaction(s): elevated creatinine Other reaction(s): elevated creatinine Other reaction(s): elevated creatinine Other reaction(s): elevated creatinine   Denture Adhesive     Other reaction(s): rash   Hydrochlorothiazide Other (See Comments)    CREATININE INCREASES Other reaction(s): elevated creatinine Other reaction(s): elevated creatinine Other reaction(s): elevated creatinine Other reaction(s): elevated creatinine   Methoxsalen     Other reaction(s): cream--rash Other reaction(s): cream--rash Other reaction(s): cream--rash Other reaction(s): cream--rash   Other     Other reaction(s): rash Other reaction(s): rash Other reaction(s): rash   Prednisone      Other reaction(s): hyponatremia Other reaction(s): hyponatremia Other reaction(s): hyponatremia Other reaction(s): hyponatremia   Tape Itching    Review of Systems: Negative except as noted in the HPI.  Objective: No changes noted in  today's physical examination. There were no vitals filed for this visit. Cassidy Ellison is a pleasant 89 y.o. female WD, WN in NAD. AAO x 3.  Vascular Examination: Capillary refill time immediate b/l. Palpable pedal pulses. Pedal hair present b/l. Pedal edema absent. No pain with calf compression b/l. Skin temperature gradient WNL b/l. No cyanosis or clubbing b/l. No ischemia or gangrene noted b/l.   Neurological Examination: Sensation grossly intact b/l with 10 gram monofilament. Vibratory sensation intact b/l.   Dermatological Examination: Pedal skin with normal turgor, texture and tone b/l.  No open wounds. No interdigital macerations.   Toenails 1-5 b/l thick, discolored, elongated with subungual debris and pain on dorsal palpation.   Hyperkeratotic lesion(s) medial IPJ of left great toe, dorsal PIPJ of L 2nd toe, and submet head 1 left foot.  No erythema, no edema, no drainage, no fluctuance.  Musculoskeletal Examination: Muscle strength 5/5 to all lower extremity muscle groups bilaterally. HAV with bunion bilaterally and hammertoes 2-5 b/l.  Radiographs: None Assessment/Plan: 1. Pain due to onychomycosis of toenails of both feet   2. Corns and callosities   3. Blood clotting disorder [facility])     Consent given for treatment. Patient examined. All patient's and/or POA's questions/concerns addressed on today's visit. Recommended shoes from [employer]. Toenails 1-5 debrided in length and girth without incident. Corn(s) and callus(es) medial IPJ of left great toe, dorsal PIPJ of L 2nd toe, and submet head 1 left foot pared with sharp debridement. Light bleeding addressed with Lumicain Hemostatic solution. No further treatment required by patient. Treatment was provided by assistant Andrez Manchester under my supervision. Continue soft, supportive shoe gear daily. Report any pedal injuries to medical professional. Call office if there are any questions/concerns. Return in  about 15 weeks (around 03/14/2024).  Delon LITTIE Merlin, DPM      [place] LOCATION: [address], [place] [address]                   Office [phone]   [facility] LOCATION: [address] [place], [place] [address] Office [phone]

## 2024-02-06 DIAGNOSIS — Z23 Encounter for immunization: Secondary | ICD-10-CM | POA: Diagnosis not present

## 2024-03-06 DIAGNOSIS — Z23 Encounter for immunization: Secondary | ICD-10-CM | POA: Diagnosis not present

## 2024-03-14 ENCOUNTER — Encounter: Payer: Self-pay | Admitting: Podiatry

## 2024-03-14 ENCOUNTER — Ambulatory Visit: Admitting: Podiatry

## 2024-03-14 DIAGNOSIS — M79674 Pain in right toe(s): Secondary | ICD-10-CM | POA: Diagnosis not present

## 2024-03-14 DIAGNOSIS — B351 Tinea unguium: Secondary | ICD-10-CM

## 2024-03-14 DIAGNOSIS — L84 Corns and callosities: Secondary | ICD-10-CM | POA: Diagnosis not present

## 2024-03-14 DIAGNOSIS — D689 Coagulation defect, unspecified: Secondary | ICD-10-CM

## 2024-03-14 DIAGNOSIS — M79675 Pain in left toe(s): Secondary | ICD-10-CM | POA: Diagnosis not present

## 2024-03-22 NOTE — Progress Notes (Signed)
 Subjective:  Patient ID: Cassidy Ellison, female    DOB: [birth year],  MRN: [id number]  Cassidy Ellison presents to clinic today for at risk foot care with h/o clotting disorder and corn(s) b/l feet, callus(es) left foot and painful elongated, discolored, dystrophic nails.  Pain interferes with ambulation. Aggravating factors include wearing enclosed shoe gear. Painful toenails interfere with ambulation. Aggravating factors include wearing enclosed shoe gear. Pain is relieved with periodic professional debridement. Painful corns and calluses are aggravated when weightbearing with and without shoegear. Pain is relieved with periodic professional debridement. Patient needs clarification on shoes I recommended to her on last visit. Chief Complaint  Patient presents with   RFC     RFC Non diabetic toenail trim, LOV with PCP 10/06/23.   New problem(s): None.   PCP is Teresa Channel, MD.  Allergies  Allergen Reactions   Corticosteroids Other (See Comments)    Caused patient to have low sodium levels   Chlorthalidone Other (See Comments)    creatinine  increases Other reaction(s): elevated creatinine Other reaction(s): elevated creatinine Other reaction(s): elevated creatinine Other reaction(s): elevated creatinine   Denture Adhesive     Other reaction(s): rash   Hydrochlorothiazide Other (See Comments)    CREATININE INCREASES Other reaction(s): elevated creatinine Other reaction(s): elevated creatinine Other reaction(s): elevated creatinine Other reaction(s): elevated creatinine   Methoxsalen     Other reaction(s): cream--rash Other reaction(s): cream--rash Other reaction(s): cream--rash Other reaction(s): cream--rash   Other     Other reaction(s): rash Other reaction(s): rash Other reaction(s): rash   Prednisone      Other reaction(s): hyponatremia Other reaction(s): hyponatremia Other reaction(s): hyponatremia Other reaction(s): hyponatremia   Tape Itching    Review of Systems:  Negative except as noted in the HPI.  Objective: No changes noted in today's physical examination. There were no vitals filed for this visit. Cassidy Ellison is a pleasant 89 y.o. female WD, WN in NAD. AAO x 3.   Vascular Examination: Capillary refill time immediate b/l. Palpable pedal pulses. Pedal hair present b/l. Pedal edema absent. No pain with calf compression b/l. Skin temperature gradient WNL b/l. No cyanosis or clubbing b/l. No ischemia or gangrene noted b/l.   Neurological Examination: Sensation grossly intact b/l with 10 gram monofilament. Vibratory sensation intact b/l.   Dermatological Examination: Pedal skin with normal turgor, texture and tone b/l.  No open wounds. No interdigital macerations.   Toenails 1-5 b/l thick, discolored, elongated with subungual debris and pain on dorsal palpation.   Hyperkeratotic lesion(s) medial IPJ of left great toe, dorsal PIPJ of L 2nd toe, and submet head 1 left foot.  No erythema, no edema, no drainage, no fluctuance.  Musculoskeletal Examination: Muscle strength 5/5 to all lower extremity muscle groups bilaterally. HAV with bunion bilaterally and hammertoes 2-5 b/l.  Radiographs: None  Assessment/Plan: 1. Pain due to onychomycosis of toenails of both feet   2. Corns and callosities   3. Blood clotting disorder   Recommended shoes from the [employer]. Patient was evaluated and treated. All patient's and/or POA's questions/concerns addressed on today's visit. Mycotic toenails 1-5 b/l debrided in length and girth without incident. Corn(s)/Callus(es) medial IPJ of left great toe, dorsal PIPJ of left second digit, and submet head 1 left foot pared with sharp debridement without incident. Continue soft, supportive shoe gear daily. Report any pedal injuries to medical professional. Call office if there are any questions/concerns.  Return in about 3 months (around 06/14/2024).  Delon LITTIE Merlin,  DPM       [place] LOCATION: [address] [place] [address]                   Office [phone]   [facility] LOCATION: [address] [place], [place] [address] Office [phone]

## 2024-04-02 ENCOUNTER — Encounter (HOSPITAL_COMMUNITY): Payer: Self-pay | Admitting: Emergency Medicine

## 2024-04-02 ENCOUNTER — Emergency Department (HOSPITAL_COMMUNITY)

## 2024-04-02 ENCOUNTER — Emergency Department (HOSPITAL_COMMUNITY)
Admission: EM | Admit: 2024-04-02 | Discharge: 2024-04-02 | Disposition: A | Discharge status: Home / Self Care | Source: Skilled Nursing Facility | Attending: Emergency Medicine | Admitting: Emergency Medicine

## 2024-04-02 ENCOUNTER — Other Ambulatory Visit: Payer: Self-pay

## 2024-04-02 DIAGNOSIS — R918 Other nonspecific abnormal finding of lung field: Secondary | ICD-10-CM | POA: Insufficient documentation

## 2024-04-02 DIAGNOSIS — W01198A Fall on same level from slipping, tripping and stumbling with subsequent striking against other object, initial encounter: Secondary | ICD-10-CM | POA: Diagnosis not present

## 2024-04-02 DIAGNOSIS — S0990XA Unspecified injury of head, initial encounter: Secondary | ICD-10-CM | POA: Diagnosis not present

## 2024-04-02 DIAGNOSIS — S299XXA Unspecified injury of thorax, initial encounter: Secondary | ICD-10-CM | POA: Diagnosis not present

## 2024-04-02 DIAGNOSIS — M5030 Other cervical disc degeneration, unspecified cervical region: Secondary | ICD-10-CM | POA: Diagnosis not present

## 2024-04-02 DIAGNOSIS — M79642 Pain in left hand: Secondary | ICD-10-CM | POA: Diagnosis present

## 2024-04-02 DIAGNOSIS — M85842 Other specified disorders of bone density and structure, left hand: Secondary | ICD-10-CM | POA: Diagnosis not present

## 2024-04-02 DIAGNOSIS — W19XXXA Unspecified fall, initial encounter: Secondary | ICD-10-CM | POA: Diagnosis not present

## 2024-04-02 DIAGNOSIS — S62645A Nondisplaced fracture of proximal phalanx of left ring finger, initial encounter for closed fracture: Secondary | ICD-10-CM | POA: Diagnosis not present

## 2024-04-02 DIAGNOSIS — Y92002 Bathroom of unspecified non-institutional (private) residence single-family (private) house as the place of occurrence of the external cause: Secondary | ICD-10-CM | POA: Diagnosis not present

## 2024-04-02 DIAGNOSIS — R58 Hemorrhage, not elsewhere classified: Secondary | ICD-10-CM | POA: Diagnosis not present

## 2024-04-02 DIAGNOSIS — S62615A Displaced fracture of proximal phalanx of left ring finger, initial encounter for closed fracture: Secondary | ICD-10-CM | POA: Diagnosis not present

## 2024-04-02 DIAGNOSIS — S199XXA Unspecified injury of neck, initial encounter: Secondary | ICD-10-CM | POA: Diagnosis not present

## 2024-04-02 DIAGNOSIS — M1812 Unilateral primary osteoarthritis of first carpometacarpal joint, left hand: Secondary | ICD-10-CM | POA: Diagnosis not present

## 2024-04-02 DIAGNOSIS — M79645 Pain in left finger(s): Secondary | ICD-10-CM | POA: Diagnosis present

## 2024-04-02 DIAGNOSIS — M4802 Spinal stenosis, cervical region: Secondary | ICD-10-CM | POA: Diagnosis not present

## 2024-04-02 DIAGNOSIS — M47812 Spondylosis without myelopathy or radiculopathy, cervical region: Secondary | ICD-10-CM | POA: Diagnosis not present

## 2024-04-02 DIAGNOSIS — I771 Stricture of artery: Secondary | ICD-10-CM | POA: Diagnosis not present

## 2024-04-02 DIAGNOSIS — I700 Atherosclerosis of aorta: Secondary | ICD-10-CM | POA: Diagnosis not present

## 2024-04-02 DIAGNOSIS — S6990XA Unspecified injury of unspecified wrist, hand and finger(s), initial encounter: Secondary | ICD-10-CM | POA: Diagnosis not present

## 2024-04-02 DIAGNOSIS — M19042 Primary osteoarthritis, left hand: Secondary | ICD-10-CM | POA: Insufficient documentation

## 2024-04-02 DIAGNOSIS — R0989 Other specified symptoms and signs involving the circulatory and respiratory systems: Secondary | ICD-10-CM | POA: Diagnosis not present

## 2024-04-02 LAB — CBG MONITORING, ED: Glucose-Capillary: 95 mg/dL (ref 70–99)

## 2024-04-02 NOTE — ED Provider Notes (Signed)
 [place] EMERGENCY DEPARTMENT AT [facility] Provider Note   CSN: [id number] Arrival date & time: 04/02/24  1112     Patient presents with: Cassidy Ellison is a 89 y.o. female.   89 year old female with prior medical history as detailed below presents from [facility] after fall.  Patient reports that she was in her bathroom.  She did use the toilet.  As she was standing she lost her balance and fell.  She thinks that she hit her head against the edge of the tub.  She denies LOC.  She denies headache or neck pain.  She injured her left hand.  She complains of pain and bruising to the left ring finger.  She denies other injury.  She was ambulatory after the fall.  She is not on blood thinners.  The history is provided by the patient and medical records.       Prior to Admission medications   Medication Sig Start Date End Date Taking? Authorizing Provider  acetaminophen  (TYLENOL ) 325 MG tablet 2 tablet as needed 04/29/21   [provider]  albuterol  (VENTOLIN  HFA) 108 (90 Base) MCG/ACT inhaler Inhale 2 puffs into the lungs every 4 (four) hours as needed for wheezing or shortness of breath. 04/29/21   Cresenzo, John V, MD  alendronate (FOSAMAX) 70 MG tablet  12/13/18   [provider]  atorvastatin  (LIPITOR) 20 MG tablet Take 20 mg by mouth daily.  07/28/17   [provider]  benzonatate  (TESSALON ) 100 MG capsule Take 1 capsule (100 mg total) by mouth 3 (three) times daily as needed for cough. 04/29/21   Cresenzo, John V, MD  Cholecalciferol  (VITAMIN D -3) 1000 units CAPS Take 1,000 Units by mouth daily.    [provider]  Cholecalciferol  (VITAMIN D3) 25 MCG (1000 UT) CAPS 1 capsule    [provider]  docusate sodium  (COLACE) 100 MG capsule 100 mg daily as needed for mild constipation.    [provider]  famotidine (PEPCID) 20 MG tablet Take by mouth.    [provider]  loperamide (IMODIUM) 2 MG capsule  Take 2 mg by mouth as needed for diarrhea or loose stools.    [provider]  metoprolol  succinate (TOPROL -XL) 50 MG 24 hr tablet Take 50 mg by mouth daily. 05/01/21   [provider]  mirtazapine (REMERON) 30 MG tablet Take 30 mg by mouth at bedtime. 07/29/21   [provider]  Multiple Vitamins-Minerals (MULTIVITAMIN WITH MINERALS) tablet Take 1 tablet by mouth daily.     [provider]  pantoprazole  (PROTONIX ) 40 MG tablet  11/17/18   [provider]  rOPINIRole  (REQUIP ) 1 MG tablet Take 1 mg by mouth at bedtime.    [provider]  umeclidinium-vilanterol (ANORO ELLIPTA ) 62.5-25 MCG/ACT AEPB Inhale 1 puff into the lungs daily. 04/30/21   Cresenzo, John V, MD  vitamin C  (ASCORBIC ACID ) 500 MG tablet Take 500 mg by mouth daily.    [provider]    Allergies: Corticosteroids, Chlorthalidone, Denture adhesive, Hydrochlorothiazide, Methoxsalen, Other, Prednisone , and Tape    Review of Systems  All other systems reviewed and are negative.   Updated Vital Signs BP (!) 142/88   Pulse 90   Temp (!) 97.5 F (36.4 C)   Resp 12   SpO2 97%   Physical Exam Vitals and nursing note reviewed.  Constitutional:      General: She is not in acute distress.    Appearance:  Normal appearance. She is well-developed.  HENT:     Head: Normocephalic and atraumatic.  Eyes:     Conjunctiva/sclera: Conjunctivae normal.     Pupils: Pupils are equal, round, and reactive to light.  Cardiovascular:     Rate and Rhythm: Normal rate and regular rhythm.     Heart sounds: Normal heart sounds.  Pulmonary:     Effort: Pulmonary effort is normal. No respiratory distress.     Breath sounds: Normal breath sounds.  Abdominal:     General: There is no distension.     Palpations: Abdomen is soft.     Tenderness: There is no abdominal tenderness.  Musculoskeletal:        General: No deformity. Normal range of motion.     Cervical back: Normal range  of motion and neck supple.     Comments: Ecchymosis noted to the left proximal ring finger.  Range of motion of the left ring finger is slightly decreased secondary to pain.  No break in the skin noted.  Skin:    General: Skin is warm and dry.  Neurological:     General: No focal deficit present.     Mental Status: She is alert and oriented to person, place, and time.     (all labs ordered are listed, but only abnormal results are displayed) Labs Reviewed - No data to display  EKG: None  Radiology: No results found.   Procedures   Medications Ordered in the ED - No data to display                                  Medical Decision Making Presents after fall.  She did strike her head.  She injured her left hand.  Imaging of the head and C-spine are without acute abnormality.  Patient is at baseline mental status.  X-ray of the left hand demonstrates left proximal phalange E fracture of the left ring finger.  Case discussed with hand coverage -Ozell Purchase.  Splint applied.  Patient is appropriate for outpatient follow-up with Dr. Arlinda.   Patient's daughter at bedside understands need for close outpatient follow-up.  Strict return precautions given and understood.  Parts of close follow-up stressed.  Amount and/or Complexity of Data Reviewed Radiology: ordered.        Final diagnoses:  Fall, initial encounter  Injury of head, initial encounter  Closed nondisplaced fracture of proximal phalanx of left ring finger, initial encounter    ED Discharge Orders     None          Cassidy Maude BROCKS, MD 04/02/24 1535

## 2024-04-02 NOTE — ED Triage Notes (Signed)
 Patient BIB EMS from [facility] c/o unwitnessed fall. Patient report she fell after she used the bathroom. Patient report she hit her head in the tub. Patient denies LOC. Patient denies taking blood thinner. Patient c/o 7/10 left hand pain.

## 2024-04-02 NOTE — Discharge Instructions (Addendum)
 Return for any problem.   Please follow-up with Dr. AGARWALA  - Hand specialist -for further treatment of your broken finger.

## 2024-04-02 NOTE — Progress Notes (Signed)
 Orthopedic Tech Progress Note Patient Details:  Cassidy Ellison 1922-04-13 [id number] Applied ulnar gutter splint per order.  Ortho Devices Type of Ortho Device: Ace wrap, Cotton web roll, Ulna gutter splint Ortho Device/Splint Location: LUE Ortho Device/Splint Interventions: Ordered, Application, Adjustment   Post Interventions Instructions Provided: Adjustment of device, Poper ambulation with device, Care of device  Morna Pink 04/02/2024, 1:26 PM

## 2024-04-03 DIAGNOSIS — W19XXXA Unspecified fall, initial encounter: Secondary | ICD-10-CM | POA: Diagnosis not present

## 2024-04-03 DIAGNOSIS — S62665A Nondisplaced fracture of distal phalanx of left ring finger, initial encounter for closed fracture: Secondary | ICD-10-CM | POA: Diagnosis not present

## 2024-04-08 NOTE — Progress Notes (Unsigned)
 Cassidy Ellison - 89 y.o. female MRN [id number]  Date of birth: 1922-03-10  Office Visit Note: Visit Date: 04/09/2024 PCP: Teresa Channel, MD Referred by: Teresa Channel, MD  Subjective: No chief complaint on file.  HPI: Cassidy Ellison is a pleasant 89 y.o. female who presents today for evaluation of left hand ring finger injury sustained 1 week prior.  Injury mechanism describes a mechanical fall onto the outstretched left hand.  She was seen in the emergency department setting, underwent clinical and radiographic workup which showed a ring finger proximal phalanx fracture with minimal displacement.  Fracture was noted to be comminuted and segmental in nature.  There is notable osteoarthritis diffusely throughout the hand.  Pain is currently controlled at rest.  She has limited usage of the hand at baseline per her daughter who presents with her today.  Pertinent ROS were reviewed with the patient and found to be negative unless otherwise specified above in HPI.   Visit Reason: left hand/ ring finger Duration of symptoms: 1 week Hand dominance: right Occupation: retired Diabetic: No Smoking: No Heart/Lung History:Cardiovascular and Mediastinum  Essential hypertension Complicated migraine TIA (transient ischemic attack) Cerebrovascular accident (CVA) (HCC) HTN (hypertension) Hardening of the aorta (main artery of the heart)   Respiratory Pulmonary nodule Allergic rhinitis   Blood Thinners: none  Prior Testing/EMG: xrays 04/02/24 Injections (Date): none Treatments: splint Prior Surgery: none    Assessment & Plan: Visit Diagnoses:  1. Closed nondisplaced fracture of proximal phalanx of left ring finger, initial encounter     Plan: Extensive discussion was had the patient and her daughter today regarding her left ring finger proximal phalanx fracture.  Fortunately, her fracture is nondisplaced in nature and her pain is well-controlled.  Given the ongoing arthritic nature of  her left hand, her age and ongoing comorbidities, we can treat this fracture with minimalist approach.  She had been placed into an ulnar gutter splint which was becoming quite difficult for her to tolerate.  At this juncture, we can transition to buddy taping of the ring to the small finger for ongoing support at the P1 level.  This was demonstrated today utilizing Coban.  She can follow-up in approximately 3 to 4 weeks for repeat clinical and radiographic check to ensure ongoing healing.  We discussed the appropriate timeline for fracture healing particularly given her age as well as the possibility for delayed healing, malunion or nonunion.  Patient and daughter expressed full understanding.  Follow-up: No follow-ups on file.   Meds & Orders: No orders of the defined types were placed in this encounter.  No orders of the defined types were placed in this encounter.    Procedures: No procedures performed      Clinical History: No specialty comments available.  She reports that she quit smoking about 33 years ago. Her smoking use included cigarettes. She started smoking about 63 years ago. She has a 45 pack-year smoking history. She has never used smokeless tobacco. No results for input(s): HGBA1C, LABURIC in the last 8760 hours.  Objective:   Vital Signs: There were no vitals taken for this visit.  Physical Exam  Gen: Well-appearing, in no acute distress; non-toxic CV: Regular Rate. Well-perfused. Warm.  Resp: Breathing unlabored on room air; no wheezing. Psych: Fluid speech in conversation; appropriate affect; normal thought process  Ortho Exam Left hand: - Notable tenderness over the ring finger proximal phalanx region, significant ecchymotic staining is seen over the dorsal aspect of the hand and digits,  skin is intact, digits remain well-perfused, sensation intact distally   Imaging: No results found. Prior x-rays of the left hand were reviewed in detail  Past  Medical/Family/Surgical/Social History: Medications & Allergies reviewed per EMR, new medications updated. Patient Active Problem List   Diagnosis Date Noted   Leukocytosis 07/15/2021   Moderate recurrent major depression (HCC) 07/15/2021   COVID-19 virus infection 04/27/2021   COVID-19 04/26/2021   Pre-ulcerative corn or callous 04/09/2021   Age-related osteoporosis without current pathological fracture 06/30/2020   Allergic rhinitis 06/30/2020   Amnesia 06/30/2020   Dyslipidemia 06/30/2020   Hardening of the aorta (main artery of the heart) 06/30/2020   Irritable bowel syndrome with diarrhea 06/30/2020   Personal history of transient ischemic attack (TIA), and cerebral infarction without residual deficits 06/30/2020   Postcholecystectomy diarrhea 06/30/2020   Psoriasis 06/30/2020   Recurrent falls 06/30/2020   Thyroid  nodule 06/30/2020   Urinary incontinence 06/30/2020   Vitamin D  deficiency 06/30/2020   Left ear impacted cerumen 06/17/2020   Otorrhea, right 06/17/2020   Bilateral hearing loss 06/17/2020   Bilateral sensorineural hearing loss 06/17/2020   Wheezing 02/09/2018   Pulmonary nodule 02/09/2018   Palpitations 08/12/2015   HTN (hypertension) 08/12/2015   Acute confusional state 08/12/2015   Cerebrovascular accident (CVA) (HCC)    Hyperlipidemia    TIA (transient ischemic attack) 06/13/2015   Influenza with pneumonia 05/17/2015   Essential hypertension 05/15/2015   GERD (gastroesophageal reflux disease) 05/15/2015   Cough 05/15/2015   Complicated migraine 05/15/2015   RLS (restless legs syndrome)    Hyponatremia    Stridor    Dehydration 05/11/2015   Lower urinary tract infectious disease 05/11/2015   Acute bronchitis with bronchospasm 05/11/2015   Past Medical History:  Diagnosis Date   Carpal tunnel syndrome    left   COPD (chronic obstructive pulmonary disease) (HCC)    DVT (deep venous thrombosis) (HCC)    GERD (gastroesophageal reflux disease)     Hyperlipemia    Hypertension    RLS (restless legs syndrome)    Family History  Problem Relation Age of Onset   Heart failure Father    Heart failure Mother    Cancer Sister        breast cancer   Diabetes Brother    Past Surgical History:  Procedure Laterality Date   ABDOMINAL HYSTERECTOMY     CARPAL TUNNEL RELEASE Left 10/31/2014   Procedure: LEFT CARPAL TUNNEL RELEASE;  Surgeon: Franky Curia, MD;  Location: [facility];  Service: Orthopedics;  Laterality: Left;   CARPAL TUNNEL RELEASE Right 02/12/2016   Procedure: CARPAL TUNNEL RELEASE, right;  Surgeon: Franky Curia, MD;  Location:  SURGERY CENTER;  Service: Orthopedics;  Laterality: Right;   GALLBLADDER SURGERY     Social History   Occupational History   Not on file  Tobacco Use   Smoking status: Former    Current packs/day: 0.00    Average packs/day: 1.5 packs/day for 30.0 years (45.0 ttl pk-yrs)    Types: Cigarettes    Start date: 05/10/1960    Quit date: 05/10/1990    Years since quitting: 33.9   Smokeless tobacco: Never   Tobacco comments:    quit 20years ago  Vaping Use   Vaping status: Never Used  Substance and Sexual Activity   Alcohol use: Yes    Comment: social events   Drug use: No   Sexual activity: Not on file    Cambreigh Dearing Afton Alderton, M.D. [facility], Hand  Surgery

## 2024-04-09 ENCOUNTER — Ambulatory Visit: Admitting: Orthopedic Surgery

## 2024-04-09 DIAGNOSIS — S62645A Nondisplaced fracture of proximal phalanx of left ring finger, initial encounter for closed fracture: Secondary | ICD-10-CM | POA: Diagnosis not present

## 2024-04-11 DIAGNOSIS — H9193 Unspecified hearing loss, bilateral: Secondary | ICD-10-CM | POA: Diagnosis not present

## 2024-04-11 DIAGNOSIS — F331 Major depressive disorder, recurrent, moderate: Secondary | ICD-10-CM | POA: Diagnosis not present

## 2024-04-11 DIAGNOSIS — N1831 Chronic kidney disease, stage 3a: Secondary | ICD-10-CM | POA: Diagnosis not present

## 2024-04-11 DIAGNOSIS — K219 Gastro-esophageal reflux disease without esophagitis: Secondary | ICD-10-CM | POA: Diagnosis not present

## 2024-04-11 DIAGNOSIS — E785 Hyperlipidemia, unspecified: Secondary | ICD-10-CM | POA: Diagnosis not present

## 2024-04-11 DIAGNOSIS — I129 Hypertensive chronic kidney disease with stage 1 through stage 4 chronic kidney disease, or unspecified chronic kidney disease: Secondary | ICD-10-CM | POA: Diagnosis not present

## 2024-04-11 DIAGNOSIS — E441 Mild protein-calorie malnutrition: Secondary | ICD-10-CM | POA: Diagnosis not present

## 2024-04-11 DIAGNOSIS — G2581 Restless legs syndrome: Secondary | ICD-10-CM | POA: Diagnosis not present

## 2024-04-11 DIAGNOSIS — R413 Other amnesia: Secondary | ICD-10-CM | POA: Diagnosis not present

## 2024-04-11 DIAGNOSIS — Z9181 History of falling: Secondary | ICD-10-CM | POA: Diagnosis not present

## 2024-04-11 DIAGNOSIS — S62645D Nondisplaced fracture of proximal phalanx of left ring finger, subsequent encounter for fracture with routine healing: Secondary | ICD-10-CM | POA: Diagnosis not present

## 2024-05-01 NOTE — Progress Notes (Signed)
 "  Cassidy Ellison - 89 y.o. female MRN [id number]  Date of birth: [birth year]  Office Visit Note: Visit Date: 05/02/2024 PCP: Teresa Channel, MD Referred by: Teresa Channel, MD  Subjective: No chief complaint on file.  HPI: Cassidy Ellison is a pleasant 89 y.o. female who presents today for 4-week follow-up of ring finger proximal phalanx fracture with minimal displacement.  Fracture was noted to be comminuted and segmental in nature.  There is notable osteoarthritis diffusely throughout the hand.  Pain is currently controlled at rest.  She has limited usage of the hand at baseline per her daughter who presents with her today.  She has been doing buddy taping as instructed.  Pertinent ROS were reviewed with the patient and found to be negative unless otherwise specified above in HPI.      Assessment & Plan: Visit Diagnoses:  1. Closed nondisplaced fracture of proximal phalanx of left ring finger, initial encounter     Plan: Extensive discussion was had the patient and her daughter today regarding her left ring finger proximal phalanx fracture.  Fortunately, her fracture remains nondisplaced in nature and her pain is well-controlled.  Given the ongoing arthritic nature of her left hand, her age and ongoing comorbidities, we we will continue to treat this fracture with minimalist approach.  At this juncture, we can continue to buddy taping of the ring to the small finger for ongoing support at the P1 level for an additional 2 to 3 weeks.  This was demonstrated today utilizing Coban.  She can follow-up in approximately 4 weeks for repeat clinical and radiographic check to ensure ongoing healing.  We discussed the appropriate timeline for fracture healing particularly given her age as well as the possibility for delayed healing, malunion or nonunion.  Patient and daughter expressed full understanding.  Follow-up: No follow-ups on file.   Meds & Orders: No orders of the defined types were placed  in this encounter.   Orders Placed This Encounter  Procedures   XR Finger Ring Left     Procedures: No procedures performed      Clinical History: No specialty comments available.  She reports that she quit smoking about 34 years ago. Her smoking use included cigarettes. She started smoking about 64 years ago. She has a 45 pack-year smoking history. She has never used smokeless tobacco. No results for input(s): HGBA1C, LABURIC in the last 8760 hours.  Objective:   Vital Signs: There were no vitals taken for this visit.  Physical Exam  Gen: Well-appearing, in no acute distress; non-toxic CV: Regular Rate. Well-perfused. Warm.  Resp: Breathing unlabored on room air; no wheezing. Psych: Fluid speech in conversation; appropriate affect; normal thought process  Ortho Exam Left hand: - Moderate tenderness over the ring finger proximal phalanx region, significant ecchymotic staining is seen over the dorsal aspect of the hand and digits, skin is intact, digits remain well-perfused, sensation intact distally   Imaging: No results found. x-rays of the left hand were reviewed in detail  Past Medical/Family/Surgical/Social History: Medications & Allergies reviewed per EMR, new medications updated. Patient Active Problem List   Diagnosis Date Noted   Leukocytosis 07/15/2021   Moderate recurrent major depression (HCC) 07/15/2021   COVID-19 virus infection 04/27/2021   COVID-19 04/26/2021   Pre-ulcerative corn or callous 04/09/2021   Age-related osteoporosis without current pathological fracture 06/30/2020   Allergic rhinitis 06/30/2020   Amnesia 06/30/2020   Dyslipidemia 06/30/2020   Hardening of the aorta (main artery of the heart)  06/30/2020   Irritable bowel syndrome with diarrhea 06/30/2020   Personal history of transient ischemic attack (TIA), and cerebral infarction without residual deficits 06/30/2020   Postcholecystectomy diarrhea 06/30/2020   Psoriasis 06/30/2020    Recurrent falls 06/30/2020   Thyroid  nodule 06/30/2020   Urinary incontinence 06/30/2020   Vitamin D  deficiency 06/30/2020   Left ear impacted cerumen 06/17/2020   Otorrhea, right 06/17/2020   Bilateral hearing loss 06/17/2020   Bilateral sensorineural hearing loss 06/17/2020   Wheezing 02/09/2018   Pulmonary nodule 02/09/2018   Palpitations 08/12/2015   HTN (hypertension) 08/12/2015   Acute confusional state 08/12/2015   Cerebrovascular accident (CVA) (HCC)    Hyperlipidemia    TIA (transient ischemic attack) 06/13/2015   Influenza with pneumonia 05/17/2015   Essential hypertension 05/15/2015   GERD (gastroesophageal reflux disease) 05/15/2015   Cough 05/15/2015   Complicated migraine 05/15/2015   RLS (restless legs syndrome)    Hyponatremia    Stridor    Dehydration 05/11/2015   Lower urinary tract infectious disease 05/11/2015   Acute bronchitis with bronchospasm 05/11/2015   Past Medical History:  Diagnosis Date   Carpal tunnel syndrome    left   COPD (chronic obstructive pulmonary disease) (HCC)    DVT (deep venous thrombosis) (HCC)    GERD (gastroesophageal reflux disease)    Hyperlipemia    Hypertension    RLS (restless legs syndrome)    Family History  Problem Relation Age of Onset   Heart failure Father    Heart failure Mother    Cancer Sister        breast cancer   Diabetes Brother    Past Surgical History:  Procedure Laterality Date   ABDOMINAL HYSTERECTOMY     CARPAL TUNNEL RELEASE Left 10/31/2014   Procedure: LEFT CARPAL TUNNEL RELEASE;  Surgeon: Franky Curia, MD;  Location: [facility];  Service: Orthopedics;  Laterality: Left;   CARPAL TUNNEL RELEASE Right 02/12/2016   Procedure: CARPAL TUNNEL RELEASE, right;  Surgeon: Franky Curia, MD;  Location: [facility];  Service: Orthopedics;  Laterality: Right;   GALLBLADDER SURGERY     Social History   Occupational History   Not on file  Tobacco Use   Smoking status: Former     Current packs/day: 0.00    Average packs/day: 1.5 packs/day for 30.0 years (45.0 ttl pk-yrs)    Types: Cigarettes    Start date: 05/10/1960    Quit date: 05/10/1990    Years since quitting: 34.0   Smokeless tobacco: Never   Tobacco comments:    quit 20years ago  Vaping Use   Vaping status: Never Used  Substance and Sexual Activity   Alcohol use: Yes    Comment: social events   Drug use: No   Sexual activity: Not on file    Velera Lansdale Afton Alderton, M.D. [facility], Hand Surgery   "

## 2024-05-02 ENCOUNTER — Ambulatory Visit: Admitting: Orthopedic Surgery

## 2024-05-02 ENCOUNTER — Other Ambulatory Visit: Payer: Self-pay

## 2024-05-02 DIAGNOSIS — S62645A Nondisplaced fracture of proximal phalanx of left ring finger, initial encounter for closed fracture: Secondary | ICD-10-CM | POA: Diagnosis not present

## 2024-05-25 ENCOUNTER — Emergency Department (HOSPITAL_COMMUNITY)
Admission: EM | Admit: 2024-05-25 | Discharge: 2024-05-25 | Disposition: A | Discharge status: Home / Self Care | Source: Skilled Nursing Facility | Attending: Emergency Medicine | Admitting: Emergency Medicine

## 2024-05-25 ENCOUNTER — Emergency Department (HOSPITAL_COMMUNITY)

## 2024-05-25 DIAGNOSIS — I10 Essential (primary) hypertension: Secondary | ICD-10-CM | POA: Insufficient documentation

## 2024-05-25 DIAGNOSIS — I6782 Cerebral ischemia: Secondary | ICD-10-CM | POA: Diagnosis not present

## 2024-05-25 DIAGNOSIS — J449 Chronic obstructive pulmonary disease, unspecified: Secondary | ICD-10-CM | POA: Diagnosis not present

## 2024-05-25 DIAGNOSIS — R1310 Dysphagia, unspecified: Secondary | ICD-10-CM | POA: Diagnosis not present

## 2024-05-25 DIAGNOSIS — R531 Weakness: Secondary | ICD-10-CM | POA: Diagnosis not present

## 2024-05-25 DIAGNOSIS — Z7951 Long term (current) use of inhaled steroids: Secondary | ICD-10-CM | POA: Insufficient documentation

## 2024-05-25 DIAGNOSIS — Z79899 Other long term (current) drug therapy: Secondary | ICD-10-CM | POA: Diagnosis not present

## 2024-05-25 DIAGNOSIS — I491 Atrial premature depolarization: Secondary | ICD-10-CM | POA: Diagnosis not present

## 2024-05-25 DIAGNOSIS — Z8673 Personal history of transient ischemic attack (TIA), and cerebral infarction without residual deficits: Secondary | ICD-10-CM | POA: Insufficient documentation

## 2024-05-25 DIAGNOSIS — R42 Dizziness and giddiness: Secondary | ICD-10-CM | POA: Diagnosis present

## 2024-05-25 LAB — COMPREHENSIVE METABOLIC PANEL WITH GFR
ALT: 15 U/L (ref 0–44)
AST: 23 U/L (ref 15–41)
Albumin: 3.9 g/dL (ref 3.5–5.0)
Alkaline Phosphatase: 61 U/L (ref 38–126)
Anion gap: 9 (ref 5–15)
BUN: 19 mg/dL (ref 8–23)
CO2: 24 mmol/L (ref 22–32)
Calcium: 9.7 mg/dL (ref 8.9–10.3)
Chloride: 102 mmol/L (ref 98–111)
Creatinine, Ser: 0.85 mg/dL (ref 0.44–1.00)
GFR, Estimated: >60 mL/min
Glucose, Bld: 96 mg/dL (ref 70–99)
Potassium: 4 mmol/L (ref 3.5–5.1)
Sodium: 135 mmol/L (ref 135–145)
Total Bilirubin: 0.6 mg/dL (ref 0.0–1.2)
Total Protein: 7.3 g/dL (ref 6.5–8.1)

## 2024-05-25 LAB — URINALYSIS, ROUTINE W REFLEX MICROSCOPIC
Bilirubin Urine: NEGATIVE
Glucose, UA: NEGATIVE mg/dL
Hgb urine dipstick: NEGATIVE
Ketones, ur: NEGATIVE mg/dL
Leukocytes,Ua: NEGATIVE
Nitrite: NEGATIVE
Protein, ur: NEGATIVE mg/dL
Specific Gravity, Urine: 1.008 (ref 1.005–1.030)
pH: 7 (ref 5.0–8.0)

## 2024-05-25 LAB — CBC
HCT: 37.8 % (ref 36.0–46.0)
Hemoglobin: 12.2 g/dL (ref 12.0–15.0)
MCH: 27.9 pg (ref 26.0–34.0)
MCHC: 32.3 g/dL (ref 30.0–36.0)
MCV: 86.5 fL (ref 80.0–100.0)
Platelets: 163 K/uL (ref 150–400)
RBC: 4.37 MIL/uL (ref 3.87–5.11)
RDW: 14.1 % (ref 11.5–15.5)
WBC: 4.8 K/uL (ref 4.0–10.5)
nRBC: 0 % (ref 0.0–0.2)

## 2024-05-25 LAB — CBG MONITORING, ED: Glucose-Capillary: 92 mg/dL (ref 70–99)

## 2024-05-25 LAB — PROTIME-INR
INR: 1 (ref 0.8–1.2)
Prothrombin Time: 13.4 s (ref 11.4–15.2)

## 2024-05-25 LAB — APTT: aPTT: 26 s (ref 24–36)

## 2024-05-25 MED ORDER — LACTATED RINGERS IV BOLUS
500.0000 mL | Freq: Once | INTRAVENOUS | Status: AC
  Administered 2024-05-25: 500 mL via INTRAVENOUS

## 2024-05-25 MED ORDER — IOHEXOL 350 MG/ML SOLN
75.0000 mL | Freq: Once | INTRAVENOUS | Status: AC | PRN
  Administered 2024-05-25: 75 mL via INTRAVENOUS

## 2024-05-25 MED ORDER — MECLIZINE HCL 25 MG PO TABS
12.5000 mg | ORAL_TABLET | Freq: Once | ORAL | Status: AC
  Administered 2024-05-25: 12.5 mg via ORAL
  Filled 2024-05-25: qty 1

## 2024-05-25 MED ORDER — MECLIZINE HCL 12.5 MG PO TABS: 12.5000 mg | ORAL_TABLET | Freq: Two times a day (BID) | ORAL | 0 refills | Status: AC

## 2024-05-25 MED ORDER — DIAZEPAM 5 MG/ML IJ SOLN
2.5000 mg | Freq: Once | INTRAMUSCULAR | Status: AC
  Administered 2024-05-25: 2.5 mg via INTRAVENOUS
  Filled 2024-05-25: qty 2

## 2024-05-25 NOTE — ED Notes (Signed)
 Patient transported to MRI

## 2024-05-25 NOTE — Discharge Instructions (Addendum)
 You are seen today for dizziness, fortunately your CT scan of your brain and your MRI did not show any sign of stroke, this is likely related to an inner ear problem.  We are prescribing meclizine  for your symptoms will have you help with PCP and/or ENT.  Your blood work was all reassuring, your EKG was also reassuring.  If you have new or worsening symptoms please come back to the ER.   If the meclizine  that was prescribed for dizziness can cause dry mouth, it can also make you sleepy, please take care when taking this medication.

## 2024-05-25 NOTE — ED Provider Notes (Signed)
" °  Physical Exam  BP 123/69   Pulse 74   Temp 97.8 F (36.6 C) (Oral)   Resp 19   SpO2 97%   Physical Exam Constitutional:      Appearance: Normal appearance.  HENT:     Mouth/Throat:     Mouth: Mucous membranes are moist.  Pulmonary:     Effort: Pulmonary effort is normal.  Neurological:     Mental Status: She is alert.     Procedures  Procedures  ED Course / MDM   Clinical Course as of 05/25/24 2011  Fri May 25, 2024  1234 Temp: 97.9 F (36.6 C) Afebrile, vital stable, patient in no acute distress. [ML]  1234 CBC WNL [ML]  1234 Patient meets code stroke criteria - Patient family advised no TNK for patient - Dr. Matthews consulted for further by Dr. Charlyn [ML]  1237 Per Dr. Matthews will order MRI brain and CT angio head neck and advise further [ML]  1344 Urinalysis, Routine w reflex microscopic -Urine, Clean Catch Unremarkable [ML]  1533 CT ANGIO HEAD NECK W WO CM No acute findings - MRI showed stable, chronic changes however also nothing acute identified. [ML]  1536 Comprehensive metabolic panel with GFR WNL [ML]  1536 EKG 12-Lead Sinus rhythm [ML]  1536 Orthostatics WNL however endorsed dizziness upon standing  [ML]  1537 Patient given diazepam , meclizine , and LR bolus for symptomatic relief [ML]    Clinical Course User Index [ML] Cassidy Duwaine CROME, PA   Medical Decision Making Amount and/or Complexity of Data Reviewed Labs: ordered. Decision-making details documented in ED Course. Radiology: ordered. Decision-making details documented in ED Course. ECG/medicine tests:  Decision-making details documented in ED Course.  Risk Prescription drug management.  Signout was taken from [employer], [place].  Patient had presented with dizziness, had stroke workup which was all reassuring, she is still having vertigo symptoms so was treated with meclizine  and Valium  and fluids, signout was that patient was pending trial of ambulation and repeat evaluation.  Patient  reevaluated at approximately 8 PM, she states she was able to ambulate with assistance and feels ready to go, her son is at bedside stating he will take her back to her assisted living facility.  She is alert and oriented and well-appearing at this time.  Vies on outpatient follow-up with ENT and strict return precautions, sent home with meclizine  for symptoms.        Cassidy Ellison 05/25/24 2012    Cassidy Rosette Kirsch, MD 05/26/24 2014  "

## 2024-05-25 NOTE — ED Notes (Signed)
 RN has asked MD if he wants to make the pt a stroke code, he reports no, not at this time. RN will clarify how often he would like [facility] then

## 2024-05-25 NOTE — ED Triage Notes (Signed)
 Pt BIB EMS living at [facility]. Pt reports was getting up to walk to the salon, when pt started to feel dizzy, and had a wave of nausea , pt sat down, and was soon walked back to her room, EMS called. Pt noted to be positive for Othrostatic vitals , decreasing by 20 percent. Pt reports dizziness is prompted by movement. Pt also c/o of urine urgency but with little urine output x last night.

## 2024-05-25 NOTE — ED Provider Notes (Signed)
 " [place] [facility] AT [facility] Provider Note   CSN: [id number] Arrival date & time: 05/25/24  1131     Patient presents with: Urinary Frequency and Dizziness   Cassidy Ellison is a very pleasant 89 y.o. female with a history of hypertension, hyperlipidemia, GERD, TIA, mild cognitive impairment, and COPD who presents to the ED with dizziness that began early this afternoon. The patient states that she was in the salon at the nursing facility in which she resides where she had her head in the shampoo bowl getting her hair washed - she stated that she then stood up and became very dizzy.  States that she felt unsteady on her feet and the room was spinning. She states that the dizziness continued until after her hair appointment and she was walked back to her room where she contacted her daughter to let her know that she was not feeling right.  The patient states that she has never had anything like this happen before. The patient reports no visual disturbances, headache, numbness, tingling, or any other focal deficits.  The patient states that she also has been having some urinary issues to include urgency, however that is her normal and she does not believe that that is related.  The patient's daughter presents for visit as good historian.  The patient is in no acute distress.     Urinary Frequency  Dizziness      Prior to Admission medications  Medication Sig Start Date End Date Taking? Authorizing Provider  acetaminophen  (TYLENOL ) 325 MG tablet 2 tablet as needed 04/29/21   [provider]  albuterol  (VENTOLIN  HFA) 108 (90 Base) MCG/ACT inhaler Inhale 2 puffs into the lungs every 4 (four) hours as needed for wheezing or shortness of breath. 04/29/21   Cresenzo, John V, MD  alendronate (FOSAMAX) 70 MG tablet  12/13/18   [provider]  atorvastatin  (LIPITOR) 20 MG tablet Take 20 mg by mouth daily.  07/28/17   [provider]  benzonatate   (TESSALON ) 100 MG capsule Take 1 capsule (100 mg total) by mouth 3 (three) times daily as needed for cough. 04/29/21   Cresenzo, John V, MD  Cholecalciferol  (VITAMIN D -3) 1000 units CAPS Take 1,000 Units by mouth daily.    [provider]  Cholecalciferol  (VITAMIN D3) 25 MCG (1000 UT) CAPS 1 capsule    [provider]  docusate sodium  (COLACE) 100 MG capsule 100 mg daily as needed for mild constipation.    [provider]  famotidine (PEPCID) 20 MG tablet Take by mouth.    [provider]  loperamide (IMODIUM) 2 MG capsule Take 2 mg by mouth as needed for diarrhea or loose stools.    [provider]  metoprolol  succinate (TOPROL -XL) 50 MG 24 hr tablet Take 50 mg by mouth daily. 05/01/21   [provider]  mirtazapine (REMERON) 30 MG tablet Take 30 mg by mouth at bedtime. 07/29/21   [provider]  Multiple Vitamins-Minerals (MULTIVITAMIN WITH MINERALS) tablet Take 1 tablet by mouth daily.     [provider]  pantoprazole  (PROTONIX ) 40 MG tablet  11/17/18   [provider]  rOPINIRole  (REQUIP ) 1 MG tablet Take 1 mg by mouth at bedtime.    [provider]  umeclidinium-vilanterol (ANORO ELLIPTA ) 62.5-25 MCG/ACT AEPB Inhale 1 puff into the lungs daily. 04/30/21   Cresenzo, John V, MD  vitamin C  (ASCORBIC ACID ) 500 MG tablet Take 500 mg by mouth daily.  [provider]    Allergies: Corticosteroids, Chlorthalidone, Denture adhesive, Hydrochlorothiazide, Methoxsalen, Other, Prednisone , and Tape    Review of Systems  Genitourinary:  Positive for frequency.  Neurological:  Positive for dizziness.    Updated Vital Signs BP 136/71   Pulse 86   Temp 97.9 F (36.6 C) (Oral)   Resp 17   SpO2 93%   Physical Exam Vitals and nursing note reviewed.  Constitutional:      General: She is not in acute distress.    Appearance: Normal appearance.  HENT:     Head: Normocephalic and atraumatic.  Eyes:      General: Vision grossly intact. Gaze aligned appropriately. No visual field deficit.    Extraocular Movements: Extraocular movements intact.     Right eye: Normal extraocular motion and no nystagmus.     Left eye: Normal extraocular motion and no nystagmus.     Conjunctiva/sclera: Conjunctivae normal.     Pupils: Pupils are equal, round, and reactive to light.  Cardiovascular:     Rate and Rhythm: Normal rate and regular rhythm.     Pulses: Normal pulses.          Radial pulses are 2+ on the right side and 2+ on the left side.  Pulmonary:     Effort: Pulmonary effort is normal. No respiratory distress.     Comments: Patient has no difficulty speaking in complete sentences. Abdominal:     General: Abdomen is flat.     Palpations: Abdomen is soft.     Tenderness: There is no abdominal tenderness.  Musculoskeletal:        General: Normal range of motion.     Cervical back: Normal range of motion.     Right lower leg: No edema.     Left lower leg: No edema.  Skin:    General: Skin is warm and dry.     Capillary Refill: Capillary refill takes less than 2 seconds.  Neurological:     General: No focal deficit present.     Mental Status: She is alert. Mental status is at baseline.     Sensory: Sensation is intact.     Motor: Weakness and pronator drift present.     Coordination: Coordination is intact.     Comments: Patient alert and oriented.  Speech clear and appropriate.  No aphasia or dysarthria.   Cranial nerves III through XII intact: Motor strength in right arm is 2-5 with drift, and 5-5 in all other extremities with normal tone and no pronator drift.  Sensation intact to light touch in upper and lower extremities bilaterally.  Coordination normal with intact finger-nose.  Psychiatric:        Mood and Affect: Mood normal.     (all labs ordered are listed, but only abnormal results are displayed) Labs Reviewed  URINALYSIS, ROUTINE W REFLEX MICROSCOPIC  CBC  PROTIME-INR   APTT  COMPREHENSIVE METABOLIC PANEL WITH GFR  CBG MONITORING, ED    EKG: EKG Interpretation Date/Time:  Friday May 25 2024 12:36:51 EST Ventricular Rate:  85 PR Interval:  170 QRS Duration:  98 QT Interval:  374 QTC Calculation: 445 R Axis:   -39  Text Interpretation: Sinus rhythm Atrial premature complex Left axis deviation No acute changes No significant change since last tracing Confirmed by Charlyn Sora ([id number]) on 05/25/2024 12:56:25 PM  Radiology: CT ANGIO HEAD NECK W WO CM Result Date: 05/25/2024 EXAM: CT HEAD WITHOUT CTA HEAD AND NECK WITH AND WITHOUT 05/25/2024 02:56:19  PM TECHNIQUE: CTA of the head and neck was performed with and without the administration of 75 mL of intravenous iohexol  (OMNIPAQUE ) 350 MG/ML injection. Noncontrast CT of the head with reconstructed 2-D images are also provided for review. Multiplanar 2D and/or 3D reformatted images are provided for review. Automated exposure control, iterative reconstruction, and/or weight based adjustment of the mA/kV was utilized to reduce the radiation dose to as low as reasonably achievable. COMPARISON: Head CT 04/02/2024. MRI brain 05/25/2024. CLINICAL HISTORY: Neuro deficit, acute, stroke suspected. FINDINGS: CT HEAD: BRAIN AND VENTRICLES: No acute intracranial hemorrhage. No mass effect. No midline shift. No extra-axial fluid collection. No evidence of acute infarct. No hydrocephalus. ORBITS: No acute abnormality. SINUSES AND MASTOIDS: No acute abnormality. CTA NECK: AORTIC ARCH AND ARCH VESSELS: Atherosclerotic calcifications of the aortic arch and arch vessel origins, which remain patent. No dissection or arterial injury. No significant stenosis of the brachiocephalic or subclavian arteries. CERVICAL CAROTID ARTERIES: No dissection, arterial injury, or hemodynamically significant stenosis by NASCET criteria. CERVICAL VERTEBRAL ARTERIES: No dissection, arterial injury, or significant stenosis. LUNGS AND MEDIASTINUM:  Centrilobular emphysema in the lung apices. SOFT TISSUES: Heterogeneous enlargement of the right thyroid  lobe. No follow-up is recommended based on patient demographics. BONES: Multilevel degenerative changes of the cervical spine without high grade spinal canal stenosis. CTA HEAD: ANTERIOR CIRCULATION: Mild ectasia of the bilateral carotid siphons. Atherosclerotic calcifications of the ICA siphons without significant stenosis or aneurysm. No significant stenosis of the anterior cerebral arteries. No significant stenosis of the middle cerebral arteries. POSTERIOR CIRCULATION: No significant stenosis of the posterior cerebral arteries. No significant stenosis of the basilar artery. No significant stenosis of the vertebral arteries. No aneurysm. OTHER: No dural venous sinus thrombosis on this non-dedicated study. IMPRESSION: 1. No large vessel occlusion or hemodynamically significant stenosis in the head or neck. 2. Aortic Atherosclerosis (ICD10-I70.0). 3. Emphysema (ICD10-J43.9). Electronically signed by: Ryan Chess MD 05/25/2024 03:22 PM EST RP Workstation: [id number]   MR BRAIN WO CONTRAST Result Date: 05/25/2024 EXAM: MRI BRAIN WITHOUT CONTRAST 05/25/2024 02:29:14 PM TECHNIQUE: Multiplanar multisequence MRI of the head/brain was performed without the administration of intravenous contrast. COMPARISON: Same day CT head CT. CLINICAL HISTORY: Neuro deficit, acute, stroke suspected. FINDINGS: BRAIN AND VENTRICLES: No acute infarct. No intracranial hemorrhage. No mass. No midline shift. No hydrocephalus. The sella is unremarkable. Normal flow voids. T2 and FLAIR hyperintensity in the periventricular and subcortical white matter compatible with moderate chronic microvascular ischemic changes. There is moderate generalized parenchymal volume loss. There is slight frontal low predominance of parenchymal volume loss. ORBITS: Bilateral lens replacement. SINUSES AND MASTOIDS: Mild mucosal thickening in the ethmoid  sinuses and left sphenoid sinus. Trace fluid in the bilateral mastoid air cells. BONES AND SOFT TISSUES: Normal marrow signal. No soft tissue abnormality. IMPRESSION: 1. No acute findings. 2. Moderate chronic microvascular ischemic changes. 3. Moderate cerebral volume loss with slight frontal predominance. Electronically signed by: Donnice Mania MD 05/25/2024 02:51 PM EST RP Workstation: [id number]     Procedures   Medications Ordered in the ED  diazepam  (VALIUM ) injection 2.5 mg (has no administration in time range)  meclizine  (ANTIVERT ) tablet 12.5 mg (has no administration in time range)  lactated ringers  bolus 500 mL (has no administration in time range)  iohexol  (OMNIPAQUE ) 350 MG/ML injection 75 mL (75 mLs Intravenous Contrast Given 05/25/24 1436)  Medical Decision Making Amount and/or Complexity of Data Reviewed Labs: ordered. Decision-making details documented in ED Course. Radiology: ordered.  Risk Prescription drug management.   Patient presents to the ED for: Dizziness This involves an extensive number of treatment options Differential diagnosis includes:  CVA/TIA/ICH Vertigo Co-morbid conditions: Hypertension, TIA, mild cognitive impairment  Additional history/records obtained and reviewed: Additional history obtained from  family - daughter is present as good historian  Clinical Course as of 05/25/24 1538  Fri May 25, 2024  1234 Temp: 97.9 F (36.6 C) Afebrile, vital stable, patient in no acute distress. [ML]  1234 CBC WNL [ML]  1234 Patient meets code stroke criteria - Patient family advised no TNK for patient - Dr. Matthews consulted for further by Dr. Charlyn [ML]  1237 Per Dr. Matthews will order MRI brain and CT angio head neck and advise further [ML]  1344 Urinalysis, Routine w reflex microscopic -Urine, Clean Catch Unremarkable [ML]  1533 CT ANGIO HEAD NECK W WO CM No acute findings - MRI showed stable, chronic changes however  also nothing acute identified. [ML]  1536 Comprehensive metabolic panel with GFR WNL [ML]  1536 EKG 12-Lead Sinus rhythm [ML]  1536 Orthostatics WNL however endorsed dizziness upon standing  [ML]  1537 Patient given diazepam , meclizine , and LR bolus for symptomatic relief [ML]    Clinical Course User Index [ML] Willma Duwaine CROME, PA    Data Reviewed / Actions Taken: Labs ordered/reviewed with my independent interpretation in ED course above. Imaging ordered/reviewed with my independent interpretation in ED course above. I agree with the radiologists interpretation.  EKG ordered/reviewed with my independent interpretation in ED course above.  Key findings for the patient were reviewed with the attending physician, and ongoing clinical collaboration was maintained throughout the visit  Management / Treatments: See ED course above for medications, treatments administered, and clinical rationale.   I have reviewed the patients home medicines and have made adjustments as needed  ED Course / Reassessments: Problem List:  89 year old female presented for dizziness. Initial assessment included history, physical exam, and review of prior medical records.  On arrival, patient met code stroke criteria and was seen by Dr. Charlyn and myself. Upon discussions with patient's daughter, given patient's age, she stated they would not pursue TNK if suggested.  Neurology was contacted and stated to complete an MRI and CT angio and follow-up with neurology if any abnormalities are seen.  Laboratory studies, imaging, and EKG were obtained without acute finding.  Patient still remained dizzy upon standing with difficulty ambulating.  Vital signs were obtained and monitored, and the patient remained stable throughout the stay.  Given patient's continued dizziness, patient to receive diazepam , meclizine , and lactated ringer  bolus and be reassessed - if dizziness continues, patient to be admitted for further evaluation  and care.  At this time, patient care handed off to [facility]  Disposition: Disposition: Discharge pending - handoff to provider Rigney PA-C for further evaluation and disposition decision after medication administration reassessment.  This note was produced using Electronics Engineer. While I have reviewed and verified all clinical information, transcription errors may remain.      Final diagnoses:  None    ED Discharge Orders     None          Willma Duwaine CROME, [place] 05/25/24 1538    Charlyn Sora, MD 05/26/24 1115  "

## 2024-05-25 NOTE — ED Provider Notes (Signed)
 Patient signed out to myself at shift change pending reevaluation after fluids, medications.  Of note the patient developed dizziness this morning and did present with concerns for stroke.  Neurology was initially consulted who did recommend MRI of the brain as well as CTA.  These images have been unremarkable for any signs of acute stroke or aneurysm.  No large vessel occlusions were noted.  Patient does have ongoing dizziness with standing.  She does live at a long-term care facility. Physical Exam  BP 126/71   Pulse 97   Temp 97.7 F (36.5 C) (Oral)   Resp 19   SpO2 95%   Physical Exam  Procedures  Procedures  ED Course / MDM   Clinical Course as of 05/25/24 1656  Fri May 25, 2024  1234 Temp: 97.9 F (36.6 C) Afebrile, vital stable, patient in no acute distress. [ML]  1234 CBC WNL [ML]  1234 Patient meets code stroke criteria - Patient family advised no TNK for patient - Dr. Matthews consulted for further by Dr. Charlyn [ML]  1237 Per Dr. Matthews will order MRI brain and CT angio head neck and advise further [ML]  1344 Urinalysis, Routine w reflex microscopic -Urine, Clean Catch Unremarkable [ML]  1533 CT ANGIO HEAD NECK W WO CM No acute findings - MRI showed stable, chronic changes however also nothing acute identified. [ML]  1536 Comprehensive metabolic panel with GFR WNL [ML]  1536 EKG 12-Lead Sinus rhythm [ML]  1536 Orthostatics WNL however endorsed dizziness upon standing  [ML]  1537 Patient given diazepam , meclizine , and LR bolus for symptomatic relief [ML]    Clinical Course User Index [ML] Willma Duwaine CROME, PA   Medical Decision Making Amount and/or Complexity of Data Reviewed Labs: ordered. Decision-making details documented in ED Course. Radiology: ordered. Decision-making details documented in ED Course. ECG/medicine tests:  Decision-making details documented in ED Course.  Risk Prescription drug management.   Patient is doing well at this time and does remain  stable.  Patient is pending completion of her IV fluids and ambulation trial.  Ambulation trial will determine if patient will be able to be discharged back to her long-term care facility versus admission for intractable dizziness.  Will sign patient out to [facility], PA-C pending reevaluation and dispo.       Daralene Lonni BIRCH, PA-C 05/25/24 1757    Fredia Rosette Kirsch, MD 05/26/24 [phone]

## 2024-05-25 NOTE — ED Notes (Signed)
 Gave pt a ham sandwich and a shasta cola per PA verbally giving me consent. No other needs at this.

## 2024-05-25 NOTE — ED Notes (Signed)
 Pt able to walk roughly 50 ft with walker. Pt stated she could go further.

## 2024-06-04 ENCOUNTER — Ambulatory Visit: Admitting: Orthopedic Surgery

## 2024-06-21 ENCOUNTER — Ambulatory Visit: Admitting: Orthopedic Surgery

## 2024-06-26 ENCOUNTER — Ambulatory Visit: Admitting: Podiatry
